# Patient Record
Sex: Male | Born: 1940 | Race: White | Hispanic: No | Marital: Married | Smoking: Never smoker
Health system: Southern US, Community
[De-identification: ages and names within clinical notes are randomized; demographics above are authoritative.]

## PROBLEM LIST (undated history)

## (undated) DIAGNOSIS — I2510 Atherosclerotic heart disease of native coronary artery without angina pectoris: Secondary | ICD-10-CM

## (undated) DIAGNOSIS — I517 Cardiomegaly: Secondary | ICD-10-CM

## (undated) DIAGNOSIS — K4020 Bilateral inguinal hernia, without obstruction or gangrene, not specified as recurrent: Secondary | ICD-10-CM

## (undated) DIAGNOSIS — B069 Rubella without complication: Secondary | ICD-10-CM

## (undated) DIAGNOSIS — C801 Malignant (primary) neoplasm, unspecified: Secondary | ICD-10-CM

## (undated) DIAGNOSIS — M1990 Unspecified osteoarthritis, unspecified site: Secondary | ICD-10-CM

## (undated) DIAGNOSIS — E785 Hyperlipidemia, unspecified: Secondary | ICD-10-CM

## (undated) DIAGNOSIS — I700 Atherosclerosis of aorta: Secondary | ICD-10-CM

## (undated) DIAGNOSIS — I499 Cardiac arrhythmia, unspecified: Secondary | ICD-10-CM

## (undated) DIAGNOSIS — M459 Ankylosing spondylitis of unspecified sites in spine: Secondary | ICD-10-CM

## (undated) DIAGNOSIS — I10 Essential (primary) hypertension: Secondary | ICD-10-CM

## (undated) DIAGNOSIS — K219 Gastro-esophageal reflux disease without esophagitis: Secondary | ICD-10-CM

## (undated) DIAGNOSIS — D126 Benign neoplasm of colon, unspecified: Secondary | ICD-10-CM

## (undated) DIAGNOSIS — B019 Varicella without complication: Secondary | ICD-10-CM

## (undated) DIAGNOSIS — G4730 Sleep apnea, unspecified: Secondary | ICD-10-CM

## (undated) DIAGNOSIS — N200 Calculus of kidney: Secondary | ICD-10-CM

## (undated) DIAGNOSIS — K449 Diaphragmatic hernia without obstruction or gangrene: Secondary | ICD-10-CM

## (undated) DIAGNOSIS — Z87442 Personal history of urinary calculi: Secondary | ICD-10-CM

## (undated) DIAGNOSIS — K8020 Calculus of gallbladder without cholecystitis without obstruction: Secondary | ICD-10-CM

## (undated) DIAGNOSIS — I4819 Other persistent atrial fibrillation: Secondary | ICD-10-CM

## (undated) DIAGNOSIS — J189 Pneumonia, unspecified organism: Secondary | ICD-10-CM

## (undated) HISTORY — DX: Calculus of gallbladder without cholecystitis without obstruction: K80.20

## (undated) HISTORY — DX: Ankylosing spondylitis of unspecified sites in spine: M45.9

## (undated) HISTORY — DX: Calculus of kidney: N20.0

## (undated) HISTORY — DX: Hyperlipidemia, unspecified: E78.5

## (undated) HISTORY — DX: Essential (primary) hypertension: I10

## (undated) HISTORY — PX: OTHER SURGICAL HISTORY: SHX169

## (undated) HISTORY — DX: Benign neoplasm of colon, unspecified: D12.6

## (undated) HISTORY — PX: UPPER GI ENDOSCOPY: SHX6162

## (undated) HISTORY — DX: Gastro-esophageal reflux disease without esophagitis: K21.9

## (undated) HISTORY — DX: Other persistent atrial fibrillation: I48.19

## (undated) HISTORY — PX: KNEE SURGERY: SHX244

## (undated) HISTORY — DX: Rubella without complication: B06.9

## (undated) HISTORY — DX: Cardiomegaly: I51.7

## (undated) HISTORY — PX: CHOLECYSTECTOMY: SHX55

## (undated) HISTORY — DX: Unspecified osteoarthritis, unspecified site: M19.90

## (undated) HISTORY — PX: ELBOW ARTHROPLASTY: SHX928

## (undated) HISTORY — DX: Varicella without complication: B01.9

## (undated) HISTORY — DX: Diaphragmatic hernia without obstruction or gangrene: K44.9

## (undated) HISTORY — PX: HAND SURGERY: SHX662

## (undated) HISTORY — DX: Sleep apnea, unspecified: G47.30

## (undated) HISTORY — PX: TONSILLECTOMY: SUR1361

---

## 1993-03-01 DIAGNOSIS — D126 Benign neoplasm of colon, unspecified: Secondary | ICD-10-CM

## 1993-03-01 HISTORY — DX: Benign neoplasm of colon, unspecified: D12.6

## 2001-03-18 ENCOUNTER — Inpatient Hospital Stay (HOSPITAL_COMMUNITY): Admission: EM | Admit: 2001-03-18 | Discharge: 2001-03-18 | Payer: Self-pay | Admitting: Emergency Medicine

## 2002-07-12 ENCOUNTER — Emergency Department (HOSPITAL_COMMUNITY): Admission: EM | Admit: 2002-07-12 | Discharge: 2002-07-12 | Payer: Self-pay

## 2003-04-22 ENCOUNTER — Encounter (INDEPENDENT_AMBULATORY_CARE_PROVIDER_SITE_OTHER): Payer: Self-pay | Admitting: *Deleted

## 2003-04-22 ENCOUNTER — Observation Stay (HOSPITAL_COMMUNITY): Admission: RE | Admit: 2003-04-22 | Discharge: 2003-04-23 | Payer: Self-pay | Admitting: Surgery

## 2003-08-13 ENCOUNTER — Emergency Department (HOSPITAL_COMMUNITY): Admission: EM | Admit: 2003-08-13 | Discharge: 2003-08-14 | Payer: Self-pay | Admitting: Emergency Medicine

## 2004-09-14 ENCOUNTER — Ambulatory Visit: Payer: Self-pay | Admitting: Family Medicine

## 2004-09-26 ENCOUNTER — Ambulatory Visit: Payer: Self-pay | Admitting: Family Medicine

## 2004-10-26 ENCOUNTER — Ambulatory Visit: Payer: Self-pay | Admitting: Family Medicine

## 2004-11-02 ENCOUNTER — Ambulatory Visit: Payer: Self-pay | Admitting: Family Medicine

## 2004-11-16 ENCOUNTER — Ambulatory Visit: Payer: Self-pay | Admitting: Family Medicine

## 2005-01-30 ENCOUNTER — Ambulatory Visit: Payer: Self-pay | Admitting: Family Medicine

## 2005-02-02 ENCOUNTER — Ambulatory Visit: Payer: Self-pay | Admitting: Cardiology

## 2005-04-12 ENCOUNTER — Observation Stay (HOSPITAL_COMMUNITY): Admission: EM | Admit: 2005-04-12 | Discharge: 2005-04-15 | Payer: Self-pay | Admitting: Emergency Medicine

## 2005-05-14 ENCOUNTER — Ambulatory Visit: Payer: Self-pay | Admitting: Family Medicine

## 2005-10-20 ENCOUNTER — Emergency Department (HOSPITAL_COMMUNITY): Admission: EM | Admit: 2005-10-20 | Discharge: 2005-10-20 | Payer: Self-pay | Admitting: Emergency Medicine

## 2005-10-22 ENCOUNTER — Ambulatory Visit: Payer: Self-pay | Admitting: Family Medicine

## 2005-11-08 ENCOUNTER — Ambulatory Visit: Payer: Self-pay | Admitting: Family Medicine

## 2006-02-04 ENCOUNTER — Ambulatory Visit: Payer: Self-pay | Admitting: Family Medicine

## 2006-03-14 ENCOUNTER — Emergency Department (HOSPITAL_COMMUNITY): Admission: EM | Admit: 2006-03-14 | Discharge: 2006-03-14 | Payer: Self-pay | Admitting: Emergency Medicine

## 2006-04-18 ENCOUNTER — Ambulatory Visit: Payer: Self-pay | Admitting: Family Medicine

## 2006-05-10 ENCOUNTER — Ambulatory Visit: Payer: Self-pay | Admitting: Family Medicine

## 2006-05-16 ENCOUNTER — Ambulatory Visit: Payer: Self-pay | Admitting: Family Medicine

## 2006-05-21 ENCOUNTER — Ambulatory Visit: Payer: Self-pay | Admitting: Licensed Clinical Social Worker

## 2006-05-28 ENCOUNTER — Ambulatory Visit: Payer: Self-pay | Admitting: Licensed Clinical Social Worker

## 2006-06-11 ENCOUNTER — Ambulatory Visit: Payer: Self-pay | Admitting: Licensed Clinical Social Worker

## 2006-06-18 ENCOUNTER — Ambulatory Visit: Payer: Self-pay | Admitting: Family Medicine

## 2006-08-21 DIAGNOSIS — R1012 Left upper quadrant pain: Secondary | ICD-10-CM

## 2006-08-29 ENCOUNTER — Ambulatory Visit: Payer: Self-pay | Admitting: Family Medicine

## 2006-08-29 LAB — CONVERTED CEMR LAB
Alkaline Phosphatase: 53 units/L (ref 39–117)
Amylase: 55 units/L (ref 27–131)
Basophils Absolute: 0.1 10*3/uL (ref 0.0–0.1)
Bilirubin, Direct: 0.1 mg/dL (ref 0.0–0.3)
Chloride: 103 meq/L (ref 96–112)
Eosinophils Absolute: 0.2 10*3/uL (ref 0.0–0.6)
GFR calc Af Amer: 56 mL/min
Glucose, Bld: 99 mg/dL (ref 70–99)
Hemoglobin: 16 g/dL (ref 13.0–17.0)
Lipase: 25 units/L (ref 11.0–59.0)
Lymphocytes Relative: 24.5 % (ref 12.0–46.0)
MCHC: 34.2 g/dL (ref 30.0–36.0)
Neutrophils Relative %: 65.4 % (ref 43.0–77.0)
Potassium: 4.2 meq/L (ref 3.5–5.1)
RBC: 5.48 M/uL (ref 4.22–5.81)
RDW: 12.9 % (ref 11.5–14.6)
Sodium: 144 meq/L (ref 135–145)

## 2006-09-02 ENCOUNTER — Ambulatory Visit: Payer: Self-pay | Admitting: Internal Medicine

## 2006-09-04 ENCOUNTER — Ambulatory Visit: Payer: Self-pay | Admitting: Family Medicine

## 2006-09-04 DIAGNOSIS — M549 Dorsalgia, unspecified: Secondary | ICD-10-CM | POA: Insufficient documentation

## 2006-09-04 DIAGNOSIS — M459 Ankylosing spondylitis of unspecified sites in spine: Secondary | ICD-10-CM | POA: Insufficient documentation

## 2006-10-07 ENCOUNTER — Ambulatory Visit: Payer: Self-pay | Admitting: Gastroenterology

## 2006-10-08 ENCOUNTER — Telehealth: Payer: Self-pay | Admitting: Family Medicine

## 2006-10-15 DIAGNOSIS — M25569 Pain in unspecified knee: Secondary | ICD-10-CM

## 2006-10-22 ENCOUNTER — Ambulatory Visit: Payer: Self-pay | Admitting: Family Medicine

## 2006-10-28 ENCOUNTER — Encounter: Payer: Self-pay | Admitting: Internal Medicine

## 2006-10-30 ENCOUNTER — Telehealth: Payer: Self-pay | Admitting: Family Medicine

## 2006-11-12 ENCOUNTER — Ambulatory Visit: Payer: Self-pay | Admitting: Gastroenterology

## 2006-11-12 ENCOUNTER — Encounter: Payer: Self-pay | Admitting: Gastroenterology

## 2007-04-25 DIAGNOSIS — G4730 Sleep apnea, unspecified: Secondary | ICD-10-CM | POA: Insufficient documentation

## 2007-04-25 DIAGNOSIS — K449 Diaphragmatic hernia without obstruction or gangrene: Secondary | ICD-10-CM | POA: Insufficient documentation

## 2007-04-25 DIAGNOSIS — M129 Arthropathy, unspecified: Secondary | ICD-10-CM | POA: Insufficient documentation

## 2007-04-25 DIAGNOSIS — I517 Cardiomegaly: Secondary | ICD-10-CM | POA: Insufficient documentation

## 2007-04-25 DIAGNOSIS — Z87442 Personal history of urinary calculi: Secondary | ICD-10-CM

## 2007-04-25 DIAGNOSIS — M461 Sacroiliitis, not elsewhere classified: Secondary | ICD-10-CM

## 2007-04-25 DIAGNOSIS — D126 Benign neoplasm of colon, unspecified: Secondary | ICD-10-CM | POA: Insufficient documentation

## 2007-04-25 DIAGNOSIS — Z8719 Personal history of other diseases of the digestive system: Secondary | ICD-10-CM

## 2007-04-25 DIAGNOSIS — I10 Essential (primary) hypertension: Secondary | ICD-10-CM | POA: Insufficient documentation

## 2007-04-25 HISTORY — DX: Personal history of other diseases of the digestive system: Z87.19

## 2007-04-25 HISTORY — DX: Personal history of urinary calculi: Z87.442

## 2007-05-13 ENCOUNTER — Telehealth (INDEPENDENT_AMBULATORY_CARE_PROVIDER_SITE_OTHER): Payer: Self-pay | Admitting: *Deleted

## 2007-05-15 ENCOUNTER — Ambulatory Visit: Payer: Self-pay | Admitting: Family Medicine

## 2007-06-27 ENCOUNTER — Telehealth: Payer: Self-pay | Admitting: Internal Medicine

## 2007-08-13 ENCOUNTER — Ambulatory Visit: Payer: Self-pay | Admitting: Family Medicine

## 2007-08-13 DIAGNOSIS — J209 Acute bronchitis, unspecified: Secondary | ICD-10-CM

## 2007-08-18 ENCOUNTER — Telehealth: Payer: Self-pay | Admitting: Internal Medicine

## 2007-08-19 ENCOUNTER — Encounter: Payer: Self-pay | Admitting: Internal Medicine

## 2007-08-19 DIAGNOSIS — R0602 Shortness of breath: Secondary | ICD-10-CM | POA: Insufficient documentation

## 2007-08-21 ENCOUNTER — Ambulatory Visit: Payer: Self-pay | Admitting: Internal Medicine

## 2007-11-29 ENCOUNTER — Telehealth: Payer: Self-pay | Admitting: Family Medicine

## 2007-12-02 ENCOUNTER — Telehealth: Payer: Self-pay | Admitting: Family Medicine

## 2008-05-05 ENCOUNTER — Ambulatory Visit: Payer: Self-pay | Admitting: Family Medicine

## 2008-05-05 DIAGNOSIS — F29 Unspecified psychosis not due to a substance or known physiological condition: Secondary | ICD-10-CM | POA: Insufficient documentation

## 2008-05-05 LAB — CONVERTED CEMR LAB
ALT: 15 units/L (ref 0–53)
AST: 21 units/L (ref 0–37)
Albumin: 3.4 g/dL — ABNORMAL LOW (ref 3.5–5.2)
BUN: 9 mg/dL (ref 6–23)
Basophils Absolute: 0 10*3/uL (ref 0.0–0.1)
Bilirubin, Direct: 0 mg/dL (ref 0.0–0.3)
CO2: 27 meq/L (ref 19–32)
Calcium: 8.5 mg/dL (ref 8.4–10.5)
Cholesterol: 165 mg/dL (ref 0–200)
GFR calc non Af Amer: 70.92 mL/min (ref >60)
Lymphocytes Relative: 20.7 % (ref 12.0–46.0)
Lymphs Abs: 1.9 10*3/uL (ref 0.7–4.0)
Monocytes Absolute: 0.6 10*3/uL (ref 0.1–1.0)
Neutro Abs: 6.6 10*3/uL (ref 1.4–7.7)
Neutrophils Relative %: 69.4 % (ref 43.0–77.0)
PSA: 2.86 ng/mL (ref 0.10–4.00)
Potassium: 3.3 meq/L — ABNORMAL LOW (ref 3.5–5.1)
RDW: 14.3 % (ref 11.5–14.6)
Total Protein: 6.2 g/dL (ref 6.0–8.3)
Triglycerides: 133 mg/dL (ref 0.0–149.0)
Uric Acid, Serum: 7.5 mg/dL (ref 4.0–7.8)

## 2008-05-06 ENCOUNTER — Ambulatory Visit: Payer: Self-pay | Admitting: Family Medicine

## 2008-05-06 DIAGNOSIS — F341 Dysthymic disorder: Secondary | ICD-10-CM | POA: Insufficient documentation

## 2008-05-07 ENCOUNTER — Telehealth: Payer: Self-pay | Admitting: Family Medicine

## 2008-06-21 ENCOUNTER — Ambulatory Visit: Payer: Self-pay | Admitting: Internal Medicine

## 2008-06-21 ENCOUNTER — Inpatient Hospital Stay (HOSPITAL_COMMUNITY): Admission: EM | Admit: 2008-06-21 | Discharge: 2008-06-23 | Payer: Self-pay | Admitting: Emergency Medicine

## 2008-09-10 ENCOUNTER — Ambulatory Visit: Payer: Self-pay | Admitting: Family Medicine

## 2008-09-13 LAB — CONVERTED CEMR LAB
AST: 19 units/L (ref 0–37)
Alkaline Phosphatase: 57 units/L (ref 39–117)
Basophils Relative: 0.1 % (ref 0.0–3.0)
CO2: 29 meq/L (ref 19–32)
Creatinine, Ser: 1.4 mg/dL (ref 0.4–1.5)
Eosinophils Relative: 2.7 % (ref 0.0–5.0)
Glucose, Bld: 93 mg/dL (ref 70–99)
HCT: 50.6 % (ref 39.0–52.0)
Hemoglobin: 16.8 g/dL (ref 13.0–17.0)
LDL Cholesterol: 106 mg/dL — ABNORMAL HIGH (ref 0–99)
Lymphocytes Relative: 29.2 % (ref 12.0–46.0)
MCV: 86.3 fL (ref 78.0–100.0)
Monocytes Absolute: 0.6 10*3/uL (ref 0.1–1.0)
Monocytes Relative: 6.6 % (ref 3.0–12.0)
Neutro Abs: 5.2 10*3/uL (ref 1.4–7.7)
Neutrophils Relative %: 61.4 % (ref 43.0–77.0)
RDW: 13.2 % (ref 11.5–14.6)
Sodium: 143 meq/L (ref 135–145)
TSH: 2.52 microintl units/mL (ref 0.35–5.50)
Total Bilirubin: 1.1 mg/dL (ref 0.3–1.2)
Total Protein: 7.1 g/dL (ref 6.0–8.3)
Triglycerides: 156 mg/dL — ABNORMAL HIGH (ref 0.0–149.0)

## 2008-09-23 ENCOUNTER — Telehealth: Payer: Self-pay | Admitting: Family Medicine

## 2008-10-01 ENCOUNTER — Ambulatory Visit: Payer: Self-pay | Admitting: Internal Medicine

## 2008-11-18 ENCOUNTER — Telehealth: Payer: Self-pay | Admitting: Family Medicine

## 2008-11-30 ENCOUNTER — Telehealth: Payer: Self-pay | Admitting: Family Medicine

## 2009-01-14 ENCOUNTER — Telehealth: Payer: Self-pay | Admitting: Family Medicine

## 2009-02-27 ENCOUNTER — Encounter: Payer: Self-pay | Admitting: Family Medicine

## 2009-03-02 ENCOUNTER — Ambulatory Visit: Payer: Self-pay | Admitting: Family Medicine

## 2009-03-02 DIAGNOSIS — L03119 Cellulitis of unspecified part of limb: Secondary | ICD-10-CM

## 2009-03-02 DIAGNOSIS — L02619 Cutaneous abscess of unspecified foot: Secondary | ICD-10-CM

## 2009-03-28 ENCOUNTER — Ambulatory Visit: Payer: Self-pay | Admitting: Family Medicine

## 2009-04-14 ENCOUNTER — Ambulatory Visit: Payer: Self-pay | Admitting: Family Medicine

## 2009-04-14 DIAGNOSIS — R946 Abnormal results of thyroid function studies: Secondary | ICD-10-CM

## 2009-04-14 LAB — CONVERTED CEMR LAB: TSH: 0.02 microintl units/mL — ABNORMAL LOW (ref 0.35–5.50)

## 2009-04-15 ENCOUNTER — Telehealth: Payer: Self-pay | Admitting: Family Medicine

## 2009-04-15 ENCOUNTER — Inpatient Hospital Stay (HOSPITAL_COMMUNITY): Admission: EM | Admit: 2009-04-15 | Discharge: 2009-04-17 | Payer: Self-pay | Admitting: Emergency Medicine

## 2009-05-06 ENCOUNTER — Telehealth: Payer: Self-pay | Admitting: Family Medicine

## 2009-05-10 ENCOUNTER — Ambulatory Visit: Payer: Self-pay | Admitting: Family Medicine

## 2009-05-10 DIAGNOSIS — E0590 Thyrotoxicosis, unspecified without thyrotoxic crisis or storm: Secondary | ICD-10-CM | POA: Insufficient documentation

## 2009-05-11 LAB — CONVERTED CEMR LAB
Basophils Absolute: 0 10*3/uL (ref 0.0–0.1)
CO2: 27 meq/L (ref 19–32)
Calcium: 10.8 mg/dL — ABNORMAL HIGH (ref 8.4–10.5)
Chloride: 108 meq/L (ref 96–112)
Eosinophils Absolute: 0.2 10*3/uL (ref 0.0–0.7)
Eosinophils Relative: 3.1 % (ref 0.0–5.0)
Free T4: 5.3 ng/dL — ABNORMAL HIGH (ref 0.6–1.6)
GFR calc non Af Amer: 58.31 mL/min (ref >60)
Glucose, Bld: 117 mg/dL — ABNORMAL HIGH (ref 70–99)
HCT: 42.4 % (ref 39.0–52.0)
Hemoglobin: 14.2 g/dL (ref 13.0–17.0)
Lymphs Abs: 2.3 10*3/uL (ref 0.7–4.0)
MCHC: 33.4 g/dL (ref 30.0–36.0)
Monocytes Absolute: 0.7 10*3/uL (ref 0.1–1.0)
Monocytes Relative: 12.5 % — ABNORMAL HIGH (ref 3.0–12.0)
Platelets: 177 10*3/uL (ref 150.0–400.0)
Potassium: 4.2 meq/L (ref 3.5–5.1)
RDW: 13.1 % (ref 11.5–14.6)
WBC: 5.4 10*3/uL (ref 4.5–10.5)

## 2009-05-13 ENCOUNTER — Telehealth: Payer: Self-pay | Admitting: Family Medicine

## 2009-05-16 ENCOUNTER — Ambulatory Visit: Payer: Self-pay | Admitting: Family Medicine

## 2009-05-16 DIAGNOSIS — R319 Hematuria, unspecified: Secondary | ICD-10-CM

## 2009-05-16 LAB — CONVERTED CEMR LAB
Bilirubin Urine: NEGATIVE
Glucose, Urine, Semiquant: NEGATIVE
INR: 5.6
Protein, U semiquant: 30
Prothrombin Time: 28.6 s
Specific Gravity, Urine: 1.02
Urobilinogen, UA: 0.2
WBC Urine, dipstick: NEGATIVE

## 2009-05-18 ENCOUNTER — Ambulatory Visit: Payer: Self-pay | Admitting: Family Medicine

## 2009-05-18 ENCOUNTER — Encounter (HOSPITAL_COMMUNITY): Admission: RE | Admit: 2009-05-18 | Discharge: 2009-08-02 | Payer: Self-pay | Admitting: Family Medicine

## 2009-05-19 ENCOUNTER — Telehealth: Payer: Self-pay | Admitting: Family Medicine

## 2009-06-01 DIAGNOSIS — R350 Frequency of micturition: Secondary | ICD-10-CM | POA: Insufficient documentation

## 2009-06-03 ENCOUNTER — Ambulatory Visit: Payer: Self-pay | Admitting: Family Medicine

## 2009-06-30 ENCOUNTER — Ambulatory Visit: Payer: Self-pay | Admitting: Family Medicine

## 2009-07-05 LAB — CONVERTED CEMR LAB
BUN: 20 mg/dL (ref 6–23)
CO2: 31 meq/L (ref 19–32)
GFR calc non Af Amer: 59.33 mL/min (ref >60)
T3, Free: 6.8 pg/mL — ABNORMAL HIGH (ref 2.3–4.2)

## 2009-11-10 ENCOUNTER — Ambulatory Visit: Payer: Self-pay | Admitting: Family Medicine

## 2009-11-22 ENCOUNTER — Telehealth: Payer: Self-pay | Admitting: Family Medicine

## 2010-01-02 ENCOUNTER — Ambulatory Visit: Payer: Self-pay | Admitting: Family Medicine

## 2010-01-04 ENCOUNTER — Telehealth: Payer: Self-pay | Admitting: Family Medicine

## 2010-01-16 ENCOUNTER — Telehealth: Payer: Self-pay | Admitting: Family Medicine

## 2010-02-15 ENCOUNTER — Telehealth: Payer: Self-pay | Admitting: Family Medicine

## 2010-03-02 NOTE — Assessment & Plan Note (Signed)
Summary: fu per doc/njr   History of Present Illness: [name] Woods is a 70 year old male comes in today for follow-up of hematuria.  He has underlying hyperthyroidism......... scan and uptake today.  We saw him two days ago with massive unprovoked, hematuria. his INR was elevated, therefore, we stopped his Coumadin.  His INR today is 1.8.  He states he still having hematuria.  Pulse is 70 and regular  Allergies: No Known Drug Allergies  Past History:  Past medical, surgical, family and social histories (including risk factors) reviewed for relevance to current acute and chronic problems.  Past Medical History: Reviewed history from 04/25/2007 and no changes required. 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 Cholecystectomy   Family History: Reviewed history and no changes required.  Social History: Reviewed history from 05/05/2008 and no changes required. Retired Married Alcohol use-no Drug use-no Regular exercise-no  Review of Systems      See HPI  Physical Exam  General:  Well-developed,well-nourished,in no acute distress; alert,appropriate and cooperative throughout examination Heart:  70 and regular   Impression & Recommendations:  Problem # 1:  HEMATURIA UNSPECIFIED (ICD-599.70) Assessment Improved  Complete Medication List: 1)  Cyclobenzaprine Hcl 10 Mg Tabs (Cyclobenzaprine hcl) .Marland Kitchen.. 1 qd 2)  Hydrocodone-acetaminophen 7.5-750 Mg  Tabs (Hydrocodone-acetaminophen) .... Take one tab three times a day 3)  Cardizem La 300 Mg Tb24 (Diltiazem hcl coated beads) .Marland Kitchen.. 1qd 4)  Zestril 20 Mg Tabs (Lisinopril) .Marland Kitchen.. 1qd 5)  Colchicine 0.6 Mg Tabs (Colchicine) .... Prn 6)  Cialis 20 Mg Tabs (Tadalafil) .Marland Kitchen.. 1 tab as needed 7)  Aspirin Ec 325 Mg Tbec (Aspirin) .... Take one tablet daily 8)  Ultram 50 Mg Tabs (Tramadol hcl) .Marland Kitchen.. 1-2 by mouth every 6 hours as needed pain 9)  Nadolol 20 Mg Tabs (Nadolol) .... One by mouth daily 10)  Celexa 20 Mg Tabs (Citalopram hydrobromide) .Marland Kitchen.. 1 tab @ bedtime 11)  Ativan 0.5 Mg Tabs (Lorazepam) .... Take 1 tablet by mouth two times a day 12)  Warfarin Sodium 5 Mg Tabs (Warfarin sodium) .... Take one tab once daily 13)  Multaq 400 Mg Tabs (Dronedarone hcl) .... Take one tab two times a day  Other Orders: Protime (82956OZ) Fingerstick (30865)  Patient Instructions: 1)  continue to not take your Coumadin.  I will call you and I get the report from your thyroid uptake and scan.  Laboratory Results   Blood Tests   Date/Time Recieved: May 18, 2009 8:24 AM  Date/Time Reported: May 18, 2009 8:24 AM   PT: 16.4 s   (Normal Range: 10.6-13.4)  INR: 1.8   (Normal Range: 0.88-1.12   Therap INR: 2.0-3.5) Comments: Wynona Canes, CMA  May 18, 2009 8:24 AM       ANTICOAGULATION RECORD PREVIOUS REGIMEN & LAB RESULTS Anticoagulation Diagnosis:  427.31,V58.61,V58.83 on  05/16/2009 Previous INR Goal Range:  2.0-3.0 on  05/16/2009 Previous INR:  5.6 on  05/16/2009 Previous Coumadin Dose(mg):  2.5mg  on m,w,f 5mg  other days on  05/16/2009   NEW REGIMEN & LAB RESULTS Current INR: 1.8 Regimen:   (no change)  MEDICATIONS CYCLOBENZAPRINE HCL 10 MG TABS (CYCLOBENZAPRINE HCL) 1 qd HYDROCODONE-ACETAMINOPHEN 7.5-750 MG  TABS (HYDROCODONE-ACETAMINOPHEN) take one tab three times a day CARDIZEM LA 300 MG  TB24 (DILTIAZEM HCL COATED BEADS) 1qd ZESTRIL 20 MG  TABS (LISINOPRIL) 1qd COLCHICINE 0.6 MG   TABS (COLCHICINE) prn CIALIS 20 MG  TABS (TADALAFIL) 1 tab as needed ASPIRIN EC 325 MG  TBEC (ASPIRIN) take one tablet daily ULTRAM 50 MG TABS (TRAMADOL HCL) 1-2 by mouth every 6 hours as needed pain NADOLOL 20 MG TABS (NADOLOL) one by mouth daily CELEXA 20 MG TABS (CITALOPRAM HYDROBROMIDE) 1 tab @ bedtime ATIVAN 0.5 MG TABS (LORAZEPAM) Take 1 tablet by mouth two times a day WARFARIN SODIUM 5 MG TABS (WARFARIN SODIUM) take one tab once daily MULTAQ 400 MG TABS (DRONEDARONE HCL) take one tab two times a day   Anticoagulation Visit Questionnaire      Coumadin dose missed/changed:  No      Abnormal Bleeding Symptoms:  No   Any diet changes including alcohol intake, vegetables or greens since the last visit:  No Any illnesses or hospitalizations since the last visit:  No Any signs of clotting since the last visit (including chest discomfort, dizziness, shortness of breath, arm tingling, slurred speech, swelling or redness in leg):  No   Appended Document: fu per doc/njr     Allergies: No Known Drug Allergies   Complete Medication List: 1)  Cyclobenzaprine Hcl 10 Mg Tabs (Cyclobenzaprine hcl) .Marland Kitchen.. 1 qd 2)  Hydrocodone-acetaminophen 7.5-750 Mg Tabs (Hydrocodone-acetaminophen) .... Take one tab three times a day 3)  Cardizem La 300 Mg Tb24 (Diltiazem hcl coated beads) .Marland Kitchen.. 1qd 4)  Zestril 20 Mg Tabs (Lisinopril) .Marland Kitchen.. 1qd 5)  Colchicine 0.6 Mg Tabs (Colchicine) .... Prn 6)  Cialis 20 Mg Tabs (Tadalafil) .Marland Kitchen.. 1 tab as needed 7)  Aspirin Ec 325 Mg Tbec (Aspirin) .... Take one tablet daily 8)  Ultram 50 Mg Tabs (Tramadol hcl) .Marland Kitchen.. 1-2 by mouth every 6 hours as needed pain 9)  Nadolol 20 Mg Tabs (Nadolol) .... One by mouth daily 10)  Celexa 20 Mg Tabs (Citalopram hydrobromide) .Marland Kitchen.. 1 tab @ bedtime 11)  Ativan 0.5 Mg Tabs (Lorazepam) .... Take 1 tablet by mouth two times a day 12)  Warfarin Sodium 5 Mg Tabs (Warfarin sodium) .... Take one tab once daily 13)  Multaq 400 Mg Tabs  (Dronedarone hcl) .... Take one tab two times a day  Other Orders: UA Dipstick w/o Micro (manual) (16109)   Laboratory Results   Urine Tests  Date/Time Received: May 18, 2009   Routine Urinalysis   Color: yellow Appearance: Clear Glucose: negative   (Normal Range: Negative) Bilirubin: negative   (Normal Range: Negative) Ketone: negative   (Normal Range: Negative) Spec. Gravity: 1.015   (Normal Range: 1.003-1.035) Blood: moderate   (Normal Range: Negative) pH: 6.0   (Normal Range: 5.0-8.0) Protein: negative   (Normal Range: Negative) Urobilinogen: 0.2   (Normal Range: 0-1) Nitrite: negative   (Normal Range: Negative) Leukocyte Esterace: negative   (Normal Range: Negative)    Comments: Kern Reap CMA Duncan Dull)  May 18, 2009 8:52 AM

## 2010-03-02 NOTE — Assessment & Plan Note (Signed)
Summary: 1 MONTH FUP//CCM   Vital Signs:  Patient profile:   70 year old male Weight:      186 pounds Temp:     98.0 degrees F oral BP sitting:   172 / 68  (left arm) Cuff size:   regular  Vitals Entered By: Kern Reap CMA Duncan Dull) (June 30, 2009 10:21 AM) CC: follow-up visit   CC:  follow-up visit.  History of Present Illness: [name] Woods is a 70 year old male, who comes in today for evaluation of 3 problems.  He had medication-induced hyperthyroidism.  He is currently asymptomatic feels well.  No rapid heart rate.  No anxiety or nervousness.  Will check thyroid studies today.  He has underlying hypertension.  We increased his lisinopril from 20 mg daily to 40 a month ago.  BP still running high 172/68 year 150/60 at home.  The paroxysmal atrial fibrillation is asymptomatic.  No arrhythmia.  He stopped his Coumadin.  He wants a refill on the Vicodin.  He takes one tablet 3 times a day because he has ankylosing spondylitis and chronic low back pain.  He states the medication does not make him sleepy  Allergies (verified): No Known Drug Allergies  Review of Systems      See HPI  Physical Exam  General:  Well-developed,well-nourished,in no acute distress; alert,appropriate and cooperative throughout examination Lungs:  Normal respiratory effort, chest expands symmetrically. Lungs are clear to auscultation, no crackles or wheezes. Heart:  Normal rate and regular rhythm. S1 and S2 normal without gallop, murmur, click, rub or other extra sounds.   Impression & Recommendations:  Problem # 1:  HYPERTHYROIDISM (ICD-242.90) Assessment Improved  The following medications were removed from the medication list:    Nadolol 20 Mg Tabs (Nadolol) ..... One by mouth daily  Orders: Venipuncture (16109) TLB-BMP (Basic Metabolic Panel-BMET) (80048-METABOL) TLB-TSH (Thyroid Stimulating Hormone) (84443-TSH) TLB-T4 (Thyrox), Free [id number]) TLB-T3, Free (Triiodothyronine)  (84481-T3FREE)  Problem # 2:  HYPERTENSION (ICD-401.9) Assessment: Deteriorated  The following medications were removed from the medication list:    Zestril 20 Mg Tabs (Lisinopril) .Marland Kitchen... 1qd    Nadolol 20 Mg Tabs (Nadolol) ..... One by mouth daily His updated medication list for this problem includes:    Cardizem La 300 Mg Tb24 (Diltiazem hcl coated beads) .Marland Kitchen... 1qd    Lisinopril 40 Mg Tabs (Lisinopril) .Marland Kitchen... 1 & 1/2 qam  Problem # 3:  ANKYLOSING SPONDYLITIS (ICD-720.0) Assessment: Unchanged  Complete Medication List: 1)  Cyclobenzaprine Hcl 10 Mg Tabs (Cyclobenzaprine hcl) .Marland Kitchen.. 1 qd 2)  Hydrocodone-acetaminophen 7.5-750 Mg Tabs (Hydrocodone-acetaminophen) .... Take one tab three times a day 3)  Cardizem La 300 Mg Tb24 (Diltiazem hcl coated beads) .Marland Kitchen.. 1qd 4)  Cialis 20 Mg Tabs (Tadalafil) .Marland Kitchen.. 1 tab as needed 5)  Aspirin Ec 325 Mg Tbec (Aspirin) .... Take one tablet daily 6)  Celexa 20 Mg Tabs (Citalopram hydrobromide) .Marland Kitchen.. 1 tab @ bedtime 7)  Ativan 0.5 Mg Tabs (Lorazepam) .... Take 1 tablet by mouth two times a day 8)  Warfarin Sodium 5 Mg Tabs (Warfarin sodium) .... Take one tab once daily 9)  Multaq 400 Mg Tabs (Dronedarone hcl) .... Take one tab two times a day 10)  Prednisone 20 Mg Tabs (Prednisone) .... Use as directed 11)  Lisinopril 40 Mg Tabs (Lisinopril) .Marland Kitchen.. 1 & 1/2 qam  Patient Instructions: 1)  continue your current medications.  I will call you on initial lab work back Prescriptions: HYDROCODONE-ACETAMINOPHEN 7.5-750 MG TABS (HYDROCODONE-ACETAMINOPHEN) take one tab three times  a day  #100 x 5   Entered and Authorized by:   Roderick Pee MD   Signed by:   Roderick Pee MD on 06/30/2009   Method used:   Print then Give to Patient   RxID:   [id number] HYDROCODONE-ACETAMINOPHEN 7.5-750 MG TABS (HYDROCODONE-ACETAMINOPHEN) take one tab three times a day  #100 x 5   Entered and Authorized by:   Roderick Pee MD   Signed by:   Roderick Pee MD on 06/30/2009    Method used:   Print then Give to Patient   RxID:   [id number] LISINOPRIL 40 MG TABS (LISINOPRIL) 1 & 1/2 qam  #150 x 3   Entered and Authorized by:   Roderick Pee MD   Signed by:   Roderick Pee MD on 06/30/2009   Method used:   Electronically to        [facility] [address] #[id number]* (retail)       [address] [place], [place]  [address]       Ph: [phone]       Fax: [phone]   RxID:   [id number] CARDIZEM LA 300 MG  TB24 (DILTIAZEM HCL COATED BEADS) 1qd  #100 x 3   Entered and Authorized by:   Roderick Pee MD   Signed by:   Roderick Pee MD on 06/30/2009   Method used:   Electronically to        [facility] [address] [facility] [address] #[id number]* (retail)       [address] [place], [place]  [address]       Ph: [phone]       Fax: [phone]   RxID:   [id number]

## 2010-03-02 NOTE — Assessment & Plan Note (Signed)
Summary: rapid weight loss/shaky/?thyroid problem/cjr   Vital Signs:  Patient profile:   70 year old male Weight:      195 pounds Temp:     97.7 degrees F oral BP sitting:   170 / 60  (left arm) Cuff size:   regular  Vitals Entered By: Kern Reap CMA Duncan Dull) (May 10, 2009 9:17 AM) CC: throid concerns, weight loss Is Patient Diabetic? No Pain Assessment Patient in pain? no        CC:  throid concerns and weight loss.  History of Present Illness: [name] Woods is a 70 year old male, who comes in today for evaluation of hyperthyroidism.  He was recently admitted to the hospital for atrial fibrillation.  At that time.  His TSH was .006.  It was felt to be a medication related phenomena.  Therefore, the Amioderone was discontinued.  He is still in regular rhythm, however, he complains of tremulous nervousness, and continue weight loss.  He is down to 195.  Allergies: No Known Drug Allergies  Past History:  Past medical, surgical, family and social histories (including risk factors) reviewed for relevance to current acute and chronic problems.  Past Medical History: Reviewed history from 04/25/2007 and no changes required. 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 Cholecystectomy   Family History: Reviewed history and no changes required.  Social History: Reviewed history from 05/05/2008 and no changes required. Retired Married Alcohol  use-no Drug use-no Regular exercise-no  Review of Systems      See HPI  Physical Exam  General:  Well-developed,well-nourished,in no acute distress; alert,appropriate and cooperative throughout examination Neck:  No deformities, masses, or tenderness noted. Lungs:  Normal respiratory effort, chest expands symmetrically. Lungs are clear to auscultation, no crackles or wheezes. Heart:  Normal rate and regular rhythm. S1 and S2 normal without gallop, murmur, click, rub or other extra sounds.   Impression & Recommendations:  Problem # 1:  HYPERTHYROIDISM (ICD-242.90) Assessment New  His updated medication list for this problem includes:    Nadolol 20 Mg Tabs (Nadolol) ..... One by mouth daily  Orders: Venipuncture (16109) TLB-BMP (Basic Metabolic Panel-BMET) (80048-METABOL) TLB-CBC Platelet - w/Differential (85025-CBCD) TLB-TSH (Thyroid Stimulating Hormone) (84443-TSH) TLB-T4 (Thyrox), Free [phone]) TLB-T3, Free (Triiodothyronine) (84481-T3FREE)  Complete Medication List: 1)  Cyclobenzaprine Hcl 10 Mg Tabs (Cyclobenzaprine hcl) .Marland Kitchen.. 1 qd 2)  Hydrocodone-acetaminophen 7.5-750 Mg Tabs (Hydrocodone-acetaminophen) .... Take one tab three times a day 3)  Cardizem La 300 Mg Tb24 (Diltiazem hcl coated beads) .Marland Kitchen.. 1qd 4)  Zestril 20 Mg Tabs (Lisinopril) .Marland Kitchen.. 1qd 5)  Colchicine 0.6 Mg Tabs (Colchicine) .... Prn 6)  Cialis 20 Mg Tabs (Tadalafil) .Marland Kitchen.. 1 tab as needed 7)  Aspirin Ec 325 Mg Tbec (Aspirin) .... Take one tablet daily 8)  Ultram 50 Mg Tabs (Tramadol hcl) .Marland Kitchen.. 1-2 by mouth every 6 hours as needed pain 9)  Nadolol 20 Mg Tabs (Nadolol) .... One by mouth daily 10)  Celexa 20 Mg Tabs (Citalopram hydrobromide) .Marland Kitchen.. 1 tab @ bedtime 11)  Ativan 0.5 Mg Tabs (Lorazepam) .... Take 1 tablet by mouth two times a day 12)  Warfarin Sodium 5 Mg Tabs (Warfarin sodium) .... Take one tab once  daily 13)  Multaq 400 Mg Tabs (Dronedarone hcl) .... Take one tab two times a day  Patient  Instructions: 1)  continue current medications. 2)  Once we get your blood tests results, then we will call you and get set up for further studies

## 2010-03-02 NOTE — Assessment & Plan Note (Signed)
Summary: acute knee pain//alp   Vital Signs:  Patient profile:   70 year old male Height:      72 inches Weight:      201 pounds BMI:     27.36 Temp:     97.9 degrees F oral BP sitting:   140 / 78  (left arm) Cuff size:   regular  Vitals Entered By: Kern Reap CMA Duncan Dull) (January 02, 2010 1:05 PM) CC: right knee pain Is Patient Diabetic? No Pain Assessment Patient in pain? yes     Location: knee   CC:  right knee pain.  History of Present Illness: [name] Woods is a 70 year old, married male, nonsmoker, who comes in today for evaluation of pain in his right knee for 3 days.  He has had two previous knee surgeries on that knee for torn cartilages.  He played football at [employer]  Allergies: No Known Drug Allergies  Past History:  Past medical, surgical, family and social histories (including risk factors) reviewed for relevance to current acute and chronic problems.  Past Medical History: Reviewed history from 04/25/2007 and no changes required. 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 Cholecystectomy   Family History: Reviewed history and no changes required.  Social History: Reviewed history from 05/05/2008 and no changes required. Retired Married Alcohol use-no Drug use-no Regular exercise-no  Review of Systems      See HPI  Physical Exam  General:  Well-developed,well-nourished,in no acute distress;  alert,appropriate and cooperative throughout examination Msk:  the right knee is swollen and numb, and he able to extend it to pain on the lateral portion of the knee at the joint cartilage   Impression & Recommendations:  Problem # 1:  KNEE PAIN, RIGHT, ACUTE (ICD-719.46) Assessment New  His updated medication list for this problem includes:    Cyclobenzaprine Hcl 10 Mg Tabs (Cyclobenzaprine hcl) .Marland Kitchen... 1 qd    Hydrocodone-acetaminophen 7.5-750 Mg Tabs (Hydrocodone-acetaminophen) .Marland Kitchen... Take one  tab by mouth three times a day as needed for severe pain    Aspirin Ec 325 Mg Tbec (Aspirin) .Marland Kitchen... Take one tablet daily  Complete Medication List: 1)  Cyclobenzaprine Hcl 10 Mg Tabs (Cyclobenzaprine hcl) .Marland Kitchen.. 1 qd 2)  Hydrocodone-acetaminophen 7.5-750 Mg Tabs (Hydrocodone-acetaminophen) .... Take one  tab by mouth three times a day as needed for severe pain 3)  Cardizem La 300 Mg Tb24 (Diltiazem hcl coated beads) .Marland Kitchen.. 1qd 4)  Cialis 20 Mg Tabs (Tadalafil) .Marland Kitchen.. 1 tab as needed 5)  Aspirin Ec 325 Mg Tbec (Aspirin) .... Take one tablet daily 6)  Ativan 0.5 Mg Tabs (Lorazepam) .... Take 1 tablet by mouth two times a day 7)  Multaq 400 Mg Tabs (Dronedarone hcl) .... Take one tab two times a day 8)  Lisinopril 40 Mg Tabs (Lisinopril) .Marland Kitchen.. 1 & 1/2 qam  Patient Instructions: 1)  elevation and ice for 30 minutes 4 times a day.  Also, use the crutches as needed.  Also, the pain pills one half to one tablet 4 times a day as needed for severe pain. 2)  Call [facility] and asked to be seen by Dr. Cleophas Dunker, or Dr. Madelon Lips, or Dr.  Lucey. this week Prescriptions: HYDROCODONE-ACETAMINOPHEN 7.5-750 MG TABS (HYDROCODONE-ACETAMINOPHEN) take one  tab by mouth three times a day as needed for severe pain  #100 x 1   Entered and Authorized by:   Roderick Pee MD   Signed by:   Roderick Pee MD on 01/02/2010   Method used:   Print then Give to Patient   RxID:   [id number]    Orders Added: 1)  Est. Patient  Level III [56213]

## 2010-03-02 NOTE — Progress Notes (Signed)
Summary:  fyi  Phone Note Call from Patient Call back at [phone]   Caller: Patient Call For: Roderick Pee MD Summary of Call: pt would like lab results Initial call taken by: Heron Sabins,  April 15, 2009 12:58 PM  Follow-up for Phone Call        Patient advised to call [phone] Follow-up by: Kern Reap CMA Duncan Dull),  April 15, 2009 3:23 PM

## 2010-03-02 NOTE — Letter (Signed)
Summary: [facility]  [facility]   Imported By: Maryln Gottron 03/01/2009 13:45:33  _____________________________________________________________________  External Attachment:    Type:   Image     Comment:   External Document

## 2010-03-02 NOTE — Progress Notes (Signed)
Summary: fyi  Phone Note Call from Patient Call back at [facility] Phone [phone]   Caller: Patient Summary of Call: Pt called req to get lab results. Please call back asap.  Initial call taken by: Lucy Antigua,  May 13, 2009 8:16 AM  Follow-up for Phone Call         please call lab tests confirm hyperthyroidism, when is  his scan and uptake scheduled for???????????? Follow-up by: Roderick Pee MD,  May 13, 2009 10:39 AM  Additional Follow-up for Phone Call Additional follow up Details #1::        left message on machine for patient about lab results. patient is also aware of appointment for thyroid scan/uptake. Additional Follow-up by: Kern Reap CMA Duncan Dull),  May 13, 2009 10:45 AM

## 2010-03-02 NOTE — Assessment & Plan Note (Signed)
Summary: TSH ELEVATED / WT LOSS CONCERNS //RS rsc appt time/njr   Vital Signs:  Patient profile:   70 year old male Weight:      199 pounds Temp:     98.0 degrees F Pulse rate:   68 / minute Pulse rhythm:   irregular BP sitting:   160 / 80  (left arm) Cuff size:   regular  Vitals Entered By: Kern Reap CMA Duncan Dull) (April 14, 2009 9:03 AM)  Reason for Visit weight loss and lab concerns  History of Present Illness: [name] Woods is a 70 year old male, who comes in today accompanied by his son for evaluation of abnormal lab tests.  He brings laboratory data from Dr. Gery Pray his cardiologist.  TSH is .[phone].  Previous TSH by me in August was normal at 2.5 2............. the only change in his medication is that they have increased his amioderone .  He feels shaky nervous and is lost a few pounds.  Review of systems otherwise negative.  Cardiac-wise, stable.  Pulse 68 and irregularly, irregular  Allergies: No Known Drug Allergies  Past History:  Past medical, surgical, family and social histories (including risk factors) reviewed for relevance to current acute and chronic problems.  Past Medical History: Reviewed history from 04/25/2007 and no changes required. 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 Cholecystectomy   Family History: Reviewed history and no changes required.  Social History: Reviewed history from 05/05/2008 and no changes  required. Retired Married Alcohol use-no Drug use-no Regular exercise-no  Review of Systems      See HPI  Physical Exam  General:  Well-developed,well-nourished,in no acute distress; alert,appropriate and cooperative throughout examination Neck:  No deformities, masses, or tenderness noted.   Problems:  Medical Problems Added: 1)  Dx of Thyroid Function Test, Abnormal  (ICD-794.5)  Impression & Recommendations:  Problem # 1:  THYROID FUNCTION TEST, ABNORMAL (ICD-794.5) Assessment New  Orders: Venipuncture (09604) TLB-TSH (Thyroid Stimulating Hormone) (84443-TSH) TLB-T4 (Thyrox), Free [phone]) TLB-T3, Free (Triiodothyronine) (84481-T3FREE)  Complete Medication List: 1)  Cyclobenzaprine Hcl 10 Mg Tabs (Cyclobenzaprine hcl) .Marland Kitchen.. 1 qd 2)  Hydrocodone-acetaminophen 7.5-750 Mg Tabs (Hydrocodone-acetaminophen) .... Take one tab three times a day 3)  Cardizem La 300 Mg Tb24 (Diltiazem hcl coated beads) .Marland Kitchen.. 1qd 4)  Amiodarone Hcl 200 Mg Tabs (Amiodarone hcl) .... 1/2 tab by mouth daily 5)  Zestril 20 Mg Tabs (Lisinopril) .Marland Kitchen.. 1qd 6)  Colchicine 0.6 Mg Tabs (Colchicine) .... Prn 7)  Cialis 20 Mg Tabs (Tadalafil) .Marland Kitchen.. 1 tab as needed 8)  Aspirin Ec 325 Mg Tbec (Aspirin) .... Take one tablet daily 9)  Ultram 50 Mg Tabs (Tramadol hcl) .Marland Kitchen.. 1-2 by mouth every 6 hours as needed pain 10)  Nadolol 20 Mg Tabs (Nadolol) .... One by mouth daily 11)  Celexa 20 Mg Tabs (Citalopram hydrobromide) .Marland Kitchen.. 1 tab @ bedtime 12)  Ativan 0.5 Mg Tabs (Lorazepam) .... Take 1 tablet by mouth two times a day 13)  Warfarin Sodium 5 Mg Tabs (Warfarin sodium) .... Take one tab once daily  Patient Instructions: 1)  I will call you I get your lab work back

## 2010-03-02 NOTE — Assessment & Plan Note (Signed)
Summary: FLU SHOT//SLM   Nurse Visit   Allergies: No Known Drug Allergies  Orders Added: 1)  Flu Vaccine 18yrs + MEDICARE PATIENTS [Q2039] 2)  Administration Flu vaccine - MCR [G0008]  Flu Vaccine Consent Questions     Do you have a history of severe allergic reactions to this vaccine? no    Any prior history of allergic reactions to egg and/or gelatin? no    Do you have a sensitivity to the preservative Thimersol? no    Do you have a past history of Guillan-Barre Syndrome? no    Do you currently have an acute febrile illness? no    Have you ever had a severe reaction to latex? no    Vaccine information given and explained to patient? yes    Are you currently pregnant? no    Lot Number:[id number]   Exp Date:07/29/2010   Site Given  Left Deltoid IM Romualdo Bolk, CMA Duncan Dull)  November 10, 2009 2:35 PM

## 2010-03-02 NOTE — Progress Notes (Signed)
Summary: Pt said pharmacy is sending over req for alt med to [facility]  Phone Note Call from Patient Call back at Work Phone [phone]   Caller: Patient Summary of Call: Pt said that Cardizem 300mg  has become too expensive. Pt says his pharmacy is sending over a req for a cheaper alternative med, that Dr. Tawanna Cooler would have to approve. Pt just wanted to make Dr Tawanna Cooler aware.  Initial call taken by: Lucy Antigua,  February 15, 2010 10:48 AM  Follow-up for Phone Call        Fleet Contras please call to clarify the issue Follow-up by: Roderick Pee MD,  February 16, 2010 9:26 AM  Additional Follow-up for Phone Call Additional follow up Details #1::        patient spoke with [facility] and the doctor changed his rx Additional Follow-up by: Kern Reap CMA Duncan Dull),  February 16, 2010 2:15 PM

## 2010-03-02 NOTE — Assessment & Plan Note (Signed)
Summary: follow up thyroid - rv   Vital Signs:  Patient profile:   70 year old male Weight:      192 pounds Temp:     98.0 degrees F oral BP sitting:   160 / 68  (left arm) Cuff size:   regular  Vitals Entered By: Kern Reap CMA Duncan Dull) (Jun 03, 2009 10:28 AM) CC: follow-up visit   CC:  follow-up visit.  History of Present Illness: [name] Woods is a 70 year old, married male, nonsmoker comes in today for evaluation of 3 problems.  He developed amiordione induced hyperthyroidism.  He's been off his medication for 4 weeks and feels a lot better.  Pulses dropped to 70.  It was 120.  His blood pressure has gone up with the hyperthyroidism, but is not dropped down since his hyperthyroidism has stabilized.  BP 160/68.  He takes Cardizem 300 mg daily, Zestril 20 mg daily and Corgard 20 mg daily.  He says his pulse as low as 56.  Will therefore increase the Zestril to 40 mg daily.  Goal is to get a systolic less than 135.  The last two to 3 days. he has had  some urgency of urination.  He said no fever, chills, back pain, etc.  Allergies: No Known Drug Allergies  Past History:  Past medical, surgical, family and social histories (including risk factors) reviewed for relevance to current acute and chronic problems.  Past Medical History: Reviewed history from 04/25/2007 and no changes required. 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 Cholecystectomy    Family History: Reviewed history and no changes required.  Social History: Reviewed history from 05/05/2008 and no changes required. Retired Married Alcohol use-no Drug use-no Regular exercise-no  Review of Systems      See HPI  Physical Exam  General:  Well-developed,well-nourished,in no acute distress; alert,appropriate and cooperative throughout examination Heart:  Normal rate and regular rhythm. S1 and S2 normal without gallop, murmur, click, rub or other extra sounds.   Impression & Recommendations:  Problem # 1:  HYPERTHYROIDISM (ICD-242.90) Assessment Improved  His updated medication list for this problem includes:    Nadolol 20 Mg Tabs (Nadolol) ..... One by mouth daily  Orders: Prescription Created Electronically [phone])  Problem # 2:  HYPERTENSION (ICD-401.9) Assessment: Deteriorated  His updated medication list for this problem includes:    Cardizem La 300 Mg Tb24 (Diltiazem hcl coated beads) .Marland Kitchen... 1qd    Zestril 20 Mg Tabs (Lisinopril) .Marland Kitchen... 1qd    Nadolol 20 Mg Tabs (Nadolol) ..... One by mouth daily    Lisinopril 40 Mg Tabs (Lisinopril) .Marland Kitchen... Take 1 tablet by mouth every morning  Orders: Prescription Created Electronically [phone])  Problem # 3:  FREQUENCY, URINARY (ICD-788.41) Assessment: New  Orders: Prescription Created Electronically ([phone])  Complete Medication List: 1)  Cyclobenzaprine Hcl 10 Mg Tabs (Cyclobenzaprine hcl) .Marland Kitchen.. 1 qd 2)  Hydrocodone-acetaminophen 7.5-750 Mg Tabs (Hydrocodone-acetaminophen) .... Take one tab three times a day 3)  Cardizem La 300 Mg Tb24 (Diltiazem hcl coated beads) .Marland Kitchen.. 1qd 4)  Zestril 20 Mg Tabs (Lisinopril) .Marland Kitchen.. 1qd 5)  Colchicine 0.6 Mg Tabs (Colchicine) .... Prn 6)  Cialis 20 Mg Tabs (  Tadalafil) .Marland Kitchen.. 1 tab as needed 7)  Aspirin Ec 325 Mg Tbec (Aspirin) .... Take one tablet daily 8)  Ultram 50 Mg Tabs (Tramadol hcl) .Marland Kitchen.. 1-2 by mouth every 6 hours as needed pain 9)  Nadolol 20 Mg Tabs (Nadolol) .... One  by mouth daily 10)  Celexa 20 Mg Tabs (Citalopram hydrobromide) .Marland Kitchen.. 1 tab @ bedtime 11)  Ativan 0.5 Mg Tabs (Lorazepam) .... Take 1 tablet by mouth two times a day 12)  Warfarin Sodium 5 Mg Tabs (Warfarin sodium) .... Take one tab once daily 13)  Multaq 400 Mg Tabs (Dronedarone hcl) .... Take one tab two times a day 14)  Prednisone 20 Mg Tabs (Prednisone) .... Use as directed 15)  Doxycycline Hyclate 100 Mg Caps (Doxycycline hyclate) .... Take 1 tablet by mouth two times a day 16)  Lisinopril 40 Mg Tabs (Lisinopril) .... Take 1 tablet by mouth every morning  Patient Instructions: 1)  increase the Zestril to 40 mg daily.  Check a morning blood pressure daily.  If after 3 weeks if systolic is less than 135, then continue that dose........ if not call us. 2)  Drink lots of water.  Avoid caffeinated beverages.  I will give you a prescription for doxycycline to take twice a day for 3 weeks if the urinary symptoms get worse 3)  Please schedule a follow-up appointment in 1 month. Prescriptions: LISINOPRIL 40 MG TABS (LISINOPRIL) Take 1 tablet by mouth every morning  #100 x 3   Entered and Authorized by:   Roderick Pee MD   Signed by:   Roderick Pee MD on 06/03/2009   Method used:   Electronically to        [facility] [address] #[id number]* (retail)       [address] [place], [place]  [address]       Ph: [phone]       Fax: [phone]   RxID:   [id number] DOXYCYCLINE HYCLATE 100 MG CAPS (DOXYCYCLINE HYCLATE) Take 1 tablet by mouth two times a day  #50 x 1   Entered and Authorized by:   Roderick Pee MD   Signed by:   Roderick Pee MD on 06/03/2009   Method used:   Print then Give to Patient   RxID:   [id number]

## 2010-03-02 NOTE — Progress Notes (Signed)
  Phone Note Outgoing Call   Summary of Call: O. his cardiologist, Dr. Allyson Sabal with a numbers......... abnormal labs, most likely secondary to cardiac medication.  Advise cardiologist, who advised patient how to change medicines Initial call taken by: Roderick Pee MD,  April 15, 2009 1:24 PM

## 2010-03-02 NOTE — Assessment & Plan Note (Signed)
Summary: urinating blood/cjr   Vital Signs:  Patient profile:   70 year old male Weight:      192 pounds Temp:     97.9 degrees F oral BP sitting:   164 / 60  (left arm) Cuff size:   regular  Vitals Entered By: Kern Reap CMA [name] Woods, [name] 10:Woods AM) CC: hematuria   CC:  hematuria.  History of Present Illness: [name] Woods is a 70 year old male, who has hyperthyroidism, who comes in today for evaluation of painless hematuria that started this morning.  He said he kidney stone in the past, however, with the kidney stone.  He had pain.  Now he has no pain.  He has no fever, chills, or back pain.  He is also on Coumadin because of atrial fibrillation.  Will check protime today also.  protime is 5.6, therefore, I think he is bleeding because of the Coumadin not a stone  Allergies: No Known Drug Allergies  Past History:  Past medical, surgical, family and social histories (including risk factors) reviewed for relevance to current acute and chronic problems.  Past Medical History: Reviewed history from 04/25/2007 and no changes required. 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 Cholecystectomy   Family History: Reviewed history and no changes required.  Social History: Reviewed history from 05/05/2008 and no changes required. Retired Married Alcohol use-no Drug use-no Regular exercise-no  Review of Systems      See  HPI  Physical Exam  General:  Well-developed,well-nourished,in no acute distress; alert,appropriate and cooperative throughout examination   Impression & Recommendations:  Problem # 1:  HEMATURIA UNSPECIFIED (ICD-599.70) Assessment New  Orders: UA Dipstick w/o Micro (automated)  (81003) Fingerstick (74259) Protime (56387FI)  Complete Medication List: 1)  Cyclobenzaprine Hcl 10 Mg Tabs (Cyclobenzaprine hcl) .Marland Kitchen.. 1 qd 2)  Hydrocodone-acetaminophen 7.5-750 Mg Tabs (Hydrocodone-acetaminophen) .... Take one tab three times a day 3)  Cardizem La 300 Mg Tb24 (Diltiazem hcl coated beads) .Marland Kitchen.. 1qd 4)  Zestril 20 Mg Tabs (Lisinopril) .Marland Kitchen.. 1qd 5)  Colchicine 0.6 Mg Tabs (Colchicine) .... Prn 6)  Cialis 20 Mg Tabs (Tadalafil) .Marland Kitchen.. 1 tab as needed 7)  Aspirin Ec 325 Mg Tbec (Aspirin) .... Take one tablet daily 8)  Ultram 50 Mg Tabs (Tramadol hcl) .Marland Kitchen.. 1-2 by mouth every 6 hours as needed pain 9)  Nadolol 20 Mg Tabs (Nadolol) .... One by mouth daily 10)  Celexa 20 Mg Tabs (Citalopram hydrobromide) .Marland Kitchen.. 1 tab @ bedtime 11)  Ativan 0.5 Mg Tabs (Lorazepam) .... Take 1 tablet by mouth two times a day 12)  Warfarin Sodium 5 Mg Tabs (Warfarin sodium) .... Take one tab once daily 13)  Multaq 400 Mg Tabs (Dronedarone hcl) .... Take one tab two times a day  Patient Instructions: 1)  do not take any Coumadin.  Return Wednesday morning, 815 for follow  Laboratory Results   Urine Tests  Date/Time Received: [name], [name]   Routine Urinalysis   Color: red Appearance: Cloudy Glucose: negative   (Normal Range: Negative) Bilirubin: negative   (Normal Range: Negative) Ketone: negative   (Normal Range: Negative) Spec. Gravity: 1.020   (Normal Range: 1.003-1.035) Blood: large   (  Normal Range: Negative) pH: 5.5   (Normal Range: 5.0-8.0) Protein: 30   (Normal Range: Negative) Urobilinogen: 0.2   (Normal Range: 0-1) Nitrite: negative   (Normal Range: Negative) Leukocyte Esterace: negative   (Normal  Range: Negative)    Comments: Kern Reap CMA [name] Dull)  [name], [name] 10:26 AM   Blood Tests   Date/Time Received: [name], [name] 10:45 AM  Date/Time Reported: [name], [name] 10:45 AM   PT: 28.6 s   (Normal Range: 10.6-13.4)  INR: 5.6   (Normal Range: 0.88-1.12   Therap INR: 2.0-3.5) Comments: Wynona Canes, CMA  [name], [name] 10:45 AM     Laboratory Results   Urine Tests    Routine Urinalysis   Color: red Appearance: Cloudy Glucose: negative   (Normal Range: Negative) Bilirubin: negative   (Normal Range: Negative) Ketone: negative   (Normal Range: Negative) Spec. Gravity: 1.020   (Normal Range: 1.003-1.035) Blood: large   (Normal Range: Negative) pH: 5.5   (Normal Range: 5.0-8.0) Protein: 30   (Normal Range: Negative) Urobilinogen: 0.2   (Normal Range: 0-1) Nitrite: negative   (Normal Range: Negative) Leukocyte Esterace: negative   (Normal Range: Negative)    Comments: Kern Reap CMA (AAMA)  [name], [name] 10:26 AM   Blood Tests     PT: 28.6 s   (Normal Range: 10.6-13.4)  INR: 5.6   (Normal Range: 0.88-1.12   Therap INR: 2.0-3.5) Comments: Wynona Canes, CMA  [name], [name] 10:45 AM        ANTICOAGULATION RECORD       NEW REGIMEN & LAB RESULTS Anticoag. Dx: 427.31,V58.61,V58.83 Current INR Goal Range: 2.0-3.0 Current INR: 5.6 Current Coumadin Dose(mg): 2.5mg  on m,w,f 5mg  other days Regimen:   (no change)  MEDICATIONS CYCLOBENZAPRINE HCL 10 MG TABS (CYCLOBENZAPRINE HCL) 1 qd HYDROCODONE-ACETAMINOPHEN 7.5-750 MG TABS (HYDROCODONE-ACETAMINOPHEN) take one tab three times a day CARDIZEM LA 300 MG  TB24 (DILTIAZEM HCL COATED BEADS) 1qd ZESTRIL 20 MG  TABS (LISINOPRIL) 1qd COLCHICINE 0.6 MG  TABS (COLCHICINE) prn CIALIS 20 MG  TABS (TADALAFIL) 1 tab as needed ASPIRIN EC 325 MG  TBEC (ASPIRIN) take one tablet daily ULTRAM 50 MG TABS (TRAMADOL HCL) 1-2 by mouth every 6 hours as needed pain NADOLOL 20 MG TABS (NADOLOL) one by mouth  daily CELEXA 20 MG TABS (CITALOPRAM HYDROBROMIDE) 1 tab @ bedtime ATIVAN 0.5 MG TABS (LORAZEPAM) Take 1 tablet by mouth two times a day WARFARIN SODIUM 5 MG TABS (WARFARIN SODIUM) take one tab once daily MULTAQ 400 MG TABS (DRONEDARONE HCL) take one tab two times a day

## 2010-03-02 NOTE — Assessment & Plan Note (Signed)
Summary: ingrown toenail//ccm   Vital Signs:  Patient profile:   70 year old male Height:      72 inches Weight:      208 pounds BMI:     28.31 Temp:     98.6 degrees F oral BP sitting:   160 / 70  (left arm) Cuff size:   regular  Vitals Entered By: Kern Reap CMA Duncan Dull) (March 02, 2009 10:55 AM)  Reason for Visit left ingrown toenail  History of Present Illness: [name] Woods is a 70 year old, married man nonsmoker, who comes in today for evaluation of a left great toenail infection.  He has fungus in the nail in about a week to 10 days ago.  He noticed some redness on the medial portion.  The no fever, chills, no draining pus  Allergies: No Known Drug Allergies  Past History:  Past medical, surgical, family and social histories (including risk factors) reviewed for relevance to current acute and chronic problems.  Past Medical History: Reviewed history from 04/25/2007 and no changes required. 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 Cholecystectomy   Family History: Reviewed history and no changes required.  Social History: Reviewed history from 05/05/2008 and no changes required. Retired Married Alcohol use-no Drug use-no Regular exercise-no  Review of Systems      See HPI  Physical Exam  General:  Well-developed,well-nourished,in no acute distress; alert,appropriate and cooperative throughout examination Skin:  the medial  portion of the left great toe shows erythema, no pus, slightly tender   Problems:  Medical Problems Added: 1)  Dx of Cellulitis, Foot  (ICD-682.7)  Impression & Recommendations:  Problem # 1:  CELLULITIS, FOOT (ICD-682.7) Assessment New  His updated medication list for this problem includes:    Keflex 500 Mg Caps (Cephalexin) .Marland Kitchen... 2 by mouth two times a day  Orders: Prescription Created Electronically [phone])  Complete Medication List: 1)  Cyclobenzaprine Hcl 10 Mg Tabs (Cyclobenzaprine hcl) .Marland Kitchen.. 1 qd 2)  Hydrocodone-acetaminophen 7.5-750 Mg Tabs (Hydrocodone-acetaminophen) .... Take one tab three times a day 3)  Cardizem La 300 Mg Tb24 (Diltiazem hcl coated beads) .Marland Kitchen.. 1qd 4)  Amiodarone Hcl 200 Mg Tabs (Amiodarone hcl) .... 1/2 tab by mouth daily 5)  Zestril 20 Mg Tabs (Lisinopril) .Marland Kitchen.. 1qd 6)  Colchicine 0.6 Mg Tabs (Colchicine) .... Prn 7)  Cialis 20 Mg Tabs (Tadalafil) .Marland Kitchen.. 1 tab as needed 8)  Aspirin Ec 325 Mg Tbec (Aspirin) .... Take one tablet daily 9)  Ultram 50 Mg Tabs (Tramadol hcl) .Marland Kitchen.. 1-2 by mouth every 6 hours as needed pain 10)  Nadolol 20 Mg Tabs (Nadolol) .... One by mouth daily 11)  Celexa 20 Mg Tabs (Citalopram hydrobromide) .Marland Kitchen.. 1 tab @ bedtime 12)  Ativan 0.5 Mg Tabs (Lorazepam) .... Take 1 tablet by mouth two times a day 13)  Keflex 500 Mg Caps (Cephalexin) .... 2 by mouth two times a day  Patient Instructions: 1)  so to foot in warm water twice a day for 15 minutes.  Begin Keflex two tabs b.i.d. x 10 days.  Return for toenail removal if the infection will not resolve Prescriptions: KEFLEX 500 MG CAPS (CEPHALEXIN) 2 by mouth  two times a day  #40 x 1   Entered and Authorized by:   Roderick Pee MD   Signed by:   Roderick Pee MD on 03/02/2009   Method used:   Electronically to        [facility] [address] #[id number]* (retail)       [address] [place], [place]  [address]       Ph: [phone]       Fax: [phone]   RxID:    [id number]

## 2010-03-02 NOTE — Progress Notes (Signed)
Summary: hydrocodone refill  Phone Note From Pharmacy   Summary of Call: patient is requesting a refill of hydrocodone 7.5/750 is this okay to fill? Initial call taken by: Kern Reap CMA Duncan Dull),  May 06, 2009 4:21 PM  Follow-up for Phone Call        Vicodin ES, dispense 30 tablets, directions one nightly p.r.n. for severe pain, refills x 3 Follow-up by: Roderick Pee MD,  May 06, 2009 4:39 PM  Additional Follow-up for Phone Call Additional follow up Details #1::        Pharmacist called Additional Follow-up by: Kern Reap CMA Duncan Dull),  May 06, 2009 5:12 PM

## 2010-03-02 NOTE — Progress Notes (Signed)
Summary: Pt req new written script for Hydrocodone. Old script expired  Phone Note Call from Patient Call back at [facility] Phone [phone]   Caller: Patient Summary of Call: Pt called and is req nurse to call re: Hydrocodone. Pt is needing a new script written because old script expired and he is going to take to different pharmacy.  Initial call taken by: Lucy Antigua,  November 22, 2009 11:35 AM  Follow-up for Phone Call        Fleet Contras please call to clarify the issue Follow-up by: Roderick Pee MD,  November 22, 2009 1:26 PM  Additional Follow-up for Phone Call Additional follow up Details #1::        left message on machine for patient to return our call Additional Follow-up by: Kern Reap CMA Duncan Dull),  November 23, 2009 9:57 AM    Additional Follow-up for Phone Call Additional follow up Details #2::    pt has found a cheaper pharmacy for refill and it is time for another refill and would like this printed if possible #100. Follow-up by: Kern Reap CMA Duncan Dull),  November 23, 2009 10:14 AM  Additional Follow-up for Phone Call Additional follow up Details #3:: Details for Additional Follow-up Action Taken: Vicodin ES dispense 100 tablets directions one half tablet 3 times a day as needed for severe pain, refills x 2 Additional Follow-up by: Roderick Pee MD,  November 23, 2009 11:46 AM  New/Updated Medications: HYDROCODONE-ACETAMINOPHEN 7.5-750 MG TABS (HYDROCODONE-ACETAMINOPHEN) take half tab by mouth three times a day as needed for severe pain Prescriptions: HYDROCODONE-ACETAMINOPHEN 7.5-750 MG TABS (HYDROCODONE-ACETAMINOPHEN) take half tab by mouth three times a day as needed for severe pain  #100 x 2   Entered by:   Kern Reap CMA (AAMA)   Authorized by:   Roderick Pee MD   Signed by:   Kern Reap CMA (AAMA) on 11/23/2009   Method used:   Print then Give to Patient   RxID:   [id number]

## 2010-03-02 NOTE — Progress Notes (Signed)
Summary: need results  Phone Note Call from Patient Call back at Home Phone [phone]   Caller: Patient---live call Reason for Call: Lab or Test Results Summary of Call: need scan results. Had it done today. Initial call taken by: Warnell Forester,  May 19, 2009 3:26 PM  Follow-up for Phone Call        please call scan shows acute thyroiditis, most likely medication induced.  Treatment is prednisone two tabs for 3 days, one for 3 days, after 3 days, then half a tablet Monday, Wednesday, Friday, for a 3-week taper.  Return in two weeks for follow-up.: 50 tablets, please Fleet Contras Follow-up by: Roderick Pee MD,  May 19, 2009 4:18 PM  Additional Follow-up for Phone Call Additional follow up Details #1::        Phone Call Completed, Rx Called In Additional Follow-up by: Kern Reap CMA Duncan Dull),  May 19, 2009 5:01 PM    New/Updated Medications: PREDNISONE 20 MG TABS (PREDNISONE) use as directed

## 2010-03-02 NOTE — Progress Notes (Signed)
Summary: REQUEST FOR RX  Phone Note Call from Patient   Caller: Patient  [phone] Summary of Call: Pt called to speak with Dr. Tawanna Cooler /  Fleet Contras, CMA..... Pt adv he was in for OV on Monday ref eval of leg pain.... Is still exp same pain --- would like to have Rx for Flexeril sent to  [facility] [address].... Pt can be reached at [phone] with any questions or concerns.  Initial call taken by: Debbra Riding,  January 04, 2010 10:04 AM  Follow-up for Phone Call        I want to wait until he sees the  orthopedist this week before adding any new medication Follow-up by: Roderick Pee MD,  January 04, 2010 1:47 PM  Additional Follow-up for Phone Call Additional follow up Details #1::        Phone Call Completed Additional Follow-up by: Kern Reap CMA Duncan Dull),  January 04, 2010 1:49 PM

## 2010-03-02 NOTE — Assessment & Plan Note (Signed)
Summary: FLU-LIKE SXS // RS   Vital Signs:  Patient profile:   70 year old male Weight:      206 pounds BMI:     28.04 Temp:     98.7 degrees F oral Pulse rate:   78 / minute Pulse rhythm:   irregular BP sitting:   164 / 80  (left arm) Cuff size:   regular  Vitals Entered By: Raechel Ache, RN (March 28, 2009 9:06 AM) CC: C/o runny nose, congestion, sore throat and achy x 4 days. Took some cold tablets and got shaky and heart skipping.   CC:  C/o runny nose, congestion, and sore throat and achy x 4 days. Took some cold tablets and got shaky and heart skipping.Marland Kitchen  History of Present Illness: [name] Woods is a 70 year old, married male, who comes in with a 5-day history of congestion, sore throat, nonproductive cough.  Over the weekend.  He took some over-the-counter medication.  Yesterday he noticed his heart was racing.  His pulse today is 120 to 130 and irregular, consistent with A.  fibrillation.  His cardiologist is Dr. Gery Pray  Allergies: No Known Drug Allergies  Past History:  Past medical, surgical, family and social histories (including risk factors) reviewed for relevance to current acute and chronic problems.  Past Medical History: Reviewed history from 04/25/2007 and no changes required. 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 Cholecystectomy   Family History: Reviewed history and no changes required.  Social History: Reviewed  history from 05/05/2008 and no changes required. Retired Married Alcohol use-no Drug use-no Regular exercise-no  Review of Systems      See HPI  Physical Exam  General:  Well-developed,well-nourished,in no acute distress; alert,appropriate and cooperative throughout examination Head:  Normocephalic and atraumatic without obvious abnormalities. No apparent alopecia or balding. Eyes:  No corneal or conjunctival inflammation noted. EOMI. Perrla. Funduscopic exam benign, without hemorrhages, exudates or papilledema. Vision grossly normal. Ears:  External ear exam shows no significant lesions or deformities.  Otoscopic examination reveals clear canals, tympanic membranes are intact bilaterally without bulging, retraction, inflammation or discharge. Hearing is grossly normal bilaterally. Nose:  External nasal examination shows no deformity or inflammation. Nasal mucosa are pink and moist without lesions or exudates. Mouth:  Oral mucosa and oropharynx without lesions or exudates.  Teeth in good repair. Neck:  No deformities, masses, or tenderness noted. Chest Wall:  No deformities, masses, tenderness or gynecomastia noted. Lungs:  Normal respiratory effort, chest expands symmetrically. Lungs are clear to auscultation, no crackles or wheezes. Heart:  tachycardia and irregular rhythm.     Problems:  Medical Problems Added: 1)  Dx of Viral Infection-unspec  (ICD-079.99)  Impression & Recommendations:  Problem # 1:  PAROXYSMAL ATRIAL FIBRILLATION (ICD-427.31) Assessment Deteriorated  His updated medication list for this problem includes:    Cardizem La 300 Mg Tb24 (Diltiazem hcl coated beads) .Marland Kitchen... 1qd    Amiodarone Hcl 200 Mg Tabs (Amiodarone hcl) .Marland Kitchen... 1/2 tab by mouth daily    Aspirin Ec 325 Mg Tbec (Aspirin) .Marland Kitchen... Take one tablet daily    Nadolol 20 Mg Tabs (Nadolol) ..... One by mouth daily  Problem # 2:  VIRAL INFECTION-UNSPEC (ICD-079.99) Assessment: New  His updated medication  list for this problem includes:    Aspirin Ec 325 Mg Tbec (Aspirin) .Marland Kitchen... Take one tablet daily    Hydromet 5-1.5 Mg/73ml Syrp (Hydrocodone-homatropine) .Marland Kitchen... 1 or 2 tsps three times a day as needed  Complete Medication List: 1)  Cyclobenzaprine Hcl 10 Mg Tabs (Cyclobenzaprine hcl) .Marland Kitchen.. 1 qd 2)  Hydrocodone-acetaminophen 7.5-750 Mg Tabs (Hydrocodone-acetaminophen) .... Take one tab three times a day 3)  Cardizem La 300 Mg Tb24 (Diltiazem hcl coated beads) .Marland Kitchen.. 1qd 4)  Amiodarone Hcl 200 Mg Tabs (Amiodarone hcl) .... 1/2 tab by mouth daily 5)  Zestril 20 Mg Tabs (Lisinopril) .Marland Kitchen.. 1qd 6)  Colchicine 0.6 Mg Tabs (Colchicine) .... Prn 7)  Cialis 20 Mg Tabs (Tadalafil) .Marland Kitchen.. 1 tab as needed 8)  Aspirin Ec 325 Mg Tbec (Aspirin) .... Take one tablet daily 9)  Ultram 50 Mg Tabs (Tramadol hcl) .Marland Kitchen.. 1-2 by mouth every 6 hours as needed pain 10)  Nadolol 20 Mg Tabs (Nadolol) .... One by mouth daily 11)  Celexa 20 Mg Tabs (Citalopram hydrobromide) .Marland Kitchen.. 1 tab @ bedtime 12)  Ativan 0.5 Mg Tabs (Lorazepam) .... Take 1 tablet by mouth two times a day 13)  Hydromet 5-1.5 Mg/82ml Syrp (Hydrocodone-homatropine) .Marland Kitchen.. 1 or 2 tsps three times a day as needed  Patient Instructions: 1)  drink 30 ounces of water daily, vaporizer in your bedroom at night, he may take one or 2 teaspoons of Hydromet, up to 3 times a day as needed for cough and cold.  Do  not take any over-the-counter medication. 2)  Call y  cardiologist immediately and asked to be seen today Prescriptions: HYDROMET 5-1.5 MG/5ML SYRP (HYDROCODONE-HOMATROPINE) 1 or 2 tsps three times a day as needed  #8oz x 1   Entered and Authorized by:   Roderick Pee MD   Signed by:   Roderick Pee MD on 03/28/2009   Method used:   Print then Give to Patient   RxID:   [id number]

## 2010-03-02 NOTE — Progress Notes (Signed)
Summary: [facility] called. Pt req early refill of Hydrocodone   Phone Note From Pharmacy Call back at [facility] [phone] Katie   Caller: Ninfa Linden   Summary of Call: Pt is req an early refill of Hydrocodone 7.5/750.  Last refill was on 12/20/09. Pls call.  Initial call taken by: Lucy Antigua,  January 16, 2010 10:46 AM  Follow-up for Phone Call        Fleet Contras ??????????? Follow-up by: Roderick Pee MD,  January 16, 2010 12:39 PM  Additional Follow-up for Phone Call Additional follow up Details #1::        patient did go to orthopedist.  he had "fluid on the knee and the dr drained some fluid". he is still having some pain and has an appointment on Friday to see the orthopedist. Additional Follow-up by: Kern Reap CMA Duncan Dull),  January 16, 2010 2:35 PM    Additional Follow-up for Phone Call Additional follow up Details #2::    dispense 15 tablets...Marland KitchenMarland KitchenMarland Kitchen no refills.......... the orthopedist should write any other pain medication.  He needs for his bone or joint problems in the future Follow-up by: Roderick Pee MD,  January 16, 2010 3:48 PM  Additional Follow-up for Phone Call Additional follow up Details #3:: Details for Additional Follow-up Action Taken: rx called in. patient is aware Additional Follow-up by: Kern Reap CMA Duncan Dull),  January 16, 2010 5:06 PM

## 2010-03-07 ENCOUNTER — Other Ambulatory Visit: Payer: Self-pay | Admitting: *Deleted

## 2010-03-07 DIAGNOSIS — M25562 Pain in left knee: Secondary | ICD-10-CM

## 2010-03-07 MED ORDER — HYDROCODONE-ACETAMINOPHEN 7.5-750 MG PO TABS: 1.0000 | ORAL_TABLET | Freq: Three times a day (TID) | ORAL | Status: DC

## 2010-03-07 MED ORDER — HYDROCODONE-ACETAMINOPHEN 7.5-750 MG PO TABS: 1.0000 | ORAL_TABLET | Freq: Three times a day (TID) | ORAL | Status: AC

## 2010-03-07 NOTE — Telephone Encounter (Signed)
Refill request for vicodin es okay per dr Tawanna Cooler

## 2010-04-23 LAB — COMPREHENSIVE METABOLIC PANEL WITH GFR
ALT: 34 U/L (ref 0–53)
Alkaline Phosphatase: 73 U/L (ref 39–117)
Creatinine, Ser: 0.93 mg/dL (ref 0.4–1.5)
GFR calc non Af Amer: >60 mL/min (ref >60)
Total Bilirubin: 1 mg/dL (ref 0.3–1.2)

## 2010-04-23 LAB — DIFFERENTIAL
Basophils Absolute: 0 10*3/uL (ref 0.0–0.1)
Basophils Relative: 0 % (ref 0–1)
Eosinophils Absolute: 0.2 10*3/uL (ref 0.0–0.7)
Eosinophils Relative: 3 % (ref 0–5)
Lymphocytes Relative: 28 % (ref 12–46)
Lymphs Abs: 1.7 10*3/uL (ref 0.7–4.0)
Monocytes Absolute: 0.8 K/uL (ref 0.1–1.0)
Monocytes Relative: 14 % — ABNORMAL HIGH (ref 3–12)
Neutro Abs: 3.3 K/uL (ref 1.7–7.7)
Neutrophils Relative %: 55 % (ref 43–77)

## 2010-04-23 LAB — COMPREHENSIVE METABOLIC PANEL
AST: 30 U/L (ref 0–37)
Albumin: 2.7 g/dL — ABNORMAL LOW (ref 3.5–5.2)
BUN: 16 mg/dL (ref 6–23)
CO2: 27 mEq/L (ref 19–32)
Calcium: 9.5 mg/dL (ref 8.4–10.5)
Chloride: 107 mEq/L (ref 96–112)
GFR calc Af Amer: >60 mL/min (ref >60)
Glucose, Bld: 108 mg/dL — ABNORMAL HIGH (ref 70–99)
Potassium: 3.7 mEq/L (ref 3.5–5.1)
Sodium: 141 mEq/L (ref 135–145)
Total Protein: 6 g/dL (ref 6.0–8.3)

## 2010-04-23 LAB — CBC
HCT: 41 % (ref 39.0–52.0)
Hemoglobin: 14 g/dL (ref 13.0–17.0)
MCHC: 34.3 g/dL (ref 30.0–36.0)
MCV: 84.5 fL (ref 78.0–100.0)
Platelets: 168 K/uL (ref 150–400)
RBC: 4.85 MIL/uL (ref 4.22–5.81)
RDW: 12.5 % (ref 11.5–15.5)
WBC: 5.9 10*3/uL (ref 4.0–10.5)

## 2010-04-23 LAB — URINALYSIS, ROUTINE W REFLEX MICROSCOPIC
Bilirubin Urine: NEGATIVE
Glucose, UA: NEGATIVE mg/dL
Ketones, ur: NEGATIVE mg/dL
Protein, ur: NEGATIVE mg/dL

## 2010-04-23 LAB — PROTIME-INR
INR: 2.74 — ABNORMAL HIGH (ref 0.00–1.49)
INR: 3.04 — ABNORMAL HIGH (ref 0.00–1.49)
INR: 3.19 — ABNORMAL HIGH (ref 0.00–1.49)
Prothrombin Time: 28.8 seconds — ABNORMAL HIGH (ref 11.6–15.2)
Prothrombin Time: 31.2 s — ABNORMAL HIGH (ref 11.6–15.2)
Prothrombin Time: 32.4 seconds — ABNORMAL HIGH (ref 11.6–15.2)

## 2010-04-23 LAB — T3, FREE: T3, Free: 17.2 pg/mL — ABNORMAL HIGH (ref 2.3–4.2)

## 2010-04-23 LAB — APTT: aPTT: 54 seconds — ABNORMAL HIGH (ref 24–37)

## 2010-04-23 LAB — URINE MICROSCOPIC-ADD ON

## 2010-04-24 ENCOUNTER — Encounter: Payer: Self-pay | Admitting: Family Medicine

## 2010-04-24 ENCOUNTER — Ambulatory Visit (INDEPENDENT_AMBULATORY_CARE_PROVIDER_SITE_OTHER): Payer: Medicare Other | Admitting: Family Medicine

## 2010-04-24 VITALS — BP 140/80 | Temp 98.0°F | Ht 74.0 in | Wt 205.0 lb

## 2010-04-24 DIAGNOSIS — M25569 Pain in unspecified knee: Secondary | ICD-10-CM

## 2010-04-24 MED ORDER — HYDROCODONE-ACETAMINOPHEN 7.5-750 MG PO TABS: 1.0000 | ORAL_TABLET | Freq: Three times a day (TID) | ORAL | Status: DC

## 2010-04-24 NOTE — Progress Notes (Signed)
  Subjective:    Patient ID: [name] Woods, male    DOB: [birth year], 70 y.o.   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  A central with his knees for many years, but now getting worse.  He states that Dr. Madelon Lips, draw fluid from his right knee in January.  He is requesting pain medication.  I explained I could write him a short small quantity of pain medication until he can see the orthopedist, who then take over prescribing medication for his knee pain.  However, if he requires medicine, then this would be a criteria to get total knee replacement done   Review of Systems    General and musculoskeletal review of systems otherwise negative Objective:   Physical Exam    Well-developed well-nourished man in no acute distress.  Both knees are swollen.  The right extra worse than left.  Ligaments are intact.    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 Madelon Lips, ASAP..... Vicodin, dispense 30 tabs directions one t.i.d. P.r.n. Severe pain one refill.  Further refills done by orthopedist

## 2010-04-24 NOTE — Patient Instructions (Signed)
Call Dr. Madelon Lips, today and make an appointment to see him ASAP.  I will write to 30 pain pills with one refill............ You can take one tablet or half a tablet 3 times a day as needed for severe pain if you don't need medication during the day. ............ take  a full tablet at bedtime.  I will give you one refill.  When u  see Dr. Madelon Lips, that he will write all your pain medicine for your knees

## 2010-05-09 LAB — COMPREHENSIVE METABOLIC PANEL
ALT: 73 U/L — ABNORMAL HIGH (ref 0–53)
AST: 100 U/L — ABNORMAL HIGH (ref 0–37)
AST: 43 U/L — ABNORMAL HIGH (ref 0–37)
Albumin: 3.1 g/dL — ABNORMAL LOW (ref 3.5–5.2)
CO2: 24 mEq/L (ref 19–32)
Calcium: 8.4 mg/dL (ref 8.4–10.5)
Chloride: 107 mEq/L (ref 96–112)
Creatinine, Ser: 1.35 mg/dL (ref 0.4–1.5)
GFR calc Af Amer: >60 mL/min (ref >60)
GFR calc Af Amer: >60 mL/min (ref >60)
GFR calc non Af Amer: 53 mL/min — ABNORMAL LOW (ref >60)
Sodium: 139 mEq/L (ref 135–145)
Total Bilirubin: 1.3 mg/dL — ABNORMAL HIGH (ref 0.3–1.2)
Total Protein: 5.9 g/dL — ABNORMAL LOW (ref 6.0–8.3)

## 2010-05-09 LAB — CBC
HCT: 42.3 % (ref 39.0–52.0)
MCHC: 34.3 g/dL (ref 30.0–36.0)
MCV: 86.3 fL (ref 78.0–100.0)
Platelets: 171 10*3/uL (ref 150–400)
RBC: 4.9 MIL/uL (ref 4.22–5.81)
RBC: 5.43 MIL/uL (ref 4.22–5.81)
RDW: 13.6 % (ref 11.5–15.5)
WBC: 6.4 10*3/uL (ref 4.0–10.5)

## 2010-05-09 LAB — URINE CULTURE: Colony Count: NO GROWTH

## 2010-05-09 LAB — URINE MICROSCOPIC-ADD ON

## 2010-05-09 LAB — URINALYSIS, ROUTINE W REFLEX MICROSCOPIC
Glucose, UA: NEGATIVE mg/dL
Ketones, ur: NEGATIVE mg/dL
Leukocytes, UA: NEGATIVE
Specific Gravity, Urine: 1.025 (ref 1.005–1.030)
pH: 5.5 (ref 5.0–8.0)

## 2010-05-09 LAB — AMYLASE: Amylase: 334 U/L — ABNORMAL HIGH (ref 27–131)

## 2010-05-09 LAB — GLUCOSE, CAPILLARY
Glucose-Capillary: 118 mg/dL — ABNORMAL HIGH (ref 70–99)
Glucose-Capillary: 83 mg/dL (ref 70–99)
Glucose-Capillary: 93 mg/dL (ref 70–99)

## 2010-05-09 LAB — CULTURE, BLOOD (ROUTINE X 2)
Culture: NO GROWTH
Culture: NO GROWTH

## 2010-05-09 LAB — DIFFERENTIAL
Eosinophils Absolute: 0 10*3/uL (ref 0.0–0.7)
Eosinophils Relative: 0 % (ref 0–5)
Lymphocytes Relative: 4 % — ABNORMAL LOW (ref 12–46)
Lymphs Abs: 0.4 10*3/uL — ABNORMAL LOW (ref 0.7–4.0)
Monocytes Relative: 5 % (ref 3–12)

## 2010-05-09 LAB — AMIODARONE LEVEL: N-Desethyl-Amiodarone: 0.24 ug/mL — ABNORMAL LOW (ref 0.25–3.50)

## 2010-05-09 LAB — TRIGLYCERIDES: Triglycerides: 65 mg/dL (ref <150)

## 2010-05-09 LAB — HEMOCCULT GUIAC POC 1CARD (OFFICE): Fecal Occult Bld: NEGATIVE

## 2010-05-09 LAB — LIPASE, BLOOD: Lipase: 34 U/L (ref 11–59)

## 2010-05-22 ENCOUNTER — Telehealth: Payer: Self-pay | Admitting: *Deleted

## 2010-05-22 NOTE — Telephone Encounter (Signed)
[facility] Triage Call Report Triage Record Num: [id number] Operator: Lesli Albee Patient Name: Waylan Busta Call Date & Time: 05/20/2010 4:59:24PM Patient Phone: [phone] PCP: Eugenio Hoes. Todd Patient Gender: Male PCP Fax : [phone] Patient DOB: 10-Jun-1940 [facility] Name[facility] Reason for Call: Pt is calling for abx for an abcessed tooth. Pt had lost a filling on 05/18/10. Pt states he has a dentist appt on next Friday but that is as soon as they could fit him in. Pt is afebrile. Pt adamant that an abx be called in . RN advised UC/pt refused. RN called Dr. Dallas Schimke he reiterated - pt needs to be seen at [facility]. Protocol(s) Used: Mouth Injury Recommended Outcome per Protocol: Provide Home/Self Care Reason for Outcome: All other situations Care Advice: Apply small sterile dressing or clean cloth to injured area; apply gentle pressure, if possible, to control bleeding. Do not pack dressings in mouth. ~ Apply cloth-covered ice pack or a cool compress to the area for no more than 20 minutes 4-8 times a day while awake to reduce pain and swelling. ~ 05/21/2010 5:09:40AM Page 1 of 1 [email or web address]

## 2010-06-12 ENCOUNTER — Ambulatory Visit (INDEPENDENT_AMBULATORY_CARE_PROVIDER_SITE_OTHER): Payer: Medicare Other | Admitting: Family Medicine

## 2010-06-12 ENCOUNTER — Encounter: Payer: Self-pay | Admitting: Family Medicine

## 2010-06-12 DIAGNOSIS — M7022 Olecranon bursitis, left elbow: Secondary | ICD-10-CM | POA: Insufficient documentation

## 2010-06-12 DIAGNOSIS — M7020 Olecranon bursitis, unspecified elbow: Secondary | ICD-10-CM

## 2010-06-12 DIAGNOSIS — M25569 Pain in unspecified knee: Secondary | ICD-10-CM

## 2010-06-12 MED ORDER — PREDNISONE 20 MG PO TABS: ORAL_TABLET | ORAL | Status: DC

## 2010-06-12 MED ORDER — HYDROCODONE-ACETAMINOPHEN 7.5-750 MG PO TABS: 1.0000 | ORAL_TABLET | Freq: Three times a day (TID) | ORAL | Status: DC

## 2010-06-12 NOTE — Progress Notes (Signed)
  Subjective:    Patient ID: [name] Woods, male    DOB: 1940/11/24, 70 y.o.   MRN: [id number]  [name] Woods is a 70 year old, married male, nonsmoker, who comes in today for evaluation of two problems.  Last Thursday he noticed some swelling of his left elbow.  No history of trauma.  He had surgery on that elbow many years ago from a traumatic football injury.  We sent him to see the orthopedist, Dr. Madelon Lips.  They recommended bilateral total knee replacements.  However, this is his busy work season, and he doesn't want to take off time from work now.  He anticipates getting the surgery done in the winter.  Dr. Madelon Lips will not write his pain medicine????????????    Review of Systems General an orthopedic review of systems otherwise negative    Objective:   Physical Exam    Well developed, well nourished, male in no acute distress.  Examination of the left elbow shows the elbow is swollen and red.  This is consistent with an acute olecranon bursitis    Assessment & Plan:  Olecranon bursitis,,,,,,,,,,, prednisone burst and taper,,,,,,,,,, if no improvement in 48 to 72 hours see orthopedist stat.  Chronic knee pain,,,,,,,,,,,, Vicodin 3 times a day p.r.n.

## 2010-06-12 NOTE — Patient Instructions (Signed)
Prednisone as directed.   100 tablets of the Vicodin with 5 refills, which will last use 6 months.  If you don't see any improvement in her elbow with in the next 48 to 72 hours see the orthopedist on Thursday

## 2010-06-13 NOTE — Discharge Summary (Signed)
NAME:  [name] Woods, [name] Woods NO.:  [id number]   MEDICAL RECORD NO.:  [id number]          PATIENT TYPE:  INP   LOCATION:  4712                         FACILITY:  [facility]   PHYSICIAN:  Stacie Glaze, MD    DATE OF BIRTH:  12/11/1940   DATE OF ADMISSION:  06/21/2008  DATE OF DISCHARGE:  06/23/2008                               DISCHARGE SUMMARY   ADMITTING DIAGNOSES:  1. Weakness.  2. Diarrhea.  3. Abdominal pain.   DISCHARGE DIAGNOSES:  1. Common duct stone.  2. Acute pancreatitis, resolved.  3. Abdominal pain, resolved.  4. History of ankylosing spondylitis.  5. History of hypertension.  6. History of paroxysmal atrial fibrillation, on amiodarone.   HOSPITAL COURSE:  The patient is a pleasant 70 year old white male  admitted to the emergency room for abdominal pain with an elevated  amylase and lipase in the setting of acute diarrheal syndrome.   He was evaluated in the emergency room and admitted for further  evaluation after abdominal CT did not reveal any stone or obstruction or  mass in the liver or pancreas.  His amylase and lipase quickly  corrected.  His nausea and abdominal pain resolved.  GI was consulted  which ordered an MR ECP which revealed no ductal abnormalities, no  masses, and no evidence for pancreatic cancer.  The presumed etiology  was a stone in the ductal system probably obstructing the common duct  temporarily that was passed since his symptoms resolved so quickly.  However, other etiologies were considered including medication side  effects and amiodarone was a suspect.   Due to this, we altered his medicine regimen slightly.  We changed his  Troprol from 25 XL p.o. b.i.d. to nadolol 20 mg daily and would titrate  that to 40 if his rate increases.  We changed his amiodarone from 200  p.o. daily to 100 p.o. daily.  Otherwise, all of his medications  remained the same.  He is discharged in stable condition.  He was given  two doses of  antibiotics during the workup but in consultation with GI,  it was not considered to be appropriate for him to continue antibiotic  therapy after all of his symptoms resolved and no infectious etiology  was determined.   He was discharged to home in stable and improved condition.  He is to  follow up with his primary physician, Dr. Kelle Darting in 1-2 weeks and  Dr. Claudette Head within 2 months.      Stacie Glaze, MD  Electronically Signed    JEJ/MEDQ  D:  06/23/2008  T:  06/24/2008  Job:  [id number]

## 2010-06-13 NOTE — Consult Note (Signed)
NAME:  [name] Woods, [name] Woods NO.:  [id number]   MEDICAL RECORD NO.:  [id number]          PATIENT TYPE:  INP   LOCATION:  4712                         FACILITY:  [facility]   PHYSICIAN:  Iva Boop, MD,FACGDATE OF BIRTH:  [birth year]   DATE OF CONSULTATION:  06/21/2008  DATE OF DISCHARGE:                                 CONSULTATION   CHIEF COMPLAINT/REASON FOR CONSULTATION:  Acute pancreatitis.   REQUESTING Zaira Iacovelli:  [facility] covering for Dr. Tawanna Cooler who is his  primary care physician.   HOSPITALIST ADMITTING:  Raenette Rover. Felicity Coyer, MD   ASSESSMENT AND RECOMMENDATIONS:  Acute pancreatitis, cause not entirely  clear.  CT of the abdomen and pelvis, which I have viewed shows focal  dilation in the distal common bile duct in the region of the pancreatic  head, unchanged from 2008.  This is suspicious for choledochal cyst.  These films have been self viewed.  They report the pancreas looks  normal, but I think that tail looks mildly inflamed.  Note that his  intraoperative cholangiogram in 2005, did not show anything like a  choledochal cyst.  It did not drain and a question of a retained stone  was raised at that time.  Initial operative report indicates that though  it did not drain, I did not suspect a stone.   Cause of the pancreatitis is not clear at this time. A choledochal cyst  certainly could do that.  A retained stone might be part of the problem.  He is not that tender after medications at this time.  His lipase and  amylase are elevated consistent with that.  He has been taking Zestril,  which is known to cause pancreatitis and that is possible as it cause  hypertriglyceridemia would be possible though that is not known to be a  problem.  He has ankylosing spondylitis.  I do not think that is  related.   RECOMMENDATIONS AND PLAN:  1. Bowel rest.  2. I think at this poin, an MRCP is going to me most likely.  3. He has had Zosyn started, I do not see an  obvious need for IV      antibiotics at this point, looks like he is getting those because      of the right ureteral stones and concern about possible infection      there, so it is prophylaxis for that.  It is interesting to note      that he has renal calculi, ureteral calculi, and pancreatitis and      so hyperparathyroidism comes to mind with that.  However, he does      not have hypercalcemia at this time, so that goes against that.   HISTORY:  This is a 70 year old white man who had the problems as  outlined above.  For several months, he had been drinking milk due to an  upset stomach.  At times, he feels hungry and has a hunger annoying like  pains and the milk seems to help it.  He is admitted today.  After about  24 hours ago, he had increasing left  flank pain, nausea, vomiting and  diarrhea, started early in the morning, I guess he felt rigorous at  times.  He had been tender to touch in the left side, was feeling a  little bit worse.  A week or so ago he stopped Celexa that was started  because he felt hungry over all the time.  He was profoundly weak with  these episodes yesterday.  He presented to the emergency department, and  he was evaluated and he was given IV analgesics and antiemetics and  feels much better at this time.  He does not really have any chronic  indigestion or heartburn other than those things mentioned above.  He  denies dysphagia.  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   PAST MEDICAL HISTORY:  1. Ankylosing spondylitis.  2. Adenomatous colon polyp, 2008.  3. Left ventricular hypertrophy.  4. Prior cholecystectomy and umbilical hernia repair in 2005, as noted      above.  I have reviewed the cholangiogram as well.  5. Gout.  6. Mood disorder.  7. Paroxysmal atrial fibrillation.  8. Obstructive sleep apnea.  9. Baker cyst resection.   MEDICATIONS PRIOR TO ADMISSION:  Imodium p.r.n., Cardizem,  amiodarone,  Zestril, aspirin, metoprolol, Ativan.  Here at the hospital; amiodarone,  aspirin, Tiazac Lovenox, Prinivil, metoprolol, Zosyn.   DRUG ALLERGIES:  None known.   REVIEW OF SYSTEMS:  He has lost 13 pounds over the past 3 months.  He  has had problems with depressive symptomatology.  He has a lot of joint  pain, low back pain, intermittent headaches, some hyperglycemia,  nocturia all other systems are negative at this time.   FAMILY HISTORY:  Diabetes in siblings.  Father and 2 brothers with  myocardial infarction and dyslipidemia.   SOCIAL HISTORY:  He is a Char[employer] fundraiser, lives in  [place].  He is married, here with his wife today.   PHYSICAL EXAMINATION:  GENERAL:  Pleasant, well-developed, well-  nourished elderly white man.  VITAL SIGNS:  Temperature 99.3, pulse 59, respiration is 20, blood  pressure is 141/71.  Eyes are anicteric.  Extraocular movements are  intact.  Mouth shows teeth in fair shape.  There are no oral lesions.  NECK:  Supple.  No masses.  CHEST:  Clear.  HEART:  S1 and S2.  No murmurs or gallops.  ABDOMEN:  Soft, mildly tender to deep palpation in the left upper  quadrant and mid quadrant area and out towards the flank.  EXTREMITIES:  Without cyanosis, clubbing, or edema.  Pedal pulses intact.  He is alert  and oriented x3.  No tremors.  SKIN:  Warm and dry without acute rash.   LABORATORY DATA:  Amylase 334, lipase 675, bilirubin 1.4, alk phos 92,  AST 100, ALT 73.  His BMET is normal except for a glucose of 166.  Hemoccult negative.  Urinalysis negative.  Hemoglobin 15.9, hematocrit  46, platelets 171, MCV 85, white blood cell count 10,000.  Noted on the  CT, he also had stable liver and renal cysts.   The dilation in the distal common bile duct was unchanged from September 02, 2006.   I appreciate the opportunity to care for this patient.  We will also  consider an upper GI endoscopy depending upon the results and check   triglycerides.   We would hold the Zestril.      Iva Boop, MD,FACG  Electronically Signed  CEG/MEDQ  D:  06/21/2008  T:  06/22/2008  Job:  [id number]   cc:   Tinnie Gens A. Tawanna Cooler, MD

## 2010-06-13 NOTE — Consult Note (Signed)
NAME:  [name] Woods, [name] Woods NO.:  [id number]   MEDICAL RECORD NO.:  [id number]          PATIENT TYPE:  INP   LOCATION:  4712                         FACILITY:  [facility]   PHYSICIAN:  Iva Boop, MD,FACGDATE OF BIRTH:  January 20, 1941   DATE OF CONSULTATION:  DATE OF DISCHARGE:                                 CONSULTATION   ADDENDUM  He does not smoke or use alcohol.      Iva Boop, MD,FACG  Electronically Signed     CEG/MEDQ  D:  06/21/2008  T:  06/22/2008  Job:  [id number]

## 2010-06-13 NOTE — Assessment & Plan Note (Signed)
[facility]                         GASTROENTEROLOGY OFFICE NOTE   WILSON, DUSENBERY                   MRN:          [id number]  DATE:10/07/2006                            DOB:          11-Apr-1940    REFERRING PHYSICIAN:  Tinnie Gens A. Tawanna Cooler, MD   REASON FOR CONSULTATION:  Left flank pain and a personal history of  colon polyps.   HISTORY OF PRESENT ILLNESS:  This is a 70 year old white male who was  previously evaluated by Dr. Yancey Flemings.  He underwent a colonoscopy in  February 1995 for colon polyps seen on a screening sigmoidoscopy.  A 6  mm rectosigmoid polyp was removed, which was unfortunately complicated  by post polypectomy bleed.  The patient requested to switch  gastroenterologists.  He has had problems with left flank, left hip, and  left back pain that has been a constant problem for the past several  months.  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  He  states that he has had mild diarrhea since his cholecystectomy in 2005  and his diarrhea is generally brought on by meals.  He generally has 3  to 4 loose, non-bloody bowel movements a day and uses Imodium frequently  with fairly good control of his symptoms.  His flank pain and back pain  do not appear to change with any digestive function.  He notes a weight  gain over the past 6 months.  There is no family history of colon  cancer, colon polyps, or inflammatory bowel disease.   PAST MEDICAL HISTORY:  Hypertension.  Arthritis.  Kidney stones.  Status  post cholecystectomy.  Colon polyps.  History of paroxysmal atrial  fibrillation.  History of an upper endoscopy with empiric dilation for  dysphagia by Dr. Marina Goodell in November 1994.   CURRENT MEDICATIONS:  Listed on the chart, updated, and reviewed.   MEDICATION ALLERGIES:  Listed on the chart-reviewed.   SOCIAL HISTORY AND REVIEW OF SYSTEMS:  Per the handwritten form.   PHYSICAL EXAM:  Well-developed, over weight white male in no acute  distress.  Height 6 feet, weight 226.4 pounds, blood pressure 142/70, pulse 54 and  regular.  HEENT:  Anicteric sclerae, oropharynx clear.  CHEST:  Clear to auscultation bilaterally with tenderness over the left  lateral lower ribs and left costal margin along the flank.  CARDIAC:  Regular rate and rhythm without murmurs.  ABDOMEN:  Soft, nontender, nondistended.  Normoactive bowel sounds.  No  palpable organomegaly, masses, or hernias.  RECTAL:  Deferred to time of colonoscopy.  EXTREMITIES:  Without cyanosis, clubbing, or edema.  NEUROLOGIC:  Alert and oriented x3.  Grossly nonfocal.   ASSESSMENT AND PLAN:  1. Left flank, left back, and left chest wall pain.  These symptoms      appear to be musculoskeletal in etiology.  Some of his symptoms may      be related to  his sacroiliitis and lumbar spine disease.  Further      followup with Dr. Tawanna Cooler.  2. Personal history of colon polyps and frequent diarrhea with CT      findings suggestive of ankylosing spondylitis, rule out underlying      inflammatory bowel disease and recurrent colorectal neoplasms.      Risks, benefits, and alternatives to colonoscopy with possible      biopsy and possible polypectomy discussed with the patient, and he      consents to proceed.  This will be scheduled electively.     Venita Lick. Russella Dar, MD, [facility]  Electronically Signed    MTS/MedQ  DD: 10/07/2006  DT: 10/08/2006  Job #: [id number]   cc:   Tinnie Gens A. Tawanna Cooler, MD

## 2010-06-13 NOTE — Consult Note (Signed)
NAME:  ORPHEUS, HAYHURST NO.:  [id number]   MEDICAL RECORD NO.:  [id number]          PATIENT TYPE:  INP   LOCATION:  4712                         FACILITY:  [facility]   PHYSICIAN:  Iva Boop, MD,FACGDATE OF BIRTH:  1940-11-23   DATE OF CONSULTATION:  06/22/2008  DATE OF DISCHARGE:                                 CONSULTATION   Mr. Fleener had an MR/MRCP that showed a periampullary diverticulum  and not a choledochal cyst.  No stones in the bile duct.  No pancreatic  cancer, mass, or ductal abnormalities.  At this point, he is much  better.  His amylase and lipase have normalized.   The etiology of his pancreatitis is unclear.  His triglycerides returned  normal.  It could have been Zestril or amiodarone, it could have been a  passed common bile duct stone in light of his intraoperative  cholangiogram showing no passage of contrast a few years ago.   RECOMMENDATIONS AND PLAN:  He needs to Dr. Russella Dar in approximately a  month just for completeness and for followup.  I will defer to primary  care regarding holding the Zestril or amiodarone, I do not feel strongly  about that at this time.  Further plans pending clinical course.  Further imaging is unlikely to be necessary depending upon how he does I  would say.      Iva Boop, MD,FACG  Electronically Signed     CEG/MEDQ  D:  06/22/2008  T:  06/23/2008  Job:  [id number]   cc:   Venita Lick. Russella Dar, MD, [facility] Graham

## 2010-06-16 NOTE — Discharge Summary (Signed)
NAME:  [name] Woods, [name] Woods NO.:  [id number]   MEDICAL RECORD NO.:  [id number]          PATIENT TYPE:  INP   LOCATION:  3730                         FACILITY:  [facility]   PHYSICIAN:  Madaline Savage, M.D.DATE OF BIRTH:  October 06, 1940   DATE OF ADMISSION:  04/12/2005  DATE OF DISCHARGE:  04/15/2005                                 DISCHARGE SUMMARY   DISCHARGE DIAGNOSIS:  1.  Atrial fibrillation, rate controlled at discharge.  2.  Treated hypertension.  3.  Gastroesophageal reflux.  4.  Negative Cardiolite study.   HOSPITAL COURSE:  Patient is a 70 year old male followed by Dr. Elsie Lincoln and  Dr. Tawanna Cooler who presented to the emergency room with palpitations, shortness of  breath, and weakness.  Patient has a history of normal coronaries in 2003.  He has had a history of PAF since the 1990s.  He was admitted to telemetry,  started on IV heparin.  Enzymes were negative.  Cardiolite study showed no  significant ischemia.  Plan is for Coumadin therapy with Lovenox crossover.  Patient will be cardioverted in a few weeks as an outpatient.   DISCHARGE MEDICATIONS:  1.  Coumadin 7.5 mg Sunday, Tuesday and Thursday; 5 mg other days.  2.  HCTZ 25 mg a day.  3.  Toprol XL 75 mg a day.  4.  Cardizem CD 240 mg a day.  5.  Lisinopril 20 mg a day.  6.  Aspirin 81 mg a day.  7.  Lovenox 80 mg twice a day for six doses.   LABORATORIES:  White count 10.8, hemoglobin 15.1, hematocrit 43.5, platelets  251.  INR 1.  Sodium 140, potassium 3.7, BUN 18, creatinine 1.4.  CK-MB and  troponins were negative x2.  Cholesterol is 235, HDL 26, LDL 92.  Cardiolite  study showed no evidence of ischemia or scar.  EKG shows atrial  fibrillation, controlled ventricular response.   DISPOSITION:  Patient is discharged in stable condition and will follow up  in our office with Dr. Jenne Campus at the patient's request.      Abelino Derrick, P.A.    ______________________________  Madaline Savage, M.D.    LKK/MEDQ  D:  05/22/2005  T:  05/23/2005  Job:  [id number]   cc:   Tinnie Gens A. Tawanna Cooler, M.D. [facility]  [address] [place]  [place] [address]

## 2010-06-16 NOTE — Cardiovascular Report (Signed)
. [facility]  Patient:    [name] Woods, [name] Woods Visit Number: [id number] MRN: [id number]          Service Type: MED Location: [phone] Attending Physician:  Ophelia Shoulder Dictated by:   Madaline Savage, M.D. Proc. Date: 03/18/01 Admit Date:  03/18/2001   CC:         Lennette Bihari, M.D.  Cardiac Catheterization Lab   Cardiac Catheterization  PROCEDURES PERFORMED: 1. Selective coronary angiography by Judkins technique. 2. Retrograde left heart catheterization. 3. Left ventricular angiography.  COMPLICATIONS:  None.  ENTRY SITE:  Right femoral.  DYE USED:  Omnipaque.  PATIENT PROFILE:  The patient is a 70 year old gentleman who has a strong family history of coronary artery disease and premature myocardial infarction and death who had an episode of subxiphoid discomfort last evening that worsened and caused him to present to the emergency room.  He had a normal cardiac catheterization in the 1990s and had two EKGs, one of which showed some mild ST segment depression in the inferior leads.  The patients cardiac enzymes have been negative.  He is brought to the catheterization lab today electively to further investigate his symptoms.  RESULTS:  PRESSURES: 1. The left ventricular pressure was 136/2.  End diastolic pressure 10. 2. Central aortic pressure 136/72, mean of 98. 3. No aortic valve gradient by pullback technique.  ANGIOGRAPHIC RESULTS: 1. The left main coronary artery was very short and very large but no lesions    noted.  2. The LAD coursed to the cardiac apex and gave rise to a large septal    perforator branch.  The first diagonal branch was small.  The second    diagonal branch arising from the LAD was fairly large and showed no    lesions.  The LAD itself in the mid portion of the vessel contained a 30%    lesion just after the second diagonal branch.  3. The circumflex was a fairly large vessel  giving rise to a large bifurcating    first obtuse marginal branch.  No lesions were seen in the OM and its    branches or in the circumflex itself.  4. The right coronary artery was a medium sized vessel giving rise to a small    posterior descending branch and a posterolateral branch and a large acute    marginal branch.  No lesions were seen.  LEFT VENTRICULOGRAPHY:  The left ventricle showed symmetrical wall motion with an ejection fraction estimated at 65%.  No mitral regurgitation or LV thrombus noted.  FINAL DIAGNOSES: 1. Angiographically patent coronary arteries (30% insignificant lesion in the    mid left anterior descending artery near the diagonal). 2. Normal left ventricular systolic function.  RECOMMENDATIONS:  The patient is reassured with respect to his heart and will be treated with proton pump inhibitors for suspected gastrointestinal disorder. Dictated by:   Madaline Savage, M.D. Attending Physician:  Ophelia Shoulder DD:  03/18/01 TD:  03/18/01 Job: [id number]

## 2010-06-16 NOTE — Discharge Summary (Signed)
[place]  Patient:    [name] Woods, [name] Woods Visit Number: [id number] MRN: [id number]          Service Type: MED Location: [phone] Attending Physician:  Ophelia Shoulder Dictated by:   Raymon Mutton, P.A. Admit Date:  03/18/2001 Discharge Date: 03/18/2001   CC:         Lennette Bihari, M.D.   Discharge Summary  DATE OF BIRTH:  14-Mar-2040  DISCHARGE DIAGNOSES: 1. Atypical chest pain.  Ruled out ischemia.  Status post cardiac    catheterization with normal coronaries. 2. Hypertension. 3. History of paroxysmal atrial fibrillation. 4. Gastroesophageal reflux disease.  HISTORY OF PRESENT ILLNESS:  Mr. Mcneel is a 70 year old Caucasian gentleman, patient of Dr. Tresa Endo, who was seen remotely in our office before ______ separate from [facility] group.  Presented to the emergency room with chest discomfort.  In the past, he was seen by Dr. Tresa Endo for atrial fibrillation, but since then was in sinus, and was on Coumadin for a short time.  When he stopped his return visits.  He also was hospitalized with the same diagnosis of atrial fibrillation.  Was cardioverted successfully, and has not been on antiarrhythmic or any anticoagulation therapy per the patients history.  Records are not available.  He also has a history of poorly controlled hypertension, and a significant family history of coronary artery disease.  He was admitted to the telemetry unit.  Was started on IV nitroglycerin and heparin, and underwent catheterization the same day as a diagnostic procedure.  HOSPITAL PROCEDURES:  Cardiac catheterization done by Dr. Elsie Lincoln on March 18, 2001, revealed angiographically patent coronary arteries with 70% insignificant lesion in the mid left anterior descending artery near the diagonal branch, and normal left ventricular systolic function.  Ejection fraction 65%.  No mitral regurgitation or LV thrombus noted.  The  patient tolerated the procedure well.  It was performed with no complications.  CONDITION ON DISCHARGE:  The patient was discharged home in stable condition the next day.  His groin site did not reveal any ecchymoses, bleeding, or oozing.  He was given reassurance with respect to his heart, and the proton pump inhibitor therapy for a suspected gastroesophageal reflux disorder was started.  DISCHARGE MEDICATIONS: 1. Toprol XL 25 mg one pill q.d. 2. Hydrochlorothiazide 25 mg one pill q.a.m. q.d. 3. Tiazac 240 mg one pill q.d. 4. Aspirin 325 mg one pill q.d. 5. Nexium 40 mg one pill q.d.  LABORATORY DATA:  EKG revealed sinus rhythm with mild inversion of the Q-wave in lead V1.  We do not have a previous EKG on the file for comparison. Otherwise, normal EKG.  Chest x-ray showed chronic obstructive pulmonary disease and no acute disease.  His hemoglobin was 17.6, hematocrit 49.9, platelets 255, white blood cell count 9.3.  Sodium 137, potassium 3.1 initially which was increased after potassium supplements to 3.7, glucose 104.  BUN 11, creatinine 1.0.  The liver function tests, amylase, and lipase were within normal limits.  CK-MB and troponin-I x2 were within normal limits also.  Lipid profile showed cholesterol 161, triglycerides 130, HDL 37, and LDL 98.  ACTIVITY:  No driving, no heavy lifting greater than 5 pounds, no strenuous physical activity for 72 hours after discharge.  WOUND CARE:  The patient was instructed to report any oozing, bleeding, swelling, or increased pain of the groin puncture site.  Allowed to take a shower.  Advised to wash the area gently, pat dry, and  avoid any rubbing.  FOLLOWUP:  Appointment with Dr. Tresa Endo was scheduled on April 10, 2001, at 10 a.m. Dictated by:   Raymon Mutton, P.A. Attending Physician:  Ophelia Shoulder DD:  03/28/01 TD:  03/28/01 Job: [id number]

## 2010-06-16 NOTE — Op Note (Signed)
NAME:  [name] Woods, [name] Woods                      ACCOUNT NO.:  [id number]   MEDICAL RECORD NO.:  [id number]                   PATIENT TYPE:  AMB   LOCATION:  DAY                                  [facility]ACILITY[facility]  [facility]   PHYSICIAN:  Currie Paris, M.D.           DATE OF BIRTH:  [birth year]   DATE OF PROCEDURE:  04/22/2003  DATE OF DISCHARGE:                                 OPERATIVE REPORT   Office medical record [id number].   PREOPERATIVE DIAGNOSES:  1. Chronic calculous cholecystitis.  2. Umbilical hernia.   POSTOPERATIVE DIAGNOSES:  1. Chronic calculous cholecystitis.  2. Umbilical hernia.   OPERATION/PROCEDURE:  1. Laparoscopic cholecystectomy with operative cholangiogram.  2. Repair of umbilical hernia.   SURGEON:  Currie Paris, M.D.   ASSISTANT:  Anselm Pancoast. Zachery Dakins, M.D.   ANESTHESIA:  General endotracheal anesthesia.   CLINICAL HISTORY:  This patient is a 70 year old man who has had some  abdominal discomfort and was found to have sludge and __________ calculi on  ultrasound of the his gallbladder.  He also had a small umbilical hernia as  well as a diastasis recti.   DESCRIPTION OF PROCEDURE:  The patient was seen in the holding and had no  further questions.  He was taken to the operating room and after  satisfactory general endotracheal anesthesia had been obtained, the abdomen  was prepped and draped and 0.25% plain Marcaine was used for each incision.  The umbilical incision was made first, and a transverse incision was made.  The skin at the superior flap was raised so I could identify the defect and  free up the fascia above the incision.  Once this was done, a pursestring of  0 Vicryl was placed and the Hasson introduced.  The abdomen was insufflated  to 15 and the patient was tilted to the left and reverse Trendelenburg.  Additional trocars were placed in the usual positions.   The gallbladder was grasped and retracted over the liver and the area of  the  triangle of Calot dissected.  There was a fair amount of fatty tissue.  This  was gently dissected off of the cystic duct and once I had got the cystic  duct dissected, I could identify a fairly good length of that and see what  appeared to be a branch of the cystic artery posteriorly.  The cystic duct  was clipped once at the junction with the gallbladder and one clip placed on  the cystic artery.  The duct was opened and a Cook catheter placed  percutaneously and put in the duct and operative cholangiography done.  We  did not get really good filling in the duodenum but no filling defects and  the anatomy looked okay with filling of both right and left hepatic ducts.  The films were reviewed by Dr. Eppie Gibson who agreed.   The catheter was removed and three clips placed on the __________ side of  the  cystic duct and it was divided.  Additional clips were placed on what  appeared to be a small branch of the artery.  That was divided and then the  main artery dissected out a little bit more and clipped and divided.  The  gallbladder was then removed from below to above.  It was somewhat  intrahepatic and there was a bit of a raw surface.  Once we had  disconnected, we irrigated and cauterized the bed to make sure everything  was dry.  The gallbladder was placed in the bag and brought out the  umbilical port.  I reinsufflated and checked for hemostasis and noticed a  little bleeding from the liver edge which was cauterized and then a small  piece of Surgicel placed on this.  The bed of the gallbladder itself  appeared okay.  Once this was done, I removed the lateral ports.  The  epigastric port was left in temporarily and the umbilical port removed.   The hernia defect had been identified earlier and the fascia here was closed  with three sutures of 0 Prolene.  This closed easily and no tension.  The  abdomen was reinsufflated.  Check was made with the camera in the epigastric  port to  make sure we had not trapped any bowel in this closure, and  everything again appeared okay.  No blood had accumulated in the right upper  quadrant in the interim.  The abdomen was deflated through the epigastric  port.  Skin was closed with 4-0 Monocryl subcuticular Dermabond.  The  patient tolerated the procedure well.  No operative complications and all  counts were correct.                                               Currie Paris, M.D.    CJS/MEDQ  D:  04/22/2003  T:  04/22/2003  Job:  [id number]   cc:   Tinnie Gens A. Tawanna Cooler, M.D. [facility]

## 2010-06-28 ENCOUNTER — Other Ambulatory Visit: Payer: Self-pay | Admitting: Family Medicine

## 2010-06-28 MED ORDER — DILTIAZEM HCL ER COATED BEADS 300 MG PO TB24: 300.0000 mg | ORAL_TABLET | Freq: Every day | ORAL | Status: DC

## 2010-06-28 NOTE — Telephone Encounter (Signed)
Pt req refill of diltiazem (CARDIZEM LA) 300 MG to be sent in to [facility]. Pt has changed pharmacys.

## 2010-09-21 ENCOUNTER — Encounter: Payer: Self-pay | Admitting: Family Medicine

## 2010-09-21 ENCOUNTER — Ambulatory Visit (INDEPENDENT_AMBULATORY_CARE_PROVIDER_SITE_OTHER): Payer: Medicare Other | Admitting: Family Medicine

## 2010-09-21 VITALS — BP 150/70 | Temp 98.1°F | Wt 213.0 lb

## 2010-09-21 DIAGNOSIS — IMO0001 Reserved for inherently not codable concepts without codable children: Secondary | ICD-10-CM

## 2010-09-21 DIAGNOSIS — J45901 Unspecified asthma with (acute) exacerbation: Secondary | ICD-10-CM

## 2010-09-21 MED ORDER — PREDNISONE 20 MG PO TABS: ORAL_TABLET | ORAL | Status: DC

## 2010-09-21 NOTE — Progress Notes (Signed)
  Subjective:    Patient ID: [name] Woods, male    DOB: 1940/10/21, 70 y.o.   MRN: [id number]  HPI [name] Woods is a 70 year old male, who comes in with a week history of cough.  He and his son due home renovations and about a week ago.  They both began coughing.  They been around a lot of mold.  He has a history of allergic rhinitis in the past   Review of Systems    General and pulmonary review of systems otherwise negative Objective:   Physical Exam Well-developed well-nourished, male in no acute distress.  Examination of the HEENT were negative except for 3+ nasal edema.  Neck was supple.  No adenopathy.  Lungs were clear except on forced expiration.  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  Plan prednisone burst and taper

## 2010-09-21 NOTE — Patient Instructions (Signed)
Take the prednisone as directed.  Return p.r.n. 

## 2010-10-10 ENCOUNTER — Other Ambulatory Visit: Payer: Self-pay | Admitting: Family Medicine

## 2010-10-26 ENCOUNTER — Telehealth: Payer: Self-pay | Admitting: Family Medicine

## 2010-10-26 NOTE — Telephone Encounter (Signed)
We would only recommend this if you are having diarrhea.......... Not contagious....... Clostridium difficile is an antibiotic-induced problem

## 2010-10-26 NOTE — Telephone Encounter (Signed)
Pts spouse was dx with CDIFF and now pt is needing to get a test kit, in order to see if he has it. Pls call.

## 2010-10-26 NOTE — Telephone Encounter (Signed)
patient  Is aware 

## 2010-10-27 ENCOUNTER — Telehealth: Payer: Self-pay | Admitting: *Deleted

## 2010-10-27 ENCOUNTER — Encounter: Payer: Self-pay | Admitting: *Deleted

## 2010-10-27 NOTE — Telephone Encounter (Signed)
Spoke with pt's wife; we have scheduled him to see Dr Jarold Motto on 11/07/10 at 3pm. I will mail an assessment form and an appt reminder. Lynden Ang stated understanding.

## 2010-11-03 ENCOUNTER — Telehealth: Payer: Self-pay | Admitting: *Deleted

## 2010-11-03 DIAGNOSIS — R197 Diarrhea, unspecified: Secondary | ICD-10-CM

## 2010-11-03 NOTE — Telephone Encounter (Signed)
Addended by: Florene Glen on: 11/03/2010 02:47 PM   Modules accepted: Orders

## 2010-11-03 NOTE — Telephone Encounter (Signed)
Notified pt that we were checking his chart and updating his records, and he has a hx with Dr Russella Dar. Pt admits he had a COLON by him. His wife is who made the appt and she didn't mention his hx. Asked pt how bad his diarrhea is and he stated he's had some diarrhea all his life and since his Cholecystectomy, he has maybe a little more. His wife is concerned about the CDIFF. Explained Dr Russella Dar has no openings until 11/29/10 at 1000am. Pt scheduled for then and will call for worsening problems.

## 2010-11-03 NOTE — Telephone Encounter (Signed)
Notified pt Dr Russella Dar approved the CDIFF lab; pt will bring in sample.

## 2010-11-03 NOTE — Telephone Encounter (Signed)
Dr Russella Dar, pt called back to ask if he can go ahead and be tested for CDIFF prior to his visit on 11/29/10? Thanks.

## 2010-11-03 NOTE — Telephone Encounter (Signed)
OK to send C Diff PCR

## 2010-11-06 ENCOUNTER — Other Ambulatory Visit: Payer: Medicare Other

## 2010-11-06 DIAGNOSIS — R197 Diarrhea, unspecified: Secondary | ICD-10-CM

## 2010-11-07 ENCOUNTER — Ambulatory Visit: Payer: Medicare Other | Admitting: Gastroenterology

## 2010-11-07 LAB — CLOSTRIDIUM DIFFICILE BY PCR: Toxigenic C. Difficile by PCR: NOT DETECTED

## 2010-11-29 ENCOUNTER — Ambulatory Visit (INDEPENDENT_AMBULATORY_CARE_PROVIDER_SITE_OTHER): Payer: Medicare Other | Admitting: Gastroenterology

## 2010-11-29 ENCOUNTER — Encounter: Payer: Self-pay | Admitting: Gastroenterology

## 2010-11-29 ENCOUNTER — Encounter: Payer: Self-pay | Admitting: Internal Medicine

## 2010-11-29 VITALS — BP 136/74 | HR 60 | Ht 72.0 in | Wt 216.0 lb

## 2010-11-29 DIAGNOSIS — K219 Gastro-esophageal reflux disease without esophagitis: Secondary | ICD-10-CM

## 2010-11-29 DIAGNOSIS — R197 Diarrhea, unspecified: Secondary | ICD-10-CM

## 2010-11-29 MED ORDER — OMEPRAZOLE 20 MG PO CPDR: 20.0000 mg | DELAYED_RELEASE_CAPSULE | Freq: Every day | ORAL | Status: DC

## 2010-11-29 NOTE — Progress Notes (Signed)
History of Present Illness: This is a 70 year old male with a history of frequent diarrhea since his cholecystectomy in 2005. He last underwent colonoscopy in October 2008. He takes Imodium frequently with good control of his diarrhea however this occasionally leads to mild constipation. His wife had C. difficile diagnosed earlier this year and was treated for relapse. She is now disease-free. His diarrhea pattern has not changed but he was concerned that he might have contracted C. difficile. Stool studies done prior to this office visit were negative for C. difficile. He has frequent nighttime reflux symptoms. Denies weight loss, abdominal pain, constipation, change in stool caliber, melena, hematochezia, nausea, vomiting, dysphagia, chest pain.  Review of Systems: Pertinent positive and negative review of systems were noted in the above HPI section. All other review of systems were otherwise negative.  Current Medications, Allergies, Past Medical History, Past Surgical History, Family History and Social History were reviewed in [employer] record.  Physical Exam: General: Well developed , well nourished, no acute distress Head: Normocephalic and atraumatic Eyes:  sclerae anicteric, EOMI Ears: Normal auditory acuity Mouth: No deformity or lesions Neck: Supple, no masses or thyromegaly Lungs: Clear throughout to auscultation Heart: Regular rate and rhythm; no murmurs, rubs or bruits Abdomen: Soft, non tender and non distended. No masses, hepatosplenomegaly or hernias noted. Normal Bowel sounds Musculoskeletal: Symmetrical with no gross deformities  Skin: No lesions on visible extremities Pulses:  Normal pulses noted Extremities: No clubbing, cyanosis, edema or deformities noted Neurological: Alert oriented x 4, grossly nonfocal Cervical Nodes:  No significant cervical adenopathy Inguinal Nodes: No significant inguinal adenopathy Psychological:  Alert and cooperative.  Normal mood and affect  Assessment and Recommendations:  1. Chronic intermittent diarrhea. Presumed post postcholecystectomy diarrhea. Continue Imodium twice a day when necessary.  2. Personal history of adenomatous colon polyps. Surveillance colonoscopy due October 2013.  3. GERD. Standard antireflux measures and omeprazole 20 mg daily. If his symptoms are not well controlled consider increasing his PPIs dosage and proceeding with upper endoscopy.

## 2010-11-29 NOTE — Progress Notes (Signed)
Addended by: Jessee Avers on: 11/29/2010 11:56 AM   Modules accepted: Orders

## 2010-11-29 NOTE — Patient Instructions (Addendum)
Take Imodium twice daily as needed for diarrhea.  cc: Kelle Darting, MD

## 2010-12-05 ENCOUNTER — Other Ambulatory Visit: Payer: Self-pay | Admitting: Family Medicine

## 2011-02-05 ENCOUNTER — Ambulatory Visit (INDEPENDENT_AMBULATORY_CARE_PROVIDER_SITE_OTHER): Payer: Medicare Other | Admitting: Family Medicine

## 2011-02-05 ENCOUNTER — Encounter: Payer: Self-pay | Admitting: Family Medicine

## 2011-02-05 DIAGNOSIS — M7022 Olecranon bursitis, left elbow: Secondary | ICD-10-CM

## 2011-02-05 DIAGNOSIS — R946 Abnormal results of thyroid function studies: Secondary | ICD-10-CM

## 2011-02-05 DIAGNOSIS — E0590 Thyrotoxicosis, unspecified without thyrotoxic crisis or storm: Secondary | ICD-10-CM | POA: Diagnosis not present

## 2011-02-05 DIAGNOSIS — M7020 Olecranon bursitis, unspecified elbow: Secondary | ICD-10-CM

## 2011-02-05 LAB — URIC ACID: Uric Acid, Serum: 8.7 mg/dL — ABNORMAL HIGH (ref 4.0–7.8)

## 2011-02-05 LAB — T3, FREE: T3, Free: 2.5 pg/mL (ref 2.3–4.2)

## 2011-02-05 MED ORDER — PREDNISONE 20 MG PO TABS: ORAL_TABLET | ORAL | Status: DC

## 2011-02-05 NOTE — Progress Notes (Signed)
  Subjective:    Patient ID: [name] Woods, male    DOB: [birth year], 71 y.o.   MRN: [id number]  [name] Woods is a 71 year old, married male, nonsmoker, who comes in today for evaluation of pain and swelling of his right elbow and hand.  He states on Saturday evening he noticed swelling of his left elbow and last night.  His left hand began to swell.  He has had episodes like this in the past.  His uric acid levels have been normal.  Also in reviewing his labs he had some abnormal thyroid studies.  These need to be repeated.    Review of Systems    General an orthopedic review of systems otherwise negative.  No history of trauma, insect bite, etc. Objective:   Physical Exam  Well-developed well-nourished man in no acute distress.  Examination of right elbow shows swelling and redness.  Also, the hand shows swelling and redness too.        Assessment & Plan:  Pain and swelling right elbow and hand, consistent with gout however, previous uric acid levels have been normal.  Plan check uric acid level.  Follow-up begin prednisone recheck in 3 days.  Elevated blood pressure follow-up.  History of abnormal thyroid studies, repeat

## 2011-02-05 NOTE — Patient Instructions (Signed)
Take the prednisone as directed.  Return on Thursday for follow-up.  Check a blood pressure 3 times daily at home and returned with the data.  Also take a half or a full Vicodin every 4 to 6 hours as needed for severe pain.  Elevation and ice for your right elbow and hand.  Will also help decrease the pain and swelling

## 2011-02-07 ENCOUNTER — Other Ambulatory Visit: Payer: Self-pay | Admitting: *Deleted

## 2011-02-07 MED ORDER — ALLOPURINOL 300 MG PO TABS: 300.0000 mg | ORAL_TABLET | Freq: Every day | ORAL | Status: DC

## 2011-02-08 ENCOUNTER — Ambulatory Visit: Payer: Medicare Other | Admitting: Family Medicine

## 2011-02-23 ENCOUNTER — Telehealth: Payer: Self-pay | Admitting: *Deleted

## 2011-02-23 ENCOUNTER — Encounter: Payer: Self-pay | Admitting: Internal Medicine

## 2011-02-23 ENCOUNTER — Ambulatory Visit (INDEPENDENT_AMBULATORY_CARE_PROVIDER_SITE_OTHER): Payer: Medicare Other | Admitting: Internal Medicine

## 2011-02-23 VITALS — BP 122/70 | HR 60 | Temp 97.8°F | Wt 220.0 lb

## 2011-02-23 DIAGNOSIS — M109 Gout, unspecified: Secondary | ICD-10-CM | POA: Diagnosis not present

## 2011-02-23 MED ORDER — HYDROCODONE-ACETAMINOPHEN 7.5-750 MG PO TABS: 1.0000 | ORAL_TABLET | Freq: Three times a day (TID) | ORAL | Status: DC | PRN

## 2011-02-23 MED ORDER — PREDNISONE 10 MG PO TABS: 10.0000 mg | ORAL_TABLET | Freq: Every day | ORAL | Status: DC

## 2011-02-23 NOTE — Telephone Encounter (Signed)
Hand is much worse than when he saw Dr Tawanna Cooler 2 weeks ago.  Will come straight over to see Dr. Kirtland Bouchard today.

## 2011-02-23 NOTE — Patient Instructions (Signed)
Call or return to clinic prn if these symptoms worsen or fail to improve as anticipated.   Hold Allopurinol at this time and resume in 3 weeks

## 2011-02-23 NOTE — Progress Notes (Signed)
  Subjective:    Patient ID: [name] Woods, male    DOB: February 03, 1940, 71 y.o.   MRN: [id number]  HPI  71 year old patient who has a prior history of gouty arthritis he was seen a couple of weeks ago with acute pain and swelling involving his right elbow and especially the right hand. He was placed on a prednisone taper and initially improved but since medications have been tapered and discontinued he has had increasing pain involving the right hand. A uric acid level was obtained which was elevated and he has recently placed on allopurinol therapy. No fever or systemic complaints. No history of trauma   Review of Systems  Constitutional: Negative for fever, chills and fatigue.  Musculoskeletal: Positive for joint swelling.       Objective:   Physical Exam  Constitutional: He appears well-developed and well-nourished. No distress.  Musculoskeletal: He exhibits edema and tenderness.       The right elbow was unremarkable. He had significant soft tissue swelling about the right hand and wrist. Pain was slightly warm to touch with decreased range of motion          Assessment & Plan:   Probable gouty arthritis. The patient had a partial response to the prednisone but now with worsening pain and swelling. We'll retreat with prednisone for 7 days. We'll place on allopurinol on hold and resume in 3 weeks. Analgesics refill

## 2011-03-05 ENCOUNTER — Encounter: Payer: Self-pay | Admitting: Family Medicine

## 2011-03-05 ENCOUNTER — Ambulatory Visit (INDEPENDENT_AMBULATORY_CARE_PROVIDER_SITE_OTHER): Payer: Medicare Other | Admitting: Family Medicine

## 2011-03-05 DIAGNOSIS — M7022 Olecranon bursitis, left elbow: Secondary | ICD-10-CM

## 2011-03-05 DIAGNOSIS — M7020 Olecranon bursitis, unspecified elbow: Secondary | ICD-10-CM | POA: Diagnosis not present

## 2011-03-05 DIAGNOSIS — M109 Gout, unspecified: Secondary | ICD-10-CM | POA: Insufficient documentation

## 2011-03-05 MED ORDER — PREDNISONE 20 MG PO TABS: ORAL_TABLET | ORAL | Status: DC

## 2011-03-05 MED ORDER — HYDROCODONE-ACETAMINOPHEN 7.5-750 MG PO TABS: 1.0000 | ORAL_TABLET | Freq: Three times a day (TID) | ORAL | Status: DC | PRN

## 2011-03-05 NOTE — Patient Instructions (Signed)
Take 3 prednisone tablets now then 2 tabs daily until you're completely pain-free and the swelling and redness are gone then begin to taper  Vicodin one half to one tablet every 4-6 hours as needed for pain  Elevation and ice  We will get you set up for a rheumatology consult ASAP

## 2011-03-05 NOTE — Progress Notes (Signed)
  Subjective:    Patient ID: [name] Woods, male    DOB: 1940/06/09, 71 y.o.   MRN: [id number]  HPI [name] Woods is a 71 year old male who comes in today for followup of gout  We saw him a couple weeks ago with symptoms consistent with gout.,,,,,,,,,, level was elevated 8.7. We started him on allopurinol and a short course of prednisone. symptoms decreased in his right hand but never went away. He saw Dr. Kirtland Bouchard. 10 days ago who stopped the allopurinol gave him 20 mg of prednisone daily for one week. He stopped the prednisone last Friday the swelling and redness and pain in his hand was almost gone and now it's back.   Review of Systems    general and metabolic review of systems otherwise negative Objective:   Physical Exam  Well-developed well-nourished male no acute distress examination the right hand shows and to be red hot and swollen      Assessment & Plan:  Acute gout plan restart prednisone rheumatologic consult for evaluation

## 2011-03-12 ENCOUNTER — Telehealth: Payer: Self-pay | Admitting: *Deleted

## 2011-03-12 NOTE — Telephone Encounter (Signed)
Stop the prednisone completely and call Dr. Albertha Ghee at  orthopedics for further evaluation, or if he has seen an orthopedist then call them. Another option is to see Dr. Dareen Piano the rheumatologist

## 2011-03-12 NOTE — Telephone Encounter (Signed)
Wife is calling because the patient is becoming violent with the prednisone and abusive.  Can the patient stop the prednisone and try something else?

## 2011-03-12 NOTE — Telephone Encounter (Signed)
Patient is aware. Left message on machine for wife.

## 2011-03-14 ENCOUNTER — Other Ambulatory Visit: Payer: Self-pay | Admitting: Family Medicine

## 2011-03-29 DIAGNOSIS — M25549 Pain in joints of unspecified hand: Secondary | ICD-10-CM | POA: Diagnosis not present

## 2011-03-29 DIAGNOSIS — M2550 Pain in unspecified joint: Secondary | ICD-10-CM | POA: Diagnosis not present

## 2011-05-01 ENCOUNTER — Other Ambulatory Visit: Payer: Self-pay | Admitting: *Deleted

## 2011-05-01 DIAGNOSIS — M109 Gout, unspecified: Secondary | ICD-10-CM

## 2011-05-01 MED ORDER — HYDROCODONE-ACETAMINOPHEN 7.5-750 MG PO TABS: 1.0000 | ORAL_TABLET | Freq: Three times a day (TID) | ORAL | Status: DC | PRN

## 2011-05-14 ENCOUNTER — Telehealth: Payer: Self-pay | Admitting: Family Medicine

## 2011-05-14 MED ORDER — METOPROLOL SUCCINATE ER 50 MG PO TB24: 50.0000 mg | ORAL_TABLET | Freq: Every day | ORAL | Status: DC

## 2011-05-14 NOTE — Telephone Encounter (Signed)
Pt requesting refill on metoprolol (TOPROL-XL) 50 MG 24 hr tablet  [facility] [address]

## 2011-05-16 DIAGNOSIS — M1A00X1 Idiopathic chronic gout, unspecified site, with tophus (tophi): Secondary | ICD-10-CM | POA: Diagnosis not present

## 2011-05-16 DIAGNOSIS — M25579 Pain in unspecified ankle and joints of unspecified foot: Secondary | ICD-10-CM | POA: Diagnosis not present

## 2011-05-22 ENCOUNTER — Other Ambulatory Visit: Payer: Self-pay | Admitting: Family Medicine

## 2011-05-24 ENCOUNTER — Telehealth: Payer: Self-pay | Admitting: Family Medicine

## 2011-05-24 NOTE — Telephone Encounter (Signed)
[facility] called because the patient has received a refill of Vicodin on 05/22/11 of #120 from Dr Ewell Poe office.  Our refill was denied.

## 2011-06-18 DIAGNOSIS — I10 Essential (primary) hypertension: Secondary | ICD-10-CM | POA: Diagnosis not present

## 2011-06-18 DIAGNOSIS — I4891 Unspecified atrial fibrillation: Secondary | ICD-10-CM | POA: Diagnosis not present

## 2011-06-18 DIAGNOSIS — E782 Mixed hyperlipidemia: Secondary | ICD-10-CM | POA: Diagnosis not present

## 2011-06-18 DIAGNOSIS — Z79899 Other long term (current) drug therapy: Secondary | ICD-10-CM | POA: Diagnosis not present

## 2011-07-12 ENCOUNTER — Telehealth: Payer: Self-pay | Admitting: Family Medicine

## 2011-07-12 MED ORDER — DILTIAZEM HCL ER COATED BEADS 300 MG PO TB24: 300.0000 mg | ORAL_TABLET | Freq: Every day | ORAL | Status: DC

## 2011-07-12 NOTE — Telephone Encounter (Signed)
Pt requesting to have rx called into a different pharmacy diltiazem (CARDIZEM LA) 300 MG 24 hr tablet   cone out pt pharmacy

## 2011-08-24 ENCOUNTER — Other Ambulatory Visit: Payer: Self-pay | Admitting: *Deleted

## 2011-08-24 NOTE — Telephone Encounter (Signed)
Hydrocodone last filled 05-22-11, #50 with 3 refills

## 2011-08-27 MED ORDER — HYDROCODONE-ACETAMINOPHEN 7.5-750 MG PO TABS: 1.0000 | ORAL_TABLET | Freq: Three times a day (TID) | ORAL | Status: DC | PRN

## 2011-08-27 NOTE — Telephone Encounter (Signed)
Okay to refill as previous

## 2011-09-20 ENCOUNTER — Encounter: Payer: Self-pay | Admitting: Gastroenterology

## 2011-10-24 DIAGNOSIS — M1A00X1 Idiopathic chronic gout, unspecified site, with tophus (tophi): Secondary | ICD-10-CM | POA: Diagnosis not present

## 2011-11-12 ENCOUNTER — Ambulatory Visit (INDEPENDENT_AMBULATORY_CARE_PROVIDER_SITE_OTHER): Payer: Medicare Other | Admitting: Family Medicine

## 2011-11-12 ENCOUNTER — Encounter: Payer: Self-pay | Admitting: Family Medicine

## 2011-11-12 VITALS — BP 120/80 | Temp 98.1°F | Wt 226.0 lb

## 2011-11-12 DIAGNOSIS — G8929 Other chronic pain: Secondary | ICD-10-CM

## 2011-11-12 DIAGNOSIS — N139 Obstructive and reflux uropathy, unspecified: Secondary | ICD-10-CM | POA: Diagnosis not present

## 2011-11-12 DIAGNOSIS — M25569 Pain in unspecified knee: Secondary | ICD-10-CM | POA: Diagnosis not present

## 2011-11-12 DIAGNOSIS — M25561 Pain in right knee: Secondary | ICD-10-CM

## 2011-11-12 DIAGNOSIS — R946 Abnormal results of thyroid function studies: Secondary | ICD-10-CM

## 2011-11-12 DIAGNOSIS — N138 Other obstructive and reflux uropathy: Secondary | ICD-10-CM

## 2011-11-12 DIAGNOSIS — N401 Enlarged prostate with lower urinary tract symptoms: Secondary | ICD-10-CM

## 2011-11-12 DIAGNOSIS — M109 Gout, unspecified: Secondary | ICD-10-CM | POA: Diagnosis not present

## 2011-11-12 DIAGNOSIS — I10 Essential (primary) hypertension: Secondary | ICD-10-CM

## 2011-11-12 LAB — POCT URINALYSIS DIPSTICK
Glucose, UA: NEGATIVE
Ketones, UA: NEGATIVE
Leukocytes, UA: NEGATIVE
Protein, UA: NEGATIVE
Urobilinogen, UA: 0.2

## 2011-11-12 MED ORDER — ALLOPURINOL 300 MG PO TABS: 300.0000 mg | ORAL_TABLET | Freq: Every day | ORAL | Status: DC

## 2011-11-12 MED ORDER — HYDROCODONE-ACETAMINOPHEN 7.5-750 MG PO TABS: 1.0000 | ORAL_TABLET | Freq: Three times a day (TID) | ORAL | Status: DC | PRN

## 2011-11-12 MED ORDER — TRAMADOL HCL 50 MG PO TABS: 50.0000 mg | ORAL_TABLET | Freq: Three times a day (TID) | ORAL | Status: DC | PRN

## 2011-11-12 NOTE — Patient Instructions (Addendum)
Tramadol 50 mg,,,,,,,,,, 1/2-1 tablet 3 times daily for mild to moderate pain  Vicodin ES,,,,,,,,,,, 1/2-1 tablet 3 times daily for severe pain  I will discuss with the orthopedist how to deal with your chronic pain until you can have your knees replaced  Labs today  Set up a 30 minute appointment sometime in the next 4-6 weeks for general physical exam

## 2011-11-12 NOTE — Progress Notes (Signed)
  Subjective:    Patient ID: ROEN MACGOWAN, male    DOB: Jan 20, 1941, 71 y.o.   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 He spent a great orthopedics and has been evaluated by Dr. Tonie Griffith. He states daily wrote him a short course of pain medication and wants me to write his pain medicine for his chronic knee pain.  He describes his pain is in the severe category only relieved by Vicodin  It's been over a year since he had a physical examination  He takes allopurinol daily for gout and he is on a number of cardiac medicines by Dr. Gery Pray because of a history of atrial fib.   Review of Systems    general review of systems otherwise negative Objective:   Physical Exam Well-developed well-nourished male in no acute distress       Assessment & Plan:  Degenerative joint disease bilateral knee pain plan elevation ice ultrasound 50 mg 3 times a day from mild to moderate pain Vicodin when necessary for severe pain also contact Dr Charlann Boxer to discuss chronic pain therapy in the setting  Also will check labs because he is due for general physical

## 2011-11-13 LAB — CBC WITH DIFFERENTIAL/PLATELET
Eosinophils Relative: 0.5 % (ref 0.0–5.0)
Lymphocytes Relative: 14.6 % (ref 12.0–46.0)
MCV: 88.7 fl (ref 78.0–100.0)
Monocytes Absolute: 0.4 10*3/uL (ref 0.1–1.0)
Neutrophils Relative %: 81.7 % — ABNORMAL HIGH (ref 43.0–77.0)
Platelets: 226 10*3/uL (ref 150.0–400.0)
RBC: 5.39 Mil/uL (ref 4.22–5.81)
WBC: 14.2 10*3/uL — ABNORMAL HIGH (ref 4.5–10.5)

## 2011-11-13 LAB — LIPID PANEL
Cholesterol: 157 mg/dL (ref 0–200)
HDL: 35.3 mg/dL — ABNORMAL LOW (ref >39.00)
LDL Cholesterol: 96 mg/dL (ref 0–99)
VLDL: 25.8 mg/dL (ref 0.0–40.0)

## 2011-11-13 LAB — HEPATIC FUNCTION PANEL
ALT: 15 U/L (ref 0–53)
Albumin: 3.8 g/dL (ref 3.5–5.2)
Bilirubin, Direct: 0.2 mg/dL (ref 0.0–0.3)
Total Protein: 7.1 g/dL (ref 6.0–8.3)

## 2011-11-13 LAB — PSA: PSA: 1.76 ng/mL (ref 0.10–4.00)

## 2011-11-13 LAB — BASIC METABOLIC PANEL
CO2: 28 mEq/L (ref 19–32)
Calcium: 9.4 mg/dL (ref 8.4–10.5)
GFR: 46.21 mL/min — ABNORMAL LOW (ref >60.00)
Potassium: 5.2 mEq/L — ABNORMAL HIGH (ref 3.5–5.1)
Sodium: 141 mEq/L (ref 135–145)

## 2011-11-13 LAB — URIC ACID: Uric Acid, Serum: 5.3 mg/dL (ref 4.0–7.8)

## 2012-02-13 ENCOUNTER — Other Ambulatory Visit: Payer: Self-pay | Admitting: *Deleted

## 2012-02-13 MED ORDER — HYDROCODONE-ACETAMINOPHEN 7.5-325 MG PO TABS: 1.0000 | ORAL_TABLET | Freq: Three times a day (TID) | ORAL | Status: DC | PRN

## 2012-02-28 ENCOUNTER — Encounter: Payer: Self-pay | Admitting: Family Medicine

## 2012-02-28 ENCOUNTER — Ambulatory Visit (INDEPENDENT_AMBULATORY_CARE_PROVIDER_SITE_OTHER): Payer: Medicare Other | Admitting: Family Medicine

## 2012-02-28 VITALS — BP 110/80 | HR 78 | Temp 97.8°F | Wt 219.0 lb

## 2012-02-28 DIAGNOSIS — J069 Acute upper respiratory infection, unspecified: Secondary | ICD-10-CM | POA: Diagnosis not present

## 2012-02-28 DIAGNOSIS — M109 Gout, unspecified: Secondary | ICD-10-CM

## 2012-02-28 DIAGNOSIS — M7022 Olecranon bursitis, left elbow: Secondary | ICD-10-CM

## 2012-02-28 DIAGNOSIS — M7020 Olecranon bursitis, unspecified elbow: Secondary | ICD-10-CM

## 2012-02-28 MED ORDER — HYDROCODONE-HOMATROPINE 5-1.5 MG/5ML PO SYRP: 5.0000 mL | ORAL_SOLUTION | Freq: Three times a day (TID) | ORAL | Status: DC | PRN

## 2012-02-28 MED ORDER — PREDNISONE 20 MG PO TABS: ORAL_TABLET | ORAL | Status: DC

## 2012-02-28 NOTE — Patient Instructions (Signed)
Drink lots of water  Humidifier in your bedroom at night  Hydromet 1/2-1 teaspoon at bedtime when necessary for cough and cold symptoms  Return when necessary

## 2012-02-28 NOTE — Progress Notes (Signed)
  Subjective:    Patient ID: [name] Woods, male    DOB: 1940/04/09, 72 y.o.   MRN: [id number]  HPI [name] Woods is a 72 year old male married nonsmoker who comes in today with a week's plus history of head congestion sore throat and cough.  Review of systems otherwise negative except he wants a refill on the prednisone which he takes when he has a flare of his gout. He states he's taken the allopurinol daily   Review of Systems    review of systems negative Objective:   Physical Exam  Well-developed well-nourished male no acute distress HEENT were negative neck was supple no adenopathy lungs are clear      Assessment & Plan:

## 2012-02-29 ENCOUNTER — Other Ambulatory Visit: Payer: Self-pay | Admitting: Family Medicine

## 2012-03-01 HISTORY — PX: CARDIAC CATHETERIZATION: SHX172

## 2012-03-03 ENCOUNTER — Telehealth: Payer: Self-pay | Admitting: *Deleted

## 2012-03-03 NOTE — Telephone Encounter (Signed)
Patient is calling because he continues to cough and he has a lot of head congestion.  He would like to know if something else can be called in?

## 2012-03-04 MED ORDER — DOXYCYCLINE HYCLATE 100 MG PO TABS: 100.0000 mg | ORAL_TABLET | Freq: Two times a day (BID) | ORAL | Status: DC

## 2012-03-04 NOTE — Telephone Encounter (Signed)
Rest at home,,,,,,,,,,,,,,, continue 2  prednisone tablets daily  Continue cough syrup  Add doxycycline 100 mg twice a day dispense 20 tabs no refills  Return on Monday for followup if no better

## 2012-03-04 NOTE — Telephone Encounter (Signed)
Left message on machine for patient and Rx sent to pharmacy. 

## 2012-03-04 NOTE — Telephone Encounter (Signed)
Pt called back to follow up on request. Cough has gotten worse.  Marland Kitchen Pharm: [address]

## 2012-03-06 ENCOUNTER — Telehealth: Payer: Self-pay | Admitting: Family Medicine

## 2012-03-06 NOTE — Telephone Encounter (Signed)
Spoke with patient and reminded him to continue the prednisone, cough syrup, and antibiotics.  If no improvement call back.

## 2012-03-06 NOTE — Telephone Encounter (Signed)
Patient Information:  Caller Name: Jeramiah  Phone: [phone]  Patient: [name] Woods, [name] Woods  Gender: Male  DOB: 1940-08-28  Age: 72 Years  PCP: Kelle Darting [facility])  Office Follow Up:  Does the office need to follow up with this patient?: Yes  Instructions For The Office: Unable to make Appt with Dr Tawanna Cooler due to schedule hold.  Please review and call patient for Appt.   Symptoms  Reason For Call & Symptoms: Cough with congestion started 3 weeks ago, seen in office 1/30, started cough med.  Not improving so called office Tues 2/4 and started Dosycycline, fever during that following night..  Lot of coughing especially at night.  Waking 2 am and again 6am with cough last night 2/5, the last episode with choking, difficulty breathing, wheezing that finally calmed down.  Coughing up lot of clear mucus  Reviewed Health History In EMR: Yes  Reviewed Medications In EMR: Yes  Reviewed Allergies In EMR: Yes  Reviewed Surgeries / Procedures: Yes  Date of Onset of Symptoms: 02/14/2012  Treatments Tried: Cough syrup, coguh drips  Treatments Tried Worked: No  Guideline(s) Used:  Cough  Disposition Per Guideline:   See Today or Tomorrow in Office  Reason For Disposition Reached:   Continuous (nonstop) coughing interferes with work or school and no improvement using cough treatment per Care Advice  Advice Given:  N/A

## 2012-03-10 ENCOUNTER — Encounter: Payer: Self-pay | Admitting: Family Medicine

## 2012-03-10 ENCOUNTER — Ambulatory Visit (INDEPENDENT_AMBULATORY_CARE_PROVIDER_SITE_OTHER): Payer: Medicare Other | Admitting: Family Medicine

## 2012-03-10 VITALS — BP 122/80 | HR 68 | Temp 98.0°F | Wt 220.0 lb

## 2012-03-10 DIAGNOSIS — J069 Acute upper respiratory infection, unspecified: Secondary | ICD-10-CM | POA: Diagnosis not present

## 2012-03-10 DIAGNOSIS — M109 Gout, unspecified: Secondary | ICD-10-CM | POA: Diagnosis not present

## 2012-03-10 MED ORDER — PREDNISONE 20 MG PO TABS: ORAL_TABLET | ORAL | Status: DC

## 2012-03-10 NOTE — Progress Notes (Signed)
  Subjective:    Patient ID: [name] Woods, male    DOB: 09-30-1940, 72 y.o.   MRN: [id number]  HPI [name] is a 72 year old married male nonsmoker who comes in today for followup of a cough  We saw him a couple weeks ago he had a viral syndrome which cleared but the cough persisted. Exam at that time was negative no wheezing and we gave him a short course of prednisone to take however his wife would not let him take the medication because she states he has side effects to medication and develops mood changes.  He continues to cough   Review of Systems    review of systems negative no fever Objective:   Physical Exam Well-developed and nourished male no acute distress cardiopulmonary exam normal       Assessment & Plan:

## 2012-03-10 NOTE — Patient Instructions (Signed)
Let's go ahead and take the prednisone but let's decrease the dose to see if we can minimize the side effects  If you wish not to take the prednisone or the prednisone does not help or you would like to get a second opinion c the and we will get you set up for a pulmonary consult

## 2012-03-14 ENCOUNTER — Emergency Department (HOSPITAL_BASED_OUTPATIENT_CLINIC_OR_DEPARTMENT_OTHER)
Admission: EM | Admit: 2012-03-14 | Discharge: 2012-03-14 | Disposition: A | Discharge status: Home / Self Care | Payer: Medicare Other | Attending: Emergency Medicine | Admitting: Emergency Medicine

## 2012-03-14 ENCOUNTER — Emergency Department (HOSPITAL_BASED_OUTPATIENT_CLINIC_OR_DEPARTMENT_OTHER): Payer: Medicare Other

## 2012-03-14 ENCOUNTER — Encounter (HOSPITAL_BASED_OUTPATIENT_CLINIC_OR_DEPARTMENT_OTHER): Payer: Self-pay

## 2012-03-14 DIAGNOSIS — Z8739 Personal history of other diseases of the musculoskeletal system and connective tissue: Secondary | ICD-10-CM | POA: Insufficient documentation

## 2012-03-14 DIAGNOSIS — Z87442 Personal history of urinary calculi: Secondary | ICD-10-CM | POA: Diagnosis not present

## 2012-03-14 DIAGNOSIS — K219 Gastro-esophageal reflux disease without esophagitis: Secondary | ICD-10-CM | POA: Insufficient documentation

## 2012-03-14 DIAGNOSIS — R799 Abnormal finding of blood chemistry, unspecified: Secondary | ICD-10-CM | POA: Insufficient documentation

## 2012-03-14 DIAGNOSIS — I10 Essential (primary) hypertension: Secondary | ICD-10-CM | POA: Insufficient documentation

## 2012-03-14 DIAGNOSIS — Z79899 Other long term (current) drug therapy: Secondary | ICD-10-CM | POA: Diagnosis not present

## 2012-03-14 DIAGNOSIS — R05 Cough: Secondary | ICD-10-CM | POA: Diagnosis not present

## 2012-03-14 DIAGNOSIS — R7989 Other specified abnormal findings of blood chemistry: Secondary | ICD-10-CM

## 2012-03-14 DIAGNOSIS — J40 Bronchitis, not specified as acute or chronic: Secondary | ICD-10-CM | POA: Diagnosis not present

## 2012-03-14 DIAGNOSIS — I4891 Unspecified atrial fibrillation: Secondary | ICD-10-CM | POA: Diagnosis not present

## 2012-03-14 DIAGNOSIS — Z8679 Personal history of other diseases of the circulatory system: Secondary | ICD-10-CM | POA: Diagnosis not present

## 2012-03-14 DIAGNOSIS — Z8719 Personal history of other diseases of the digestive system: Secondary | ICD-10-CM | POA: Insufficient documentation

## 2012-03-14 DIAGNOSIS — J209 Acute bronchitis, unspecified: Secondary | ICD-10-CM | POA: Diagnosis not present

## 2012-03-14 DIAGNOSIS — Z7982 Long term (current) use of aspirin: Secondary | ICD-10-CM | POA: Insufficient documentation

## 2012-03-14 DIAGNOSIS — R059 Cough, unspecified: Secondary | ICD-10-CM | POA: Diagnosis not present

## 2012-03-14 LAB — COMPREHENSIVE METABOLIC PANEL
ALT: 25 U/L (ref 0–53)
AST: 28 U/L (ref 0–37)
Albumin: 3.4 g/dL — ABNORMAL LOW (ref 3.5–5.2)
Calcium: 9.3 mg/dL (ref 8.4–10.5)
GFR calc Af Amer: 57 mL/min — ABNORMAL LOW (ref >90)
Glucose, Bld: 95 mg/dL (ref 70–99)
Sodium: 139 mEq/L (ref 135–145)
Total Protein: 6.2 g/dL (ref 6.0–8.3)

## 2012-03-14 LAB — CBC WITH DIFFERENTIAL/PLATELET
Eosinophils Relative: 0 % (ref 0–5)
MCH: 29 pg (ref 26.0–34.0)
Monocytes Absolute: 0.1 10*3/uL (ref 0.1–1.0)
Neutrophils Relative %: 86 % — ABNORMAL HIGH (ref 43–77)
Platelets: 222 10*3/uL (ref 150–400)
RBC: 5.89 MIL/uL — ABNORMAL HIGH (ref 4.22–5.81)
WBC: 14.2 10*3/uL — ABNORMAL HIGH (ref 4.0–10.5)

## 2012-03-14 MED ORDER — FUROSEMIDE 20 MG PO TABS: 20.0000 mg | ORAL_TABLET | Freq: Every day | ORAL | Status: DC

## 2012-03-14 MED ORDER — AZITHROMYCIN 250 MG PO TABS: 250.0000 mg | ORAL_TABLET | Freq: Every day | ORAL | Status: DC

## 2012-03-14 NOTE — ED Notes (Signed)
Patient transported to X-ray 

## 2012-03-14 NOTE — ED Provider Notes (Signed)
History     CSN: [id number]  Arrival date & time 03/14/12  1113   First MD Initiated Contact with Patient 03/14/12 1144      Chief Complaint  Patient presents with  . Cough    (Consider location/radiation/quality/duration/timing/severity/associated sxs/prior treatment) HPI Comments: Patient presents with a 5 week history of waking up at night coughing and feeling as if he can't catch his breath.  On several occasions he has coughed up a large amount of mucus.  He was started on doxycycline and prednisone recently by his pcp but this has not helped.  He denies chest pain or fever.  He is a non-smoker and has no history of copd or chf.  He does have a history of Afib but no other cardiac problems.  Patient is a 72 y.o. male presenting with cough. The history is provided by the patient.  Cough Cough characteristics:  Productive Sputum characteristics:  White and frothy Severity:  Moderate Onset quality:  Sudden Timing:  Intermittent   Past Medical History  Diagnosis Date  . Ventricular hypertrophy   . Paroxysmal atrial fibrillation   . Hiatal hernia   . 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 Cholelithiasis   . Sleep apnea   . Nephrolithiasis   . Arthritis   . Cardiac arrhythmia   . Hypertension   . Ankylosing spondylitis   . Atrial fibrillation   . GERD (gastroesophageal reflux disease)     Past Surgical History  Procedure Laterality Date  . Cholecystectomy      Family History  Problem Relation Age of Onset  . Colon cancer Neg Hx   . Heart disease Father   . Heart disease Brother     History  Substance Use Topics  . Smoking status: Never Smoker   . Smokeless tobacco: Never Used  . Alcohol Use: No      Review of Systems  Respiratory: Positive for cough.   All other systems reviewed and are negative.    Allergies  Review of patient's allergies indicates no known allergies.  Home Medications   Current Outpatient Rx  Name  Route  Sig  Dispense  Refill   . allopurinol (ZYLOPRIM) 300 MG tablet      TAKE ONE TABLET BY MOUTH EVERY DAY   100 tablet   2   . aspirin 325 MG tablet   Oral   Take 325 mg by mouth daily.         . calcium carbonate (TUMS) 500 MG chewable tablet   Oral   Chew 1 tablet by mouth as needed.          . cyclobenzaprine (FLEXERIL) 10 MG tablet   Oral   Take 10 mg by mouth 2 (two) times daily as needed.           . diltiazem (CARDIZEM LA) 300 MG 24 hr tablet   Oral   Take 1 tablet (300 mg total) by mouth daily.   100 tablet   3   . doxycycline (VIBRA-TABS) 100 MG tablet   Oral   Take 1 tablet (100 mg total) by mouth 2 (two) times daily.   20 tablet   0   . dronedarone (MULTAQ) 400 MG tablet   Oral   Take 400 mg by mouth 2 (two) times daily with a meal.           . HYDROcodone-acetaminophen (NORCO) 7.5-325 MG per tablet   Oral   Take 1 tablet by mouth every 8 (  eight) hours as needed.   100 tablet   1   . HYDROcodone-homatropine (HYCODAN) 5-1.5 MG/5ML syrup   Oral   Take 5 mLs by mouth every 8 (eight) hours as needed for cough.   120 mL   1   . lisinopril (PRINIVIL,ZESTRIL) 40 MG tablet      TAKE 1 AND  1/2 TABLET BY MOUTH EVERY MORNING   150 tablet   3   . Loperamide HCl (IMODIUM PO)   Oral   Take by mouth as needed.           . metoprolol succinate (TOPROL-XL) 50 MG 24 hr tablet   Oral   Take 50 mg by mouth 2 (two) times daily.         Marland Kitchen EXPIRED: omeprazole (PRILOSEC) 20 MG capsule   Oral   Take 1 capsule (20 mg total) by mouth daily.   30 capsule   2   . predniSONE (DELTASONE) 20 MG tablet      1 tab x1 week, a half a tab x1 week, then a half a tablet Monday Wednesday Friday for a 3 week taper   30 tablet   1   . tadalafil (CIALIS) 20 MG tablet   Oral   Take 20 mg by mouth daily as needed.           . traMADol (ULTRAM) 50 MG tablet   Oral   Take 1 tablet (50 mg total) by mouth every 8 (eight) hours as needed for pain.   100 tablet   2     BP 149/73   Pulse 79  Temp(Src) 97.4 F (36.3 C) (Oral)  Resp 18  Ht 6' (1.829 m)  Wt 220 lb (99.791 kg)  BMI 29.83 kg/m2  SpO2 98%  Physical Exam  Nursing note and vitals reviewed. Constitutional: He is oriented to person, place, and time. He appears well-developed and well-nourished. No distress.  HENT:  Head: Normocephalic and atraumatic.  Mouth/Throat: Oropharynx is clear and moist.  Neck: Normal range of motion. Neck supple.  Cardiovascular: Normal rate and regular rhythm.   No murmur heard. Pulmonary/Chest: Effort normal and breath sounds normal. No respiratory distress. He has no wheezes.  Abdominal: Soft. Bowel sounds are normal. He exhibits no distension. There is no tenderness.  Musculoskeletal: Normal range of motion. He exhibits no edema.  Lymphadenopathy:    He has no cervical adenopathy.  Neurological: He is alert and oriented to person, place, and time.  Skin: Skin is warm and dry. He is not diaphoretic.    ED Course  Procedures (including critical care time)  Labs Reviewed  CBC WITH DIFFERENTIAL  COMPREHENSIVE METABOLIC PANEL  TROPONIN I  PRO B NATRIURETIC PEPTIDE   Dg Chest 2 View  03/14/2012  *RADIOLOGY REPORT*  Clinical Data: Cough for 5 weeks.  CHEST - 2 VIEW  Comparison: PA and lateral chest 04/15/2009 and 06/22/2008.  Findings: The lungs are clear.  Heart size is normal.  No pneumothorax or pleural fluid.  Flowing syndesmophytes throughout the thoracic spine are consistent with ankylosing spondylitis and are stable in appearance.  IMPRESSION: No acute disease.   Original Report Authenticated By: Holley Dexter, M.D.      No diagnosis found.    Date: 03/14/2012  Rate: 70  Rhythm: atrial fibrillation  QRS Axis: normal  Intervals: normal  ST/T Wave abnormalities: nonspecific T wave changes  Conduction Disutrbances:none  Narrative Interpretation:   Old EKG Reviewed: unchanged    MDM  The  patient presents here for evaluation of cough for the past 5  weeks that occurs primarily at night while he is sleeping.  Doxycycline does not seem to be helping.  I have considered chf, infection, sleep apnea, and reflux as the mail culprits.  The bnp is elevated at 700 but the chest xray does not show failure.  Will treat with zithromax and a low dose of lasix.  He needs to follow up with his pcp if not improving to discuss other possibilities, testing, referrals.  Return prn if he worsens.          Geoffery Lyons, MD 03/14/12 1536

## 2012-03-14 NOTE — ED Notes (Signed)
Pt reports he has had a cough x 5 weeks, seen by PMD and been taking prednisone, doxycycline and hycodan without relief.  He reports a choking sensation when coughing up sputum.

## 2012-03-16 ENCOUNTER — Other Ambulatory Visit: Payer: Self-pay

## 2012-03-19 DIAGNOSIS — I4891 Unspecified atrial fibrillation: Secondary | ICD-10-CM | POA: Diagnosis not present

## 2012-03-19 DIAGNOSIS — I2510 Atherosclerotic heart disease of native coronary artery without angina pectoris: Secondary | ICD-10-CM | POA: Diagnosis not present

## 2012-03-19 DIAGNOSIS — I10 Essential (primary) hypertension: Secondary | ICD-10-CM | POA: Diagnosis not present

## 2012-03-20 ENCOUNTER — Telehealth: Payer: Self-pay | Admitting: Family Medicine

## 2012-03-20 DIAGNOSIS — J069 Acute upper respiratory infection, unspecified: Secondary | ICD-10-CM

## 2012-03-20 NOTE — Telephone Encounter (Signed)
Referral request sent 

## 2012-03-20 NOTE — Telephone Encounter (Signed)
Patient Information:  Caller Name: Traeson  Phone: [phone]  Patient: [name] Woods, [name] Woods  Gender: Male  DOB: 05/18/1940  Age: 72 Years  PCP: Kelle Darting [facility])  Office Follow Up:  Does the office need to follow up with this patient?: Yes  Instructions For The Office: Patient wants to see Pulmonology today if possible. He triages to be seen today. He has been in office twice and to ER once. Concerned. Cleared by Cardiology yesterday  RN Note:  Patient wants Pulmonary Appt as soon as possible. Dr. Tawanna Cooler was going to schedule him. Would like to be seen ASAP. Please contact for appt.  Symptoms  Reason For Call & Symptoms: Patient states he has been coughing for six weeks. Seen in office on 03/03/12 and 03/10/12 with an ER visit 03/14/12.  He states he is still coughing. CXR clear and BNP was elevated. Saw Cardiology yesterday and told not his heart and it is Pulmonary.  He states Dr. Tawanna Cooler was going to make him a Pulmonology appt. He needs the referral.  Productive cough clear. "I can't breathe"  Worse with laying down and sleeping.  As long as he is up active he can cough it up. Wakes "gasping for air"  Reviewed Health History In EMR: Yes  Reviewed Medications In EMR: Yes  Reviewed Allergies In EMR: Yes  Reviewed Surgeries / Procedures: Yes  Date of Onset of Symptoms: 02/07/2012  Treatments Tried: Fluid pill-  Zpack(finished on Tuesday)  Treatments Tried Worked: No  Guideline(s) Used:  Breathing Difficulty  Disposition Per Guideline:   Go to Office Now  Reason For Disposition Reached:   Continuous (nonstop) coughing  Advice Given:  General Care Advice for Breathing Difficulty:  Find position of greatest comfort. For most patients the best position is semi-upright (e.g., sitting up in a comfortable chair or lying back against pillows).  Elevate head of bed (e.g., use pillows or place blocks under bed).  Avoid smoke or fume exposure.  Create a draft (e.g., use a fan directed  at the face, or open a window).  Keep room temperature slightly on the cool side.  Limit activities or space activities apart during the day. Prioritize activities.  Use a humidifier.  Call Back If:  Severe difficulty breathing occurs  Fever more than 100.5 F (38.1 C)  You become worse.  RN Overrode Recommendation:  Make Appointment  Patient would like appt with Pulmonology ASAP.  Triages to be seen today

## 2012-03-21 ENCOUNTER — Emergency Department (HOSPITAL_COMMUNITY): Payer: Medicare Other

## 2012-03-21 ENCOUNTER — Encounter (HOSPITAL_COMMUNITY): Payer: Self-pay | Admitting: Emergency Medicine

## 2012-03-21 ENCOUNTER — Observation Stay (HOSPITAL_COMMUNITY)
Admission: EM | Admit: 2012-03-21 | Discharge: 2012-03-25 | Disposition: A | Discharge status: Home / Self Care | Payer: Medicare Other | Attending: Cardiology | Admitting: Cardiology

## 2012-03-21 DIAGNOSIS — R079 Chest pain, unspecified: Secondary | ICD-10-CM | POA: Insufficient documentation

## 2012-03-21 DIAGNOSIS — I428 Other cardiomyopathies: Secondary | ICD-10-CM | POA: Insufficient documentation

## 2012-03-21 DIAGNOSIS — I480 Paroxysmal atrial fibrillation: Secondary | ICD-10-CM

## 2012-03-21 DIAGNOSIS — R5383 Other fatigue: Secondary | ICD-10-CM | POA: Diagnosis not present

## 2012-03-21 DIAGNOSIS — I2510 Atherosclerotic heart disease of native coronary artery without angina pectoris: Secondary | ICD-10-CM | POA: Insufficient documentation

## 2012-03-21 DIAGNOSIS — R5381 Other malaise: Secondary | ICD-10-CM | POA: Diagnosis not present

## 2012-03-21 DIAGNOSIS — I10 Essential (primary) hypertension: Secondary | ICD-10-CM | POA: Diagnosis present

## 2012-03-21 DIAGNOSIS — I509 Heart failure, unspecified: Secondary | ICD-10-CM | POA: Insufficient documentation

## 2012-03-21 DIAGNOSIS — I4891 Unspecified atrial fibrillation: Secondary | ICD-10-CM | POA: Insufficient documentation

## 2012-03-21 DIAGNOSIS — N189 Chronic kidney disease, unspecified: Secondary | ICD-10-CM | POA: Diagnosis not present

## 2012-03-21 DIAGNOSIS — R059 Cough, unspecified: Secondary | ICD-10-CM | POA: Insufficient documentation

## 2012-03-21 DIAGNOSIS — I129 Hypertensive chronic kidney disease with stage 1 through stage 4 chronic kidney disease, or unspecified chronic kidney disease: Secondary | ICD-10-CM | POA: Insufficient documentation

## 2012-03-21 DIAGNOSIS — Z7901 Long term (current) use of anticoagulants: Secondary | ICD-10-CM | POA: Insufficient documentation

## 2012-03-21 DIAGNOSIS — Z79899 Other long term (current) drug therapy: Secondary | ICD-10-CM | POA: Insufficient documentation

## 2012-03-21 DIAGNOSIS — R0989 Other specified symptoms and signs involving the circulatory and respiratory systems: Secondary | ICD-10-CM | POA: Insufficient documentation

## 2012-03-21 DIAGNOSIS — J45901 Unspecified asthma with (acute) exacerbation: Principal | ICD-10-CM | POA: Insufficient documentation

## 2012-03-21 DIAGNOSIS — R0609 Other forms of dyspnea: Secondary | ICD-10-CM

## 2012-03-21 DIAGNOSIS — K219 Gastro-esophageal reflux disease without esophagitis: Secondary | ICD-10-CM | POA: Insufficient documentation

## 2012-03-21 DIAGNOSIS — M459 Ankylosing spondylitis of unspecified sites in spine: Secondary | ICD-10-CM | POA: Insufficient documentation

## 2012-03-21 HISTORY — DX: Atherosclerotic heart disease of native coronary artery without angina pectoris: I25.10

## 2012-03-21 LAB — CBC WITH DIFFERENTIAL/PLATELET
Eosinophils Relative: 1 % (ref 0–5)
Lymphocytes Relative: 34 % (ref 12–46)
Lymphs Abs: 4.4 10*3/uL — ABNORMAL HIGH (ref 0.7–4.0)
MCV: 86.6 fL (ref 78.0–100.0)
Platelets: 205 10*3/uL (ref 150–400)
RBC: 5.82 MIL/uL — ABNORMAL HIGH (ref 4.22–5.81)
WBC: 13.2 10*3/uL — ABNORMAL HIGH (ref 4.0–10.5)

## 2012-03-21 LAB — BASIC METABOLIC PANEL
CO2: 27 mEq/L (ref 19–32)
Calcium: 9.3 mg/dL (ref 8.4–10.5)
Glucose, Bld: 90 mg/dL (ref 70–99)
Potassium: 4.1 mEq/L (ref 3.5–5.1)
Sodium: 140 mEq/L (ref 135–145)

## 2012-03-21 LAB — TROPONIN I: Troponin I: <0.3 ng/mL (ref <0.30)

## 2012-03-21 MED ORDER — NITROGLYCERIN 0.4 MG SL SUBL: 0.4000 mg | SUBLINGUAL_TABLET | SUBLINGUAL | Status: DC | PRN

## 2012-03-21 MED ORDER — APIXABAN 5 MG PO TABS
5.0000 mg | ORAL_TABLET | Freq: Two times a day (BID) | ORAL | Status: DC
  Administered 2012-03-21 – 2012-03-22 (×2): 5 mg via ORAL
  Filled 2012-03-21 (×3): qty 1

## 2012-03-21 MED ORDER — SODIUM CHLORIDE 0.9 % IJ SOLN
3.0000 mL | Freq: Two times a day (BID) | INTRAMUSCULAR | Status: DC
  Administered 2012-03-21 – 2012-03-25 (×6): 3 mL via INTRAVENOUS

## 2012-03-21 MED ORDER — PANTOPRAZOLE SODIUM 40 MG PO TBEC
40.0000 mg | DELAYED_RELEASE_TABLET | Freq: Every day | ORAL | Status: DC
  Administered 2012-03-21 – 2012-03-25 (×5): 40 mg via ORAL
  Filled 2012-03-21 (×4): qty 1

## 2012-03-21 MED ORDER — IPRATROPIUM BROMIDE 0.02 % IN SOLN
1.0000 mg | Freq: Once | RESPIRATORY_TRACT | Status: AC
  Administered 2012-03-21: 1 mg via RESPIRATORY_TRACT
  Filled 2012-03-21: qty 5

## 2012-03-21 MED ORDER — PREDNISONE 10 MG PO TABS
10.0000 mg | ORAL_TABLET | ORAL | Status: DC
  Administered 2012-03-24: 10 mg via ORAL
  Filled 2012-03-21: qty 1

## 2012-03-21 MED ORDER — ZOLPIDEM TARTRATE 5 MG PO TABS: 5.0000 mg | ORAL_TABLET | Freq: Every evening | ORAL | Status: DC | PRN

## 2012-03-21 MED ORDER — DILTIAZEM HCL ER COATED BEADS 180 MG PO CP24
180.0000 mg | ORAL_CAPSULE | Freq: Every day | ORAL | Status: DC
  Administered 2012-03-21 – 2012-03-23 (×3): 180 mg via ORAL
  Filled 2012-03-21 (×3): qty 1

## 2012-03-21 MED ORDER — ACETAMINOPHEN 325 MG PO TABS: 650.0000 mg | ORAL_TABLET | ORAL | Status: DC | PRN

## 2012-03-21 MED ORDER — ALBUTEROL SULFATE (5 MG/ML) 0.5% IN NEBU
10.0000 mg | INHALATION_SOLUTION | Freq: Once | RESPIRATORY_TRACT | Status: AC
  Administered 2012-03-21: 10 mg via RESPIRATORY_TRACT
  Filled 2012-03-21: qty 2

## 2012-03-21 MED ORDER — ALPRAZOLAM 0.25 MG PO TABS: 0.2500 mg | ORAL_TABLET | Freq: Two times a day (BID) | ORAL | Status: DC | PRN

## 2012-03-21 MED ORDER — SODIUM CHLORIDE 0.9 % IJ SOLN: 3.0000 mL | INTRAMUSCULAR | Status: DC | PRN

## 2012-03-21 MED ORDER — DRONEDARONE HCL 400 MG PO TABS
400.0000 mg | ORAL_TABLET | Freq: Two times a day (BID) | ORAL | Status: DC
  Administered 2012-03-22: 400 mg via ORAL
  Filled 2012-03-21 (×3): qty 1

## 2012-03-21 MED ORDER — ONDANSETRON HCL 4 MG/2ML IJ SOLN: 4.0000 mg | Freq: Four times a day (QID) | INTRAMUSCULAR | Status: DC | PRN

## 2012-03-21 MED ORDER — METOPROLOL SUCCINATE ER 50 MG PO TB24
50.0000 mg | ORAL_TABLET | Freq: Two times a day (BID) | ORAL | Status: DC
  Administered 2012-03-21 – 2012-03-23 (×4): 50 mg via ORAL
  Filled 2012-03-21 (×5): qty 1

## 2012-03-21 MED ORDER — FUROSEMIDE 20 MG PO TABS
20.0000 mg | ORAL_TABLET | Freq: Every day | ORAL | Status: DC
  Administered 2012-03-22 – 2012-03-25 (×4): 20 mg via ORAL
  Filled 2012-03-21 (×4): qty 1

## 2012-03-21 MED ORDER — PREDNISONE 10 MG PO TABS
10.0000 mg | ORAL_TABLET | Freq: Every day | ORAL | Status: DC
  Administered 2012-03-22 – 2012-03-23 (×2): 10 mg via ORAL
  Filled 2012-03-21: qty 2
  Filled 2012-03-21 (×3): qty 1

## 2012-03-21 MED ORDER — SODIUM CHLORIDE 0.9 % IV SOLN: 250.0000 mL | INTRAVENOUS | Status: DC | PRN

## 2012-03-21 NOTE — ED Provider Notes (Signed)
History     CSN: [id number]  Arrival date & time 03/21/12  1404   First MD Initiated Contact with Patient 03/21/12 1411      Chief Complaint  Patient presents with  . Chest Pain    (Consider location/radiation/quality/duration/timing/severity/associated sxs/prior treatment) The history is provided by the patient.  [name] Woods is a 72 y.o. male history of A. fib on aspirin, hypertension here presenting with chronic cough and chest pain. He has been having cough for the last 6 weeks that is not improving. He finished a course of steroids 2 weeks ago without improvement. Came a week ago and finished a course of azithromycin without improvement. He also has been taking Hycodan without improvement. He has chest pain when he coughs only. Denies any recent travel or history of PEs. Denies any fevers or chills   Past Medical History  Diagnosis Date  . Ventricular hypertrophy   . Paroxysmal atrial fibrillation   . Hiatal hernia   . 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 Cholelithiasis   . Sleep apnea   . Nephrolithiasis   . Arthritis   . Hypertension   . Ankylosing spondylitis   . Atrial fibrillation   . GERD (gastroesophageal reflux disease)   . CAD (coronary artery disease) 2003    30% mid LAD lesion on cardiac cath    Past Surgical History  Procedure Laterality Date  . Cholecystectomy    . Cardiac catheterization  03/2001    30% mid LAD lesion otherwise normal cors, LVEF 65%  . Tonsillectomy      Family History  Problem Relation Age of Onset  . Colon cancer Neg Hx   . Heart disease Father 9    MI- deceased  . Heart disease Brother 54    "Died in his sleep"    History  Substance Use Topics  . Smoking status: Never Smoker   . Smokeless tobacco: Never Used  . Alcohol Use: No      Review of Systems  Respiratory: Positive for cough.   Cardiovascular: Positive for chest pain.  All other systems reviewed and are negative.    Allergies  Review of patient's  allergies indicates no known allergies.  Home Medications   Current Outpatient Rx  Name  Route  Sig  Dispense  Refill  . aspirin 325 MG tablet   Oral   Take 325 mg by mouth daily.         Marland Kitchen diltiazem (CARTIA XT) 180 MG 24 hr capsule   Oral   Take 180 mg by mouth daily.         Marland Kitchen dronedarone (MULTAQ) 400 MG tablet   Oral   Take 400 mg by mouth 2 (two) times daily with a meal.           . furosemide (LASIX) 20 MG tablet   Oral   Take 1 tablet (20 mg total) by mouth daily.   15 tablet   0   . HYDROcodone-acetaminophen (NORCO) 7.5-325 MG per tablet   Oral   Take 1 tablet by mouth every 8 (eight) hours as needed.   100 tablet   1   . lisinopril (PRINIVIL,ZESTRIL) 40 MG tablet      TAKE 1 AND  1/2 TABLET BY MOUTH EVERY MORNING   150 tablet   3   . Loperamide HCl (IMODIUM PO)   Oral   Take by mouth as needed.           . metoprolol  succinate (TOPROL-XL) 50 MG 24 hr tablet   Oral   Take 50 mg by mouth 2 (two) times daily.         . predniSONE (DELTASONE) 20 MG tablet      1 tab x1 week, a half a tab x1 week, then a half a tablet Monday Wednesday Friday for a 3 week taper   30 tablet   1   . EXPIRED: omeprazole (PRILOSEC) 20 MG capsule   Oral   Take 1 capsule (20 mg total) by mouth daily.   30 capsule   2   . tadalafil (CIALIS) 20 MG tablet   Oral   Take 20 mg by mouth daily as needed.             BP 114/59  Pulse 82  Temp(Src) 97.8 F (36.6 C) (Oral)  Resp 20  SpO2 95%  Physical Exam  Nursing note and vitals reviewed. Constitutional: He is oriented to person, place, and time. He appears well-developed and well-nourished.  Coughing, slightly uncomfortable   HENT:  Head: Normocephalic.  Mouth/Throat: Oropharynx is clear and moist.  Eyes: Conjunctivae are normal. Pupils are equal, round, and reactive to light.  Neck: Normal range of motion. Neck supple.  Cardiovascular: Normal rate, regular rhythm and normal heart sounds.    Pulmonary/Chest: Effort normal.  Mod air movement. Minimal wheezing. No retractions.   Abdominal: Soft. Bowel sounds are normal. He exhibits no distension. There is no tenderness. There is no rebound.  Musculoskeletal: Normal range of motion. He exhibits no edema and no tenderness.  Neurological: He is alert and oriented to person, place, and time.  Skin: Skin is warm and dry.  Psychiatric: He has a normal mood and affect. His behavior is normal. Judgment and thought content normal.    ED Course  Procedures (including critical care time)  Labs Reviewed  CBC WITH DIFFERENTIAL - Abnormal; Notable for the following:    WBC 13.2 (*)    RBC 5.82 (*)    Hemoglobin 17.6 (*)    Lymphs Abs 4.4 (*)    All other components within normal limits  BASIC METABOLIC PANEL - Abnormal; Notable for the following:    BUN 29 (*)    Creatinine, Ser 1.63 (*)    GFR calc non Af Amer 41 (*)    GFR calc Af Amer 47 (*)    All other components within normal limits  PRO B NATRIURETIC PEPTIDE - Abnormal; Notable for the following:    Pro B Natriuretic peptide (BNP) 987.0 (*)    All other components within normal limits  TROPONIN I   Dg Chest 2 View  03/21/2012  *RADIOLOGY REPORT*  Clinical Data: Chest pain  CHEST - 2 VIEW  Comparison: 03/14/2012  Findings: Heart is upper limits normal in size.  No focal airspace opacities or effusions.  No acute bony abnormality.  IMPRESSION: No acute cardiopulmonary disease or change.   Original Report Authenticated By: Charlett Nose, M.D.      1. Cough      Date: 03/21/2012  Rate: 85  Rhythm: atrial fibrillation  QRS Axis: normal  Intervals: normal  ST/T Wave abnormalities: nonspecific ST changes  Conduction Disutrbances:none  Narrative Interpretation:   Old EKG Reviewed: unchanged    MDM  [name] Woods is a 72 y.o. male here with cough and chest pain with cough. I think he likely has bronchitis. BNP was elevated a week ago (700), will repeat it. Low  suspicion for ACS and  symptoms for weeks so trop x 1 sufficient. Will give albuterol and reassess.   5:19 PM Dr. Antoine Poche saw the patient. BNP now 987. He will be admitted to get echo to r/o heart failure.   `      Richardean Canal, MD 03/21/12 1719

## 2012-03-21 NOTE — ED Notes (Signed)
MD at bedside. 

## 2012-03-21 NOTE — ED Notes (Signed)
Patient transported to X-ray 

## 2012-03-21 NOTE — ED Notes (Signed)
Pt c/o mid sternal CP x several days worse with cough; pt sts productive cough with clear sputum x 6 weeks

## 2012-03-21 NOTE — H&P (Signed)
History and Physical   Patient ID: [name] Woods MRN: [id number], DOB/AGE: [birth year]   Admit date: 03/21/2012 Date of Consult: 03/21/2012  Primary Physician: Evette Georges, MD Primary Cardiologist: new to [place], previously Dr. Allyson Sabal  HPI: [name] Woods is a 72 y.o. male with PMHx s/f nonobstructive CAD (30% mid LAD lesion on 03/2001 cath), family history of premature CAD, PAF (on ASA), ankylosing spondylitis, HTN, GERD and hiatal hernia presenting to [facility] ED with persistent cough, dyspnea on exertion and progressive fatigue.    Exercise Myoview 03/2005: no evidence of myocardial ischemia or scar. Nongated study.   He reports a cough productive of white sputum x 6 weeks.He followed up with his PCP with these complaints. He was started on a prednisone taper and completed a course of doxycycline with minimal improvement. He notes associates diaphoresis. He presented to [facility] 1 week ago with these complaints. He was discharged on Lasix and azithroymycin for CHF and infectious etiologies. pBNP was 700s at that time. CXR revealed no evidence of CHF. Denies chest pain. He reports PND. No swelling. Does note increased abdominal distention. Weight has actually reduced after starting diuretic. He reports a "normal" stress test 2-3 years ago. He has been on Coumadin previously, but this was discontinued due to blood in urine. He has a history of nephrolithiasis. He presents to the ED due to persistent coughing, shortness of breath and progressive fatigue.   In the ED, EKG reveals atrial fibrillation, rate 85 bpm, unchanged inferolateral ST depressions. Labwork 2/14-  Initial trop-I WNL. pBNP 736.4. CXR on 03/14/12 demonstrated features c/w ankylosing spondylitis, otherwise no acute disease. CXR today revealed no evidence of acute abnormality. BMET- Cr 1.40. CBC- WBC 14.2, Hgb/Hct 17.1/50.9.   Problem List: Past Medical History  Diagnosis Date  . Ventricular hypertrophy   .  Paroxysmal atrial fibrillation   . Hiatal hernia   . 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 Cholelithiasis   . Sleep apnea   . Nephrolithiasis   . Arthritis   . Hypertension   . Ankylosing spondylitis   . Atrial fibrillation   . GERD (gastroesophageal reflux disease)   . CAD (coronary artery disease) 2003    30% mid LAD lesion on cardiac cath    Past Surgical History  Procedure Laterality Date  . Cholecystectomy    . Cardiac catheterization  03/2001    30% mid LAD lesion otherwise normal cors, LVEF 65%  . Tonsillectomy       Allergies: No Known Allergies  Home Medications: Prior to Admission medications   Medication Sig Start Date End Date Taking? Authorizing Provider  aspirin 325 MG tablet Take 325 mg by mouth daily.   Yes Historical Provider, MD  diltiazem (CARTIA XT) 180 MG 24 hr capsule Take 180 mg by mouth daily.   Yes Historical Provider, MD  dronedarone (MULTAQ) 400 MG tablet Take 400 mg by mouth 2 (two) times daily with a meal.     Yes Historical Provider, MD  furosemide (LASIX) 20 MG tablet Take 1 tablet (20 mg total) by mouth daily. 03/14/12  Yes Geoffery Lyons, MD  HYDROcodone-acetaminophen (NORCO) 7.5-325 MG per tablet Take 1 tablet by mouth every 8 (eight) hours as needed. 02/13/12  Yes Roderick Pee, MD  lisinopril (PRINIVIL,ZESTRIL) 40 MG tablet TAKE 1 AND  1/2 TABLET BY MOUTH EVERY MORNING 03/14/11  Yes Roderick Pee, MD  Loperamide HCl (IMODIUM PO) Take by mouth as needed.     Yes Historical Provider, MD  metoprolol succinate (TOPROL-XL) 50 MG 24 hr tablet Take 50 mg by mouth 2 (two) times daily. 05/14/11  Yes Roderick Pee, MD  predniSONE (DELTASONE) 20 MG tablet 1 tab x1 week, a half a tab x1 week, then a half a tablet Monday Wednesday Friday for a 3 week taper 03/10/12  Yes Roderick Pee, MD  omeprazole (PRILOSEC) 20 MG capsule Take 1 capsule (20 mg total) by mouth daily. 11/29/10 11/29/11  Meryl Dare, MD,FACG  tadalafil (CIALIS) 20 MG tablet Take 20 mg by mouth  daily as needed.      Historical Provider, MD    Inpatient Medications:    (Not in a hospital admission)  Family History  Problem Relation Age of Onset  . Colon cancer Neg Hx   . Heart disease Father 50    MI- deceased  . Heart disease Brother 97    "Died in his sleep"     History   Social History  . Marital Status: Married    Spouse Name: N/A    Number of Children: 4  . Years of Education: N/A   Occupational History  . Retired     Nurse, children's   Social History Main Topics  . Smoking status: Never Smoker   . Smokeless tobacco: Never Used  . Alcohol Use: No  . Drug Use: No  . Sexually Active: Not on file   Other Topics Concern  . Not on file   Social History Narrative   4 caffeine drinks daily      Review of Systems: General: positive for chills, negative for fever, night sweats or weight changes.  Cardiovascular: positive for DOE, increased abdominal distention, PND, negative for chest pain, edema, orthopnea, palpitations or shortness of breath Dermatological: negative for rash Respiratory: positive negative for cough or wheezing Urologic: negative for current hematuria Abdominal: positive for diarrhea, negative for nausea, vomiting, constipation, bright red blood per rectum, melena, or hematemesis Neurologic:  negative for visual changes, syncope, or dizziness All other systems reviewed and are otherwise negative except as noted above.  Physical Exam: Blood pressure 156/76, pulse 82, temperature 97.8 F (36.6 C), temperature source Oral, resp. rate 20, SpO2 96.00%.    General: Well developed, well nourished, in no acute distress. Head: Normocephalic, atraumatic, sclera non-icteric, no xanthomas, nares are without discharge.  Neck: Negative for carotid bruits. JVD not elevated. Lungs: Clear bilaterally to auscultation without wheezes, rales, or rhonchi. Breathing is unlabored. Heart: RRR with S1 S2. No murmurs, rubs, or gallops appreciated. Abdomen:  Soft, non-tender, non-distended with normoactive bowel sounds. No hepatomegaly. No rebound/guarding. No obvious abdominal masses. Msk:  trength and tone appears normal for age. Extremities: No clubbing, cyanosis or edema.  Distal pedal pulses are 2+ and equal bilaterally. Neuro: Alert and oriented X 3. Moves all extremities spontaneously. Psych:  Responds to questions appropriately with a normal affect.  Labs:  Pending   Radiology/Studies: Dg Chest 2 View  03/14/2012  *RADIOLOGY REPORT*  Clinical Data: Cough for 5 weeks.  CHEST - 2 VIEW  Comparison: PA and lateral chest 04/15/2009 and 06/22/2008.  Findings: The lungs are clear.  Heart size is normal.  No pneumothorax or pleural fluid.  Flowing syndesmophytes throughout the thoracic spine are consistent with ankylosing spondylitis and are stable in appearance.  IMPRESSION: No acute disease.   Original Report Authenticated By: Holley Dexter, M.D.    EKG: atrial fibrillation, 85 bpm, 1 mm ST depression V6, II, III, aVF (unchanged from prior tracings), anterior  Qs or poor R wave progression  ASSESSMENT AND PLAN:   [name] Woods is a 72 y.o. male with PMHx s/f nonobstructive CAD (30% mid LAD lesion on 03/2001 cath), family history of premature CAD, PAF (on ASA), ankylosing spondylitis, HTN, GERD and hiatal hernia presenting to [facility] ED with persistent cough, dyspnea on exertion and progressive fatigue.   1. Progressive fatigue 2. Dyspnea on exertion 3. Productive cough 4. Nonobstructive CAD 5. Paroxysmal atrial fibrillation 6. Ankylosing spondylitis 7. Hypertension 8. GERD  Unclear etiology to patient's symptoms. pBNP one week ago was only mildly elevated. CXR then and today reveal no evidence of CHF. He does report PND, new cough and worsening DOE. Will evaluate further with 2D echo. CHADSVASc score at least 2. He has had hematuria on Coumadin in the past (? Due to nephrolithiasis). No current active bleeding. Will start on  Eliquis to reduce thromboembolic risk. Will hold ACEi for cough. Continue prednisone taper. Cycle cardiac biomarkers. Check TSH and Hgb A1C. Will consider pulmonary/ENT consult. In the future, will plan for stress test. Consider sleep study to r/o OSA.  Signed, R. Hurman Horn, PA-C 03/21/2012, 4:08 PM   History and all data above reviewed.  Patient examined.  I agree with the findings as above.  He presents with cough and SOB.  He has not responded to treatment with antibiotics or steroids.  He did present to [facility] ER and had a mildly elevated pBNP but no edema on CXR. He was treated with Lasix and has lost some weight.  He does describe cough and SOB after lying down.  The cough is productive of a white sputum.  He is in a fib.  This has been PAF.  However, he thinks that he might have been in this most if not all of the time for about one year.  Weights are not increased.  He has no chest pain. The patient exam reveals [id number]  ,  Lungs: No crackles  ,  Abd: Positive bowel sounds, no rebound no guarding, Ext No edema  .  All available labs, radiology testing, previous records reviewed. Agree with documented assessment and plan. Cough - I don't suspect overt heart failure.  However, we will check an echo, enzymes and BNP.  I will continue current diuretic and steroid taper.  Rule out MI though unlikely.  If echo OK he might need an ENT eval to for mucous/choking.  I will stop the ACE as this could be contributing.  For the atrial fib I will start Eliquis.  He had hematuria in the past on warfarin and has kidney stones.  Mr. QUEST TAVENNER has a CHA2DS2 - VASc score of 2with a risk of stroke of 2%.  Of note, if it looks like his afib is persistent I would stop the dronedarone at discharge.  Fatigue will be evaluated with TSH.  He needs to be treated for his known sleep apnea.      Woods, [name]  4:08 PM  03/21/2012

## 2012-03-22 DIAGNOSIS — R0989 Other specified symptoms and signs involving the circulatory and respiratory systems: Secondary | ICD-10-CM | POA: Diagnosis not present

## 2012-03-22 DIAGNOSIS — R0609 Other forms of dyspnea: Secondary | ICD-10-CM | POA: Diagnosis not present

## 2012-03-22 LAB — TROPONIN I: Troponin I: <0.3 ng/mL (ref <0.30)

## 2012-03-22 LAB — HEMOGLOBIN A1C
Hgb A1c MFr Bld: 6 % — ABNORMAL HIGH (ref <5.7)
Mean Plasma Glucose: 126 mg/dL — ABNORMAL HIGH (ref <117)

## 2012-03-22 LAB — CBC
HCT: 47.7 % (ref 39.0–52.0)
MCHC: 34.2 g/dL (ref 30.0–36.0)
RDW: 13.5 % (ref 11.5–15.5)
WBC: 10.2 10*3/uL (ref 4.0–10.5)

## 2012-03-22 LAB — LIPID PANEL
Cholesterol: 125 mg/dL (ref 0–200)
HDL: 32 mg/dL — ABNORMAL LOW (ref >39)
Total CHOL/HDL Ratio: 3.9 RATIO
Triglycerides: 160 mg/dL — ABNORMAL HIGH (ref <150)

## 2012-03-22 LAB — BASIC METABOLIC PANEL
BUN: 29 mg/dL — ABNORMAL HIGH (ref 6–23)
Chloride: 103 mEq/L (ref 96–112)
GFR calc Af Amer: 44 mL/min — ABNORMAL LOW (ref >90)
GFR calc non Af Amer: 38 mL/min — ABNORMAL LOW (ref >90)
Potassium: 4.1 mEq/L (ref 3.5–5.1)
Sodium: 142 mEq/L (ref 135–145)

## 2012-03-22 MED ORDER — ALBUTEROL SULFATE (5 MG/ML) 0.5% IN NEBU
2.5000 mg | INHALATION_SOLUTION | Freq: Four times a day (QID) | RESPIRATORY_TRACT | Status: DC
  Administered 2012-03-22 – 2012-03-24 (×9): 2.5 mg via RESPIRATORY_TRACT
  Filled 2012-03-22 (×9): qty 0.5

## 2012-03-22 MED ORDER — ENOXAPARIN SODIUM 100 MG/ML ~~LOC~~ SOLN
100.0000 mg | Freq: Two times a day (BID) | SUBCUTANEOUS | Status: DC
  Administered 2012-03-22 – 2012-03-23 (×3): 100 mg via SUBCUTANEOUS
  Filled 2012-03-22 (×6): qty 1

## 2012-03-22 MED ORDER — GUAIFENESIN-DM 100-10 MG/5ML PO SYRP: 5.0000 mL | ORAL_SOLUTION | ORAL | Status: DC | PRN

## 2012-03-22 MED ORDER — GUAIFENESIN ER 600 MG PO TB12
600.0000 mg | ORAL_TABLET | Freq: Two times a day (BID) | ORAL | Status: DC
  Administered 2012-03-22 – 2012-03-25 (×7): 600 mg via ORAL
  Filled 2012-03-22 (×8): qty 1

## 2012-03-22 MED ORDER — ALBUTEROL SULFATE (5 MG/ML) 0.5% IN NEBU: 2.5000 mg | INHALATION_SOLUTION | RESPIRATORY_TRACT | Status: DC | PRN

## 2012-03-22 NOTE — Progress Notes (Signed)
ANTICOAGULATION CONSULT NOTE - Initial Consult  Pharmacy Consult for Lovenox Indication: atrial fibrillation  No Known Allergies  Patient Measurements: Height: 6' (182.9 cm) Weight: 212 lb 4.9 oz (96.3 kg) (scale B) IBW/kg (Calculated) : 77.6  Vital Signs: Temp: 97.6 F (36.4 C) (02/22 1020) Temp src: Oral (02/22 1020) BP: 101/65 mmHg (02/22 1026) Pulse Rate: 80 (02/22 1024)  Labs:  Recent Labs  03/21/12 1538 03/21/12 1953 03/22/12 0035 03/22/12 0452 03/22/12 0835  HGB 17.6*  --   --  16.3  --   HCT 50.4  --   --  47.7  --   PLT 205  --   --  192  --   CREATININE 1.63*  --   --  1.73*  --   TROPONINI <0.30 <0.30 <0.30  --  <0.30    Estimated Creatinine Clearance: 47.1 ml/min (by C-G formula based on Cr of 1.73).   Medical History: Past Medical History  Diagnosis Date  . Ventricular hypertrophy   . Paroxysmal atrial fibrillation   . Hiatal hernia   . 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 Cholelithiasis   . Sleep apnea   . Nephrolithiasis   . Arthritis   . Hypertension   . Ankylosing spondylitis   . Atrial fibrillation   . GERD (gastroesophageal reflux disease)   . CAD (coronary artery disease) 2003    30% mid LAD lesion on cardiac cath    Medications:  Scheduled:  . [COMPLETED] albuterol  10 mg Nebulization Once  . albuterol  2.5 mg Nebulization Q6H  . diltiazem  180 mg Oral Daily  . furosemide  20 mg Oral Daily  . guaiFENesin  600 mg Oral BID  . [COMPLETED] ipratropium  1 mg Nebulization Once  . metoprolol succinate  50 mg Oral BID  . pantoprazole  40 mg Oral Daily  . predniSONE  10 mg Oral Q breakfast  . [START ON 03/24/2012] predniSONE  10 mg Oral Q M,W,F  . sodium chloride  3 mL Intravenous Q12H  . [DISCONTINUED] apixaban  5 mg Oral BID  . [DISCONTINUED] dronedarone  400 mg Oral BID WC    Assessment: 72 y/o M on apixaban who is being switched to lovenox short-term in anticipation of a cardiac cath early this week. CBC stable. Scr 1.73 with  estimated CrCl ~ 45-50 mL/min. No evidence of bleeding. Last dose of apixaban was this morning at 1023.   Goal of Therapy: Monitor platelets by anticoagulation protocol: Yes   Plan:  - Lovenox 100 mg [place] q12h to start tonight at 2000 - CBC q72h minimum while on Lovenox - Monitor for bleeding - f/u CARDS plans to switch back to Apixaban  Abran Duke, PharmD Clinical Pharmacist Phone: [phone] Pager: [phone] 03/22/2012 1:44 PM

## 2012-03-22 NOTE — Progress Notes (Addendum)
Subjective:  72 year old male with several week/month history of worsening sputum production that is causing him the sensation of not being able to breathe especially at night with BNP of 987, possibly persistent atrial fibrillation, chronic kidney disease.  Overnight, had another bad night with mucus production. He has been on a steroid taper. He has been diuresed and his creatinine has elevated from 1.63-1.73. Echocardiogram currently pending. Troponins have been normal.  Objective:  Vital Signs in the last 24 hours: Temp:  [97.3 F (36.3 C)-97.9 F (36.6 C)] 97.3 F (36.3 C) (02/22 0513) Pulse Rate:  [74-82] 80 (02/22 1024) Resp:  [18-20] 18 (02/22 0513) BP: (101-156)/(59-77) 101/65 mmHg (02/22 1026) SpO2:  [93 %-97 %] 93 % (02/22 0513) FiO2 (%):  [28 %] 28 % (02/21 1906) Weight:  [95.9 kg (211 lb 6.7 oz)-96.3 kg (212 lb 4.9 oz)] 96.3 kg (212 lb 4.9 oz) (02/22 0513)  Intake/Output from previous day: 02/21 0701 - 02/22 0700 In: 480 [P.O.:480] Out: 250 [Urine:250]   Physical Exam: General: Well developed, well nourished, in no acute distress. Head:  Normocephalic and atraumatic. Lungs: Mild wheeze heard bilaterally, normal appearing respiratory effort, no cough currently Heart: Irregularly irregular, normal rate.  No murmur, rubs or gallops.  Abdomen: soft, non-tender, positive bowel sounds. Obese Extremities: No clubbing or cyanosis. Trace edema. Neurologic: Alert and oriented x 3.    Lab Results:  Recent Labs  03/21/12 1538 03/22/12 0452  WBC 13.2* 10.2  HGB 17.6* 16.3  PLT 205 192    Recent Labs  03/21/12 1538 03/22/12 0452  NA 140 142  K 4.1 4.1  CL 102 103  CO2 27 31  GLUCOSE 90 87  BUN 29* 29*  CREATININE 1.63* 1.73*    Recent Labs  03/22/12 0035 03/22/12 0835  TROPONINI <0.30 <0.30   He  Recent Labs  03/22/12 0452  CHOL 125   No results found for this basename: PROTIME,  in the last 72 hours  Imaging: Dg Chest 2 View  03/21/2012   *RADIOLOGY REPORT*  Clinical Data: Chest pain  CHEST - 2 VIEW  Comparison: 03/14/2012  Findings: Heart is upper limits normal in size.  No focal airspace opacities or effusions.  No acute bony abnormality.  IMPRESSION: No acute cardiopulmonary disease or change.   Original Report Authenticated By: Charlett Nose, M.D.    Personally viewed.   Telemetry: Atrial fibrillation, rate controlled, less than 100 Personally viewed.   EKG:  Atrial fibrillation, 62, nonspecific ST-T changes.  Cardiac Studies:  Echocardiogram just performed this morning, report pending.  Assessment/Plan:  Principal Problem:   Dyspnea on exertion Active Problems:   HYPERTENSION   Ankylosing spondylitis   Cough   Paroxysmal atrial fibrillation   CAD (coronary artery disease)   GERD (gastroesophageal reflux disease)   Fatigue   1. Paroxysmal atrial fibrillation-currently persistent. This may have been present over the past 11 months or so. He is on dronedarone and I agree that if we are going to continue with a rate control strategy that we discontinue this medication she does not illustrate sinus rhythm before discharge. He may require an increase in diltiazem or metoprolol if this medication is discontinued for overall rate control. His family states that last year there was intention to perform another cardioversion however he did not followup with this. Troponin is negative. No evidence of myocardial infarction.  2. Chronic anticoagulation- apixaban 5 mg twice a day.  3. Excessive sputum production-sensation of choking, shortness of breath with long-standing excessive  sputum production. Currently being placed on prednisone taper. Diuresis has been attempted and he is currently on low-dose Lasix 20 mg a day. His BNP was slightly elevated. Chest x-ray was not impressive for heart failure. I will consult pulmonary for their insight on his acute on chronic condition.   4. Coronary artery disease-currently stable without  any anginal symptoms. Nonflow limiting disease on last catheterization in 2003. Stress test in 2007 with no evidence of ischemia.  5. Chronic kidney disease-worsening creatinine. This is likely a result of recent diuresis. Continue to monitor. We may need to discontinue his low-dose Lasix. Avoiding NSAIDs. Off ACE inhibitor now.  Janeene Sand 03/22/2012, 10:45 AM    ADDENDUM:  I have reviewed echocardiogram-ejection fraction in the 40% to 45% range. This is reduced from previously described ejection fraction of 65% during catheterization in 2003. Nuclear stress in 2007 was unable to gait images due to atrial fibrillation.  With reduction of ejection fraction in the 40% range and likelihood of persistent atrial fibrillation as was noted in 2007, I will discontinue dronederone and watch for increasing heart rates. Goal will be to utilize beta blocker, currently on metoprolol succinate 50 mg twice a day, in increased doses if necessary. It may also be helpful to discontinue diltiazem given its negative inotropic qualities.   We will continue with low-dose Lasix. We may need to hold his Lasix based upon his creatinine tomorrow. Hopefully add hydralazine/nitrate as tolerated.  I will also hold apixaban in anticipation of possible cardiac catheterization on Monday (decreased EF). His current creatinine of 1.73 may preclude this however. I will place him on an Lovenox per pharmacy.

## 2012-03-22 NOTE — Progress Notes (Signed)
  Echocardiogram 2D Echocardiogram has been performed.  [name] Woods [name] 03/22/2012, 11:00 AM

## 2012-03-22 NOTE — Consult Note (Signed)
PULMONARY  / CRITICAL CARE MEDICINE  Name: [name] Woods MRN: [id number] DOB: March 10, 1940    ADMISSION DATE:  03/21/2012 CONSULTATION DATE:  03/22/12  REFERRING MD : Chales Abrahams  CHIEF COMPLAINT:  Dyspnea with productive cough  BRIEF PATIENT DESCRIPTION: [name] Woods is a 72 y.o. male with PMHx s/f nonobstructive CAD (30% mid LAD lesion on 03/2001 cath), family history of premature CAD, PAF (on ASA), ankylosing spondylitis, HTN, GERD and hiatal hernia presenting to [facility] ED with persistent cough, dyspnea on exertion and progressive fatigue.  New onset of productive cough with clear mucus x 6 weeks.   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 Admitted by cardiology with increased dyspnea and cough. Noted to have increased BNP. He and family date distinct increase/ new cough x 6 weeks. He may have had a non-febrile cold near the onset, but no purulent sputum. He did cut holes in a new polyurethane sealed floor w/o respiratory protection at that time. Cough began dry but progressed to productive of thick clear, choking mucus. Worse at night- wakes him 2-3x. Tried wife's asthma inhaler once- helped. ACE inhibitor has been stopped on admission. Pulmonary is asked to see for help with the cough. He reports dx of obstructive sleep apnea by [facility]?) but never treated. Family confirms loud snore.  PAST MEDICAL HISTORY :  Past Medical History  Diagnosis Date  . Ventricular hypertrophy   . Paroxysmal atrial fibrillation   . Hiatal hernia   . 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 Cholelithiasis   . Sleep apnea   . Nephrolithiasis   . Arthritis   . Hypertension   . Ankylosing spondylitis   . Atrial fibrillation   . GERD (gastroesophageal reflux disease)   . CAD (coronary artery disease) 2003    30% mid LAD lesion  on cardiac cath   Past Surgical History  Procedure Laterality Date  . Cholecystectomy    . Cardiac catheterization  03/2001    30% mid LAD lesion otherwise normal cors, LVEF 65%  . Tonsillectomy     Prior to Admission medications   Medication Sig Start Date End Date Taking? Authorizing Provider  aspirin 325 MG tablet Take 325 mg by mouth daily.   Yes Historical Provider, MD  diltiazem (CARTIA XT) 180 MG 24 hr capsule Take 180 mg by mouth daily.   Yes Historical Provider, MD  dronedarone (MULTAQ) 400 MG tablet Take 400 mg by mouth 2 (two) times daily with a meal.     Yes Historical Provider, MD  furosemide (LASIX) 20 MG tablet Take 1 tablet (20 mg total) by mouth daily. 03/14/12  Yes Geoffery Lyons, MD  HYDROcodone-acetaminophen (NORCO) 7.5-325 MG per tablet Take 1 tablet by mouth every 8 (eight) hours as needed. 02/13/12  Yes Roderick Pee, MD  lisinopril (PRINIVIL,ZESTRIL) 40 MG tablet TAKE 1 AND  1/2 TABLET BY MOUTH EVERY MORNING 03/14/11  Yes Roderick Pee, MD  Loperamide HCl (IMODIUM PO) Take by mouth as needed.     Yes Historical Provider, MD  metoprolol succinate (TOPROL-XL) 50 MG 24 hr tablet Take 50 mg by mouth 2 (two) times daily. 05/14/11  Yes Roderick Pee, MD  predniSONE (DELTASONE) 20 MG tablet 1 tab x1 week, a half a tab x1 week, then a half a tablet Monday  Wednesday Friday for a 3 week taper 03/10/12  Yes Roderick Pee, MD  omeprazole (PRILOSEC) 20 MG capsule Take 1 capsule (20 mg total) by mouth daily. 11/29/10 11/29/11  Meryl Dare, MD,FACG  tadalafil (CIALIS) 20 MG tablet Take 20 mg by mouth daily as needed.      Historical Provider, MD   No Known Allergies  FAMILY HISTORY:  Family History  Problem Relation Age of Onset  . Colon cancer Neg Hx   . Heart disease Father 56    MI- deceased  . Heart disease Brother 68    "Died in his sleep"   SOCIAL HISTORY:  reports that he has never smoked. He has never used smokeless tobacco. He reports that he does not drink  alcohol or use illicit drugs.  REVIEW OF SYSTEMS:   Constitutional: Negative for fever,, weight loss, malaise/fatigue and diaphoresis.  HENT: Negative for hearing loss, ear pain, nosebleeds, congestion, sore throat, neck pain, tinnitus and ear discharge.   Eyes: Negative for blurred vision, double vision, photophobia, pain, discharge and redness.  Respiratory: Positive for cough,  sputum production, shortness of breath, wheezing. No stridor or heme. Cardiovascular: Negative for chest pain, palpitations, orthopnea, claudication, leg swelling and PND.  Gastrointestinal: Negative for heartburn, nausea, vomiting, abdominal pain, diarrhea, constipation, blood in stool and melena.  Genitourinary: Negative for dysuria, urgency, frequency, hematuria and flank pain.  Musculoskeletal: Negative for myalgias and falls.  Skin: Negative for itching and rash.  Neurological: Negative for dizziness, tingling, tremors, sensory change, speech change, focal weakness, seizures, loss of consciousness, weakness and headaches.  Endo/Heme/Allergies: Negative for environmental allergies and polydipsia. Does not bruise/bleed easily.  SUBJECTIVE:   VITAL SIGNS: Temp:  [97.3 F (36.3 C)-97.9 F (36.6 C)] 97.6 F (36.4 C) (02/22 1020) Pulse Rate:  [74-82] 80 (02/22 1024) Resp:  [18-20] 19 (02/22 1020) BP: (101-156)/(59-77) 101/65 mmHg (02/22 1026) SpO2:  [93 %-97 %] 96 % (02/22 1020) FiO2 (%):  [28 %] 28 % (02/21 1906) Weight:  [95.9 kg (211 lb 6.7 oz)-96.3 kg (212 lb 4.9 oz)] 96.3 kg (212 lb 4.9 oz) (02/22 0513)  PHYSICAL EXAMINATION: General:  Alert, pleasant. Extensive family in room Neuro:  Non-focal. Oriented. Moves all extrem. EOMs and speech seem intact HEENT:  Mucosa normal, Nose clear, conjunctivae clear Neck:  No JVD nor stridor Cardiovascular:  RRR, no murmur Lungs:  Coarse breath sounds, unlabored. Sitting triggered raspy cough, no wheeze, no dullness or rub Abdomen:  Soft, non-tender, no  HSM Musculoskeletal:  Normal muscle bulk Skin:  No rash   Recent Labs Lab 03/21/12 1538 03/22/12 0452  NA 140 142  K 4.1 4.1  CL 102 103  CO2 27 31  BUN 29* 29*  CREATININE 1.63* 1.73*  GLUCOSE 90 87    Recent Labs Lab 03/21/12 1538 03/22/12 0452  HGB 17.6* 16.3  HCT 50.4 47.7  WBC 13.2* 10.2  PLT 205 192   Dg Chest 2 View  03/21/2012  *RADIOLOGY REPORT*  Clinical Data: Chest pain  CHEST - 2 VIEW  Comparison: 03/14/2012  Findings: Heart is upper limits normal in size.  No focal airspace opacities or effusions.  No acute bony abnormality.  IMPRESSION: No acute cardiopulmonary disease or change.   Original Report Authenticated By: Charlett Nose, M.D.   I reviewed images Medications reviewed  ASSESSMENT / PLAN: 1) Asthma with bronchitis- acute exacerbation. Suspect airway inflammation by inhalation of polyurethane dust/ toluene diisocyanate/ TDI and perhaps also a viral bronchiolitis as triggers. Simple CHF  would not commonly produce a thick glue-like mucus by itself. Inspiratory "Mueller maneuver" related to untreated sleep apnea, and increased pulmonary vascular pressure from heart may factor in.   Plan- He noted response to bronchodilator inhaler. Hx of adverse mood change with systemic steroids. I will add nebs, steroid inhaler, mucinex and a Flutter for pulmonary toilet.  Pulmonary and Critical Care Medicine [facility] Pager: [phone]  03/22/2012, 12:40 PM

## 2012-03-23 DIAGNOSIS — R0609 Other forms of dyspnea: Secondary | ICD-10-CM | POA: Diagnosis not present

## 2012-03-23 DIAGNOSIS — R0989 Other specified symptoms and signs involving the circulatory and respiratory systems: Secondary | ICD-10-CM | POA: Diagnosis not present

## 2012-03-23 DIAGNOSIS — R05 Cough: Secondary | ICD-10-CM | POA: Diagnosis not present

## 2012-03-23 LAB — BASIC METABOLIC PANEL
BUN: 29 mg/dL — ABNORMAL HIGH (ref 6–23)
Chloride: 100 mEq/L (ref 96–112)
GFR calc Af Amer: 58 mL/min — ABNORMAL LOW (ref >90)
GFR calc non Af Amer: 50 mL/min — ABNORMAL LOW (ref >90)
Potassium: 4.1 mEq/L (ref 3.5–5.1)
Sodium: 139 mEq/L (ref 135–145)

## 2012-03-23 MED ORDER — METOPROLOL SUCCINATE ER 100 MG PO TB24
100.0000 mg | ORAL_TABLET | Freq: Two times a day (BID) | ORAL | Status: DC
  Administered 2012-03-23 – 2012-03-25 (×4): 100 mg via ORAL
  Filled 2012-03-23 (×5): qty 1

## 2012-03-23 MED ORDER — METOPROLOL TARTRATE 50 MG PO TABS
50.0000 mg | ORAL_TABLET | Freq: Once | ORAL | Status: AC
  Administered 2012-03-23: 50 mg via ORAL
  Filled 2012-03-23: qty 1

## 2012-03-23 NOTE — Progress Notes (Signed)
Subjective:  Feels a little bit better. He feels as though his mucus is being broken up better than it was a few days ago. Appreciate pulmonary consultation. No chest pain. No significant shortness of breath.  Objective:  Vital Signs in the last 24 hours: Temp:  [97.8 F (36.6 C)-98.4 F (36.9 C)] 98.3 F (36.8 C) (02/23 0605) Pulse Rate:  [73-93] 73 (02/23 0605) Resp:  [18-20] 18 (02/23 0605) BP: (107-144)/(56-83) 144/83 mmHg (02/23 0605) SpO2:  [95 %-98 %] 97 % (02/23 0806) Weight:  [96.3 kg (212 lb 4.9 oz)] 96.3 kg (212 lb 4.9 oz) (02/23 0605)  Intake/Output from previous day: 02/22 0701 - 02/23 0700 In: 703 [P.O.:700; I.V.:3] Out: 1400 [Urine:1400]   Physical Exam: General: Well developed, well nourished, in no acute distress.  Head: Normocephalic and atraumatic.  Lungs: Mild wheeze heard bilaterally, normal appearing respiratory effort, no cough currently  Heart: Irregularly irregular, normal rate. No murmur, rubs or gallops.  Abdomen: soft, non-tender, positive bowel sounds. Obese  Extremities: No clubbing or cyanosis. Trace edema.  Neurologic: Alert and oriented x 3.    Lab Results:  Recent Labs  03/21/12 1538 03/22/12 0452  WBC 13.2* 10.2  HGB 17.6* 16.3  PLT 205 192    Recent Labs  03/21/12 1538 03/22/12 0452  NA 140 142  K 4.1 4.1  CL 102 103  CO2 27 31  GLUCOSE 90 87  BUN 29* 29*  CREATININE 1.63* 1.73*    Recent Labs  03/22/12 0035 03/22/12 0835  TROPONINI <0.30 <0.30   Imaging: Dg Chest 2 View  03/21/2012  *RADIOLOGY REPORT*  Clinical Data: Chest pain  CHEST - 2 VIEW  Comparison: 03/14/2012  Findings: Heart is upper limits normal in size.  No focal airspace opacities or effusions.  No acute bony abnormality.  IMPRESSION: No acute cardiopulmonary disease or change.   Original Report Authenticated By: Charlett Nose, M.D.    Personally viewed.   Telemetry: Atrial fibrillation, rate controlled Personally viewed.   Cardiac Studies:  EF  40-45%. Decreased from prior.  Assessment/Plan:  Principal Problem:   Dyspnea on exertion Active Problems:   HYPERTENSION   Ankylosing spondylitis   Cough   Paroxysmal atrial fibrillation   CAD (coronary artery disease)   GERD (gastroesophageal reflux disease)   Fatigue  72 year old male with several month history of increased mucus production, worsening shortness of breath, persistent atrial fibrillation for now at least one year with documented atrial fibrillation back several years ago on stress test, chronic anticoagulation, chronic kidney disease/acute kidney injury with newly discovered cardiomyopathy, ejection fraction in the 40% range.  1. Cardiomyopathy-I will make him n.p.o. past midnight in case cardiac catheterization is requested. It would not be unreasonable to assess his coronary anatomy given the decrease in his overall ejection fraction. However, creatinine has increased, last check 1.73. Repeating today and tomorrow. Currently on very low dose Lasix of 20 mg once a day. We may need to optimize his renal function prior to cardiac catheterization. He would not be unreasonable to pursue this as an outpatient as well.  2. Atrial fibrillation-likely persistent over the past several months. He was supposed to have cardioversion at one point but did not show for followup appointment. I have discontinued his dronedarone yesterday. I will increase his metoprolol from 50 mg twice a day to 100 mg twice a day. I do believe that his blood pressure/heart rate will be up to tolerate this. I will discontinue his diltiazem because of the increasing metoprolol.  I would rather him be on a beta blocker and calcium channel blocker with decreased ejection fraction. He is not on an ACE inhibitor because of his acute kidney injury.  3. Chronic anticoagulation-he was started on apixiban during this hospitalization. I have discontinued this and placed him on enoxaparin full dose in case cardiac  catheterization takes place. If it is decided that he will undergo cardiac catheterization as an outpatient, he will be placed back on apixiban.  4. Coronary artery disease-no anginal symptoms currently. Stress test 2007 no evidence of ischemia. Prior cardiac catheterization in 2003 showed no flow-limiting CAD.  5. Chronic kidney disease/acute kidney injury-recheck in creatinine this morning. Increased. Likely a result of diuresis. Decreased ejection fraction. No ACE inhibitor. If blood pressure is able to tolerate, consider addition of hydralazine/isosorbide.   SKAINS, MARK 03/23/2012, 10:45 AM

## 2012-03-23 NOTE — Progress Notes (Signed)
Pt having episodes of extreme tachy, pt asymptomatic, Dr. Anne Fu notified, one time metoprolol to be given, will continue to monitor

## 2012-03-23 NOTE — Plan of Care (Signed)
Problem: Phase I Progression Outcomes Goal: Anginal pain relieved Outcome: Completed/Met Date Met:  03/23/12 Pt has not had any c/o anginal pain, goal met Goal: Voiding-avoid urinary catheter unless indicated Outcome: Completed/Met Date Met:  03/23/12 Pt voiding in urinal, voids adequate amounts of urine, foley not indicated

## 2012-03-23 NOTE — Consult Note (Signed)
PULMONARY  / CRITICAL CARE MEDICINE  Name: [name] Woods MRN: [id number] DOB: 03-07-1940    ADMISSION DATE:  03/21/2012 CONSULTATION DATE:  03/22/12  REFERRING MD : Chales Abrahams  CHIEF COMPLAINT:  Dyspnea with productive cough  BRIEF PATIENT DESCRIPTION: [name] Woods is a 72 y.o. male with PMHx s/f nonobstructive CAD (30% mid LAD lesion on 03/2001 cath), family history of premature CAD, PAF (on ASA), ankylosing spondylitis, HTN, GERD and hiatal hernia presenting to [facility] ED with persistent cough, dyspnea on exertion and progressive fatigue.  New onset of productive cough with clear mucus x 6 weeks.   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 Admitted by cardiology with increased dyspnea and cough. Noted to have increased BNP. He and family date distinct increase/ new cough x 6 weeks. He may have had a non-febrile cold near the onset, but no purulent sputum. He did cut holes in a new polyurethane sealed floor w/o respiratory protection at that time. Cough began dry but progressed to productive of thick clear, choking mucus. Worse at night- wakes him 2-3x. Tried wife's asthma inhaler once- helped. ACE inhibitor has been stopped on admission. Pulmonary is asked to see for help with the cough. He reports dx of obstructive sleep apnea by [facility] but never treated. Family confirms loud snore.   SUBJECTIVE: Slept better. Can feel neb "starting to work". Using Flutter  VITAL SIGNS: Temp:  [97.6 F (36.4 C)-98.4 F (36.9 C)] 98.3 F (36.8 C) (02/23 0605) Pulse Rate:  [73-93] 73 (02/23 0605) Resp:  [18-20] 18 (02/23 0605) BP: (101-144)/(56-83) 144/83 mmHg (02/23 0605) SpO2:  [95 %-98 %] 97 % (02/23 0806) Weight:  [212 lb 4.9 oz (96.3 kg)] 212 lb 4.9 oz (96.3 kg) (02/23 4098)  PHYSICAL EXAMINATION: General:   Alert, pleasant.  Neuro:  Non-focal. Oriented. Moves all extrem. EOMs and speech seem intact HEENT:  Mucosa normal, Nose clear, conjunctivae clear Neck:  No JVD nor stridor Cardiovascular:  RRR, no murmur Lungs:  Distant breath sounds. Somewhat better airflow. No wheeze/ rhonchi or rales Abdomen:  Soft, non-tender, no HSM Musculoskeletal:  Normal muscle bulk Skin:  No rash   Recent Labs Lab 03/21/12 1538 03/22/12 0452  NA 140 142  K 4.1 4.1  CL 102 103  CO2 27 31  BUN 29* 29*  CREATININE 1.63* 1.73*  GLUCOSE 90 87    Recent Labs Lab 03/21/12 1538 03/22/12 0452  HGB 17.6* 16.3  HCT 50.4 47.7  WBC 13.2* 10.2  PLT 205 192   Dg Chest 2 View  03/21/2012  *RADIOLOGY REPORT*  Clinical Data: Chest pain  CHEST - 2 VIEW  Comparison: 03/14/2012  Findings: Heart is upper limits normal in size.  No focal airspace opacities or effusions.  No acute bony abnormality.  IMPRESSION: No acute cardiopulmonary disease or change.   Original Report Authenticated By: Charlett Nose, M.D.    Medications reviewed  ASSESSMENT / PLAN: 1) Asthma with bronchitis- acute exacerbation. Suspect airway inflammation by inhalation of polyurethane dust/ toluene diisocyanate/ TDI and perhaps also a viral bronchiolitis as triggers. Simple CHF would not commonly produce a thick glue-like mucus by itself. Inspiratory "Mueller maneuver" related to untreated sleep apnea, and increased pulmonary vascular pressure from heart may factor in.   Plan- He noted response to bronchodilator inhaler. Hx of  adverse mood change with systemic steroids. I will add nebs,mucinex and a Flutter for pulmonary toilet. - 2/23- 1 more day of nebs, then change to a LABA/ ICS -He should have Pulm OV f/u   Pulmonary and Critical Care Medicine [facility] Pager: [phone]  03/23/2012, 9:41 AM

## 2012-03-24 ENCOUNTER — Encounter (HOSPITAL_COMMUNITY)
Admission: EM | Disposition: A | Discharge status: Home / Self Care | Payer: Self-pay | Source: Home / Self Care | Attending: Emergency Medicine

## 2012-03-24 DIAGNOSIS — I509 Heart failure, unspecified: Secondary | ICD-10-CM

## 2012-03-24 DIAGNOSIS — I2510 Atherosclerotic heart disease of native coronary artery without angina pectoris: Secondary | ICD-10-CM | POA: Diagnosis not present

## 2012-03-24 HISTORY — PX: LEFT AND RIGHT HEART CATHETERIZATION WITH CORONARY ANGIOGRAM: SHX5449

## 2012-03-24 LAB — PROTIME-INR
INR: 1.08 (ref 0.00–1.49)
Prothrombin Time: 13.9 seconds (ref 11.6–15.2)

## 2012-03-24 LAB — BASIC METABOLIC PANEL
Chloride: 101 mEq/L (ref 96–112)
Creatinine, Ser: 1.34 mg/dL (ref 0.50–1.35)
GFR calc Af Amer: 60 mL/min — ABNORMAL LOW (ref >90)
Potassium: 3.6 mEq/L (ref 3.5–5.1)
Sodium: 141 mEq/L (ref 135–145)

## 2012-03-24 LAB — POCT I-STAT 3, ART BLOOD GAS (G3+)
Acid-Base Excess: 3 mmol/L — ABNORMAL HIGH (ref 0.0–2.0)
Bicarbonate: 26.8 mEq/L — ABNORMAL HIGH (ref 20.0–24.0)
pH, Arterial: 7.447 (ref 7.350–7.450)

## 2012-03-24 LAB — POCT I-STAT 3, VENOUS BLOOD GAS (G3P V)
Acid-Base Excess: 2 mmol/L (ref 0.0–2.0)
Bicarbonate: 26.3 mEq/L — ABNORMAL HIGH (ref 20.0–24.0)
pH, Ven: 7.432 — ABNORMAL HIGH (ref 7.250–7.300)
pO2, Ven: 32 mmHg (ref 30.0–45.0)

## 2012-03-24 SURGERY — LEFT AND RIGHT HEART CATHETERIZATION WITH CORONARY ANGIOGRAM
Anesthesia: LOCAL

## 2012-03-24 MED ORDER — GUAIFENESIN-DM 100-10 MG/5ML PO SYRP: 15.0000 mL | ORAL_SOLUTION | ORAL | Status: DC | PRN

## 2012-03-24 MED ORDER — APIXABAN 5 MG PO TABS
5.0000 mg | ORAL_TABLET | Freq: Two times a day (BID) | ORAL | Status: DC
  Administered 2012-03-25: 5 mg via ORAL
  Filled 2012-03-24 (×2): qty 1

## 2012-03-24 MED ORDER — MIDAZOLAM HCL 2 MG/2ML IJ SOLN
INTRAMUSCULAR | Status: AC
  Filled 2012-03-24: qty 2

## 2012-03-24 MED ORDER — ASPIRIN 81 MG PO CHEW
324.0000 mg | CHEWABLE_TABLET | ORAL | Status: AC
  Administered 2012-03-24: 324 mg via ORAL
  Filled 2012-03-24: qty 4

## 2012-03-24 MED ORDER — PRAMOXINE-ZINC OXIDE IN MO 1-12.5 % RE OINT: 1.0000 "application " | TOPICAL_OINTMENT | Freq: Three times a day (TID) | RECTAL | Status: DC | PRN

## 2012-03-24 MED ORDER — METOPROLOL TARTRATE 1 MG/ML IV SOLN
INTRAVENOUS | Status: AC
  Filled 2012-03-24: qty 5

## 2012-03-24 MED ORDER — LOPERAMIDE HCL 2 MG PO CAPS: 2.0000 mg | ORAL_CAPSULE | ORAL | Status: DC | PRN

## 2012-03-24 MED ORDER — MAGNESIUM HYDROXIDE 400 MG/5ML PO SUSP: 30.0000 mL | Freq: Every day | ORAL | Status: DC | PRN

## 2012-03-24 MED ORDER — SODIUM CHLORIDE 0.9 % IV SOLN: INTRAVENOUS | Status: AC

## 2012-03-24 MED ORDER — FENTANYL CITRATE 0.05 MG/ML IJ SOLN
INTRAMUSCULAR | Status: AC
  Filled 2012-03-24: qty 2

## 2012-03-24 MED ORDER — ALUM & MAG HYDROXIDE-SIMETH 200-200-20 MG/5ML PO SUSP: 30.0000 mL | ORAL | Status: DC | PRN

## 2012-03-24 MED ORDER — SODIUM CHLORIDE 0.9 % IV SOLN: 250.0000 mL | INTRAVENOUS | Status: DC | PRN

## 2012-03-24 MED ORDER — SODIUM CHLORIDE 0.9 % IJ SOLN
3.0000 mL | Freq: Two times a day (BID) | INTRAMUSCULAR | Status: DC
  Administered 2012-03-24: 3 mL via INTRAVENOUS

## 2012-03-24 MED ORDER — LIDOCAINE HCL (PF) 1 % IJ SOLN
INTRAMUSCULAR | Status: AC
  Filled 2012-03-24: qty 30

## 2012-03-24 MED ORDER — SODIUM CHLORIDE 0.9 % IJ SOLN: 3.0000 mL | INTRAMUSCULAR | Status: DC | PRN

## 2012-03-24 MED ORDER — SODIUM CHLORIDE 0.9 % IV SOLN
INTRAVENOUS | Status: DC
  Administered 2012-03-24: 10:00:00 via INTRAVENOUS

## 2012-03-24 MED ORDER — MOMETASONE FURO-FORMOTEROL FUM 200-5 MCG/ACT IN AERO
2.0000 | INHALATION_SPRAY | Freq: Two times a day (BID) | RESPIRATORY_TRACT | Status: DC
  Administered 2012-03-24 – 2012-03-25 (×2): 2 via RESPIRATORY_TRACT
  Filled 2012-03-24: qty 8.8

## 2012-03-24 MED ORDER — HEPARIN (PORCINE) IN NACL 2-0.9 UNIT/ML-% IJ SOLN
INTRAMUSCULAR | Status: AC
  Filled 2012-03-24: qty 1000

## 2012-03-24 NOTE — Interval H&P Note (Signed)
History and Physical Interval Note:  03/24/2012 2:34 PM  [name] Woods  has presented today for surgery, with the diagnosis of cp  The various methods of treatment have been discussed with the patient and family. After consideration of risks, benefits and other options for treatment, the patient has consented to  Procedure(s): LEFT AND RIGHT HEART CATHETERIZATION WITH CORONARY ANGIOGRAM (N/A) as a surgical intervention .  The patient's history has been reviewed, patient examined, no change in status, stable for surgery.  I have reviewed the patient's chart and labs.  Questions were answered to the patient's satisfaction.     Tonny Bollman

## 2012-03-24 NOTE — H&P (View-Only) (Signed)
   SUBJECTIVE:  Still with cough but improved.  No pain   PHYSICAL EXAM Filed Vitals:   03/23/12 1349 03/23/12 1427 03/23/12 2031 03/24/12 0526  BP: 117/68  111/71 109/76  Pulse: 89  82 84  Temp: 97.9 F (36.6 C)  98 F (36.7 C) 98 F (36.7 C)  TempSrc: Oral  Oral Oral  Resp: 19  20 20  Height:      Weight:    211 lb 10.3 oz (96 kg)  SpO2: 97% 97% 98% 97%   General:  No distress Lungs:  Clear Heart:  Irregular Abdomen:  Positive bowel sounds, no rebound no guarding Extremities:  No edema  LABS: Lab Results  Component Value Date   TROPONINI <0.30 03/22/2012   Results for orders placed during the hospital encounter of 03/21/12 (from the past 24 hour(s))  BASIC METABOLIC PANEL     Status: Abnormal   Collection Time    03/23/12 11:10 AM      Result Value Range   Sodium 139  135 - 145 mEq/L   Potassium 4.1  3.5 - 5.1 mEq/L   Chloride 100  96 - 112 mEq/L   CO2 30  19 - 32 mEq/L   Glucose, Bld 87  70 - 99 mg/dL   BUN 29 (*) 6 - 23 mg/dL   Creatinine, Ser 1.38 (*) 0.50 - 1.35 mg/dL   Calcium 9.2  8.4 - 10.5 mg/dL   GFR calc non Af Amer 50 (*) >90 mL/min   GFR calc Af Amer 58 (*) >90 mL/min  BASIC METABOLIC PANEL     Status: Abnormal   Collection Time    03/24/12  5:17 AM      Result Value Range   Sodium 141  135 - 145 mEq/L   Potassium 3.6  3.5 - 5.1 mEq/L   Chloride 101  96 - 112 mEq/L   CO2 29  19 - 32 mEq/L   Glucose, Bld 81  70 - 99 mg/dL   BUN 26 (*) 6 - 23 mg/dL   Creatinine, Ser 1.34  0.50 - 1.35 mg/dL   Calcium 9.0  8.4 - 10.5 mg/dL   GFR calc non Af Amer 52 (*) >90 mL/min   GFR calc Af Amer 60 (*) >90 mL/min    Intake/Output Summary (Last 24 hours) at 03/24/12 0735 Last data filed at 03/24/12 0718  Gross per 24 hour  Intake   1340 ml  Output   1475 ml  Net   -135 ml    ASSESSMENT AND PLAN:  Cardiomyopathy-  Right and left heart cath today given new decrease in EF. Last dose of apixaban 2/22 at 10 AM.   Atrial fibrillation- Dronedarone  discontinued.  Metoprolol increased.  Resume apixaban after cath.    Chronic kidney disease - Creat is improved.    Cough  -  Being managed for asthma and bronchitis.  I appreciate Dr. Young's help.   [name] Woods 03/24/2012 7:35 AM   

## 2012-03-24 NOTE — Progress Notes (Signed)
PULMONARY  / CRITICAL CARE MEDICINE  Name: [name] Woods MRN: [id number] DOB: 1940-08-08    ADMISSION DATE:  03/21/2012 CONSULTATION DATE:  03/22/12  REFERRING MD : Chales Abrahams  CHIEF COMPLAINT:  Dyspnea with productive cough  BRIEF PATIENT DESCRIPTION: [name] Woods is a 72 y.o. male with PMHx s/f nonobstructive CAD (30% mid LAD lesion on 03/2001 cath), family history of premature CAD, PAF (on ASA), ankylosing spondylitis, HTN, GERD and hiatal hernia presenting to [facility] ED with persistent cough, dyspnea on exertion and progressive fatigue.  New onset of productive cough with clear mucus x 6 weeks.    SUBJECTIVE: cough is better.  On BD neb meds, Had cath, results noted   VITAL SIGNS: Temp:  [98 F (36.7 C)] 98 F (36.7 C) (02/24 0526) Pulse Rate:  [76-87] 76 (02/24 1410) Resp:  [18-20] 18 (02/24 1355) BP: (109-111)/(71-76) 109/76 mmHg (02/24 0526) SpO2:  [94 %-98 %] 94 % (02/24 1355) Weight:  [96 kg (211 lb 10.3 oz)] 96 kg (211 lb 10.3 oz) (02/24 0526)  PHYSICAL EXAMINATION: General:  Alert, pleasant.  Neuro:  Non-focal. Oriented. Moves all extrem. EOMs and speech seem intact HEENT:  Mucosa normal, Nose clear, conjunctivae clear Neck:  No JVD nor stridor Cardiovascular:  RRR, no murmur Lungs:  Distant breath sounds. Somewhat better airflow. No wheeze/ rhonchi or rales Abdomen:  Soft, non-tender, no HSM Musculoskeletal:  Normal muscle bulk Skin:  No rash   Recent Labs Lab 03/22/12 0452 03/23/12 1110 03/24/12 0517  NA 142 139 141  K 4.1 4.1 3.6  CL 103 100 101  CO2 31 30 29   BUN 29* 29* 26*  CREATININE 1.73* 1.38* 1.34  GLUCOSE 87 87 81    Recent Labs Lab 03/21/12 1538 03/22/12 0452  HGB 17.6* 16.3  HCT 50.4 47.7  WBC 13.2* 10.2  PLT 205 192   No results found. Medications reviewed  ASSESSMENT / PLAN: 1) Asthma with bronchitis- acute exacerbation. Suspect airway inflammation by inhalation of polyurethane dust/ toluene diisocyanate/ TDI and  perhaps also a viral bronchiolitis as triggers. Simple CHF would not commonly produce a thick glue-like mucus by itself. Inspiratory "Mueller maneuver" related to untreated sleep apnea, and increased pulmonary vascular pressure from heart may factor in.   Plan- -d/c pt to home on Dulera 200 two puff bid. -He should have Pulm OV f/u as outpt>>we will arrange for you  Caryl Bis  [phone]  Cell  [phone]  If no response or cell goes to voicemail, call beeper [phone]  Pulmonary and Critical Care Medicine [facility]   03/24/2012, 5:02 PM

## 2012-03-24 NOTE — Progress Notes (Signed)
Pt tele alarm ringing "extreme tachy" with HR in 130's, pt asymptomatic s/p cardiac cath, Dr.Hochrein aware, will continue to monitor

## 2012-03-24 NOTE — Progress Notes (Signed)
   SUBJECTIVE:  Still with cough but improved.  No pain   PHYSICAL EXAM Filed Vitals:   03/23/12 1349 03/23/12 1427 03/23/12 2031 03/24/12 0526  BP: 117/68  111/71 109/76  Pulse: 89  82 84  Temp: 97.9 F (36.6 C)  98 F (36.7 C) 98 F (36.7 C)  TempSrc: Oral  Oral Oral  Resp: 19  20 20   Height:      Weight:    211 lb 10.3 oz (96 kg)  SpO2: 97% 97% 98% 97%   General:  No distress Lungs:  Clear Heart:  Irregular Abdomen:  Positive bowel sounds, no rebound no guarding Extremities:  No edema  LABS: Lab Results  Component Value Date   TROPONINI <0.30 03/22/2012   Results for orders placed during the hospital encounter of 03/21/12 (from the past 24 hour(s))  BASIC METABOLIC PANEL     Status: Abnormal   Collection Time    03/23/12 11:10 AM      Result Value Range   Sodium 139  135 - 145 mEq/L   Potassium 4.1  3.5 - 5.1 mEq/L   Chloride 100  96 - 112 mEq/L   CO2 30  19 - 32 mEq/L   Glucose, Bld 87  70 - 99 mg/dL   BUN 29 (*) 6 - 23 mg/dL   Creatinine, Ser 1.30 (*) 0.50 - 1.35 mg/dL   Calcium 9.2  8.4 - 86.5 mg/dL   GFR calc non Af Amer 50 (*) >90 mL/min   GFR calc Af Amer 58 (*) >90 mL/min  BASIC METABOLIC PANEL     Status: Abnormal   Collection Time    03/24/12  5:17 AM      Result Value Range   Sodium 141  135 - 145 mEq/L   Potassium 3.6  3.5 - 5.1 mEq/L   Chloride 101  96 - 112 mEq/L   CO2 29  19 - 32 mEq/L   Glucose, Bld 81  70 - 99 mg/dL   BUN 26 (*) 6 - 23 mg/dL   Creatinine, Ser 7.84  0.50 - 1.35 mg/dL   Calcium 9.0  8.4 - 69.6 mg/dL   GFR calc non Af Amer 52 (*) >90 mL/min   GFR calc Af Amer 60 (*) >90 mL/min    Intake/Output Summary (Last 24 hours) at 03/24/12 0735 Last data filed at 03/24/12 2952  Gross per 24 hour  Intake   1340 ml  Output   1475 ml  Net   -135 ml    ASSESSMENT AND PLAN:  Cardiomyopathy-  Right and left heart cath today given new decrease in EF. Last dose of apixaban 2/22 at 10 AM.   Atrial fibrillation- Dronedarone  discontinued.  Metoprolol increased.  Resume apixaban after cath.    Chronic kidney disease - Creat is improved.    Cough  -  Being managed for asthma and bronchitis.  I appreciate Dr. Roxy Cedar help.   [name] Woods 03/24/2012 7:35 AM

## 2012-03-24 NOTE — CV Procedure (Signed)
   Cardiac Catheterization Procedure Note  Name: [name] Woods MRN: [id number] DOB: September 03, 1940  Procedure: Right Heart Cath, Left Heart Cath, Selective Coronary Angiography, LV angiography  Indication: CHF, new LV dysfunction   Procedural Details: The right groin was prepped, draped, and anesthetized with 1% lidocaine. Using the modified Seldinger technique a 5 French sheath was placed in the right femoral artery and a 7 French sheath was placed in the right femoral vein. A Swan-Ganz catheter was used for the right heart catheterization. Standard protocol was followed for recording of right heart pressures and sampling of oxygen saturations. Fick cardiac output was calculated. Standard Judkins catheters were used for selective coronary angiography and left ventriculography. There were no immediate procedural complications. The patient was transferred to the post catheterization recovery area for further monitoring.  Procedural Findings: Hemodynamics RA 8 RV 32/8 PA 29/14 mean 20 PCWP 10 LV 104/18 AO 107/64  Oxygen saturations: PA 64 AO 92  Cardiac Output (Fick) 4.7  Cardiac Index (Fick) 2.2   Coronary angiography: Coronary dominance: right  Left mainstem: Widely patent no obstructive disease  Left anterior descending (LAD): Widely patent in proximal vessel. Mid-vessel with mild irregularity. Diag widely patent. No significant stenosis.  Left circumflex (LCx): Large vessel without obstructive disease. Large OM without significant stenosis.  Right coronary artery (RCA): Moderate caliber vessel. Mild irregularity in the mid vessel with no more than 30% stenosis. Large acute marginal branch without significant stenosis. PDA and PLA branches are small without significant disease.  Left ventriculography: deferred  Final Conclusions:   1. Patent coronary arteries with minor nonobstructive CAD 2. Essentially normal hemodynamics  Recommendations: Resume oral lasix and resume  apixaban in the am. Increased beta-blocker was ordered this am for better rate-control.  Tonny Bollman 03/24/2012, 3:20 PM

## 2012-03-25 DIAGNOSIS — R05 Cough: Secondary | ICD-10-CM

## 2012-03-25 LAB — BASIC METABOLIC PANEL
BUN: 27 mg/dL — ABNORMAL HIGH (ref 6–23)
Chloride: 102 mEq/L (ref 96–112)
GFR calc Af Amer: 63 mL/min — ABNORMAL LOW (ref >90)
GFR calc non Af Amer: 55 mL/min — ABNORMAL LOW (ref >90)
Glucose, Bld: 82 mg/dL (ref 70–99)
Potassium: 3.8 mEq/L (ref 3.5–5.1)
Sodium: 141 mEq/L (ref 135–145)

## 2012-03-25 MED ORDER — METOPROLOL SUCCINATE ER 100 MG PO TB24: 100.0000 mg | ORAL_TABLET | Freq: Two times a day (BID) | ORAL | Status: DC

## 2012-03-25 MED ORDER — MOMETASONE FURO-FORMOTEROL FUM 200-5 MCG/ACT IN AERO: 2.0000 | INHALATION_SPRAY | Freq: Two times a day (BID) | RESPIRATORY_TRACT | Status: DC

## 2012-03-25 MED ORDER — APIXABAN 5 MG PO TABS: 5.0000 mg | ORAL_TABLET | Freq: Two times a day (BID) | ORAL | Status: DC

## 2012-03-25 MED ORDER — GUAIFENESIN-DM 100-10 MG/5ML PO SYRP: 15.0000 mL | ORAL_SOLUTION | ORAL | Status: DC | PRN

## 2012-03-25 MED ORDER — GUAIFENESIN ER 600 MG PO TB12: 600.0000 mg | ORAL_TABLET | Freq: Two times a day (BID) | ORAL | Status: DC

## 2012-03-25 NOTE — Progress Notes (Addendum)
PULMONARY  / CRITICAL CARE MEDICINE  Name: [name] Woods MRN: [id number] DOB: September 24, 1940    ADMISSION DATE:  03/21/2012 CONSULTATION DATE:  03/22/12  REFERRING MD : Chales Abrahams  CHIEF COMPLAINT:  Dyspnea with productive cough  BRIEF PATIENT DESCRIPTION: [name] Woods is a 72 y.o. 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  New onset of productive cough with clear mucus x 6 weeks.    SUBJECTIVE: cough is better.  On dulera  VITAL SIGNS: Temp:  [97.2 F (36.2 C)-97.8 F (36.6 C)] 97.2 F (36.2 C) (02/25 0400) Pulse Rate:  [76-110] 90 (02/25 0900) Resp:  [18-20] 19 (02/25 0900) BP: (112-124)/(69-79) 121/76 mmHg (02/25 0900) SpO2:  [94 %-95 %] 95 % (02/25 0400) Weight:  [94.8 kg (208 lb 15.9 oz)] 94.8 kg (208 lb 15.9 oz) (02/25 0400)  PHYSICAL EXAMINATION: General:  Alert, pleasant.  Neuro:  Non-focal. Oriented. Moves all extrem. EOMs and speech seem intact HEENT:  Mucosa normal, Nose clear, conjunctivae clear Neck:  No JVD nor stridor Cardiovascular:  RRR, no murmur Lungs:  Distant breath sounds. Somewhat better airflow. No wheeze/ rhonchi or rales Abdomen:  Soft, non-tender, no HSM Musculoskeletal:  Normal muscle bulk Skin:  No rash   Recent Labs Lab 03/23/12 1110 03/24/12 0517 03/25/12 0635  NA 139 141 141  K 4.1 3.6 3.8  CL 100 101 102  CO2 30 29 27   BUN 29* 26* 27*  CREATININE 1.38* 1.34 1.28  GLUCOSE 87 81 82    Recent Labs Lab 03/21/12 1538 03/22/12 0452  HGB 17.6* 16.3  HCT 50.4 47.7  WBC 13.2* 10.2  PLT 205 192   No results found. Medications reviewed  ASSESSMENT / PLAN: 1) Asthma with bronchitis- acute exacerbation. Suspect airway inflammation by inhalation of polyurethane dust/ toluene diisocyanate/ TDI    Plan- -d/c pt to home on Dulera 200 two puff bid.  Pulm OV f/u as outpt>>keep existing [facility]per  [phone]  Cell  [phone]  If no response or cell goes to voicemail, call beeper [phone]  Pulmonary and Critical Care Medicine [facility]   03/25/2012, 10:03 AM

## 2012-03-25 NOTE — Progress Notes (Signed)
Patient Name: [name] Woods Date of Encounter: 03/25/2012  Principal Problem:   Dyspnea on exertion Active Problems:   HYPERTENSION   Ankylosing spondylitis   Cough   Paroxysmal atrial fibrillation   CAD (coronary artery disease)   GERD (gastroesophageal reflux disease)   Fatigue    SUBJECTIVE: Breathing better, no chest pain  OBJECTIVE Filed Vitals:   03/24/12 1700 03/24/12 2030 03/25/12 0400 03/25/12 0900  BP: 117/79 112/72 124/69 121/76  Pulse: 110 98 78 90  Temp:  97.8 F (36.6 C) 97.2 F (36.2 C)   TempSrc:  Oral Oral   Resp:  20 19 19   Height:      Weight:   208 lb 15.9 oz (94.8 kg)   SpO2: 94% 94% 95%     Intake/Output Summary (Last 24 hours) at 03/25/12 1324 Last data filed at 03/25/12 6578  Gross per 24 hour  Intake    340 ml  Output   1700 ml  Net  -1360 ml   Filed Weights   03/23/12 0605 03/24/12 0526 03/25/12 0400  Weight: 212 lb 4.9 oz (96.3 kg) 211 lb 10.3 oz (96 kg) 208 lb 15.9 oz (94.8 kg)   PHYSICAL EXAM General: Well developed, well nourished, male in no acute distress. Head: Normocephalic, atraumatic.  Neck: Supple without bruits, JVD minimally elevated. Lungs:  Resp regular and unlabored, Rales bilaterally, but no crackles or wheeze Heart: Irregular, S1, S2, no S3, S4, or murmur; no rub. Abdomen: Soft, non-tender, non-distended, BS + x 4.  Extremities: No clubbing, cyanosis, no edema.  Neuro: Alert and oriented X 3. Moves all extremities spontaneously. Psych: Normal affect.  LABS: INR:  Recent Labs  03/24/12 1034  INR 1.08   Basic Metabolic Panel:  Recent Labs  46/96/29 0517 03/25/12 0635  NA 141 141  K 3.6 3.8  CL 101 102  CO2 29 27  GLUCOSE 81 82  BUN 26* 27*  CREATININE 1.34 1.28  CALCIUM 9.0 9.0   BNP: Pro B Natriuretic peptide (BNP)  Date/Time Value Range Status  03/21/2012  3:38 PM 987.0* 0 - 125 pg/mL Final  03/14/2012 11:55 AM 736.4* 0 - 125 pg/mL Final    TELE:   afib, rate generally  well-controlled      Radiology/Studies: Dg Chest 2 View  03/21/2012  *RADIOLOGY REPORT*  Clinical Data: Chest pain  CHEST - 2 VIEW  Comparison: 03/14/2012  Findings: Heart is upper limits normal in size.  No focal airspace opacities or effusions.  No acute bony abnormality.  IMPRESSION: No acute cardiopulmonary disease or change.   Original Report Authenticated By: Charlett Nose, M.D.       Current Medications:  . apixaban  5 mg Oral BID  . furosemide  20 mg Oral Daily  . guaiFENesin  600 mg Oral BID  . metoprolol succinate  100 mg Oral BID  . mometasone-formoterol  2 puff Inhalation BID  . pantoprazole  40 mg Oral Daily  . predniSONE  10 mg Oral Q M,W,F  . sodium chloride  3 mL Intravenous Q12H      ASSESSMENT AND PLAN: Principal Problem:   Dyspnea on exertion - see pulmonary notes. He has been changed from a nebulizers to an inhaler. His cough and shortness of breath has improved. We'll continue the steroid taper he was on prior to admission.    Paroxysmal atrial fibrillation - rate is generally well-controlled. He is currently off the Cardizem he was taking prior to admission. He turned around has  been discontinued. He is tolerating the atrial fibrillation well. His beta blocker has been increased.  Anticoagulation: M.D. advised on continuing ASA 325 mg since patient is now on apixaban. HOWEVER, pt has no insurance and apixaban may be cost-prohibitive. MD advise.  Otherwise, continue home medications. Patient is stable for discharge. Active Problems:   HYPERTENSION   Ankylosing spondylitis   Cough   CAD (coronary artery disease)   GERD (gastroesophageal reflux disease)   Fatigu  History and all data above reviewed.  Patient examined.  I agree with the findings as above.  OK for discharge.  The patient exam reveals [id number]  ,  Lungs: Few coarse crackles  ,  Abd: Positive bowel sounds, no rebound no guarding, Ext Right groin OK  .  All available labs, radiology testing,  previous records reviewed. Agree with documented assessment and plan. OK to go home.  No ASA on discharge.    [name] Woods  1:26 PM  03/25/2012

## 2012-03-25 NOTE — Progress Notes (Signed)
Patient Name: [name] Woods Date of Encounter: 03/25/2012  Principal Problem:   Dyspnea on exertion Active Problems:   HYPERTENSION   Ankylosing spondylitis   Cough   Paroxysmal atrial fibrillation   CAD (coronary artery disease)   GERD (gastroesophageal reflux disease)   Fatigue    SUBJECTIVE: Breathing better, no chest pain  OBJECTIVE Filed Vitals:   03/24/12 1700 03/24/12 2030 03/25/12 0400 03/25/12 0900  BP: 117/79 112/72 124/69 121/76  Pulse: 110 98 78 90  Temp:  97.8 F (36.6 C) 97.2 F (36.2 C)   TempSrc:  Oral Oral   Resp:  20 19 19   Height:      Weight:   208 lb 15.9 oz (94.8 kg)   SpO2: 94% 94% 95%     Intake/Output Summary (Last 24 hours) at 03/25/12 1228 Last data filed at 03/25/12 1610  Gross per 24 hour  Intake    340 ml  Output   2025 ml  Net  -1685 ml   Filed Weights   03/23/12 0605 03/24/12 0526 03/25/12 0400  Weight: 212 lb 4.9 oz (96.3 kg) 211 lb 10.3 oz (96 kg) 208 lb 15.9 oz (94.8 kg)   PHYSICAL EXAM General: Well developed, well nourished, male in no acute distress. Head: Normocephalic, atraumatic.  Neck: Supple without bruits, JVD minimally elevated. Lungs:  Resp regular and unlabored, Rales bilaterally, but no crackles or wheeze Heart: Irregular, S1, S2, no S3, S4, or murmur; no rub. Abdomen: Soft, non-tender, non-distended, BS + x 4.  Extremities: No clubbing, cyanosis, no edema.  Neuro: Alert and oriented X 3. Moves all extremities spontaneously. Psych: Normal affect.  LABS: INR: Recent Labs  03/24/12 1034  INR 1.08   Basic Metabolic Panel: Recent Labs  03/24/12 0517 03/25/12 0635  NA 141 141  K 3.6 3.8  CL 101 102  CO2 29 27  GLUCOSE 81 82  BUN 26* 27*  CREATININE 1.34 1.28  CALCIUM 9.0 9.0   BNP: Pro B Natriuretic peptide (BNP)  Date/Time Value Range Status  03/21/2012  3:38 PM 987.0* 0 - 125 pg/mL Final  03/14/2012 11:55 AM 736.4* 0 - 125 pg/mL Final    TELE:   afib, rate generally well-controlled        Radiology/Studies: Dg Chest 2 View  03/21/2012  *RADIOLOGY REPORT*  Clinical Data: Chest pain  CHEST - 2 VIEW  Comparison: 03/14/2012  Findings: Heart is upper limits normal in size.  No focal airspace opacities or effusions.  No acute bony abnormality.  IMPRESSION: No acute cardiopulmonary disease or change.   Original Report Authenticated By: Charlett Nose, M.D.       Current Medications:  . apixaban  5 mg Oral BID  . furosemide  20 mg Oral Daily  . guaiFENesin  600 mg Oral BID  . metoprolol succinate  100 mg Oral BID  . mometasone-formoterol  2 puff Inhalation BID  . pantoprazole  40 mg Oral Daily  . predniSONE  10 mg Oral Q M,W,F  . sodium chloride  3 mL Intravenous Q12H      ASSESSMENT AND PLAN: Principal Problem:   Dyspnea on exertion - see pulmonary notes. He has been changed from a nebulizers to an inhaler. His cough and shortness of breath has improved. We'll continue the steroid taper he was on prior to admission.    Paroxysmal atrial fibrillation - rate is generally well-controlled. He is currently off the Cardizem he was taking prior to admission. He turned around has been  discontinued. He is tolerating the atrial fibrillation well. His beta blocker has been increased.  Anticoagulation: M.D. advised on continuing ASA 325 mg since patient is now on apixaban. HOWEVER, pt has no insurance and apixaban may be cost-prohibitive. MD advise.  Otherwise, continue home medications. Patient is stable for discharge. Active Problems:   HYPERTENSION   Ankylosing spondylitis   Cough   CAD (coronary artery disease)   GERD (gastroesophageal reflux disease)   Fatigue   Signed, Theodore Demark , PA-C 12:28 PM 03/25/2012

## 2012-03-25 NOTE — Progress Notes (Signed)
CARDIOLOGY DISCHARGE SUMMARY   Patient ID: CHRISTIAAN STREBECK MRN: [id number] DOB/AGE: [birth year] 71 y.o.  Admit date: 03/21/2012 Discharge date: 03/25/2012  Primary Discharge Diagnosis:    Dyspnea on exertion -  secondary to asthma with bronchitis, acute exacerbation  Secondary Discharge Diagnosis:    HYPERTENSION   Ankylosing spondylitis   Cough   Paroxysmal atrial fibrillation   CAD (coronary artery disease)   GERD (gastroesophageal reflux disease)   Fatigue  Consults: Pulmonary  Procedures:Right Heart Cath, Left Heart Cath, Selective Coronary Angiography, LV angiography  Hospital Course:  Mr. Lumadue is a 72 year old male with a history of nonobstructive coronary artery disease. He came to the hospital with cough and dyspnea. He was admitted for further evaluation and treatment.  Cardiac enzymes were negative for MI. He was in atrial fibrillation but his ECG was unchanged. A 2-D echocardiogram was performed with results below. His EF was lower than previously documented. There was concern for a cardiac source of his dyspnea so his apixaban was held so that he could have a heart catheterization. He had a right and left heart cath on 03/24/2012. Full results are below but his hemodynamics were stable and he had no critical coronary artery disease.  For his dyspnea and cough, a pulmonary consult was called. He was felt to have asthma with bronchitis and an acute exacerbation. He was having very thick mucus but there was concern for untreated sleep apnea as a factor. He had nebulizers, Mucinex and a flutter valve added. As his condition improved, he was changed to inhalers. He is to followup with Dr. Marchelle Gearing as an outpatient.  For his atrial fibrillation, since he was not maintaining sinus rhythm, dronedarone was discontinued. His metoprolol was increased for better heart rate control. His Cardizem was also discontinued. He tolerated this well. He is started on apixaban for  anticoagulation. Since he is on apixaban, his aspirin is discontinued.  By 03/25/2012, Mr. Fuente's respiratory status was improved and he had no further chest pain. He is considered stable for discharge, to follow up as an outpatient.  Labs:   Lab Results  Component Value Date   WBC 10.2 03/22/2012   HGB 16.3 03/22/2012   HCT 47.7 03/22/2012   MCV 86.9 03/22/2012   PLT 192 03/22/2012     Recent Labs Lab 03/25/12 0635  NA 141  K 3.8  CL 102  CO2 27  BUN 27*  CREATININE 1.28  CALCIUM 9.0  GLUCOSE 82   Lab Results  Component Value Date   TROPONINI <0.30 03/22/2012   Lipid Panel     Component Value Date/Time   CHOL 125 03/22/2012 0452   TRIG 160* 03/22/2012 0452   HDL 32* 03/22/2012 0452   CHOLHDL 3.9 03/22/2012 0452   VLDL 32 03/22/2012 0452   LDLCALC 61 03/22/2012 0452    Pro B Natriuretic peptide (BNP)  Date/Time Value Range Status  03/21/2012  3:38 PM 987.0* 0 - 125 pg/mL Final  03/14/2012 11:55 AM 736.4* 0 - 125 pg/mL Final    Recent Labs  03/24/12 1034  INR 1.08      Radiology: Dg Chest 2 View 03/21/2012  *RADIOLOGY REPORT*  Clinical Data: Chest pain  CHEST - 2 VIEW  Comparison: 03/14/2012  Findings: Heart is upper limits normal in size.  No focal airspace opacities or effusions.  No acute bony abnormality.  IMPRESSION: No acute cardiopulmonary disease or change.   Original Report Authenticated By: Charlett Nose, M.D.     Cardiac Cath:  03/24/2012 Hemodynamics  RA 8  RV 32/8  PA 29/14 mean 20  PCWP 10  LV 104/18  AO 107/64  Oxygen saturations:  PA 64  AO 92  Cardiac Output (Fick) 4.7  Cardiac Index (Fick) 2.2  Coronary angiography:  Coronary dominance: right  Left mainstem: Widely patent no obstructive disease  Left anterior descending (LAD): Widely patent in proximal vessel. Mid-vessel with mild irregularity. Diag widely patent. No significant stenosis.  Left circumflex (LCx): Large vessel without obstructive disease. Large OM without significant  stenosis.  Right coronary artery (RCA): Moderate caliber vessel. Mild irregularity in the mid vessel with no more than 30% stenosis. Large acute marginal branch without significant stenosis. PDA and PLA branches are small without significant disease.  Left ventriculography: deferred 1. Patent coronary arteries with minor nonobstructive CAD  2. Essentially normal hemodynamics  Recommendations: Resume oral lasix and resume apixaban in the am. Increased beta-blocker was ordered this am for better rate-control.  EKG: 23-Mar-2012 06:55:33  Atrial fibrillation ST abnormality, possible digitalis effect Abnormal QRS-T angle, consider primary T wave abnormality Abnormal ECG 17mm/s 2mm/mV 100Hz  8.0.1 12SL 239 CID: 20 Referred by: Unconfirmed Vent. rate 84 BPM PR interval * ms QRS duration 100 ms QT/QTc 390/460 ms P-R-T axes * 71 -2  Echo: 03/22/2012 - Left ventricle: The cavity size was normal. Systolic function was mildly to moderately reduced. The estimated ejection fraction was in the range of 40% to 45%. Although no diagnostic regional wall motion abnormality was identified, this possibility cannot be completely excluded on the basis of this study. - Aortic valve: Mild regurgitation. - Mitral valve: Mild regurgitation. - Left atrium: The atrium was mildly dilated. - Right ventricle: The cavity size was mildly dilated. Wall thickness was normal. - Tricuspid valve: Mild-moderate regurgitation. Impressions: - EF has decreased from 2003 Cath 65%.  FOLLOW UP PLANS AND APPOINTMENTS No Known Allergies   Medication List    STOP taking these medications       aspirin 325 MG tablet     dronedarone 400 MG tablet  Commonly known as:  MULTAQ     lisinopril 40 MG tablet  Commonly known as:  PRINIVIL,ZESTRIL     omeprazole 20 MG capsule  Commonly known as:  PRILOSEC      TAKE these medications       apixaban 5 MG Tabs tablet  Commonly known as:  ELIQUIS  Take 1 tablet (5 mg  total) by mouth 2 (two) times daily.     CARTIA XT 180 MG 24 hr capsule  Generic drug:  diltiazem  Take 180 mg by mouth daily.     furosemide 20 MG tablet  Commonly known as:  LASIX  Take 1 tablet (20 mg total) by mouth daily.     guaiFENesin 600 MG 12 hr tablet  Commonly known as:  MUCINEX  Take 1 tablet (600 mg total) by mouth 2 (two) times daily.     guaiFENesin-dextromethorphan 100-10 MG/5ML syrup  Commonly known as:  ROBITUSSIN DM  Take 15 mLs by mouth every 4 (four) hours as needed for cough.     HYDROcodone-acetaminophen 7.5-325 MG per tablet  Commonly known as:  NORCO  Take 1 tablet by mouth every 8 (eight) hours as needed.     IMODIUM PO  Take by mouth as needed.     metoprolol succinate 100 MG 24 hr tablet  Commonly known as:  TOPROL-XL  Take 1 tablet (100 mg total) by mouth 2 (two) times daily.  mometasone-formoterol 200-5 MCG/ACT Aero  Commonly known as:  DULERA  Inhale 2 puffs into the lungs 2 (two) times daily.     predniSONE 20 MG tablet  Commonly known as:  DELTASONE  1 tab x1 week, a half a tab x1 week, then a half a tablet Monday Wednesday Friday for a 3 week taper     tadalafil 20 MG tablet  Commonly known as:  CIALIS  Take 20 mg by mouth daily as needed.        Discharge Orders   Future Appointments Provider Department Dept Phone   04/07/2012 3:15 PM Kalman Shan, MD [facility] [phone]   04/08/2012 11:45 AM Rollene Rotunda, MD [facility] Main Office [place]) [phone]   Future Orders Complete By Expires     Diet - low sodium heart healthy  As directed     Increase activity slowly  As directed       Follow-up Information   Follow up with [facility], MD On 04/07/2012. (at 3:15 pm)    Contact information:   [address] [place] [address] [phone]       Follow up with Rollene Rotunda, MD On 04/08/2012. (at 11:45 am)    Contact information:   [address] [place] [address] [phone]       BRING ALL MEDICATIONS WITH YOU TO FOLLOW UP APPOINTMENTS  Time spent with patient to include physician time: 45 min Signed: Theodore Demark 03/25/2012, 1:42 PM Co-Sign MD

## 2012-03-25 NOTE — Progress Notes (Signed)
Pt d/c to home with daughter. D/c instructions and medications reviewed with PT and daughter. Pt and daughter state understanding. All Pt and daughter questions answered.

## 2012-03-25 NOTE — Progress Notes (Signed)
03/25/12 1500 In to speak with pt. about medication assistance for Elioquis.  This NCM gave pt. a co-pay card for Elioquis and explained to pt. how to activate card and that it would last for up to 12 months.  Explained how to enroll into the patient assistance program.  Pt. appreciative of assistance.  Pt. to dc home today. Tera Mater, RN, BSN NCM [phone]

## 2012-03-31 ENCOUNTER — Other Ambulatory Visit: Payer: Self-pay

## 2012-03-31 ENCOUNTER — Telehealth: Payer: Self-pay | Admitting: Internal Medicine

## 2012-03-31 NOTE — Telephone Encounter (Signed)
Spoke with pt He is asking for written rxs for lasix, eliquis, toprol xl and dulera I advised will have to check with MR, b/c I only see where we prescribed the dulera He states that he is not out of his meds at this time and has plenty to last until ov with MR next wk He states will discuss this at ov and get whatever he is needing at that time Pt states nothing further needed

## 2012-04-01 ENCOUNTER — Telehealth: Payer: Self-pay | Admitting: Cardiovascular Disease

## 2012-04-01 NOTE — Discharge Summary (Signed)
CARDIOLOGY DISCHARGE SUMMARY   Patient ID:  [name] Woods  MRN: [id number]  DOB/AGE: 02-Mar-1940 72 y.o.  Admit date: 03/21/2012  Discharge date: 03/25/2012  Primary Discharge Diagnosis: Dyspnea on exertion - secondary to asthma with bronchitis, acute exacerbation  Secondary Discharge Diagnosis:  HYPERTENSION  Ankylosing spondylitis  Cough  Paroxysmal atrial fibrillation  CAD (coronary artery disease)  GERD (gastroesophageal reflux disease)  Fatigue  Consults: Pulmonary  Procedures:Right Heart Cath, Left Heart Cath, Selective Coronary Angiography, LV angiography  Hospital Course: [name] Woods is a 72 year old male with a history of nonobstructive coronary artery disease. He came to the hospital with cough and dyspnea. He was admitted for further evaluation and treatment.  Cardiac enzymes were negative for MI. He was in atrial fibrillation but his ECG was unchanged. A 2-D echocardiogram was performed with results below. His EF was lower than previously documented. There was concern for a cardiac source of his dyspnea so his apixaban was held so that he could have a heart catheterization. He had a right and left heart cath on 03/24/2012. Full results are below but his hemodynamics were stable and he had no critical coronary artery disease.  For his dyspnea and cough, a pulmonary consult was called. He was felt to have asthma with bronchitis and an acute exacerbation. He was having very thick mucus but there was concern for untreated sleep apnea as a factor. He had nebulizers, Mucinex and a flutter valve added. As his condition improved, he was changed to inhalers. He is to followup with Dr. Marchelle Gearing as an outpatient.  For his atrial fibrillation, since he was not maintaining sinus rhythm, dronedarone was discontinued. His metoprolol was increased for better heart rate control. His Cardizem was also discontinued. He tolerated this well. He is started on apixaban for anticoagulation. Since he  is on apixaban, his aspirin is discontinued.  By 03/25/2012, [name] Woods respiratory status was improved and he had no further chest pain. He is considered stable for discharge, to follow up as an outpatient.  Labs:  Lab Results   Component  Value  Date    WBC  10.2  03/22/2012    HGB  16.3  03/22/2012    HCT  47.7  03/22/2012    MCV  86.9  03/22/2012    PLT  192  03/22/2012     Recent Labs  Lab  03/25/12 0635   NA  141   K  3.8   CL  102   CO2  27   BUN  27*   CREATININE  1.28   CALCIUM  9.0   GLUCOSE  82    Lab Results   Component  Value  Date    TROPONINI  <0.30  03/22/2012    Lipid Panel    Component  Value  Date/Time    CHOL  125  03/22/2012 0452    TRIG  160*  03/22/2012 0452    HDL  32*  03/22/2012 0452    CHOLHDL  3.9  03/22/2012 0452    VLDL  32  03/22/2012 0452    LDLCALC  61  03/22/2012 0452    Pro B Natriuretic peptide (BNP)   Date/Time  Value  Range  Status   03/21/2012 3:38 PM  987.0*  0 - 125 pg/mL  Final   03/14/2012 11:55 AM  736.4*  0 - 125 pg/mL  Final     Recent Labs   03/24/12 1034   INR  1.08    Radiology:  Dg Chest 2 View  03/21/2012 *RADIOLOGY REPORT* Clinical Data: Chest pain CHEST - 2 VIEW Comparison: 03/14/2012 Findings: Heart is upper limits normal in size. No focal airspace opacities or effusions. No acute bony abnormality. IMPRESSION: No acute cardiopulmonary disease or change. Original Report Authenticated By: Charlett Nose, M.D.  Cardiac Cath: 03/24/2012  Hemodynamics  RA 8  RV 32/8  PA 29/14 mean 20  PCWP 10  LV 104/18  AO 107/64  Oxygen saturations:  PA 64  AO 92  Cardiac Output (Fick) 4.7  Cardiac Index (Fick) 2.2  Coronary angiography:  Coronary dominance: right  Left mainstem: Widely patent no obstructive disease  Left anterior descending (LAD): Widely patent in proximal vessel. Mid-vessel with mild irregularity. Diag widely patent. No significant stenosis.  Left circumflex (LCx): Large vessel without obstructive disease.  Large OM without significant stenosis.  Right coronary artery (RCA): Moderate caliber vessel. Mild irregularity in the mid vessel with no more than 30% stenosis. Large acute marginal branch without significant stenosis. PDA and PLA branches are small without significant disease.  Left ventriculography: deferred  1. Patent coronary arteries with minor nonobstructive CAD  2. Essentially normal hemodynamics  Recommendations: Resume oral lasix and resume apixaban in the am. Increased beta-blocker was ordered this am for better rate-control.  EKG: 23-Mar-2012 06:55:33  Atrial fibrillation  ST abnormality, possible digitalis effect  Abnormal QRS-T angle, consider primary T wave abnormality  Abnormal ECG  68mm/s 21mm/mV 100Hz  8.0.1 12SL 239 CID: 20  Referred by: Unconfirmed  Vent. rate 84 BPM  PR interval * ms  QRS duration 100 ms  QT/QTc 390/460 ms  P-R-T axes * 71 -2  Echo: 03/22/2012  - Left ventricle: The cavity size was normal. Systolic function was mildly to moderately reduced. The estimated ejection fraction was in the range of 40% to 45%. Although no diagnostic regional wall motion abnormality was identified, this possibility cannot be completely excluded on the basis of this study. - Aortic valve: Mild regurgitation. - Mitral valve: Mild regurgitation. - Left atrium: The atrium was mildly dilated. - Right ventricle: The cavity size was mildly dilated. Wall thickness was normal. - Tricuspid valve: Mild-moderate regurgitation. Impressions: - EF has decreased from 2003 Cath 65%.  FOLLOW UP PLANS AND APPOINTMENTS  No Known Allergies    Medication List     STOP taking these medications       aspirin 325 MG tablet    dronedarone 400 MG tablet    Commonly known as: MULTAQ    lisinopril 40 MG tablet    Commonly known as: PRINIVIL,ZESTRIL    omeprazole 20 MG capsule    Commonly known as: PRILOSEC     TAKE these medications       apixaban 5 MG Tabs tablet    Commonly known  as: ELIQUIS    Take 1 tablet (5 mg total) by mouth 2 (two) times daily.    CARTIA XT 180 MG 24 hr capsule    Generic drug: diltiazem    Take 180 mg by mouth daily.    furosemide 20 MG tablet    Commonly known as: LASIX    Take 1 tablet (20 mg total) by mouth daily.    guaiFENesin 600 MG 12 hr tablet    Commonly known as: MUCINEX    Take 1 tablet (600 mg total) by mouth 2 (two) times daily.    guaiFENesin-dextromethorphan 100-10 MG/5ML syrup    Commonly known as: ROBITUSSIN DM    Take 15 mLs by mouth  every 4 (four) hours as needed for cough.    HYDROcodone-acetaminophen 7.5-325 MG per tablet    Commonly known as: NORCO    Take 1 tablet by mouth every 8 (eight) hours as needed.    IMODIUM PO    Take by mouth as needed.    metoprolol succinate 100 MG 24 hr tablet    Commonly known as: TOPROL-XL    Take 1 tablet (100 mg total) by mouth 2 (two) times daily.    mometasone-formoterol 200-5 MCG/ACT Aero    Commonly known as: DULERA    Inhale 2 puffs into the lungs 2 (two) times daily.    predniSONE 20 MG tablet    Commonly known as: DELTASONE    1 tab x1 week, a half a tab x1 week, then a half a tablet Monday Wednesday Friday for a 3 week taper    tadalafil 20 MG tablet    Commonly known as: CIALIS    Take 20 mg by mouth daily as needed.      Discharge Orders    Future Appointments  Provider  Department  Dept Phone    04/07/2012 3:15 PM  Kalman Shan, MD  [facility]  [phone]    04/08/2012 11:45 AM  Rollene Rotunda, MD  [facility] Main Office [place])  [phone]    Future Orders  Complete By  Expires     Diet - low sodium heart healthy  As directed      Increase activity slowly  As directed        Follow-up Information    Follow up with [facility], MD On 04/07/2012. (at 3:15 pm)    Contact information:    [address] [place] [address]  [phone]       Follow up with Rollene Rotunda, MD On 04/08/2012. (at 11:45 am)    Contact  information:    [address]  [address] [place]  [place] [address]  [phone]      BRING ALL MEDICATIONS WITH YOU TO FOLLOW UP APPOINTMENTS  Time spent with patient to include physician time: 45 min  Signed:  Theodore Demark  03/25/2012, 1:42 PM  Co-Sign MD  Cosigned by: Rollene Rotunda, MD [03/25/2012 2:19 PM]   Revision History.Marland KitchenMarland Kitchen

## 2012-04-01 NOTE — Telephone Encounter (Signed)
[facility] [employer] , pt needs refill of lasix, pt out needs asap

## 2012-04-02 ENCOUNTER — Other Ambulatory Visit: Payer: Self-pay | Admitting: *Deleted

## 2012-04-02 MED ORDER — FUROSEMIDE 20 MG PO TABS: 20.0000 mg | ORAL_TABLET | Freq: Every day | ORAL | Status: DC

## 2012-04-07 ENCOUNTER — Ambulatory Visit (INDEPENDENT_AMBULATORY_CARE_PROVIDER_SITE_OTHER): Payer: Medicare Other | Admitting: Internal Medicine

## 2012-04-07 ENCOUNTER — Encounter: Payer: Self-pay | Admitting: Internal Medicine

## 2012-04-07 VITALS — BP 128/78 | HR 78 | Temp 98.2°F | Ht 72.0 in | Wt 215.2 lb

## 2012-04-07 MED ORDER — MOMETASONE FURO-FORMOTEROL FUM 200-5 MCG/ACT IN AERO: 2.0000 | INHALATION_SPRAY | Freq: Two times a day (BID) | RESPIRATORY_TRACT | Status: DC

## 2012-04-07 MED ORDER — FLUTICASONE PROPIONATE 50 MCG/ACT NA SUSP: 2.0000 | Freq: Every day | NASAL | Status: DC

## 2012-04-07 NOTE — Patient Instructions (Addendum)
Glad cough is much improved after they stopped lisinopril and started on Lasix Unclear why you're having residual cough To sort this out  - stop dulera  - Please use 3% hypertonic Lloyd Huger med sinus nasal spray that you can get over-the-counter at [facility] or [facility]  -  take generic fluticasone inhaler 2 squirts each nostril daily  -Please have full pulmonary function testing within the next 2-3 weeks (after being off dulera) and as soon as  finished it please call and let us know so I can review it and call you back about additional breathing test on the CT scan of the chest    #Followup - Based on results of pulmonary function testing which you need to have within the next 2-3 weeks at any location that is convenient to  you

## 2012-04-07 NOTE — Progress Notes (Signed)
Subjective:    Patient ID: [name] Woods, male    DOB: [birth year], 72 y.o.   MRN: [id number]  HPI PCP is TODD,JEFFREY ALLEN, MD REferred for cough   OV 04/07/2012  He  date distinct increase/ new cough x 6 weeks. He may have had a non-febrile cold near the onset, but no purulent sputum. He did cut holes in a new polyurethane sealed floor w/o respiratory protection at that time. Cough began dry but progressed to productive of thick clear, choking mucus. Worse at night- wakes him 2-3x. Tried wife's asthma inhaler once- helped.   Then admitted with dyspnea as well 03/21/12 to 03/25/12- Noted to be in worseing of preexistent A Fib but with new low EF with echo ef 45% but RHC and LHC normal. ACE inhibitor stopped.  Also, Rx with lasix  (new lasix just prior to the amdission) Lopressor increaesd. There was pulm consult at this time.  Since then cough better 70%.  Currently cough is moderate. Cough currently made worse by answering phone, sleeping/lying down. HE feels theere is mucus in back of throat. Feels tickle in throat with some mild sinus drainage. Cough ? improved by inhalers ; currently on dulera    He reports dx of obstructive sleep apnea by [facility]?) but never treated. Family confirms loud snore     Dr Gretta Cool Reflux Symptom Index (> 13-15 suggestive of LPR cough) 0 -> 5  =  none ->severe problem 04/07/2012   Hoarseness of problem with voice 5  Clearing  Of Throat 3  Excess throat mucus or feeling of post nasal drip 3  Difficulty swallowing food, liquid or tablets 3  Cough after eating or lying down 3  Breathing difficulties or choking episodes 5  Troublesome or annoying cough 5  Sensation of something sticking in throat or lump in throat 5  Heartburn, chest pain, indigestion, or stomach acid coming up 0  TOTAL 32      Kouffman Reflux v Neurogenic Cough Differentiator Reflux  Do you awaken from a sound sleep coughing violently?                            With  trouble breathing? Yes  Do you have choking episodes when you cannot  Get enough air, gasping for air ?              Yes  Do you usually cough when you lie down into  The bed, or when you just lie down to rest ?                          no  Do you usually cough after meals or eating?         no  Do you cough when (or after) you bend over?    no  GERD SCORE  2  Kouffman Reflux v Neurogenic Cough Differentiator Neurogenic  Do you more-or-less cough all day long? y  Does change of temperature make you cough? n  Does laughing or chuckling cause you to cough? n  Do fumes (perfume, automobile fumes, burned  Toast, etc.,) cause you to cough ?      n  Does speaking, singing, or talking on the phone cause you to cough   ?               y  Neurogenic/Airway score 2     Sinus - mild  sinus drainage currently  Allergy - denies spirng allpergies   BP - ACE inhibitor stopped feb 2014; currently in lopresso  GI/GERD Denies GERD Body mass index is 29.18 kg/(m^2).    Pulmonary 06/21/08 - CT abd lung cut - some non specific fibrotic stranding in bases CXR Feb 2014 - no acute proicess  EXposure  reports that he has never smoked. He has never used smokeless tobacco. 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 Denies mold inhouse.    Past Medical History  Diagnosis Date  . Ventricular hypertrophy   . Paroxysmal atrial fibrillation   . Hiatal hernia   . 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 Cholelithiasis   . Sleep apnea   . Nephrolithiasis   . Arthritis   . Hypertension   . Ankylosing spondylitis   . Atrial fibrillation   . GERD (gastroesophageal reflux disease)   . CAD (coronary artery disease) 2003    30% mid LAD lesion on cardiac cath     Family History  Problem Relation Age of Onset  . Colon cancer Neg Hx   . Heart disease Father 25    MI- deceased  . Heart disease Brother 36    "Died in his sleep"     History   Social History  . Marital Status: Married    Spouse  Name: N/A    Number of Children: 4  . Years of Education: N/A   Occupational History  . Retired     Nurse, children's   Social History Main Topics  . Smoking status: Never Smoker   . Smokeless tobacco: Never Used  . Alcohol Use: No  . Drug Use: No  . Sexually Active: Not on file   Other Topics Concern  . Not on file   Social History Narrative   4 caffeine drinks daily      No Known Allergies   Outpatient Prescriptions Prior to Visit  Medication Sig Dispense Refill  . apixaban (ELIQUIS) 5 MG TABS tablet Take 1 tablet (5 mg total) by mouth 2 (two) times daily.  60 tablet  11  . diltiazem (CARTIA XT) 180 MG 24 hr capsule Take 180 mg by mouth daily.      . furosemide (LASIX) 20 MG tablet Take 1 tablet (20 mg total) by mouth daily.  15 tablet  0  . guaiFENesin (MUCINEX) 600 MG 12 hr tablet Take 1 tablet (600 mg total) by mouth 2 (two) times daily.  40 tablet  0  . guaiFENesin-dextromethorphan (ROBITUSSIN DM) 100-10 MG/5ML syrup Take 15 mLs by mouth every 4 (four) hours as needed for cough.  118 mL    . HYDROcodone-acetaminophen (NORCO) 7.5-325 MG per tablet Take 1 tablet by mouth every 8 (eight) hours as needed.  100 tablet  1  . Loperamide HCl (IMODIUM PO) Take by mouth as needed.        . metoprolol succinate (TOPROL-XL) 100 MG 24 hr tablet Take 1 tablet (100 mg total) by mouth 2 (two) times daily.  60 tablet  11  . mometasone-formoterol (DULERA) 200-5 MCG/ACT AERO Inhale 2 puffs into the lungs 2 (two) times daily.  1 Inhaler  0  . tadalafil (CIALIS) 20 MG tablet Take 20 mg by mouth daily as needed.        . predniSONE (DELTASONE) 20 MG tablet 1 tab x1 week, a half a tab x1 week, then a half a tablet Monday Wednesday Friday for a 3 week taper  30 tablet  1  No facility-administered medications prior to visit.      Review of Systems  Constitutional: Negative for fever and unexpected weight change.  HENT: Positive for voice change. Negative for ear pain, nosebleeds, congestion,  sore throat, rhinorrhea, sneezing, trouble swallowing, dental problem, postnasal drip and sinus pressure.   Eyes: Negative for redness and itching.  Respiratory: Positive for cough. Negative for chest tightness, shortness of breath and wheezing.   Cardiovascular: Negative for palpitations and leg swelling.  Gastrointestinal: Negative for nausea and vomiting.  Genitourinary: Negative for dysuria.  Musculoskeletal: Negative for joint swelling.  Skin: Negative for rash.  Neurological: Negative for headaches.  Hematological: Does not bruise/bleed easily.  Psychiatric/Behavioral: Negative for dysphoric mood. The patient is not nervous/anxious.        Objective:   Physical Exam  Nursing note and vitals reviewed. Constitutional: He is oriented to person, place, and time. He appears well-developed and well-nourished. No distress.  HENT:  Head: Normocephalic and atraumatic.  Right Ear: External ear normal.  Left Ear: External ear normal.  Mouth/Throat: Oropharynx is clear and moist. No oropharyngeal exudate.  Mild post nasal drip present  Eyes: Conjunctivae and EOM are normal. Pupils are equal, round, and reactive to light. Right eye exhibits no discharge. Left eye exhibits no discharge. No scleral icterus.  Neck: Normal range of motion. Neck supple. No JVD present. No tracheal deviation present. No thyromegaly present.  Cardiovascular: Normal rate, regular rhythm and intact distal pulses.  Exam reveals no gallop and no friction rub.   No murmur heard. Pulmonary/Chest: Effort normal and breath sounds normal. No respiratory distress. He has no wheezes. He has no rales. He exhibits no tenderness.  Abdominal: Soft. Bowel sounds are normal. He exhibits no distension and no mass. There is no tenderness. There is no rebound and no guarding.  Visceral obesity present Body mass index is 29.18 kg/(m^2).   Musculoskeletal: Normal range of motion. He exhibits no edema and no tenderness.   Lymphadenopathy:    He has no cervical adenopathy.  Neurological: He is alert and oriented to person, place, and time. He has normal reflexes. No cranial nerve deficit. Coordination normal.  Skin: Skin is warm and dry. No rash noted. He is not diaphoretic. No erythema. No pallor.  Psychiatric: He has a normal mood and affect. His behavior is normal. Judgment and thought content normal.          Assessment & Plan:

## 2012-04-08 ENCOUNTER — Encounter: Payer: Self-pay | Admitting: Cardiology

## 2012-04-08 ENCOUNTER — Ambulatory Visit (INDEPENDENT_AMBULATORY_CARE_PROVIDER_SITE_OTHER): Payer: Medicare Other | Admitting: Cardiology

## 2012-04-08 ENCOUNTER — Inpatient Hospital Stay: Payer: Medicare Other | Admitting: Adult Health

## 2012-04-08 VITALS — BP 112/78 | HR 92 | Ht 72.0 in | Wt 212.0 lb

## 2012-04-08 DIAGNOSIS — I2510 Atherosclerotic heart disease of native coronary artery without angina pectoris: Secondary | ICD-10-CM

## 2012-04-08 DIAGNOSIS — I480 Paroxysmal atrial fibrillation: Secondary | ICD-10-CM

## 2012-04-08 DIAGNOSIS — I10 Essential (primary) hypertension: Secondary | ICD-10-CM

## 2012-04-08 DIAGNOSIS — I517 Cardiomegaly: Secondary | ICD-10-CM

## 2012-04-08 MED ORDER — DILTIAZEM HCL ER COATED BEADS 180 MG PO CP24: 180.0000 mg | ORAL_CAPSULE | Freq: Every day | ORAL | Status: DC

## 2012-04-08 MED ORDER — FUROSEMIDE 20 MG PO TABS: 20.0000 mg | ORAL_TABLET | Freq: Every day | ORAL | Status: DC

## 2012-04-08 MED ORDER — METOPROLOL SUCCINATE ER 100 MG PO TB24: 100.0000 mg | ORAL_TABLET | Freq: Two times a day (BID) | ORAL | Status: DC

## 2012-04-08 MED ORDER — APIXABAN 5 MG PO TABS: 5.0000 mg | ORAL_TABLET | Freq: Two times a day (BID) | ORAL | Status: DC

## 2012-04-08 NOTE — Patient Instructions (Addendum)
The current medical regimen is effective;  continue present plan and medications.  Your physician has recommended that you wear a holter monitor. Holter monitors are medical devices that record the heart's electrical activity. Doctors most often use these monitors to diagnose arrhythmias. Arrhythmias are problems with the speed or rhythm of the heartbeat. The monitor is a small, portable device. You can wear one while you do your normal daily activities. This is usually used to diagnose what is causing palpitations/syncope (passing out).  Follow up in 6 months with Dr Hochrein.  You will receive a letter in the mail 2 months before you are due.  Please call us when you receive this letter to schedule your follow up appointment.  

## 2012-04-08 NOTE — Progress Notes (Signed)
HPI The patient presentsafter hospitalization for management of paroxysmal atrial fibrillation and dyspnea. He was admitted cough and dyspnea and treated for asthma and bronchitis. He did have paroxysmal atrial fibrillation. He was found to have a slightly reduced ejection fraction and was taken off of dronedarone.  He was placed on Eliquis for stroke prophylaxis. We also stopped his ACE inhibitor because of cough. Cardiac catheterization demonstrated some mild nonobstructive coronary disease.  He's been back to follow with pulmonary since discharge. He reports his breathing is improved about 70%. He's having much less cough. He's not noticing any palpitations, presyncope or syncope. He's not having any chest pressure, neck or arm discomfort. He's had no weight gain or edema. He's had no PND or orthopnea. He's had no problems taking the blood thinner with no bleeding or excessive bruising.  No Known Allergies  Current Outpatient Prescriptions  Medication Sig Dispense Refill  . apixaban (ELIQUIS) 5 MG TABS tablet Take 1 tablet (5 mg total) by mouth 2 (two) times daily.  60 tablet  11  . diltiazem (CARTIA XT) 180 MG 24 hr capsule Take 180 mg by mouth daily.      . fluticasone (FLONASE) 50 MCG/ACT nasal spray Place 2 sprays into the nose daily.  16 g  2  . furosemide (LASIX) 20 MG tablet Take 1 tablet (20 mg total) by mouth daily.  15 tablet  0  . guaiFENesin (MUCINEX) 600 MG 12 hr tablet Take 1 tablet (600 mg total) by mouth 2 (two) times daily.  40 tablet  0  . guaiFENesin-dextromethorphan (ROBITUSSIN DM) 100-10 MG/5ML syrup Take 15 mLs by mouth every 4 (four) hours as needed for cough.  118 mL    . HYDROcodone-acetaminophen (NORCO) 7.5-325 MG per tablet Take 1 tablet by mouth every 8 (eight) hours as needed.  100 tablet  1  . Loperamide HCl (IMODIUM PO) Take by mouth as needed.        . metoprolol succinate (TOPROL-XL) 100 MG 24 hr tablet Take 1 tablet (100 mg total) by mouth 2 (two) times daily.   60 tablet  11  . mometasone-formoterol (DULERA) 200-5 MCG/ACT AERO Inhale 2 puffs into the lungs 2 (two) times daily.  1 Inhaler  0  . tadalafil (CIALIS) 20 MG tablet Take 20 mg by mouth daily as needed.         No current facility-administered medications for this visit.    Past Medical History  Diagnosis Date  . Ventricular hypertrophy   . Paroxysmal atrial fibrillation   . Hiatal hernia   . 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 Cholelithiasis   . Sleep apnea   . Nephrolithiasis   . Arthritis   . Hypertension   . Ankylosing spondylitis   . Atrial fibrillation   . GERD (gastroesophageal reflux disease)   . CAD (coronary artery disease) 2003    30% mid LAD lesion on cardiac cath    Past Surgical History  Procedure Laterality Date  . Cholecystectomy    . Cardiac catheterization  03/2001    30% mid LAD lesion otherwise normal cors, LVEF 65%  . Tonsillectomy      ROS:  As stated in the HPI and negative for all other systems.  PHYSICAL EXAM BP 112/78  Pulse 92  Ht 6' (1.829 m)  Wt 212 lb (96.163 kg)  BMI 28.75 kg/m2 GENERAL:  Well appearing HEENT:  Pupils equal round and reactive, fundi not visualized, oral mucosa unremarkable NECK:  No jugular venous  distention, waveform within normal limits, carotid upstroke brisk and symmetric, no bruits, no thyromegaly LYMPHATICS:  No cervical, inguinal adenopathy LUNGS:  Clear to auscultation bilaterally BACK:  No CVA tenderness CHEST:  Unremarkable HEART:  PMI not displaced or sustained,S1 and S2 within normal limits, no S3, no S4, no clicks, no rubs, no murmurs ABD:  Flat, positive bowel sounds normal in frequency in pitch, no bruits, no rebound, no guarding, no midline pulsatile mass, no hepatomegaly, no splenomegaly EXT:  2 plus pulses throughout, no edema, no cyanosis no clubbing SKIN:  No rashes no nodules NEURO:  Cranial nerves II through XII grossly intact, motor grossly intact throughout PSYCH:  Cognitively intact, oriented  to person place and time   ASSESSMENT AND PLAN  ATRIAL FIBRILLATION:  The patient has had now multiple bouts of recurrent atrial fibrillation in his life. He is asymptomatic. At this point I will control him with rate control and anticoagulation. I am going to apply a 24-hour Holter. He will continue the other meds as listed.  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 I will followup with repeat echoes. For now I am avoiding aces and ARB so I will likely titrate an ARB in the future. We have been avoiding this because of cough and pulmonary issues.  HTN:  This is being managed in the context of treating his CHF

## 2012-04-10 ENCOUNTER — Other Ambulatory Visit: Payer: Self-pay | Admitting: *Deleted

## 2012-04-10 MED ORDER — HYDROCODONE-ACETAMINOPHEN 7.5-325 MG PO TABS: 1.0000 | ORAL_TABLET | Freq: Three times a day (TID) | ORAL | Status: DC | PRN

## 2012-04-13 NOTE — Assessment & Plan Note (Signed)
Glad cough is much improved after they stopped lisinopril and started on Lasix Unclear why you're having residual cough To sort this out  - stop dulera  - Please use 3% hypertonic Neil med sinus nasal spray that you can get over-the-counter at [facility]  -  take generic fluticasone inhaler 2 squirts each nostril daily  -Please have full pulmonary function testing within the next 2-3 weeks (after being off dulera) and as soon as  finished it please call and let us know so I can review it and call you back about additional breathing test on the CT scan of the chest    #Followup - Based on results of pulmonary function testing which you need to have within the next 2-3 weeks at any location that is convenient to  you 

## 2012-04-18 ENCOUNTER — Ambulatory Visit: Payer: Medicare Other

## 2012-04-18 ENCOUNTER — Telehealth: Payer: Self-pay | Admitting: Internal Medicine

## 2012-04-18 DIAGNOSIS — R05 Cough: Secondary | ICD-10-CM

## 2012-04-18 NOTE — Telephone Encounter (Signed)
I called and spoke with Dr. Tanya Nones and gave him MR's cell phone #. He will give him a call. Will forward this to MR so he is aware.

## 2012-04-18 NOTE — Telephone Encounter (Signed)
I spoke with Son. He stated his wife's name is [name] Woods, DOB 03/15/1983, was seen over at [facility] by Dr. Tanya Nones. Phone # [phone]. Please advise MR thanks

## 2012-04-18 NOTE — Telephone Encounter (Signed)
According to the son. Patient has had a cough approximately 2 months ago and after that the daughter-in-law never cough 6 weeks ago. After that is a family never cough. According to the daughter-in-law is primary care physician Dr. Gailen Shelter he states that he saw daughter-in-law approximately one month ago with cough was given Z-Pak but due to persistence of cough he recently tested her for pertussis and today the result showed IgG and IgM positive. The son is keen that the patient get tested for pertussis. Therefore I have ordered the test.   However, I've explained to the son that even if this was whooping cough by the time I saw the patient he was asked to 4 week time point but I could treat with azithromycin. Moreover I have concerns of treating this patient with defibrillator and prior V. fib with a macrolide

## 2012-04-18 NOTE — Telephone Encounter (Signed)
Called, spoke with pt's son, Sharia Reeve, who states his wife, pt's daughter-in-law, just tested positive for whooping cough.  Josh states pt was the first who started having the coughing spells.  After he started with this, everyone else around him, including his daughter-in-law, starting having the same symptoms.  She has now tested positive for whooping cough.  Would like MR to be aware of this and would like to know if MR has any further recs.  Pls advise.  Thank you.

## 2012-04-18 NOTE — Telephone Encounter (Signed)
Please get hold of Dr Tanya Nones for me about the daughte rin law

## 2012-04-18 NOTE — Telephone Encounter (Signed)
lmomtcb  

## 2012-04-18 NOTE — Telephone Encounter (Signed)
Discussed with son  - he wants patient [name] Woods tested for pertussis. I am nervious to give empiric zithromax due to his V Fib. I hva orderedb blood test. Please facilitate  - Also, daughter in law and was positive fopr pertussis today. I need to know her name and dob  and the doctor and phone number who tested it I need to talk to the doctor to get that exxact detais of the test done.   Please send message back to me  Thanks  Dr. Kalman Shan, M.D., [facility].C.P Pulmonary and Critical Care Medicine Staff Physician [facility] [place] [facility] Pager: [phone], If no answer or between  15:00h - 7:00h: call [phone]  04/18/2012 3:15 PM

## 2012-04-21 ENCOUNTER — Ambulatory Visit (INDEPENDENT_AMBULATORY_CARE_PROVIDER_SITE_OTHER): Payer: Medicare Other

## 2012-04-21 DIAGNOSIS — I480 Paroxysmal atrial fibrillation: Secondary | ICD-10-CM

## 2012-04-21 DIAGNOSIS — I2510 Atherosclerotic heart disease of native coronary artery without angina pectoris: Secondary | ICD-10-CM | POA: Diagnosis not present

## 2012-04-21 NOTE — Progress Notes (Signed)
Placed patient on monitor and went over instructions on how to use it and when to return it

## 2012-04-22 ENCOUNTER — Telehealth: Payer: Self-pay | Admitting: Internal Medicine

## 2012-04-22 ENCOUNTER — Telehealth: Payer: Self-pay | Admitting: Cardiology

## 2012-04-22 LAB — BORDETELLA PERTUSSIS ANTIBODY
B pertussis IgA Ab, Quant: 3.9 U/mL — ABNORMAL HIGH (ref <=1.1)
B pertussis IgM Ab, Quant: 0.8 U/mL (ref <=1.1)

## 2012-04-22 NOTE — Telephone Encounter (Signed)
New Problem:    Patient called in because on of the leads for his monitor fell off last night and will not reapply to his skin.  Patient would like to know how to proceed.  Please call back.

## 2012-04-22 NOTE — Telephone Encounter (Signed)
New problem   Pt was returning [facility] phone call. Please call pt.

## 2012-04-22 NOTE — Telephone Encounter (Signed)
Will forward to Greta Doom to discuss with pt.

## 2012-04-22 NOTE — Telephone Encounter (Signed)
Let them know pertussis profile result is back. BAsed on my review he likely had "recent" pertussis infection which means he does not have it now but had it sometime 6 weeks ago to 6-12 months ago. So, it is possible that his cough when it started could have been due to pertussis but by the time I saw him he was not in a phase where zpak would help. IF he is very keen on trying zpak, I can check with his cardiologist and get back  Thanks  MR

## 2012-04-23 DIAGNOSIS — M1A00X1 Idiopathic chronic gout, unspecified site, with tophus (tophi): Secondary | ICD-10-CM | POA: Diagnosis not present

## 2012-04-23 LAB — BORDETELLA PERTUSSIS, IGG BY IB (RFLX): B pertussis, IgG: POSITIVE

## 2012-04-23 LAB — BORDETELLA PERTUSSIS, IGA BY IB: B pertussis, IgA: POSITIVE

## 2012-04-23 NOTE — Telephone Encounter (Signed)
Pt returned call. [name]  

## 2012-04-23 NOTE — Telephone Encounter (Signed)
lmomtcb x1 for pt. Will route to Jenn to f/u on as message was created by MR and triage has attempted to contact pt.

## 2012-04-23 NOTE — Telephone Encounter (Signed)
LMTCB

## 2012-04-23 NOTE — Telephone Encounter (Signed)
I spoke with pt. He stated his PCP gave him a ZPAK the 1st of feb and it didn't help. Per pt he is still coughing. He is scheduled for his breathing test. 04/29/12. Please advise MR thanks

## 2012-04-23 NOTE — Telephone Encounter (Signed)
No change in mgmt. Cough could be sequlae fropm pertussis whic could take weeks or months to go away. HE should follow all my instructiions and see as scheduled

## 2012-04-28 ENCOUNTER — Telehealth: Payer: Self-pay | Admitting: Cardiology

## 2012-04-28 NOTE — Telephone Encounter (Signed)
Walk In pt Form " [place]" Form needs Completed gave to [facility]  04/28/12/KM

## 2012-04-29 ENCOUNTER — Ambulatory Visit (INDEPENDENT_AMBULATORY_CARE_PROVIDER_SITE_OTHER): Payer: Medicare Other | Admitting: Internal Medicine

## 2012-04-29 DIAGNOSIS — R05 Cough: Secondary | ICD-10-CM | POA: Diagnosis not present

## 2012-04-29 DIAGNOSIS — R0609 Other forms of dyspnea: Secondary | ICD-10-CM

## 2012-04-29 DIAGNOSIS — R0989 Other specified symptoms and signs involving the circulatory and respiratory systems: Secondary | ICD-10-CM | POA: Diagnosis not present

## 2012-04-29 LAB — PULMONARY FUNCTION TEST

## 2012-04-29 NOTE — Progress Notes (Signed)
PFT done today. 

## 2012-04-29 NOTE — Telephone Encounter (Signed)
Patient just need to bring monitor back to office Thanks

## 2012-05-02 ENCOUNTER — Telehealth: Payer: Self-pay | Admitting: Cardiology

## 2012-05-02 NOTE — Telephone Encounter (Signed)
New Problem:    Called in because the patient has been restless, sweating profusely and complaining of a pain in his neck for the past two nights.  Patient's wife stated that he was so warm he felt like he was running a fever yesterday.  Please call back.

## 2012-05-02 NOTE — Telephone Encounter (Signed)
Per pts wife - pt has been having an increase in cough, sweating at night and very restless.  Having neck pain as well.  Rheumatologist just started him on Uloric.  Advised no side effects listed like the symptoms he is having.  Wife doesn't know if he is running a fever but they had to change his pajamas last night because he was sweating so much.   Advised wife to call PCP for further evaluation.

## 2012-05-05 ENCOUNTER — Telehealth: Payer: Self-pay | Admitting: Cardiology

## 2012-05-05 NOTE — Telephone Encounter (Signed)
Pt Aware [employer] paper Ready For Pick up 05/05/12/KM

## 2012-05-06 DIAGNOSIS — M1A00X1 Idiopathic chronic gout, unspecified site, with tophus (tophi): Secondary | ICD-10-CM | POA: Diagnosis not present

## 2012-05-06 DIAGNOSIS — M25529 Pain in unspecified elbow: Secondary | ICD-10-CM | POA: Diagnosis not present

## 2012-05-11 ENCOUNTER — Telehealth: Payer: Self-pay | Admitting: Internal Medicine

## 2012-05-11 DIAGNOSIS — R05 Cough: Secondary | ICD-10-CM

## 2012-05-11 NOTE — Telephone Encounter (Signed)
Pulmonary function test 04/29/2012 is completely normal. Please ask him if his cough is still present and if it is this is worse after stopping Dulera? If it still is please tell them that I recommend  CT scan of the chest without contrast. Further testing versus followup depending on the results  Continue to hold off [facility]   Dr. Kalman Shan, M.D., [facility].C.P Pulmonary and Critical Care Medicine Staff Physician  System [place] [facility] Pager: [phone], If no answer or between  15:00h - 7:00h: call [phone]  05/11/2012 2:09 PM     For my use - 04/29/2012: FVC 4.1 L/95%. FEV1 2.7 L/95%. No bronchial dilator response. Ratio 67. Total lung capacity 8 L/224%. Residual volume 3.9 L/152%. DLCO 22.4/97%. Essentially normal PFTs except for air trapping and hyperinflation which might be normal all represent early asthma

## 2012-05-14 NOTE — Telephone Encounter (Signed)
[email or web address].Gaetana Kawahara, CMA  

## 2012-05-14 NOTE — Telephone Encounter (Signed)
Spoke with the pt and he states that his cough is still present. He states he does not feel it is worse off he dulera but it is not any better. Pt wants to proceed with the CT. Order placed.  Carron Curie, CMA

## 2012-05-19 ENCOUNTER — Encounter: Payer: Self-pay | Admitting: Family Medicine

## 2012-05-19 ENCOUNTER — Ambulatory Visit (INDEPENDENT_AMBULATORY_CARE_PROVIDER_SITE_OTHER)
Admission: RE | Admit: 2012-05-19 | Discharge: 2012-05-19 | Disposition: A | Discharge status: Home / Self Care | Payer: Medicare Other | Source: Ambulatory Visit | Attending: Internal Medicine | Admitting: Internal Medicine

## 2012-05-19 ENCOUNTER — Telehealth: Payer: Self-pay | Admitting: Cardiology

## 2012-05-19 ENCOUNTER — Ambulatory Visit (INDEPENDENT_AMBULATORY_CARE_PROVIDER_SITE_OTHER): Payer: Medicare Other | Admitting: Family Medicine

## 2012-05-19 VITALS — BP 132/84 | HR 88 | Temp 97.3°F | Wt 212.0 lb

## 2012-05-19 DIAGNOSIS — I509 Heart failure, unspecified: Secondary | ICD-10-CM | POA: Diagnosis not present

## 2012-05-19 DIAGNOSIS — R05 Cough: Secondary | ICD-10-CM

## 2012-05-19 DIAGNOSIS — I2510 Atherosclerotic heart disease of native coronary artery without angina pectoris: Secondary | ICD-10-CM | POA: Diagnosis not present

## 2012-05-19 DIAGNOSIS — I517 Cardiomegaly: Secondary | ICD-10-CM

## 2012-05-19 DIAGNOSIS — I4891 Unspecified atrial fibrillation: Secondary | ICD-10-CM | POA: Diagnosis not present

## 2012-05-19 DIAGNOSIS — N529 Male erectile dysfunction, unspecified: Secondary | ICD-10-CM

## 2012-05-19 DIAGNOSIS — I480 Paroxysmal atrial fibrillation: Secondary | ICD-10-CM

## 2012-05-19 DIAGNOSIS — K219 Gastro-esophageal reflux disease without esophagitis: Secondary | ICD-10-CM

## 2012-05-19 DIAGNOSIS — M25569 Pain in unspecified knee: Secondary | ICD-10-CM | POA: Diagnosis not present

## 2012-05-19 DIAGNOSIS — M25562 Pain in left knee: Secondary | ICD-10-CM

## 2012-05-19 DIAGNOSIS — I5032 Chronic diastolic (congestive) heart failure: Secondary | ICD-10-CM

## 2012-05-19 DIAGNOSIS — J984 Other disorders of lung: Secondary | ICD-10-CM | POA: Diagnosis not present

## 2012-05-19 MED ORDER — HYDROCODONE-ACETAMINOPHEN 7.5-325 MG PO TABS: 1.0000 | ORAL_TABLET | Freq: Three times a day (TID) | ORAL | Status: DC | PRN

## 2012-05-19 MED ORDER — PREDNISONE 20 MG PO TABS: 10.0000 mg | ORAL_TABLET | Freq: Every day | ORAL | Status: DC

## 2012-05-19 MED ORDER — TADALAFIL 5 MG PO TABS: 5.0000 mg | ORAL_TABLET | Freq: Every day | ORAL | Status: DC | PRN

## 2012-05-19 NOTE — Patient Instructions (Signed)
Continue current medications  Followup with me when necessary

## 2012-05-19 NOTE — Progress Notes (Signed)
  Subjective:    Patient ID: [name] Woods, male    DOB: 28-Dec-1940, 72 y.o.   MRN: [id number]  HPI  [name] Woods is a 29 male who comes in today for a three-week sooner for problems  He was hospitalized in February for evaluation of shortness of breath. He was admitted on the 21st and discharged on the 25th. He was found to be in atrial fib with mild CHF. He's on a oral blood thinner, diltiazem 180 mg daily and Toprol 100 mg twice a day all for rate control. He did have an ejection fraction of 40% and some nonobstructive coronary lesions. Dr. Davonna Belling did not feel his symptoms were related to his heart. He's had a complete pulmonary evaluation. Today he had a CT scan of his chest. He's on prednisone 10 mg daily. The inhaler was discontinued.  He continues to have sleep ear pain in his knee he does not want to have a total knee replacement he's currently taking the pain pills 13 times daily  He's also requesting a refill of the Cialis  Review of Systems    review of systems otherwise negative Objective:   Physical Exam Well-developed well-nourished male no acute distress lungs are clear to auscultation cardiac exam shows a rate of 80 irregularly irregular no murmurs       Assessment & Plan:  Atrial fib,,,,,,,,,,,, stable continue current medications followup by Dr. Leta Jungling  Asthma,,,,,,,,, continue prednisone 10 mg daily for now followup in pulmonary  Chronic knee pain,,,,,,,,,,,,, Vicodin 3 times a day when necessary  Erectile dysfunction Cialis when necessary

## 2012-05-19 NOTE — Telephone Encounter (Signed)
[employer] papers Picked Up 05/19/12/KM

## 2012-05-20 ENCOUNTER — Telehealth: Payer: Self-pay | Admitting: *Deleted

## 2012-05-20 LAB — BRAIN NATRIURETIC PEPTIDE: Pro B Natriuretic peptide (BNP): 99 pg/mL (ref 0.0–100.0)

## 2012-05-20 NOTE — Progress Notes (Signed)
Pt informed

## 2012-05-20 NOTE — Telephone Encounter (Signed)
Pt informed

## 2012-05-21 ENCOUNTER — Encounter: Payer: Self-pay | Admitting: Gastroenterology

## 2012-05-22 ENCOUNTER — Telehealth: Payer: Self-pay | Admitting: Internal Medicine

## 2012-05-22 ENCOUNTER — Telehealth: Payer: Self-pay | Admitting: Cardiology

## 2012-05-22 ENCOUNTER — Telehealth: Payer: Self-pay

## 2012-05-22 NOTE — Telephone Encounter (Signed)
Follow Up     Pt calling returning phone call from earlier. Please call.

## 2012-05-22 NOTE — Telephone Encounter (Signed)
CT chest 05/19/2012 is basically normal. Some 4  millimeter nodule seen but this requires followup in one year does not explain the cause of cough. Please give him an appointment to come and see me for further evaluation of the cough

## 2012-05-22 NOTE — Telephone Encounter (Signed)
lmtco

## 2012-05-26 NOTE — Telephone Encounter (Signed)
Pt aware of results and appt set. Carron Curie, CMA

## 2012-06-04 DIAGNOSIS — M1A00X1 Idiopathic chronic gout, unspecified site, with tophus (tophi): Secondary | ICD-10-CM | POA: Diagnosis not present

## 2012-06-05 ENCOUNTER — Telehealth: Payer: Self-pay

## 2012-06-05 ENCOUNTER — Encounter: Payer: Self-pay | Admitting: Internal Medicine

## 2012-06-05 NOTE — Telephone Encounter (Signed)
Patient aware of monitor results 

## 2012-06-10 ENCOUNTER — Encounter: Payer: Self-pay | Admitting: Internal Medicine

## 2012-06-10 ENCOUNTER — Ambulatory Visit (INDEPENDENT_AMBULATORY_CARE_PROVIDER_SITE_OTHER): Payer: Medicare Other | Admitting: Internal Medicine

## 2012-06-10 VITALS — BP 124/78 | HR 90 | Temp 97.6°F | Ht 72.0 in | Wt 213.0 lb

## 2012-06-10 DIAGNOSIS — R05 Cough: Secondary | ICD-10-CM | POA: Diagnosis not present

## 2012-06-10 DIAGNOSIS — R059 Cough, unspecified: Secondary | ICD-10-CM

## 2012-06-10 DIAGNOSIS — J387 Other diseases of larynx: Secondary | ICD-10-CM | POA: Diagnosis not present

## 2012-06-10 MED ORDER — FLUTICASONE PROPIONATE 50 MCG/ACT NA SUSP: 2.0000 | Freq: Every day | NASAL | Status: DC

## 2012-06-10 MED ORDER — GABAPENTIN 300 MG PO CAPS: ORAL_CAPSULE | ORAL | Status: DC

## 2012-06-10 NOTE — Progress Notes (Signed)
Subjective:    Patient ID: [name] Woods, male    DOB: 05-Apr-1940, 72 y.o.   MRN: [id number]  HPI PCP is TODD,JEFFREY ALLEN, MD REferred for cough   OV 04/07/2012  He  date distinct increase/ new cough x 6 weeks. He may have had a non-febrile cold near the onset, but no purulent sputum. He did cut holes in a new polyurethane sealed floor w/o respiratory protection at that time. Cough began dry but progressed to productive of thick clear, choking mucus. Worse at night- wakes him 2-3x. Tried wife's asthma inhaler once- helped.   Then admitted with dyspnea as well 03/21/12 to 03/25/12- Noted to be in worseing of preexistent A Fib but with new low EF with echo ef 45% but RHC and LHC normal. ACE inhibitor stopped.  Also, Rx with lasix  (new lasix just prior to the amdission) Lopressor increaesd. There was pulm consult at this time.  Since then cough better 70%.  Currently cough is moderate. Cough currently made worse by answering phone, sleeping/lying down. HE feels theere is mucus in back of throat. Feels tickle in throat with some mild sinus drainage. Cough ? improved by inhalers ; currently on dulera    He reports dx of obstructive sleep apnea by [facility]?) but never treated. Family confirms loud snore    Sinus - mild sinus drainage currently  Allergy - denies spirng allpergies  BP - ACE inhibitor stopped feb 2014; currently in lopresso  GI/GERD Denies GERD Body mass index is 29.18 kg/(m^2).  Pulmonary 06/21/08 - CT abd lung cut - some non specific fibrotic stranding in bases CXR Feb 2014 - no acute proicess  EXposure  reports that he has never smoked. He has never used smokeless tobacco. 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 Denies mold inhouse.   REC Glad cough is much improved after they stopped lisinopril and started on Lasix  Unclear why you're having residual cough  To sort this out  - stop dulera  - Please use 3% hypertonic Lloyd Huger med sinus nasal spray that  you can get over-the-counter at [name] or [facility] to [employer]  - take generic fluticasone inhaler 2 squirts each nostril daily  -Please have full pulmonary function testing within the next 2-3 weeks (after being off dulera) and as soon as finished it please call and let us know so I can review it and call you back about additional breathing test on the CT scan of the chest  #Followup  - Based on results of pulmonary function testing which you need to have within the next 2-3 weeks at any location that is convenient to you   Telephone call 04/18/2012 According to the son. Patient has had a cough approximately 2 months ago and after that the daughter-in-law never cough 6 weeks ago. After that is a family never cough. According to the daughter-in-law is primary care physician Dr. Gailen Shelter he states that he saw daughter-in-law approximately one month ago with cough was given Z-Pak but due to persistence of cough he recently tested her for pertussis and today the result showed IgG and IgM positive. The son is keen that the patient get tested for pertussis. Therefore I have ordered the test.    TElephone call 04/22/12 - pertussis  B Ased on my review of laboratory results he likely had "recent" pertussis infection which means he does not have it now but had it sometime 6 weeks ago to 6-12 months ago. So, it is possible that his cough  when it started could have been due to pertussis but by the time I saw him he was not in a phase where zpak would help. I   LABS in April 2014 CT chest 05/19/2012 is basically normal. Some 4  millimeter nodule seen but this requires followup in one year does not explain the cause of cough.   Pulmonary function test 04/29/2012 is completely normal.    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  He reports that his cough is one third better. In fact his RSI cough score is 23 compared to 32 at prior visit. This  correlates with one third improvement. However cough is still at a level where it is bothersome. He thinks it's the passage of time has helped with improvement of cough. He is not using his nasal steroid oh saline nasal spray as advised. However he continues to be office inhaled corticosteroids. For the details of cough on and the table below  Past, Family, Social reviewed: no change since last visit    Dr Gretta Cool Reflux Symptom Index (> 13-15 suggestive of LPR cough) 0 -> 5  =  none ->severe problem 04/07/2012  06/10/2012 1/3rd better only  Hoarseness of problem with voice 5 5  Clearing  Of Throat 3 5  Excess throat mucus or feeling of post nasal drip 3 3  Difficulty swallowing food, liquid or tablets 3 0  Cough after eating or lying down 3 3  Breathing difficulties or choking episodes 5 0  Troublesome or annoying cough 5 2  Sensation of something sticking in throat or lump in throat 5 5  Heartburn, chest pain, indigestion, or stomach acid coming up 0 0  TOTAL 32 23  RX intervention Nasal steroid, saline spray and time Rx       Kouffman Reflux v Neurogenic Cough Differentiator Reflux 06/10/2012   Do you awaken from a sound sleep coughing violently?                            With trouble breathing? Yes n  Do you have choking episodes when you cannot  Get enough air, gasping for air ?              Yes n  Do you usually cough when you lie down into  The bed, or when you just lie down to rest ?                          no n  Do you usually cough after meals or eating?         no y  Do you cough when (or after) you bend over?    no y  GERD SCORE  2 2  Kouffman Reflux v Neurogenic Cough Differentiator Neurogenic   Do you more-or-less cough all day long? y n  Does change of temperature make you cough? n n  Does laughing or chuckling cause you to cough? n y  Do fumes (perfume, automobile fumes, burned  Toast, etc.,) cause you to cough ?      n n  Does speaking, singing, or talking on  the phone cause you to cough   ?               y y  Neurogenic/Airway score 2 2          Review of Systems  Constitutional: Negative for fever and unexpected weight change.  HENT: Negative for ear pain, nosebleeds, congestion, sore throat, rhinorrhea, sneezing, trouble swallowing, dental problem, postnasal drip and sinus pressure.   Eyes: Negative for redness and itching.  Respiratory: Positive for cough. Negative for chest tightness, shortness of breath and wheezing.   Cardiovascular: Negative for palpitations and leg swelling.  Gastrointestinal: Negative for nausea and vomiting.  Genitourinary: Negative for dysuria.  Musculoskeletal: Negative for joint swelling.  Skin: Negative for rash.  Neurological: Negative for headaches.  Hematological: Does not bruise/bleed easily.  Psychiatric/Behavioral: Negative for dysphoric mood. The patient is not nervous/anxious.        Objective:   Physical Exam Nursing note and vitals reviewed. Constitutional: He is oriented to person, place, and time. He appears well-developed and well-nourished. No distress.  HENT:  Head: Normocephalic and atraumatic.  Right Ear: External ear normal.  Left Ear: External ear normal.  Mouth/Throat: Oropharynx is clear and moist. No oropharyngeal exudate.  Mild post nasal drip present  Eyes: Conjunctivae and EOM are normal. Pupils are equal, round, and reactive to light. Right eye exhibits no discharge. Left eye exhibits no discharge. No scleral icterus.  Neck: Normal range of motion. Neck supple. No JVD present. No tracheal deviation present. No thyromegaly present.  Cardiovascular: Normal rate, regular rhythm and intact distal pulses.  Exam reveals no gallop and no friction rub.   No murmur heard. Pulmonary/Chest: Effort normal and breath sounds normal. No respiratory distress. He has no wheezes. He has no rales. He exhibits no tenderness.  Abdominal: Soft. Bowel sounds are normal. He exhibits no distension  and no mass. There is no tenderness. There is no rebound and no guarding.  Visceral obesity present    Musculoskeletal: Normal range of motion. He exhibits no edema and no tenderness.  Lymphadenopathy:    He has no cervical adenopathy.  Neurological: He is alert and oriented to person, place, and time. He has normal reflexes. No cranial nerve deficit. Coordination normal.  Skin: Skin is warm and dry. No rash noted. He is not diaphoretic. No erythema. No pallor.  Psychiatric: He has a normal mood and affect. His behavior is normal. Judgment and thought content normal.         Assessment & Plan:

## 2012-06-10 NOTE — Assessment & Plan Note (Signed)
Cough is from sinus drainage, possinble acid reflux, and pos pertussis All of this is working together to cause cyclical cough/LPR cough 100% compliance with below is very important  #Sinus drainage  -RE- start 3% hypertonic nasal saline spray (OTC) at night 2 squirts -  RE-tart nasal steroid generic fluticasone inhaler 2 squirts each nostril daily as advised    #Possible Acid Reflux  - take otc zegerid 20mg   1 capsule daily on empty stomach    - take low glycemic diet sheet from [place] - stick to foods in left lane  - At all times avoid colas, spices, cheeses, spirits, red meats, beer, chocolates, fried foods etc.,   - sleep with head end of bed elevated  - eat small frequent meals  - do not go to bed for 3 hours after last meal     #Cyclical cough/Irritable Larynx  - please choose 2-3 days and observe complete voice rest - no talking or whispering  - at all times there  there is urge to cough, drink water or swallow or sip on throat lozenge - see Mr Verdie Mosher speech therapist - Take gabapentin 300mg  once daily x 3 days, then 300mg  twice daily x 3 days, then 300mg  three times daily to continue. If this makes you too sleepy or drowsy call us and we will cut your medication dosing down    #Followup - I will see you in 4 weeks with cough score at followup - any problems call or come sooner  > 50% of this > 25 min visit spent in face to face counseling (15 min visit converted to 25 min)

## 2012-06-10 NOTE — Patient Instructions (Addendum)
Cough is from sinus drainage, possinble acid reflux, and pos pertussis All of this is working together to cause cyclical cough/LPR cough 100% compliance with below is very important  #Sinus drainage  -RE- start 3% hypertonic nasal saline spray (OTC) at night 2 squirts -  RE-tart nasal steroid generic fluticasone inhaler 2 squirts each nostril daily as advised    #Possible Acid Reflux  - take otc zegerid 20mg   1 capsule daily on empty stomach    - take low glycemic diet sheet from [place] - stick to foods in left lane  - At all times avoid colas, spices, cheeses, spirits, red meats, beer, chocolates, fried foods etc.,   - sleep with head end of bed elevated  - eat small frequent meals  - do not go to bed for 3 hours after last meal     #Cyclical cough/Irritable Larynx  - please choose 2-3 days and observe complete voice rest - no talking or whispering  - at all times there  there is urge to cough, drink water or swallow or sip on throat lozenge - see Mr Verdie Mosher speech therapist - Take gabapentin 300mg  once daily x 3 days, then 300mg  twice daily x 3 days, then 300mg  three times daily to continue. If this makes you too sleepy or drowsy call us and we will cut your medication dosing down    #Followup - I will see you in 4 weeks with cough score at followup - any problems call or come sooner

## 2012-06-20 ENCOUNTER — Ambulatory Visit: Payer: Medicare Other

## 2012-06-30 ENCOUNTER — Telehealth: Payer: Self-pay | Admitting: Cardiology

## 2012-06-30 NOTE — Telephone Encounter (Signed)
Pt aware no samples available for either medication.  Card for 30 day trial left at the front desk for Eliquis along with information on the patient assistance program

## 2012-06-30 NOTE — Telephone Encounter (Signed)
New Problem:    Patient called in requesting samples of apixaban (ELIQUIS) 5 MG TABS tablet and tadalafil (CIALIS) 5 MG tablet.  Please call back.

## 2012-07-28 DIAGNOSIS — M109 Gout, unspecified: Secondary | ICD-10-CM | POA: Diagnosis not present

## 2012-07-28 DIAGNOSIS — M25529 Pain in unspecified elbow: Secondary | ICD-10-CM | POA: Diagnosis not present

## 2012-08-16 ENCOUNTER — Telehealth: Payer: Self-pay | Admitting: Cardiology

## 2012-08-16 ENCOUNTER — Other Ambulatory Visit: Payer: Self-pay | Admitting: Cardiology

## 2012-08-16 DIAGNOSIS — I10 Essential (primary) hypertension: Secondary | ICD-10-CM

## 2012-08-16 MED ORDER — AMLODIPINE BESYLATE 5 MG PO TABS: 5.0000 mg | ORAL_TABLET | Freq: Every day | ORAL | Status: DC

## 2012-08-16 NOTE — Telephone Encounter (Signed)
Mrs. Gleaves called on behalf of her husband [name] Woods to report he has had elevated BP since last evening. BP readings at home range 161/103 - 170/105 (pulse 90s - known AF). He has had a headache since last evening but has not taken any medication for treatment. He continues to have headache this AM but not severe. He has no other symptoms. He specifically denies CP, SOB, palpitations, dizziness, near syncope or syncope. He denies LE or abdominal swelling. He denies orthopnea or PND. He is complaint and has already taken his AM meds.   Discussed care with MD on call, Dr. Purvis Sheffield, who reviewed patient's chart with me. Dr. Purvis Sheffield recommended he start amlodipine 5 mg once daily. He was instructed to take Tylenol for his headache. He will recheck his BP 2 hours after taking amlodipine and call us back with an update. He was instructed to seek emergent care should he develop CP, SOB, swelling, dizziness, syncope or difficult/altered mentation. He and his wife expressed verbal understanding and agree with this plan of care.

## 2012-08-27 DIAGNOSIS — M1A00X1 Idiopathic chronic gout, unspecified site, with tophus (tophi): Secondary | ICD-10-CM | POA: Diagnosis not present

## 2012-09-03 ENCOUNTER — Other Ambulatory Visit: Payer: Self-pay

## 2012-10-17 ENCOUNTER — Other Ambulatory Visit: Payer: Self-pay | Admitting: *Deleted

## 2012-10-17 DIAGNOSIS — M25562 Pain in left knee: Secondary | ICD-10-CM

## 2012-10-17 MED ORDER — HYDROCODONE-ACETAMINOPHEN 7.5-325 MG PO TABS: 1.0000 | ORAL_TABLET | Freq: Three times a day (TID) | ORAL | Status: DC | PRN

## 2012-11-18 ENCOUNTER — Telehealth: Payer: Self-pay | Admitting: Family Medicine

## 2012-11-18 DIAGNOSIS — Z0283 Encounter for blood-alcohol and blood-drug test: Secondary | ICD-10-CM

## 2012-11-18 NOTE — Telephone Encounter (Signed)
Pt need new rx hydrocodone °

## 2012-11-18 NOTE — Telephone Encounter (Signed)
Spoke with patient and explained that he would need an urine drug screening for the Hydrocodone refill

## 2012-11-27 ENCOUNTER — Other Ambulatory Visit: Payer: Medicare Other

## 2012-11-27 DIAGNOSIS — Z0283 Encounter for blood-alcohol and blood-drug test: Secondary | ICD-10-CM

## 2012-11-27 DIAGNOSIS — Z0189 Encounter for other specified special examinations: Secondary | ICD-10-CM | POA: Diagnosis not present

## 2012-11-28 LAB — DRUG SCREEN, URINE
Amphetamine Screen, Ur: NEGATIVE
Barbiturate Quant, Ur: NEGATIVE
Marijuana Metabolite: NEGATIVE
Methadone: NEGATIVE
Opiates: POSITIVE — AB

## 2012-12-01 ENCOUNTER — Telehealth: Payer: Self-pay | Admitting: Family Medicine

## 2012-12-01 DIAGNOSIS — M25562 Pain in left knee: Secondary | ICD-10-CM

## 2012-12-01 DIAGNOSIS — M1A00X1 Idiopathic chronic gout, unspecified site, with tophus (tophi): Secondary | ICD-10-CM | POA: Diagnosis not present

## 2012-12-01 MED ORDER — HYDROCODONE-ACETAMINOPHEN 7.5-325 MG PO TABS: 1.0000 | ORAL_TABLET | Freq: Three times a day (TID) | ORAL | Status: DC | PRN

## 2012-12-01 NOTE — Telephone Encounter (Signed)
Rx ready for pick up. 

## 2012-12-01 NOTE — Telephone Encounter (Signed)
Okay to refill medication for one month please,,,,,,,, also explained to all our patients to new rules

## 2012-12-02 ENCOUNTER — Ambulatory Visit (INDEPENDENT_AMBULATORY_CARE_PROVIDER_SITE_OTHER): Payer: Medicare Other

## 2012-12-02 DIAGNOSIS — Z23 Encounter for immunization: Secondary | ICD-10-CM

## 2012-12-23 DIAGNOSIS — M2550 Pain in unspecified joint: Secondary | ICD-10-CM | POA: Diagnosis not present

## 2012-12-23 DIAGNOSIS — M1A00X1 Idiopathic chronic gout, unspecified site, with tophus (tophi): Secondary | ICD-10-CM | POA: Diagnosis not present

## 2012-12-27 ENCOUNTER — Encounter: Payer: Self-pay | Admitting: Family Medicine

## 2012-12-27 ENCOUNTER — Ambulatory Visit (INDEPENDENT_AMBULATORY_CARE_PROVIDER_SITE_OTHER): Payer: Medicare Other | Admitting: Family Medicine

## 2012-12-27 VITALS — BP 118/72 | HR 105 | Temp 97.5°F | Wt 205.0 lb

## 2012-12-27 DIAGNOSIS — J019 Acute sinusitis, unspecified: Secondary | ICD-10-CM | POA: Diagnosis not present

## 2012-12-27 MED ORDER — AMOXICILLIN-POT CLAVULANATE 875-125 MG PO TABS: 1.0000 | ORAL_TABLET | Freq: Two times a day (BID) | ORAL | Status: DC

## 2012-12-27 NOTE — Progress Notes (Signed)
On pred at baseline per patient for gout and on eliquis for AF.  Taking all meds at baseline except for stopping eliquis yesterday.    Sx started about 1 week ago.  URI sx, cough/fatigue/head congestion.  Gradually got worse as the week went on.  Nosebleed started yesterday, more as the day was going on.  No fevers, no vomiting, no rash.  Some sputum, clear unless red from nose bleed.  Not on aspirin.    Some nosebleed last night, on his pillow.  Used an ice pack this AM and it improved.    Meds, vitals, and allergies reviewed.   ROS: See HPI.  Otherwise, noncontributory.  GEN: nad, alert and oriented HEENT: mucous membranes moist, tm w/o erythema, dried blood in the R nostril, no active bleeding, no crusted blood in L nostril, OP with cobblestoning, R max sinus ttp NECK: supple w/o LA CV: IRR, not tachy PULM: ctab, no inc wob EXT: no edema SKIN: no acute rash

## 2012-12-27 NOTE — Assessment & Plan Note (Signed)
Given the duration and the unilateral pain, would start augmentin.  D/w pt.  He agrees.  See instructions re: nosebleed.  No active bleeding, hold eliquis for now.  We didn't need to pack his nostril today.

## 2012-12-27 NOTE — Patient Instructions (Signed)
Stay off the eliquis for now.  If you have another bleed, then pinch your nose and lean forward for 5 minutes.  Repeat if needed. If still bleeding, then you'll likely need to have your nose packed by a doctor.  Avoid nose blowing in the meantime. Call your primary doctor on Monday about follow up and the eliquis.  Start the antibiotics in the meantime.  Take care.

## 2012-12-31 ENCOUNTER — Telehealth: Payer: Self-pay | Admitting: Family Medicine

## 2012-12-31 DIAGNOSIS — M25562 Pain in left knee: Secondary | ICD-10-CM

## 2012-12-31 NOTE — Telephone Encounter (Signed)
Pt needs new rx hydrocodone °

## 2012-12-31 NOTE — Telephone Encounter (Signed)
Okay to fill? 

## 2013-01-02 NOTE — Telephone Encounter (Signed)
Pt following up on refill request. Pt is out of meds.

## 2013-01-05 NOTE — Telephone Encounter (Signed)
Pt following up again on rx. pls advise.

## 2013-01-06 MED ORDER — HYDROCODONE-ACETAMINOPHEN 7.5-325 MG PO TABS: 1.0000 | ORAL_TABLET | Freq: Three times a day (TID) | ORAL | Status: DC | PRN

## 2013-01-06 NOTE — Telephone Encounter (Signed)
ok 

## 2013-01-06 NOTE — Telephone Encounter (Signed)
Rx ready for pick up and patient is aware 

## 2013-02-02 ENCOUNTER — Telehealth: Payer: Self-pay | Admitting: Family Medicine

## 2013-02-02 DIAGNOSIS — M25562 Pain in left knee: Secondary | ICD-10-CM

## 2013-02-02 NOTE — Telephone Encounter (Signed)
Okay 

## 2013-02-02 NOTE — Telephone Encounter (Signed)
Patient called requesting a refill on HYDROcodone-acetaminophen (NORCO) 7.5-325 MG per tablet °Please advise °

## 2013-02-03 MED ORDER — HYDROCODONE-ACETAMINOPHEN 7.5-325 MG PO TABS: 1.0000 | ORAL_TABLET | Freq: Three times a day (TID) | ORAL | Status: DC | PRN

## 2013-02-03 NOTE — Telephone Encounter (Signed)
Rx ready for pick up and Left message on machine for patient   

## 2013-03-05 ENCOUNTER — Telehealth: Payer: Self-pay | Admitting: Family Medicine

## 2013-03-05 MED ORDER — HYDROCODONE-ACETAMINOPHEN 7.5-325 MG PO TABS: 1.0000 | ORAL_TABLET | Freq: Three times a day (TID) | ORAL | Status: DC | PRN

## 2013-03-05 NOTE — Telephone Encounter (Signed)
Pt is requesting rx HYDROcodone-acetaminophen (NORCO) 7.5-325 MG per tablet, please call to advise when to pick up

## 2013-03-06 NOTE — Telephone Encounter (Signed)
Rx ready for pick up and patient is aware 

## 2013-03-31 DIAGNOSIS — M1A00X1 Idiopathic chronic gout, unspecified site, with tophus (tophi): Secondary | ICD-10-CM | POA: Diagnosis not present

## 2013-04-06 ENCOUNTER — Telehealth: Payer: Self-pay | Admitting: Family Medicine

## 2013-04-06 MED ORDER — HYDROCODONE-ACETAMINOPHEN 7.5-325 MG PO TABS: 1.0000 | ORAL_TABLET | Freq: Three times a day (TID) | ORAL | Status: DC | PRN

## 2013-04-06 NOTE — Telephone Encounter (Signed)
Rx ready for pick up and patient is aware 

## 2013-04-06 NOTE — Telephone Encounter (Signed)
Pt need re-fill of HYDROcodone-acetaminophen (NORCO) 7.5-325 MG per tablet

## 2013-04-16 ENCOUNTER — Telehealth: Payer: Self-pay

## 2013-04-16 NOTE — Telephone Encounter (Signed)
Patient call to get samples of eliquis place samples up front

## 2013-04-18 ENCOUNTER — Other Ambulatory Visit: Payer: Self-pay | Admitting: Cardiology

## 2013-04-22 ENCOUNTER — Other Ambulatory Visit: Payer: Self-pay | Admitting: *Deleted

## 2013-04-22 MED ORDER — FUROSEMIDE 20 MG PO TABS: 20.0000 mg | ORAL_TABLET | Freq: Every day | ORAL | Status: DC

## 2013-04-24 ENCOUNTER — Other Ambulatory Visit: Payer: Self-pay | Admitting: Cardiology

## 2013-04-24 ENCOUNTER — Other Ambulatory Visit: Payer: Self-pay

## 2013-04-24 MED ORDER — FUROSEMIDE 20 MG PO TABS: 20.0000 mg | ORAL_TABLET | Freq: Every day | ORAL | Status: DC

## 2013-04-24 MED ORDER — METOPROLOL SUCCINATE ER 100 MG PO TB24: 100.0000 mg | ORAL_TABLET | Freq: Two times a day (BID) | ORAL | Status: DC

## 2013-05-05 ENCOUNTER — Telehealth: Payer: Self-pay | Admitting: Family Medicine

## 2013-05-05 NOTE — Telephone Encounter (Signed)
Pt is needing new rx HYDROcodone-acetaminophen (NORCO) 7.5-325 MG per tablet, please call when available for pick up. ° °

## 2013-05-06 MED ORDER — HYDROCODONE-ACETAMINOPHEN 7.5-325 MG PO TABS: 1.0000 | ORAL_TABLET | Freq: Three times a day (TID) | ORAL | Status: DC | PRN

## 2013-05-06 NOTE — Telephone Encounter (Signed)
Rx ready for pick up and patient is aware 

## 2013-05-15 ENCOUNTER — Telehealth: Payer: Self-pay | Admitting: *Deleted

## 2013-05-15 NOTE — Telephone Encounter (Signed)
Patient requests eliquis samples. I will place at the front desk for pick up. 

## 2013-06-04 ENCOUNTER — Telehealth: Payer: Self-pay | Admitting: Family Medicine

## 2013-06-04 NOTE — Telephone Encounter (Signed)
Pt request HYDROcodone-acetaminophen (NORCO) 7.5-325 MG per tablet

## 2013-06-05 MED ORDER — HYDROCODONE-ACETAMINOPHEN 7.5-325 MG PO TABS: 1.0000 | ORAL_TABLET | Freq: Three times a day (TID) | ORAL | Status: DC | PRN

## 2013-06-05 NOTE — Telephone Encounter (Signed)
Rx ready for pick up.  Left message on machine for patient. 

## 2013-06-11 ENCOUNTER — Encounter: Payer: Self-pay | Admitting: Cardiology

## 2013-06-11 ENCOUNTER — Ambulatory Visit (INDEPENDENT_AMBULATORY_CARE_PROVIDER_SITE_OTHER): Payer: Medicare Other | Admitting: Cardiology

## 2013-06-11 VITALS — BP 122/72 | HR 74 | Ht 73.0 in | Wt 220.4 lb

## 2013-06-11 DIAGNOSIS — I517 Cardiomegaly: Secondary | ICD-10-CM | POA: Diagnosis not present

## 2013-06-11 DIAGNOSIS — I4891 Unspecified atrial fibrillation: Secondary | ICD-10-CM

## 2013-06-11 DIAGNOSIS — I10 Essential (primary) hypertension: Secondary | ICD-10-CM

## 2013-06-11 DIAGNOSIS — I480 Paroxysmal atrial fibrillation: Secondary | ICD-10-CM

## 2013-06-11 NOTE — Progress Notes (Signed)
HPI The patient presents for management of paroxysmal atrial fibrillation noted during hospitalization for possible bronchitis. During that admission he was found to have a slightly reduced ejection fraction and was taken off of dronedarone.  He was placed on Eliquis for stroke prophylaxis. We also stopped his ACE inhibitor because of cough. Cardiac catheterization demonstrated some mild nonobstructive coronary disease.  After the last visit he wore a Holter and he had reasonable rate control.  Since I last saw him he has done well.  The patient denies any new symptoms such as chest discomfort, neck or arm discomfort. There has been no new shortness of breath, PND or orthopnea. There have been no reported palpitations, presyncope or syncope.  He might get more fatigued than he used to. 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   No Known Allergies  Current Outpatient Prescriptions  Medication Sig Dispense Refill  . amLODipine (NORVASC) 5 MG tablet Take 1 tablet (5 mg total) by mouth daily.  180 tablet  3  . apixaban (ELIQUIS) 5 MG TABS tablet Take 1 tablet (5 mg total) by mouth 2 (two) times daily.  60 tablet  11  . diltiazem (CARDIZEM CD) 180 MG 24 hr capsule TAKE 1 CAPSULE BY MOUTH ONCE DAILY  30 capsule  3  . fluticasone (FLONASE) 50 MCG/ACT nasal spray Place 2 sprays into the nose daily.  16 g  2  . furosemide (LASIX) 20 MG tablet Take 1 tablet (20 mg total) by mouth daily.  90 tablet  0  . gabapentin (NEURONTIN) 300 MG capsule 300mg  once daily x 3 days, then 300mg  twice daily x 3 days, then 300mg  three times daily to continue  100 capsule  1  . HYDROcodone-acetaminophen (NORCO) 7.5-325 MG per tablet Take 1 tablet by mouth every 8 (eight) hours as needed.  100 tablet  0  . Loperamide HCl (IMODIUM PO) Take by mouth as needed.        . metoprolol succinate (TOPROL-XL) 100 MG 24 hr tablet Take 1 tablet (100 mg total) by mouth 2 (two) times daily.  180 tablet  0  .  predniSONE (DELTASONE) 20 MG tablet Take 0.5 tablets (10 mg total) by mouth daily.  50 tablet  1  . tadalafil (CIALIS) 5 MG tablet Take 1 tablet (5 mg total) by mouth daily as needed for erectile dysfunction.  30 tablet  0   No current facility-administered medications for this visit.    Past Medical History  Diagnosis Date  . Ventricular hypertrophy   . Persistent atrial fibrillation   . Hiatal hernia   . 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 Cholelithiasis   . Sleep apnea   . Nephrolithiasis   . Arthritis   . Hypertension   . Ankylosing spondylitis   . GERD (gastroesophageal reflux disease)   . CAD (coronary artery disease) 4496,7591    30% mid LAD lesion on cardiac cath    Past Surgical History  Procedure Laterality Date  . Cholecystectomy    . Cardiac catheterization  03/2012    30% mid LAD lesion otherwise normal cors, LVEF 65%  . Tonsillectomy      ROS:  As stated in the HPI and negative for all other systems.  PHYSICAL EXAM BP 122/72  Pulse 74  Ht 6\' 1"  (1.854 m)  Wt 220 lb 6.4 oz (99.973 kg)  BMI 29.08 kg/m2 GENERAL:  Well appearing NECK:  No jugular venous distention, waveform within normal limits, carotid upstroke  brisk and symmetric, no bruits, no thyromegaly LYMPHATICS:  No cervical, inguinal adenopathy LUNGS:  Clear to auscultation bilaterally CHEST:  Unremarkable HEART:  PMI not displaced or sustained,S1 and S2 within normal limits, no S3, no S4, no clicks, no rubs, no murmurs ABD:  Flat, positive bowel sounds normal in frequency in pitch, no bruits, no rebound, no guarding, no midline pulsatile mass, no hepatomegaly, no splenomegaly EXT:  2 plus pulses throughout, no edema, no cyanosis no clubbing SKIN:  No rashes no nodules  EKG:  Atrial fibrillation, rate 74, axis within normal limits, intervals within normal limits, poor anterior R wave progression.  06/11/2013  ASSESSMENT AND PLAN  ATRIAL FIBRILLATION:  The patient  tolerates this rhythm and rate  control and anticoagulation. We will continue with the meds as listed.  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  I will continue the current meds and check an echocardiogram.  HTN:  This is being managed in the context of treating his CHF

## 2013-06-11 NOTE — Patient Instructions (Signed)

## 2013-06-17 ENCOUNTER — Telehealth: Payer: Self-pay

## 2013-06-17 ENCOUNTER — Telehealth: Payer: Self-pay | Admitting: Cardiology

## 2013-06-17 NOTE — Telephone Encounter (Signed)
PATIENT CALLED FOR SAMPLES OF ELIQUIS PLACED SAMPLES UP FRONT

## 2013-06-17 NOTE — Telephone Encounter (Signed)
Walk in pt Form " [employer]" papers Dropped off Pam back Friday  5.22.15/kdm

## 2013-06-29 ENCOUNTER — Telehealth: Payer: Self-pay | Admitting: *Deleted

## 2013-06-29 NOTE — Telephone Encounter (Signed)
Faxed to [facility] physician provider part of application for assistance for [place], also faxed copy of script, patient wants to be called when these forms are complete so I will call him                                                                                                                                                                                                                                           .

## 2013-06-29 NOTE — Telephone Encounter (Signed)
Called patient, he will be here in the am for appointment and will pick up patient assistance forms then

## 2013-06-30 ENCOUNTER — Ambulatory Visit (HOSPITAL_COMMUNITY): Payer: Medicare Other | Attending: Cardiology | Admitting: Radiology

## 2013-06-30 ENCOUNTER — Telehealth: Payer: Self-pay | Admitting: *Deleted

## 2013-06-30 DIAGNOSIS — I517 Cardiomegaly: Secondary | ICD-10-CM | POA: Diagnosis not present

## 2013-06-30 DIAGNOSIS — I359 Nonrheumatic aortic valve disorder, unspecified: Secondary | ICD-10-CM | POA: Diagnosis not present

## 2013-06-30 DIAGNOSIS — I10 Essential (primary) hypertension: Secondary | ICD-10-CM | POA: Insufficient documentation

## 2013-06-30 DIAGNOSIS — I079 Rheumatic tricuspid valve disease, unspecified: Secondary | ICD-10-CM | POA: Insufficient documentation

## 2013-06-30 DIAGNOSIS — I509 Heart failure, unspecified: Secondary | ICD-10-CM | POA: Insufficient documentation

## 2013-06-30 DIAGNOSIS — I4891 Unspecified atrial fibrillation: Secondary | ICD-10-CM

## 2013-06-30 NOTE — Progress Notes (Signed)
Echocardiogram performed.  

## 2013-06-30 NOTE — Telephone Encounter (Signed)
Patient requests eliquis samples. I will place at the front desk for pick up. 

## 2013-07-02 ENCOUNTER — Telehealth: Payer: Self-pay | Admitting: Cardiology

## 2013-07-02 NOTE — Telephone Encounter (Signed)
Reviewed preliminary results with pt who is aware Dr Percival Spanish has not reviewed the echo yet.

## 2013-07-02 NOTE — Telephone Encounter (Signed)
New message ° ° ° °Patient calling for echo test results °

## 2013-07-06 ENCOUNTER — Telehealth: Payer: Self-pay | Admitting: Family Medicine

## 2013-07-06 NOTE — Telephone Encounter (Signed)
Pt needs new hydrocodone °

## 2013-07-07 MED ORDER — HYDROCODONE-ACETAMINOPHEN 7.5-325 MG PO TABS: 1.0000 | ORAL_TABLET | Freq: Three times a day (TID) | ORAL | Status: DC | PRN

## 2013-07-07 NOTE — Telephone Encounter (Signed)
Rx ready for pick up and patient is aware 

## 2013-07-21 ENCOUNTER — Other Ambulatory Visit: Payer: Self-pay | Admitting: *Deleted

## 2013-07-21 MED ORDER — FUROSEMIDE 20 MG PO TABS: 20.0000 mg | ORAL_TABLET | Freq: Every day | ORAL | Status: DC

## 2013-08-05 ENCOUNTER — Telehealth: Payer: Self-pay | Admitting: *Deleted

## 2013-08-05 ENCOUNTER — Telehealth: Payer: Self-pay | Admitting: Family Medicine

## 2013-08-05 ENCOUNTER — Other Ambulatory Visit: Payer: Self-pay | Admitting: Cardiology

## 2013-08-05 NOTE — Telephone Encounter (Signed)
Patient requests eliquis samples. I will place at the front desk for pick up.

## 2013-08-05 NOTE — Telephone Encounter (Signed)
Pt is needing new rx HYDROcodone-acetaminophen (NORCO) 7.5-325 MG per tablet Please call when available for pick.

## 2013-08-06 MED ORDER — HYDROCODONE-ACETAMINOPHEN 7.5-325 MG PO TABS: 1.0000 | ORAL_TABLET | Freq: Three times a day (TID) | ORAL | Status: DC | PRN

## 2013-08-06 NOTE — Telephone Encounter (Signed)
rx refilled and signed. Pt notified

## 2013-08-31 ENCOUNTER — Telehealth: Payer: Self-pay | Admitting: Family Medicine

## 2013-08-31 NOTE — Telephone Encounter (Signed)
Pt needs new rx hydrocodone °

## 2013-09-03 MED ORDER — HYDROCODONE-ACETAMINOPHEN 7.5-325 MG PO TABS: 1.0000 | ORAL_TABLET | Freq: Three times a day (TID) | ORAL | Status: DC | PRN

## 2013-09-03 NOTE — Telephone Encounter (Signed)
Rx ready for pick up.  Unable to reach patient.

## 2013-09-10 ENCOUNTER — Telehealth: Payer: Self-pay

## 2013-09-10 NOTE — Telephone Encounter (Signed)
Patient called for samples of crestor placed samples of eliquis up front

## 2013-09-28 ENCOUNTER — Other Ambulatory Visit: Payer: Self-pay | Admitting: Family Medicine

## 2013-10-01 DIAGNOSIS — M1A00X1 Idiopathic chronic gout, unspecified site, with tophus (tophi): Secondary | ICD-10-CM | POA: Diagnosis not present

## 2013-10-07 ENCOUNTER — Telehealth: Payer: Self-pay

## 2013-10-07 NOTE — Telephone Encounter (Signed)
Patient called to get samples of crestor placed samples up front

## 2013-10-19 ENCOUNTER — Telehealth: Payer: Self-pay | Admitting: Family Medicine

## 2013-10-19 NOTE — Telephone Encounter (Signed)
Pt needs re-fill on HYDROcodone-acetaminophen (NORCO) 7.5-325 MG per tablet ° °

## 2013-10-20 MED ORDER — HYDROCODONE-ACETAMINOPHEN 7.5-325 MG PO TABS: 1.0000 | ORAL_TABLET | Freq: Three times a day (TID) | ORAL | Status: DC | PRN

## 2013-10-20 NOTE — Telephone Encounter (Signed)
Rx ready for pick up and patient is aware 

## 2013-11-07 DIAGNOSIS — Z23 Encounter for immunization: Secondary | ICD-10-CM | POA: Diagnosis not present

## 2013-11-17 ENCOUNTER — Telehealth: Payer: Self-pay | Admitting: Family Medicine

## 2013-11-17 MED ORDER — HYDROCODONE-ACETAMINOPHEN 7.5-325 MG PO TABS: 1.0000 | ORAL_TABLET | Freq: Three times a day (TID) | ORAL | Status: DC | PRN

## 2013-11-17 NOTE — Telephone Encounter (Signed)
Rx ready for pick up and patient is aware 

## 2013-11-17 NOTE — Telephone Encounter (Signed)
Pt needs new rx hydrocodone °

## 2013-12-18 ENCOUNTER — Telehealth: Payer: Self-pay | Admitting: *Deleted

## 2013-12-18 NOTE — Telephone Encounter (Signed)
Eliquis samples placed at the front desk for patient. 

## 2013-12-29 ENCOUNTER — Telehealth: Payer: Self-pay | Admitting: Family Medicine

## 2013-12-29 MED ORDER — HYDROCODONE-ACETAMINOPHEN 7.5-325 MG PO TABS: 1.0000 | ORAL_TABLET | Freq: Three times a day (TID) | ORAL | Status: DC | PRN

## 2013-12-29 NOTE — Telephone Encounter (Signed)
Pt request refill HYDROcodone-acetaminophen (NORCO) 7.5-325 MG per tablet °

## 2013-12-29 NOTE — Telephone Encounter (Signed)
Rx ready for pick up and patient is aware 

## 2014-01-07 ENCOUNTER — Encounter (HOSPITAL_COMMUNITY): Payer: Self-pay | Admitting: Cardiovascular Disease

## 2014-01-14 ENCOUNTER — Ambulatory Visit (INDEPENDENT_AMBULATORY_CARE_PROVIDER_SITE_OTHER): Payer: Medicare Other | Admitting: Cardiology

## 2014-01-14 ENCOUNTER — Encounter: Payer: Self-pay | Admitting: Cardiology

## 2014-01-14 VITALS — BP 120/76 | HR 87 | Ht 72.0 in | Wt 225.4 lb

## 2014-01-14 DIAGNOSIS — R0602 Shortness of breath: Secondary | ICD-10-CM

## 2014-01-14 DIAGNOSIS — I2510 Atherosclerotic heart disease of native coronary artery without angina pectoris: Secondary | ICD-10-CM

## 2014-01-14 DIAGNOSIS — Z79899 Other long term (current) drug therapy: Secondary | ICD-10-CM

## 2014-01-14 LAB — BRAIN NATRIURETIC PEPTIDE: BRAIN NATRIURETIC PEPTIDE: 166.9 pg/mL — AB (ref 0.0–100.0)

## 2014-01-14 LAB — BASIC METABOLIC PANEL
BUN: 14 mg/dL (ref 6–23)
CO2: 28 mEq/L (ref 19–32)
Calcium: 9.7 mg/dL (ref 8.4–10.5)
Chloride: 103 mEq/L (ref 96–112)
Creat: 1.37 mg/dL — ABNORMAL HIGH (ref 0.50–1.35)
GLUCOSE: 89 mg/dL (ref 70–99)
Potassium: 3.7 mEq/L (ref 3.5–5.3)
SODIUM: 143 meq/L (ref 135–145)

## 2014-01-14 MED ORDER — FUROSEMIDE 20 MG PO TABS: 20.0000 mg | ORAL_TABLET | Freq: Every day | ORAL | Status: DC

## 2014-01-14 MED ORDER — METOPROLOL SUCCINATE ER 100 MG PO TB24: ORAL_TABLET | ORAL | Status: DC

## 2014-01-14 MED ORDER — APIXABAN 5 MG PO TABS: 5.0000 mg | ORAL_TABLET | Freq: Two times a day (BID) | ORAL | Status: DC

## 2014-01-14 NOTE — Progress Notes (Signed)
01/14/2014 [name] Woods   24-May-1940  [id number]  Primary Physician [name] Woods Zenia Resides, MD Primary Cardiologist: Dr. Percival Woods  HPI:  The patient is a 73 year old male, who is followed by Dr. Percival Woods. His cardiac history is significant for paroxysmal atrial fibrillation. This has been well-controlled with Metroprolol and Cardizem. He is on Eliquis for stroke prophylaxis. His history is also notable for mild nonobstructive coronary disease by [facility] 03/24/2012. Ejection fraction at that time was mildly reduced at 45%. However, repeat 2-D echocardiogram 07/02/2013 revealed improved systolic function with an estimated ejection fraction of 55%.  He presents to clinic today for evaluation. He has some concerns regarding recent episodes of dyspnea on exertion and fluid retention, notably in his hands and feet. This occurred several weeks ago but appears to be improving. He reports noticing increased fluid retention while taking prednisone for a recent acute gout flare. He discontinued prednisone a week ago. Since then, things have improved. Today in clinic, he denies any dyspnea. He also denies any recent orthopnea/PND at home. No recent chest pain, palpitations, lightheadedness, dizziness, syncope/near-syncope. He reports full medication compliance. He takes 40 mg of Lasix daily. He denies any abnormal bleeding with Eliquis.    Current Outpatient Prescriptions  Medication Sig Dispense Refill  . apixaban (ELIQUIS) 5 MG TABS tablet Take 1 tablet (5 mg total) by mouth 2 (two) times daily. 60 tablet 11  . CIALIS 5 MG tablet TAKE 1 TABLET (5 MG TOTAL) BY MOUTH DAILY AS NEEDED FOR ERECTILE DYSFUNCTION. 10 tablet 0  . diltiazem (CARDIZEM CD) 180 MG 24 hr capsule TAKE 1 CAPSULE BY MOUTH ONCE DAILY 30 capsule 3  . furosemide (LASIX) 20 MG tablet Take 1 tablet (20 mg total) by mouth daily. 90 tablet 1  . HYDROcodone-acetaminophen (NORCO) 7.5-325 MG per tablet Take 1 tablet by mouth every 8 (eight) hours  as needed. 100 tablet 0  . Loperamide HCl (IMODIUM PO) Take by mouth as needed.      . metoprolol succinate (TOPROL-XL) 100 MG 24 hr tablet TAKE 1 TABLET BY MOUTH 2 TIMES DAILY. 180 tablet 1  . predniSONE (DELTASONE) 20 MG tablet Take 0.5 tablets (10 mg total) by mouth daily. 50 tablet 1   No current facility-administered medications for this visit.    No Known Allergies  History   Social History  . Marital Status: Married    Spouse Name: N/A    Number of Children: 4  . Years of Education: N/A   Occupational History  . Retired     Estate manager/land agent   Social History Main Topics  . Smoking status: Never Smoker   . Smokeless tobacco: Never Used  . Alcohol Use: No  . Drug Use: No  . Sexual Activity: Not on file   Other Topics Concern  . Not on file   Social History Narrative   4 caffeine drinks daily      Review of Systems: General: negative for chills, fever, night sweats or weight changes.  Cardiovascular: negative for chest pain, dyspnea on exertion, edema, orthopnea, palpitations, paroxysmal nocturnal dyspnea or shortness of breath Dermatological: negative for rash Respiratory: negative for cough or wheezing Urologic: negative for hematuria Abdominal: negative for nausea, vomiting, diarrhea, bright red blood per rectum, melena, or hematemesis Neurologic: negative for visual changes, syncope, or dizziness All other systems reviewed and are otherwise negative except as noted above.    Pulse 87, height 6' (1.829 m), weight 225 lb 6.4 oz (102.241 kg).  General appearance: alert,  cooperative and no distress Neck: no carotid bruit and no JVD Lungs: clear to auscultation bilaterally Heart: irregularly irregular rhythm Extremities: no LEE Pulses: 2+ and symmetric Skin: warm and dry Neurologic: Grossly normal  EKG Atrial Fibrillation with a controlled ventricular response 87 bpm   ASSESSMENT AND PLAN:   1. DOB/fluid retention: most recent echocardiogram in June of  this year revealed normal systolic function with an estimated ejection fraction of 55%. There was no mention of any diastolic dysfunction at that time. It is likely that his increased fluid retention was probably subsequent to prednisone therapy. Symptoms have since improved since discontinuation of prednisone. On physical exam, he appears to be euvolemic. He denies any recent dyspnea on exertion since stopping prednisone. However, given his history, we will check a BNP to further assess volume status. I recommended that he continue his current dose of Lasix. If BNP returns significantly elevated, we will notify the patient by phone to temporarily increase his Lasix for several days. It was also advised that he refrain from high sodium intake. Patient also instructed to monitor weight  closely at home and to notify our office of any significant increases or increased dyspnea.  2. Atrial fibrillation: EKG demonstrates atrial fibrillation however ventricular rate is well controlled in the 80s. Continue with metoprolol and Cardizem for rate control. Continued Eliquis BID for stroke prophylaxis.    Woods, [name] 01/14/2014 8:17 AM

## 2014-01-14 NOTE — Patient Instructions (Signed)
Please complete blood work today after your appointment. We will contact you with the results.  Your refills have been sent in to the pharmacy.  You will be contacted in several months to schedule your June appointment with Dr. Percival Spanish.

## 2014-01-28 ENCOUNTER — Telehealth: Payer: Self-pay | Admitting: Family Medicine

## 2014-01-28 MED ORDER — HYDROCODONE-ACETAMINOPHEN 7.5-325 MG PO TABS: 1.0000 | ORAL_TABLET | Freq: Three times a day (TID) | ORAL | Status: DC | PRN

## 2014-01-28 NOTE — Telephone Encounter (Signed)
Pt request refill HYDROcodone-acetaminophen (NORCO) 7.5-325 MG per tablet °

## 2014-01-28 NOTE — Telephone Encounter (Signed)
rx ready for pick up and patient is aware  

## 2014-02-01 ENCOUNTER — Telehealth: Payer: Self-pay | Admitting: Cardiology

## 2014-02-01 NOTE — Telephone Encounter (Signed)
Pt would like his lab results from 01-14-14 please.

## 2014-02-01 NOTE — Telephone Encounter (Signed)
Patient seen 01/14/14,   Labs done same day, BNP was 166.9.   He had been instructed by Ellen Henri that we may increase his Lasix dose for several days if BNP was elevated.  On 20mg  Lasix daily. How much should we increase for how many days?

## 2014-02-03 ENCOUNTER — Telehealth: Payer: Self-pay | Admitting: *Deleted

## 2014-02-03 NOTE — Telephone Encounter (Signed)
Eliquis samples placed at the front desk for patient. 

## 2014-02-04 ENCOUNTER — Encounter: Payer: Self-pay | Admitting: Family Medicine

## 2014-02-04 ENCOUNTER — Telehealth: Payer: Self-pay | Admitting: Family Medicine

## 2014-02-04 ENCOUNTER — Ambulatory Visit (INDEPENDENT_AMBULATORY_CARE_PROVIDER_SITE_OTHER): Payer: Medicare Other | Admitting: Family Medicine

## 2014-02-04 VITALS — BP 118/80 | Temp 98.6°F | Wt 227.0 lb

## 2014-02-04 DIAGNOSIS — R1012 Left upper quadrant pain: Secondary | ICD-10-CM | POA: Diagnosis not present

## 2014-02-04 DIAGNOSIS — G8929 Other chronic pain: Secondary | ICD-10-CM | POA: Diagnosis not present

## 2014-02-04 DIAGNOSIS — R109 Unspecified abdominal pain: Principal | ICD-10-CM

## 2014-02-04 NOTE — Telephone Encounter (Signed)
Patient states he's having left side pain going into the groin.  He said he would like to see Dr. Sherren Mocha today and can't wait till Monday.  Please advise.

## 2014-02-04 NOTE — Telephone Encounter (Signed)
Please disregard this message,it should go to provider [place].

## 2014-02-04 NOTE — Telephone Encounter (Signed)
Message was sent to correct provider,thank you.

## 2014-02-04 NOTE — Telephone Encounter (Signed)
Pt scheduled  

## 2014-02-04 NOTE — Patient Instructions (Signed)
Drink lots water  Strain all urines  We will get you set up to see the urologist ASAP

## 2014-02-04 NOTE — Telephone Encounter (Signed)
Okay to work in at [employer]

## 2014-02-04 NOTE — Telephone Encounter (Signed)
Pt would like his lab results from 01-14-14 please.

## 2014-02-04 NOTE — Progress Notes (Signed)
   Subjective:    Patient ID: [name] Woods, male    DOB: 04/15/1940, 74 y.o.   MRN: [id number]  HPI [name] Woods is a 74 year old single male nonsmoker who comes in today for evaluation of left flank pain for about a month.  He says he says the gradual onset of left flank pain about a month ago. He describes the pain is sharp constant 5 on a scale of 1-10 and radiates down to his left testicle.  In the past he has past 20 kidney stones. His urolog[place] Dr. Hessie Dien[place]  he's not seen a urologist in 20+ years   Review of Systems    no fever chills nausea vomiting diarrhea change in bowel or urinary tract habits. No night sweats weight loss etc. Objective:   Physical Exam Well-developed well-nourished male no acute distress vital signs stable he is afebrile abdominal exam is negative  Genitorectal exam normal stool guaiac-negative prostate 1+ symmetrical nonnodular BPH       Assessment & Plan:  Left flank pain........Marland Kitchen probable recurrence of his kidney stones.........Marland Kitchen refer to urology for further evaluation

## 2014-02-04 NOTE — Progress Notes (Signed)
Pre visit review using our clinic review tool, if applicable. No additional management support is needed unless otherwise documented below in the visit note. 

## 2014-02-04 NOTE — Telephone Encounter (Signed)
Pt still waiting for lab results from 01-14-14 please.

## 2014-02-05 ENCOUNTER — Telehealth: Payer: Self-pay | Admitting: *Deleted

## 2014-02-05 NOTE — Telephone Encounter (Signed)
Pt following up on labwork. Had a BNP drawn of 166 on 01/14/14. Last BNP drawn was in April 2014 and was WNL.   He is having some increased swelling in the legs, mild per description, continued from December. He would like to see if any med changes are advised.  Currently, patient is taking 20mg  Lasix daily. He is not currently prescribed a Potassium supplement.  I routed a previous message to [place] but have not received advice. She had indicated to him on last visit that lasix may need to be increased.

## 2014-02-12 NOTE — Telephone Encounter (Signed)
BNP was only minimally elevated.  No change in therapy.  Call [name] Woods with the results

## 2014-02-12 NOTE — Telephone Encounter (Signed)
Spoke with patient. Informed him of results. He is aware of his results per MyChart but was concerned about his BNP being 166. Informed him Dr. Percival Spanish did not want to make changes to his medication therapy at this time. He voiced understanding and thanked Therapist, sports for calling.

## 2014-02-12 NOTE — Telephone Encounter (Signed)
Attempted to call patient. No answer, no VM.

## 2014-02-25 ENCOUNTER — Telehealth: Payer: Self-pay | Admitting: Family Medicine

## 2014-02-25 MED ORDER — HYDROCODONE-ACETAMINOPHEN 7.5-325 MG PO TABS: 1.0000 | ORAL_TABLET | Freq: Three times a day (TID) | ORAL | Status: DC | PRN

## 2014-02-25 NOTE — Telephone Encounter (Signed)
Ok per Dr Sherren Mocha Attempted to call patient but unable to leave message. Rx ready for pick up.

## 2014-02-25 NOTE — Telephone Encounter (Signed)
Pt request refill HYDROcodone-acetaminophen (NORCO) 7.5-325 MG per tablet °

## 2014-02-27 IMAGING — CR DG CHEST 2V
2 series · 2 of 2 positions shown · non-contrast
Comparison: PA and lateral chest 04/15/2009 and 06/22/2008.

CLINICAL DATA: Cough for 5 weeks.

CHEST - 2 VIEW

[w chest pa]
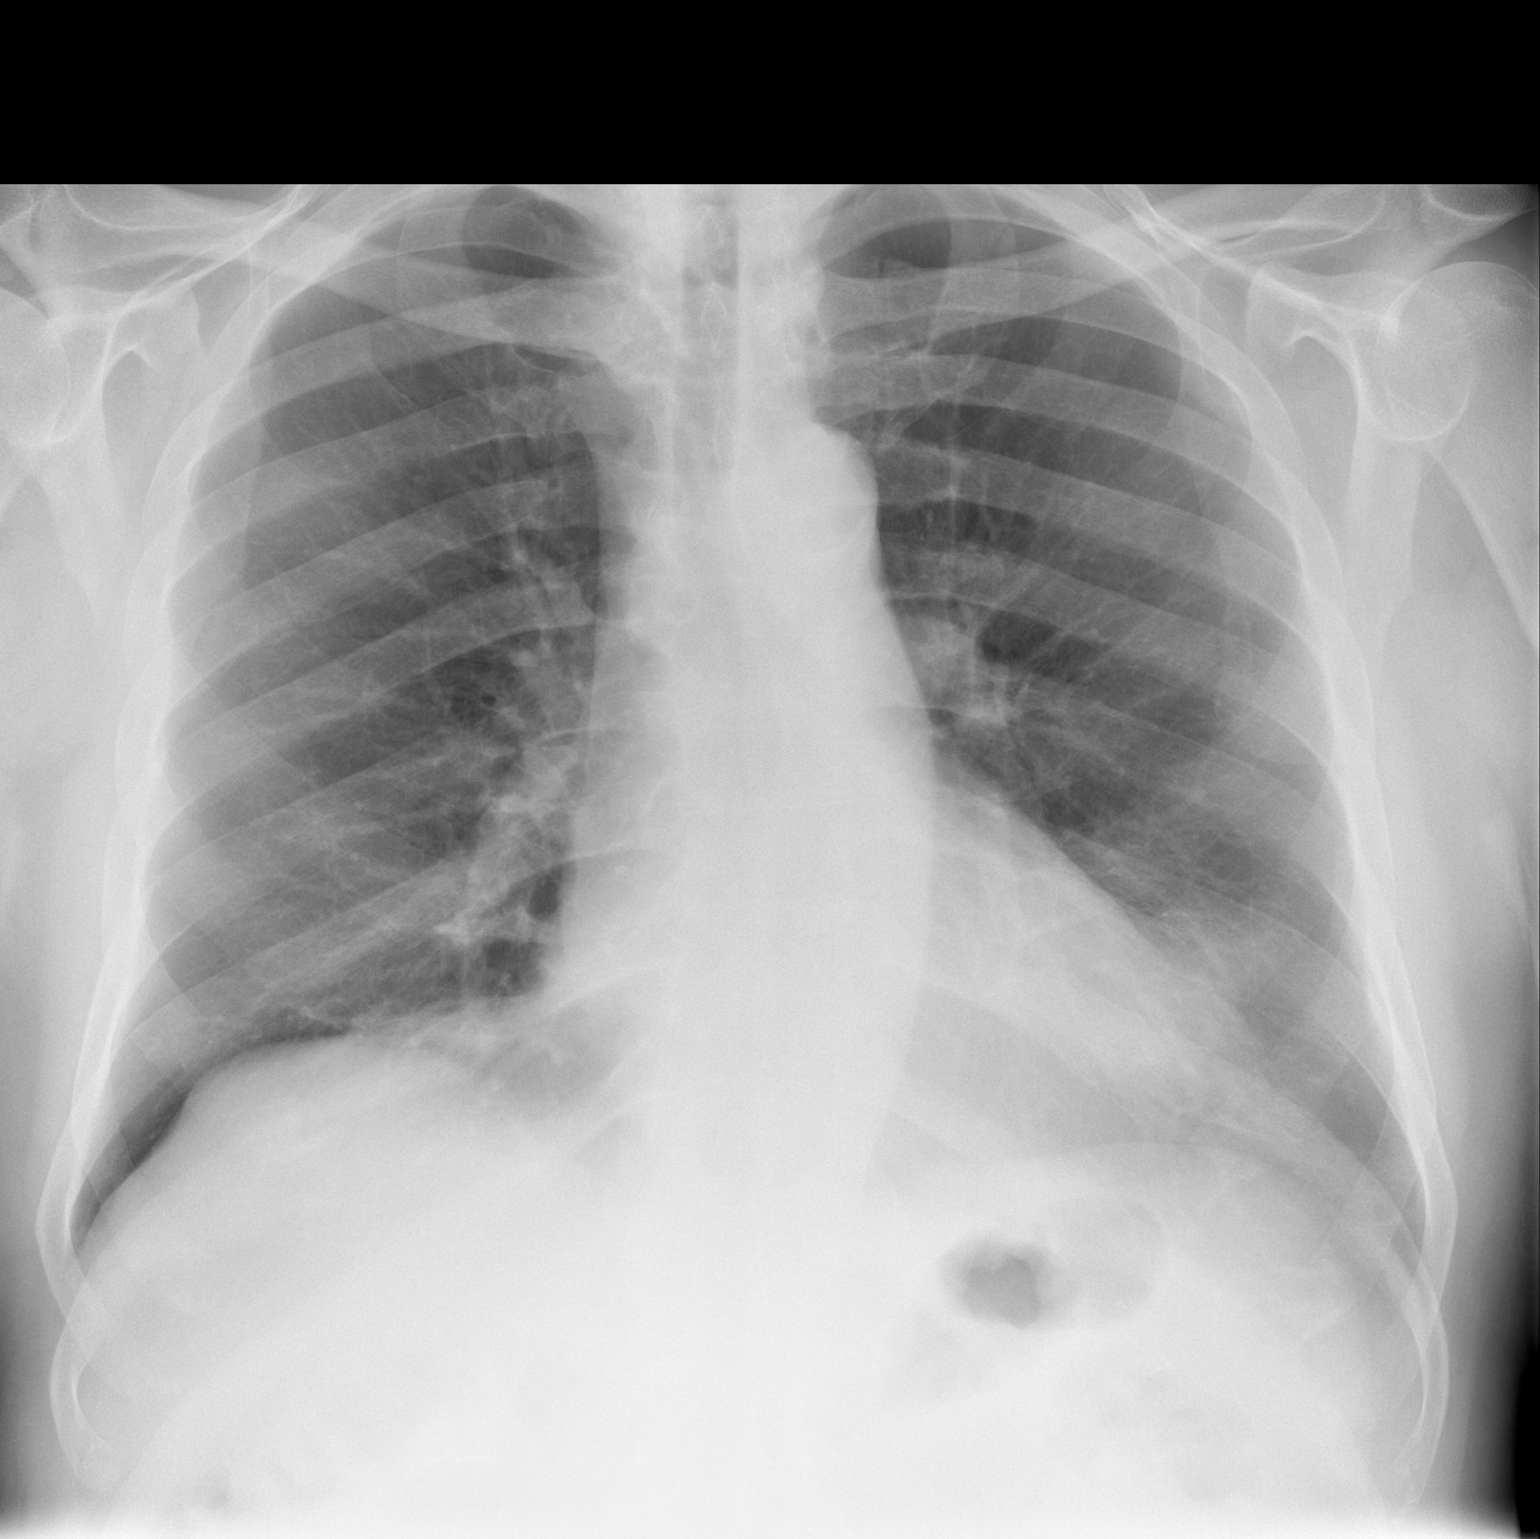

[w chest lat]
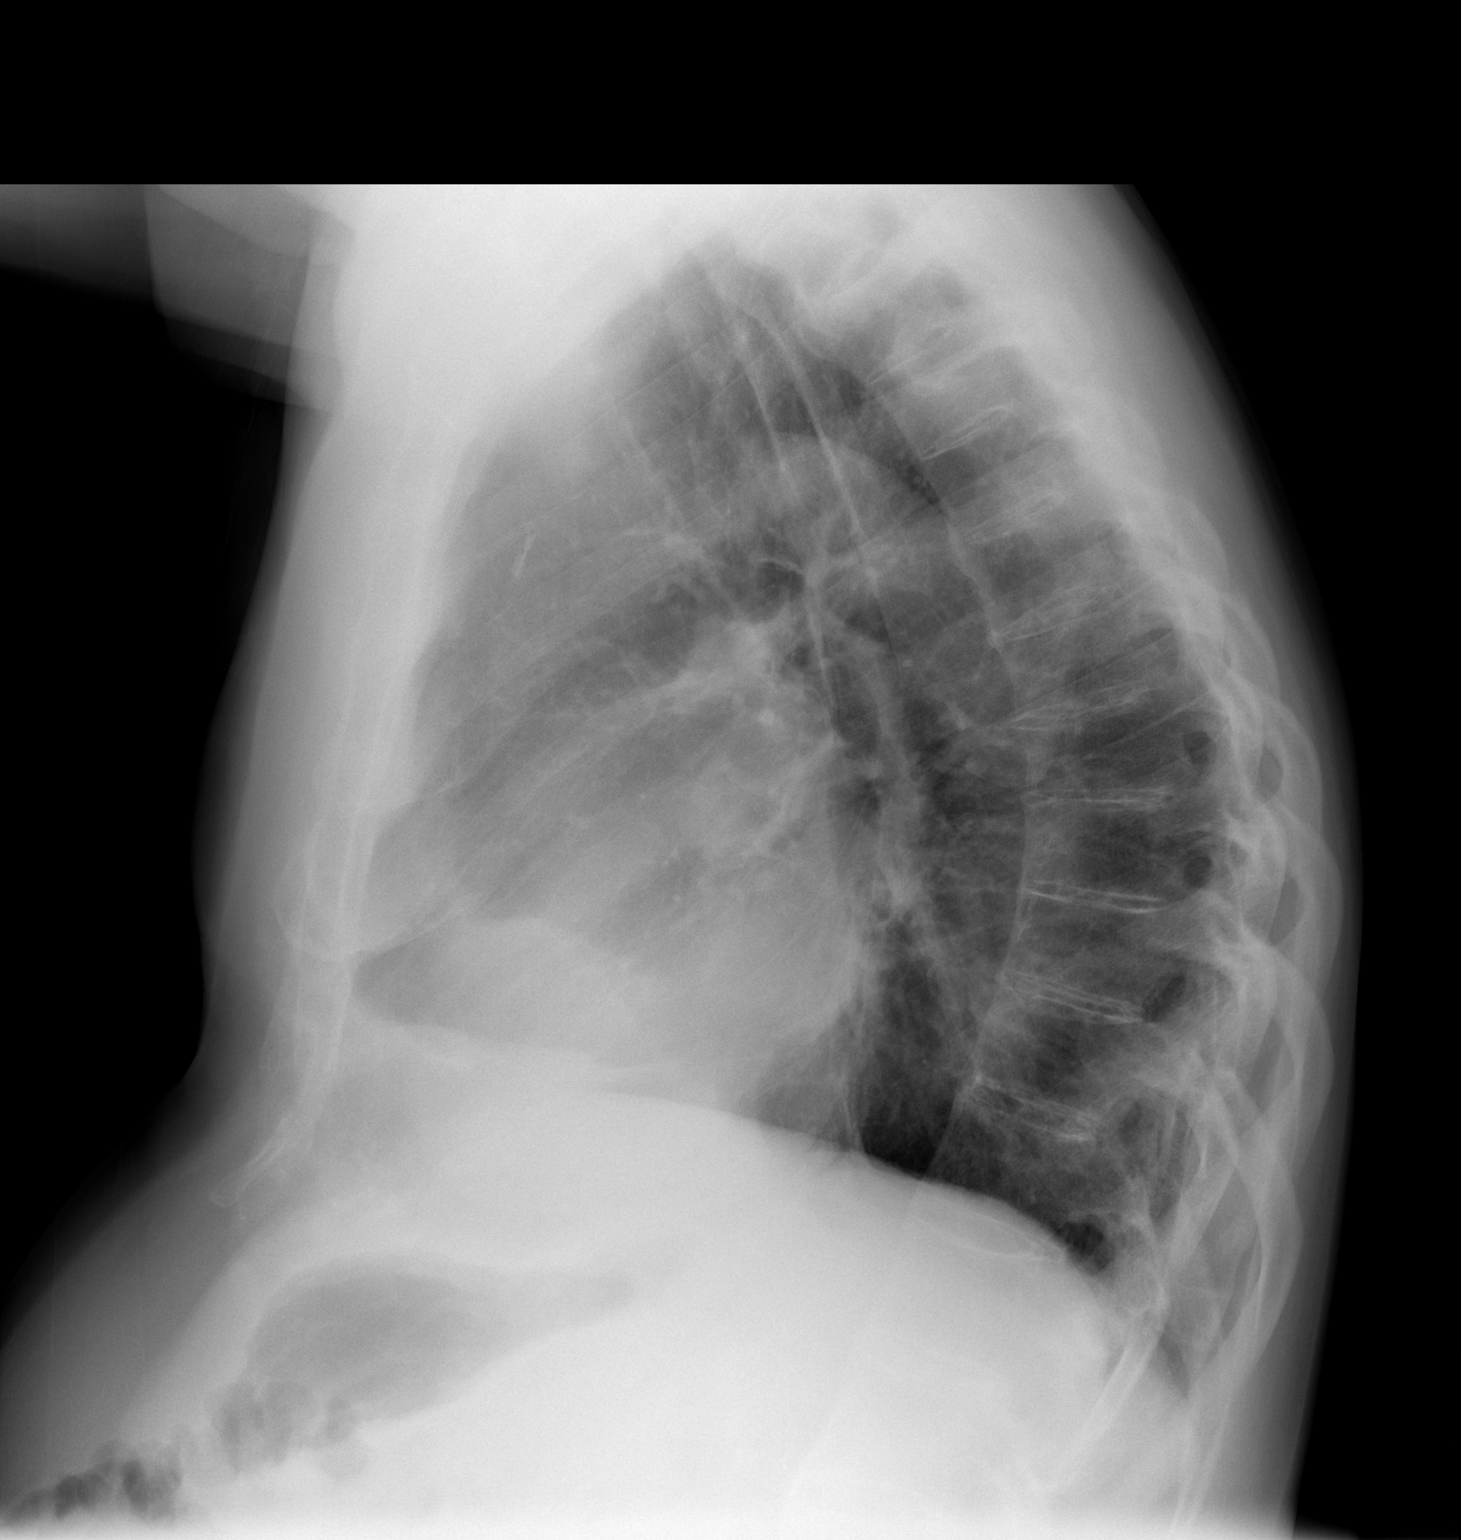

[2 of 2 positions shown; findings below may reference images not displayed]

FINDINGS: The lungs are clear.  Heart size is normal.  No
pneumothorax or pleural fluid.  Flowing syndesmophytes throughout
the thoracic spine are consistent with ankylosing spondylitis and
are stable in appearance.
IMPRESSION: No acute disease.

## 2014-03-08 ENCOUNTER — Telehealth: Payer: Self-pay | Admitting: Cardiology

## 2014-03-08 MED ORDER — APIXABAN 5 MG PO TABS: 5.0000 mg | ORAL_TABLET | Freq: Two times a day (BID) | ORAL | Status: DC

## 2014-03-08 NOTE — Telephone Encounter (Signed)
Pt would like some samples of Elquis please.

## 2014-03-08 NOTE — Telephone Encounter (Signed)
Medication samples have been provided to the patient.  Drug name: Eliquis 5 Qty: 70 tabs LOT: [id number] Exp.Date: 03/2016  Samples left at front desk for patient pick-up. Patient notified.  T[place]itt, Chelley 3:32 PM 03/08/2014

## 2014-03-25 ENCOUNTER — Telehealth: Payer: Self-pay | Admitting: Family Medicine

## 2014-03-25 NOTE — Telephone Encounter (Signed)
Pt request refill of the following: HYDROcodone-acetaminophen (NORCO) 7.5-325 MG per tablet ° ° °Phamacy: ° °

## 2014-03-29 MED ORDER — HYDROCODONE-ACETAMINOPHEN 7.5-325 MG PO TABS: 1.0000 | ORAL_TABLET | Freq: Three times a day (TID) | ORAL | Status: DC | PRN

## 2014-03-29 NOTE — Telephone Encounter (Signed)
Rx ready for pick up.  Patient would like to transfer to the [place] office.  Note left for patient to call that office to schedule an appointment.

## 2014-03-30 DIAGNOSIS — N200 Calculus of kidney: Secondary | ICD-10-CM | POA: Diagnosis not present

## 2014-03-30 DIAGNOSIS — N289 Disorder of kidney and ureter, unspecified: Secondary | ICD-10-CM | POA: Diagnosis not present

## 2014-03-30 DIAGNOSIS — N281 Cyst of kidney, acquired: Secondary | ICD-10-CM | POA: Diagnosis not present

## 2014-03-30 DIAGNOSIS — R109 Unspecified abdominal pain: Secondary | ICD-10-CM | POA: Diagnosis not present

## 2014-03-30 DIAGNOSIS — D495 Neoplasm of unspecified behavior of other genitourinary organs: Secondary | ICD-10-CM | POA: Diagnosis not present

## 2014-04-14 ENCOUNTER — Telehealth: Payer: Self-pay | Admitting: Cardiology

## 2014-04-14 NOTE — Telephone Encounter (Signed)
Pt called in wanting some samples of Eliquis . Please call  Thanks

## 2014-04-15 MED ORDER — APIXABAN 5 MG PO TABS: 5.0000 mg | ORAL_TABLET | Freq: Two times a day (BID) | ORAL | Status: DC

## 2014-04-15 NOTE — Telephone Encounter (Addendum)
Can this encounter be closed?

## 2014-04-15 NOTE — Telephone Encounter (Signed)
Medication samples have been provided to the patient.  Drug name: Eliquis 5 mg Qty: 14 tabs LOT: [id number] Exp.Date: 05/2016 Drug name: Eliquis 5 mg Qty: 42 tabs LOT: [id number] Exp.Date: 10/2016    Samples left at front desk for patient pick-up. Patient notified.  Ewan Grau, Chelley 3:40 PM 04/15/2014

## 2014-04-26 ENCOUNTER — Ambulatory Visit: Payer: Medicare Other | Admitting: Physician Assistant

## 2014-05-03 ENCOUNTER — Ambulatory Visit (INDEPENDENT_AMBULATORY_CARE_PROVIDER_SITE_OTHER): Payer: Medicare Other | Admitting: Physician Assistant

## 2014-05-03 ENCOUNTER — Encounter: Payer: Self-pay | Admitting: Physician Assistant

## 2014-05-03 VITALS — BP 147/72 | HR 68 | Temp 97.8°F | Resp 16 | Ht 72.0 in | Wt 221.0 lb

## 2014-05-03 DIAGNOSIS — Z23 Encounter for immunization: Secondary | ICD-10-CM | POA: Diagnosis not present

## 2014-05-03 DIAGNOSIS — I10 Essential (primary) hypertension: Secondary | ICD-10-CM

## 2014-05-03 DIAGNOSIS — L819 Disorder of pigmentation, unspecified: Secondary | ICD-10-CM

## 2014-05-03 MED ORDER — HYDROCODONE-ACETAMINOPHEN 10-325 MG PO TABS: 1.0000 | ORAL_TABLET | Freq: Four times a day (QID) | ORAL | Status: DC | PRN

## 2014-05-03 MED ORDER — TADALAFIL 5 MG PO TABS: ORAL_TABLET | ORAL | Status: DC

## 2014-05-03 MED ORDER — FUROSEMIDE 20 MG PO TABS: 20.0000 mg | ORAL_TABLET | Freq: Every day | ORAL | Status: DC

## 2014-05-03 MED ORDER — METOPROLOL SUCCINATE ER 100 MG PO TB24: ORAL_TABLET | ORAL | Status: DC

## 2014-05-03 MED ORDER — AMLODIPINE BESYLATE 5 MG PO TABS: 5.0000 mg | ORAL_TABLET | Freq: Every day | ORAL | Status: DC

## 2014-05-03 MED ORDER — APIXABAN 5 MG PO TABS: 5.0000 mg | ORAL_TABLET | Freq: Two times a day (BID) | ORAL | Status: DC

## 2014-05-03 NOTE — Progress Notes (Signed)
Pre visit review using our clinic review tool, if applicable. No additional management support is needed unless otherwise documented below in the visit note/SLS  

## 2014-05-03 NOTE — Progress Notes (Signed)
Patient presents to clinic today to establish care.  Acute Concerns: Patient complains of chronic non-healing skin lesion of scalp.  Endorses hx of skin cancer. Is not currently followed by Dermatology.  Chronic Issues: Hypertension -- Endorses well-controlled with current medications regimen.  Patient denies chest pain, palpitations, lightheadedness, dizziness, vision changes or frequent headaches.  Erectile Dysfunction -- endorses improved with Cialis 5 mg PRN.   Past Medical History  Diagnosis Date  . Ventricular hypertrophy   . Persistent atrial fibrillation   . Hiatal hernia   . 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 Cholelithiasis   . Sleep apnea   . Nephrolithiasis   . Arthritis   . Hypertension   . Ankylosing spondylitis   . GERD (gastroesophageal reflux disease)   . CAD (coronary artery disease) 0102,7253    30% mid LAD lesion on cardiac cath  . Chicken pox   . [place] measles     Past Surgical History  Procedure Laterality Date  . Cholecystectomy    . Cardiac catheterization  03/2012    30% mid LAD lesion otherwise normal cors, LVEF 65%  . Tonsillectomy    . Left and right heart catheterization with coronary angiogram N/A 03/24/2012    Procedure: LEFT AND RIGHT HEART CATHETERIZATION WITH CORONARY ANGIOGRAM;  Surgeon: Sherren Mocha, MD;  Location: [facility] CATH LAB;  Service: Cardiovascular;  Laterality: N/A;  . Elbow arthroplasty      Left  . Leg fluid removal right      Current Outpatient Prescriptions on File Prior to Visit  Medication Sig Dispense Refill  . Loperamide HCl (IMODIUM PO) Take by mouth as needed.       No current facility-administered medications on file prior to visit.    Allergies  Allergen Reactions  . Coumadin [Warfarin]     GI bleeds    Family History  Problem Relation Age of Onset  . Colon cancer Neg Hx   . Heart disease Father 48    Deceased  . Heart attack Father 59  . Heart disease Brother 85    "Died in his sleep"  .  Arthritis/Rheumatoid Mother     Deceased-86  . Dementia Paternal Grandmother   . Cancer Paternal Uncle   . Hyperlipidemia Brother   . Stroke Brother 60    Deceased  . Other Sister     Estate agent in [place]  . Other Sister     MVA  . Other Brother     Carjacked-killed  . Healthy Brother   . Healthy Son     X3  . Healthy Daughter     x1    History   Social History  . Marital Status: Married    Spouse Name: N/A  . Number of Children: 4  . Years of Education: N/A   Occupational History  . Retired     Estate manager/land agent   Social History Main Topics  . Smoking status: Never Smoker   . Smokeless tobacco: Never Used  . Alcohol Use: No  . Drug Use: No  . Sexual Activity: Not on file   Other Topics Concern  . Not on file   Social History Narrative   4 caffeine drinks daily    ROS Pertinent review of systems are listed in the history of present illness.  BP 147/72 mmHg  Pulse 68  Temp(Src) 97.8 F (36.6 C) (Oral)  Resp 16  Ht 6' (1.829 m)  Wt 221 lb (100.245 kg)  BMI 29.97 kg/m2  SpO2 99%  Physical Exam  Constitutional: He is oriented to person, place, and time and well-developed, well-nourished, and in no distress.  HENT:  Head: Normocephalic and atraumatic.  Eyes: Conjunctivae are normal.  Neck: Neck supple.  Cardiovascular: Normal rate, regular rhythm, normal heart sounds and intact distal pulses.   Pulmonary/Chest: Effort normal and breath sounds normal. No respiratory distress. He has no wheezes. He has no rales. He exhibits no tenderness.  Neurological: He is alert and oriented to person, place, and time.  Skin: Skin is warm and dry. Lesion noted. No rash noted.  Psychiatric: Affect normal.  Vitals reviewed.    Assessment/Plan: Essential hypertension Well-controlled.  Continue current regimen.   Atypical pigmented skin lesion Referral to Dermatology placed for further assessment and biopsy giving nonhealing lesion.   Need for prophylactic vaccination  with combined diphtheria-tetanus-pertussis (DTP) vaccine Vaccine given by nursing staff.   Need for vaccination with 13-polyvalent pneumococcal conjugate vaccine Vaccine given by nursing staff.

## 2014-05-03 NOTE — Patient Instructions (Signed)
Please continue your medications as directed. Follow-up with urologist as scheduled. I want you to have skin lesion on your face checked out by dermatologist. I have set up a referral for you.  You will be contacted for an appointment.  Follow-up when due for a complete physical.

## 2014-05-06 ENCOUNTER — Other Ambulatory Visit: Payer: Self-pay

## 2014-05-06 ENCOUNTER — Telehealth: Payer: Self-pay | Admitting: Physician Assistant

## 2014-05-06 MED ORDER — DILTIAZEM HCL ER COATED BEADS 180 MG PO CP24: 180.0000 mg | ORAL_CAPSULE | Freq: Every day | ORAL | Status: DC

## 2014-05-06 NOTE — Telephone Encounter (Signed)
Patient gets this medication from cardiology.  Left voicemail to notify patient.

## 2014-05-06 NOTE — Telephone Encounter (Signed)
Caller name: deivi Relation to pt: self Call back number: [phone] Pharmacy: [facility]  Reason for call:   Requesting cardizem refill. He is out.

## 2014-05-06 NOTE — Telephone Encounter (Signed)
Notified patient and he stated understanding.

## 2014-05-07 ENCOUNTER — Telehealth: Payer: Self-pay | Admitting: Cardiology

## 2014-05-07 DIAGNOSIS — L821 Other seborrheic keratosis: Secondary | ICD-10-CM | POA: Diagnosis not present

## 2014-05-07 DIAGNOSIS — D225 Melanocytic nevi of trunk: Secondary | ICD-10-CM | POA: Diagnosis not present

## 2014-05-07 NOTE — Telephone Encounter (Signed)
Per Answering Service: Pt needs a script called in,the medicine was not named.

## 2014-05-09 DIAGNOSIS — Z23 Encounter for immunization: Secondary | ICD-10-CM | POA: Insufficient documentation

## 2014-05-09 DIAGNOSIS — L819 Disorder of pigmentation, unspecified: Secondary | ICD-10-CM | POA: Insufficient documentation

## 2014-05-09 NOTE — Assessment & Plan Note (Signed)
Vaccine given by nursing staff.

## 2014-05-09 NOTE — Assessment & Plan Note (Signed)
Referral to Dermatology placed for further assessment and biopsy giving nonhealing lesion.

## 2014-05-09 NOTE — Assessment & Plan Note (Signed)
Well-controlled.  Continue current regimen. 

## 2014-05-13 ENCOUNTER — Other Ambulatory Visit (HOSPITAL_COMMUNITY): Payer: Self-pay | Admitting: Urology

## 2014-05-13 DIAGNOSIS — D49519 Neoplasm of unspecified behavior of unspecified kidney: Secondary | ICD-10-CM

## 2014-05-21 ENCOUNTER — Other Ambulatory Visit: Payer: Self-pay | Admitting: *Deleted

## 2014-05-21 MED ORDER — DILTIAZEM HCL ER COATED BEADS 180 MG PO CP24: 180.0000 mg | ORAL_CAPSULE | Freq: Every day | ORAL | Status: DC

## 2014-05-24 ENCOUNTER — Other Ambulatory Visit: Payer: Self-pay | Admitting: *Deleted

## 2014-05-24 MED ORDER — DILTIAZEM HCL ER COATED BEADS 180 MG PO CP24: 180.0000 mg | ORAL_CAPSULE | Freq: Every day | ORAL | Status: DC

## 2014-05-31 ENCOUNTER — Other Ambulatory Visit: Payer: Self-pay | Admitting: Physician Assistant

## 2014-05-31 ENCOUNTER — Telehealth: Payer: Self-pay | Admitting: Physician Assistant

## 2014-05-31 ENCOUNTER — Telehealth: Payer: Self-pay | Admitting: Cardiology

## 2014-05-31 MED ORDER — HYDROCODONE-ACETAMINOPHEN 10-325 MG PO TABS: 1.0000 | ORAL_TABLET | Freq: Four times a day (QID) | ORAL | Status: DC | PRN

## 2014-05-31 NOTE — Telephone Encounter (Signed)
Refill granted.  Rx at front desk for pickup.

## 2014-05-31 NOTE — Telephone Encounter (Signed)
Caller name: yeiden Relation to pt: self Call back number: [phone] Pharmacy:  Reason for call:   Requesting norco refill

## 2014-05-31 NOTE — Telephone Encounter (Signed)
Pt would like some samples of Eliquis please. °

## 2014-05-31 NOTE — Telephone Encounter (Signed)
Informed patient wife of this. °

## 2014-06-01 NOTE — Telephone Encounter (Signed)
Informed patient no samples available Offered to call prescription into the pharmacy Patient declined. Placed medication on patient's current medication list. it was removed at 12/2013 office visit.

## 2014-06-03 ENCOUNTER — Telehealth: Payer: Self-pay | Admitting: Cardiology

## 2014-06-03 MED ORDER — APIXABAN 5 MG PO TABS: 5.0000 mg | ORAL_TABLET | Freq: Two times a day (BID) | ORAL | Status: DC

## 2014-06-03 NOTE — Telephone Encounter (Signed)
Medication Samples have been provided to the patient.  Drug name: Eliquis  Qty: 4 box/56tab  LOT: [id number]  Exp.Date: 5/18  Patient notified.    [name] Woods 11:40 AM 06/03/2014

## 2014-06-03 NOTE — Telephone Encounter (Signed)
Pt would like some samples of Eliquis please. °

## 2014-06-24 ENCOUNTER — Encounter: Payer: Self-pay | Admitting: Medical

## 2014-06-24 ENCOUNTER — Ambulatory Visit (INDEPENDENT_AMBULATORY_CARE_PROVIDER_SITE_OTHER): Payer: Medicare Other | Admitting: Medical

## 2014-06-24 VITALS — BP 135/84 | HR 78 | Temp 98.0°F | Wt 218.0 lb

## 2014-06-24 DIAGNOSIS — Z8639 Personal history of other endocrine, nutritional and metabolic disease: Secondary | ICD-10-CM

## 2014-06-24 DIAGNOSIS — M25579 Pain in unspecified ankle and joints of unspecified foot: Secondary | ICD-10-CM

## 2014-06-24 DIAGNOSIS — Z8739 Personal history of other diseases of the musculoskeletal system and connective tissue: Secondary | ICD-10-CM

## 2014-06-24 LAB — URIC ACID: Uric Acid, Serum: 9.4 mg/dL — ABNORMAL HIGH (ref 4.0–7.8)

## 2014-06-24 LAB — COMPREHENSIVE METABOLIC PANEL
ALBUMIN: 4 g/dL (ref 3.5–5.2)
ALT: 11 U/L (ref 0–53)
AST: 16 U/L (ref 0–37)
Alkaline Phosphatase: 62 U/L (ref 39–117)
BUN: 15 mg/dL (ref 6–23)
CO2: 31 meq/L (ref 19–32)
Calcium: 9.5 mg/dL (ref 8.4–10.5)
Chloride: 103 mEq/L (ref 96–112)
Creatinine, Ser: 1.46 mg/dL (ref 0.40–1.50)
GFR: 50.25 mL/min — ABNORMAL LOW (ref >60.00)
Glucose, Bld: 88 mg/dL (ref 70–99)
Potassium: 3.3 mEq/L — ABNORMAL LOW (ref 3.5–5.1)
SODIUM: 142 meq/L (ref 135–145)
Total Bilirubin: 2.4 mg/dL — ABNORMAL HIGH (ref 0.2–1.2)
Total Protein: 7 g/dL (ref 6.0–8.3)

## 2014-06-24 LAB — CBC WITH DIFFERENTIAL/PLATELET
BASOS PCT: 0.4 % (ref 0.0–3.0)
Basophils Absolute: 0 10*3/uL (ref 0.0–0.1)
EOS ABS: 0.3 10*3/uL (ref 0.0–0.7)
Eosinophils Relative: 2.6 % (ref 0.0–5.0)
HCT: 50.5 % (ref 39.0–52.0)
Hemoglobin: 16.8 g/dL (ref 13.0–17.0)
LYMPHS ABS: 2.2 10*3/uL (ref 0.7–4.0)
Lymphocytes Relative: 19.7 % (ref 12.0–46.0)
MCHC: 33.3 g/dL (ref 30.0–36.0)
MCV: 86.3 fl (ref 78.0–100.0)
Monocytes Absolute: 1.1 10*3/uL — ABNORMAL HIGH (ref 0.1–1.0)
Monocytes Relative: 9.6 % (ref 3.0–12.0)
NEUTROS ABS: 7.4 10*3/uL (ref 1.4–7.7)
Neutrophils Relative %: 67.7 % (ref 43.0–77.0)
PLATELETS: 228 10*3/uL (ref 150.0–400.0)
RBC: 5.86 Mil/uL — AB (ref 4.22–5.81)
RDW: 14.6 % (ref 11.5–15.5)
WBC: 11 10*3/uL — ABNORMAL HIGH (ref 4.0–10.5)

## 2014-06-24 MED ORDER — PREDNISONE 20 MG PO TABS: ORAL_TABLET | ORAL | Status: DC

## 2014-06-24 NOTE — Assessment & Plan Note (Signed)
Foot pain bilateral. Worse lt side. Likely gout. Will get cbc, uric acid and cmp. Approaching long weekend. Will rx taper dose of prednisone. Pt has hydrocodone for pain.   If kidney function good and uric acid elevated. Consider allopurinol in near future. Will follow labs.  In event worsening or changing symtoms over long holiday weekend then ED evaluation.  Do want to get xray of left foot today.

## 2014-06-24 NOTE — Patient Instructions (Signed)
Pain in joint, ankle and foot Foot pain bilateral. Worse lt side. Likely gout. Will get cbc, uric acid and cmp. Approaching long weekend. Will rx taper dose of prednisone. Pt has hydrocodone for pain.   If kidney function good and uric acid elevated. Consider allopurinol in near future. Will follow labs.  In event worsening or changing symtoms over long holiday weekend then ED evaluation.  Do want to get xray of left foot today.    Folllow up 1 wk or as needed. Update [place] on Tuesday am how you are.

## 2014-06-24 NOTE — Progress Notes (Signed)
   Subjective:    Patient ID: [name] Woods, male    DOB: 1940-12-02, 74 y.o.   MRN: [id number]  HPI  Pt in with bilateral feet pain. Lt side hurts worse. No injury or fall. Pt states hx of gout. Pt used to be on prednisone for occasional flare of gout.   Pt had old prescription of prednisone had old prescription of 20 mg prednisone tabs. Pt used his own tapering dose over past 3 wks. He states started at 20 mg then took halfs then quarter of tab. Then last week he ran out of tabs last week. Then on Monday pain in feet flared again.  Uric acid in 2013 was 5.3. But 3 years ago uric acid was 8.7.  No fevers or chills. Pt does not drink alcohol. Admits to eating red meat.  Pt not diabetic.    Review of Systems  See hpi.     Objective:   Physical Exam  General- No acute distress. Pleasant patient. Neck- Full range of motion, no jvd Lungs- Clear, even and unlabored. Heart- regular rate and rhythm. Neurologic- CNII- XII grossly intact. Lt foot- faint mild swelling diffusely. Faint warmth and tender. Rt great toe tender most at base. Rt achilles tendon area mild tender. Rt foot- faint tender. But no redness, no warmth or swelling.      Assessment & Plan:  Note today did review his last echo and ct of chest. I don't see any recent reports of acute chf.

## 2014-06-24 NOTE — Progress Notes (Signed)
Pre visit review using our clinic review tool, if applicable. No additional management support is needed unless otherwise documented below in the visit note. 

## 2014-06-29 ENCOUNTER — Ambulatory Visit (HOSPITAL_COMMUNITY)
Admission: RE | Admit: 2014-06-29 | Discharge: 2014-06-29 | Disposition: A | Discharge status: Home / Self Care | Payer: Medicare Other | Source: Ambulatory Visit | Attending: Urology | Admitting: Urology

## 2014-06-29 DIAGNOSIS — D49519 Neoplasm of unspecified behavior of unspecified kidney: Secondary | ICD-10-CM

## 2014-06-29 DIAGNOSIS — R319 Hematuria, unspecified: Secondary | ICD-10-CM | POA: Diagnosis not present

## 2014-06-29 DIAGNOSIS — N281 Cyst of kidney, acquired: Secondary | ICD-10-CM | POA: Diagnosis not present

## 2014-06-29 DIAGNOSIS — K7689 Other specified diseases of liver: Secondary | ICD-10-CM | POA: Diagnosis not present

## 2014-06-29 DIAGNOSIS — K5710 Diverticulosis of small intestine without perforation or abscess without bleeding: Secondary | ICD-10-CM | POA: Diagnosis not present

## 2014-06-29 DIAGNOSIS — N201 Calculus of ureter: Secondary | ICD-10-CM | POA: Diagnosis not present

## 2014-06-29 MED ORDER — GADOBENATE DIMEGLUMINE 529 MG/ML IV SOLN
20.0000 mL | Freq: Once | INTRAVENOUS | Status: AC | PRN
  Administered 2014-06-29: 20 mL via INTRAVENOUS

## 2014-06-30 ENCOUNTER — Encounter: Payer: Self-pay | Admitting: Physician Assistant

## 2014-06-30 ENCOUNTER — Ambulatory Visit (INDEPENDENT_AMBULATORY_CARE_PROVIDER_SITE_OTHER): Payer: Medicare Other | Admitting: Physician Assistant

## 2014-06-30 VITALS — BP 153/79 | HR 85 | Temp 97.7°F | Ht 72.0 in | Wt 219.2 lb

## 2014-06-30 DIAGNOSIS — R17 Unspecified jaundice: Secondary | ICD-10-CM | POA: Diagnosis not present

## 2014-06-30 DIAGNOSIS — M10079 Idiopathic gout, unspecified ankle and foot: Secondary | ICD-10-CM | POA: Diagnosis not present

## 2014-06-30 DIAGNOSIS — M109 Gout, unspecified: Secondary | ICD-10-CM | POA: Insufficient documentation

## 2014-06-30 LAB — COMPREHENSIVE METABOLIC PANEL
ALT: 22 U/L (ref 0–53)
AST: 25 U/L (ref 0–37)
Albumin: 3.7 g/dL (ref 3.5–5.2)
Alkaline Phosphatase: 54 U/L (ref 39–117)
BILIRUBIN TOTAL: 1.3 mg/dL — AB (ref 0.2–1.2)
BUN: 19 mg/dL (ref 6–23)
CO2: 29 mEq/L (ref 19–32)
CREATININE: 1.33 mg/dL (ref 0.40–1.50)
Calcium: 9 mg/dL (ref 8.4–10.5)
Chloride: 105 mEq/L (ref 96–112)
GFR: 55.96 mL/min — ABNORMAL LOW (ref >60.00)
Glucose, Bld: 123 mg/dL — ABNORMAL HIGH (ref 70–99)
POTASSIUM: 3.3 meq/L — AB (ref 3.5–5.1)
Sodium: 142 mEq/L (ref 135–145)
Total Protein: 6.6 g/dL (ref 6.0–8.3)

## 2014-06-30 MED ORDER — COLCHICINE 0.6 MG PO TABS: ORAL_TABLET | ORAL | Status: DC

## 2014-06-30 MED ORDER — HYDROCODONE-ACETAMINOPHEN 10-325 MG PO TABS: 1.0000 | ORAL_TABLET | Freq: Four times a day (QID) | ORAL | Status: DC | PRN

## 2014-06-30 NOTE — Progress Notes (Signed)
Pre visit review using our clinic review tool, if applicable. No additional management support is needed unless otherwise documented below in the visit note. 

## 2014-06-30 NOTE — Patient Instructions (Addendum)
Please go to the lab for repeat blood work. I will call you with your results. Take colchicine once daily over the next week.  Avoid red meats, tomato-based foods and seafood.

## 2014-06-30 NOTE — Progress Notes (Signed)
Patient presents to clinic today for follow-up of acute gout attack.  Patient endorses taking prednisone as directed.  Endorses some improvement in symptoms but still having mild residual pain in R MTP2. Creatinine noted to be elevated at 1.4. Potassium at 3.3.  Bilirubin at 2.4 which was a new finding for patient.  Denies abdominal pain, darkening urine, etc.   Past Medical History  Diagnosis Date  . Ventricular hypertrophy   . Persistent atrial fibrillation   . Hiatal hernia   . 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 Cholelithiasis   . Sleep apnea   . Nephrolithiasis   . Arthritis   . Hypertension   . Ankylosing spondylitis   . GERD (gastroesophageal reflux disease)   . CAD (coronary artery disease) 2440,1027    30% mid LAD lesion on cardiac cath  . Chicken pox   . [place] measles     Current Outpatient Prescriptions on File Prior to Visit  Medication Sig Dispense Refill  . amLODipine (NORVASC) 5 MG tablet Take 1 tablet (5 mg total) by mouth daily. 30 tablet 5  . apixaban (ELIQUIS) 5 MG TABS tablet Take 1 tablet (5 mg total) by mouth 2 (two) times daily. 56 tablet 0  . diltiazem (CARDIZEM CD) 180 MG 24 hr capsule Take 1 capsule (180 mg total) by mouth daily. 15 capsule 0  . furosemide (LASIX) 20 MG tablet Take 1 tablet (20 mg total) by mouth daily. 30 tablet 5  . Loperamide HCl (IMODIUM PO) Take by mouth as needed.      . metoprolol succinate (TOPROL-XL) 100 MG 24 hr tablet TAKE 1 TABLET BY MOUTH 2 TIMES DAILY. 60 tablet 5  . tadalafil (CIALIS) 5 MG tablet TAKE 1 TABLET (5 MG TOTAL) BY MOUTH DAILY AS NEEDED FOR ERECTILE DYSFUNCTION. (Patient not taking: Reported on 06/30/2014) 10 tablet 0   No current facility-administered medications on file prior to visit.    Allergies  Allergen Reactions  . Coumadin [Warfarin]     GI bleeds    Family History  Problem Relation Age of Onset  . Colon cancer Neg Hx   . Heart disease Father 60    Deceased  . Heart attack Father 61  .  Heart disease Brother 98    "Died in his sleep"  . Arthritis/Rheumatoid Mother     Deceased-86  . Dementia Paternal Grandmother   . Cancer Paternal Uncle   . Hyperlipidemia Brother   . Stroke Brother 7    Deceased  . Other Sister     Estate agent in [place]  . Other Sister     MVA  . Other Brother     Carjacked-killed  . Healthy Brother   . Healthy Son     X3  . Healthy Daughter     x1    History   Social History  . Marital Status: Married    Spouse Name: N/A  . Number of Children: 4  . Years of Education: N/A   Occupational History  . Retired     Estate manager/land agent   Social History Main Topics  . Smoking status: Never Smoker   . Smokeless tobacco: Never Used  . Alcohol Use: No  . Drug Use: No  . Sexual Activity: Not on file   Other Topics Concern  . None   Social History Narrative   4 caffeine drinks daily    Review of Systems - See HPI.  All other ROS are negative.  BP 153/79  mmHg  Pulse 85  Temp(Src) 97.7 F (36.5 C) (Oral)  Ht 6' (1.829 m)  Wt 219 lb 3.2 oz (99.428 kg)  BMI 29.72 kg/m2  SpO2 99%  Physical Exam  Constitutional: He is oriented to person, place, and time and well-developed, well-nourished, and in no distress.  HENT:  Head: Normocephalic and atraumatic.  Cardiovascular: Normal rate, regular rhythm, normal heart sounds and intact distal pulses.   Pulmonary/Chest: Effort normal and breath sounds normal. No respiratory distress. He has no wheezes. He has no rales. He exhibits no tenderness.  Neurological: He is alert and oriented to person, place, and time.  Skin: Skin is warm and dry. No rash noted.  Psychiatric: Affect normal.    Recent Results (from the past 2160 hour(s))  CBC w/Diff     Status: Abnormal   Collection Time: 06/24/14  2:21 PM  Result Value Ref Range   WBC 11.0 (H) 4.0 - 10.5 K/uL   RBC 5.86 (H) 4.22 - 5.81 Mil/uL   Hemoglobin 16.8 13.0 - 17.0 g/dL   HCT 50.5 39.0 - 52.0 %   MCV 86.3 78.0 - 100.0 fl   MCHC 33.3 30.0 -  36.0 g/dL   RDW 14.6 11.5 - 15.5 %   Platelets 228.0 150.0 - 400.0 K/uL   Neutrophils Relative % 67.7 43.0 - 77.0 %   Lymphocytes Relative 19.7 12.0 - 46.0 %   Monocytes Relative 9.6 3.0 - 12.0 %   Eosinophils Relative 2.6 0.0 - 5.0 %   Basophils Relative 0.4 0.0 - 3.0 %   Neutro Abs 7.4 1.4 - 7.7 K/uL   Lymphs Abs 2.2 0.7 - 4.0 K/uL   Monocytes Absolute 1.1 (H) 0.1 - 1.0 K/uL   Eosinophils Absolute 0.3 0.0 - 0.7 K/uL   Basophils Absolute 0.0 0.0 - 0.1 K/uL  Uric acid     Status: Abnormal   Collection Time: 06/24/14  2:21 PM  Result Value Ref Range   Uric Acid, Serum 9.4 (H) 4.0 - 7.8 mg/dL  Comprehensive metabolic panel     Status: Abnormal   Collection Time: 06/24/14  2:21 PM  Result Value Ref Range   Sodium 142 135 - 145 mEq/L   Potassium 3.3 (L) 3.5 - 5.1 mEq/L   Chloride 103 96 - 112 mEq/L   CO2 31 19 - 32 mEq/L   Glucose, Bld 88 70 - 99 mg/dL   BUN 15 6 - 23 mg/dL   Creatinine, Ser 1.46 0.40 - 1.50 mg/dL   Total Bilirubin 2.4 (H) 0.2 - 1.2 mg/dL   Alkaline Phosphatase 62 39 - 117 U/L   AST 16 0 - 37 U/L   ALT 11 0 - 53 U/L   Total Protein 7.0 6.0 - 8.3 g/dL   Albumin 4.0 3.5 - 5.2 g/dL   Calcium 9.5 8.4 - 10.5 mg/dL   GFR 50.25 (L) >60.00 mL/min    Assessment/Plan: Gout Resolving. Will start Colchicine 0.6 once daily to help resolve attack.  Will attempt to switch to Uloric after flare has completely resolved.  Follow-up in 1 week.   Elevated bilirubin Asymptomatic.  Repeat CMP today.

## 2014-06-30 NOTE — Assessment & Plan Note (Signed)
Resolving. Will start Colchicine 0.6 once daily to help resolve attack.  Will attempt to switch to Uloric after flare has completely resolved.  Follow-up in 1 week.

## 2014-06-30 NOTE — Assessment & Plan Note (Signed)
Asymptomatic.  Repeat CMP today.

## 2014-07-01 ENCOUNTER — Telehealth: Payer: Self-pay | Admitting: *Deleted

## 2014-07-01 NOTE — Telephone Encounter (Signed)
Eliquis samples placed at the front desk for patient. 

## 2014-07-02 ENCOUNTER — Telehealth: Payer: Self-pay | Admitting: *Deleted

## 2014-07-02 MED ORDER — POTASSIUM CHLORIDE CRYS ER 20 MEQ PO TBCR: 20.0000 meq | EXTENDED_RELEASE_TABLET | Freq: Every day | ORAL | Status: DC

## 2014-07-02 NOTE — Telephone Encounter (Signed)
Called and spoke with the pt and informed him of recent lab results and note below.  Pt verbalized understanding and agreed to starting the potassium.  New prescription sent to the pharmacy by e-script.//AB/CMA

## 2014-07-02 NOTE — Telephone Encounter (Signed)
-----   Message from Brunetta Jeans, PA-C sent at 06/30/2014  4:44 PM EDT ----- Bilirubin improved which is good.  Potassium still low at 3.3.  Need to start potassium supplement.  Please send in Rx for Klor-Con 20 mEq once daily.  Follow-up as scheduled.

## 2014-07-06 ENCOUNTER — Telehealth: Payer: Self-pay | Admitting: Cardiology

## 2014-07-06 MED ORDER — APIXABAN 5 MG PO TABS: 5.0000 mg | ORAL_TABLET | Freq: Two times a day (BID) | ORAL | Status: DC

## 2014-07-06 NOTE — Telephone Encounter (Signed)
Pt called in requesting some samples of Eliquis 2.5mg . Please call  Thanks

## 2014-07-06 NOTE — Telephone Encounter (Signed)
Samples x 2---5 MG  available to pick up  PATIENT IS TAKING 5 MG OF ELQUIS NOT 2.5MG ---

## 2014-07-08 DIAGNOSIS — N200 Calculus of kidney: Secondary | ICD-10-CM | POA: Diagnosis not present

## 2014-07-08 DIAGNOSIS — N281 Cyst of kidney, acquired: Secondary | ICD-10-CM | POA: Diagnosis not present

## 2014-07-21 ENCOUNTER — Telehealth: Payer: Self-pay | Admitting: Cardiology

## 2014-07-21 MED ORDER — APIXABAN 5 MG PO TABS: 5.0000 mg | ORAL_TABLET | Freq: Two times a day (BID) | ORAL | Status: DC

## 2014-07-21 NOTE — Telephone Encounter (Signed)
Pt would like some samples of Eliquis please. Pt have 3 pills left.

## 2014-07-21 NOTE — Telephone Encounter (Signed)
LEFT MESSAGE ON VOICEMAIL   SAMPLLE  X1 AVAILABLE . MAY PICK UP AT FRONT DESK   CALL IF MEDICATION NEED TO SENT TO PHARMACY.

## 2014-07-27 ENCOUNTER — Ambulatory Visit (INDEPENDENT_AMBULATORY_CARE_PROVIDER_SITE_OTHER): Payer: Medicare Other | Admitting: Physician Assistant

## 2014-07-27 ENCOUNTER — Encounter: Payer: Self-pay | Admitting: Physician Assistant

## 2014-07-27 VITALS — BP 134/90 | HR 72 | Temp 97.7°F | Ht 72.0 in | Wt 217.8 lb

## 2014-07-27 DIAGNOSIS — L255 Unspecified contact dermatitis due to plants, except food: Secondary | ICD-10-CM | POA: Diagnosis not present

## 2014-07-27 DIAGNOSIS — R0982 Postnasal drip: Secondary | ICD-10-CM | POA: Diagnosis not present

## 2014-07-27 MED ORDER — CLOBETASOL PROPIONATE 0.05 % EX CREA: 1.0000 "application " | TOPICAL_CREAM | Freq: Two times a day (BID) | CUTANEOUS | Status: DC

## 2014-07-27 MED ORDER — BENZONATATE 200 MG PO CAPS: 200.0000 mg | ORAL_CAPSULE | Freq: Two times a day (BID) | ORAL | Status: DC | PRN

## 2014-07-27 MED ORDER — HYDROCODONE-ACETAMINOPHEN 10-325 MG PO TABS: 1.0000 | ORAL_TABLET | Freq: Four times a day (QID) | ORAL | Status: DC | PRN

## 2014-07-27 MED ORDER — FLUTICASONE PROPIONATE 50 MCG/ACT NA SUSP: 2.0000 | Freq: Every day | NASAL | Status: DC

## 2014-07-27 NOTE — Progress Notes (Signed)
Patient presents to clinic today c/o 1 week of dry cough with sputum production that is clear. Denies fever, chills, SOB or chest pain. Endorses sinus drainage over the past week.  Denies sinus pain or headaches.    Patient also complains of itchy rash of left upper arm after weeding the other day.  Has + history of poison ivy reaction. Denies rash elsewhere.  Past Medical History  Diagnosis Date  . Ventricular hypertrophy   . Persistent atrial fibrillation   . Hiatal hernia   . 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 Cholelithiasis   . Sleep apnea   . Nephrolithiasis   . Arthritis   . Hypertension   . Ankylosing spondylitis   . GERD (gastroesophageal reflux disease)   . CAD (coronary artery disease) 4098,1191    30% mid LAD lesion on cardiac cath  . Chicken pox   . [place] measles     Current Outpatient Prescriptions on File Prior to Visit  Medication Sig Dispense Refill  . amLODipine (NORVASC) 5 MG tablet Take 1 tablet (5 mg total) by mouth daily. 30 tablet 5  . apixaban (ELIQUIS) 5 MG TABS tablet Take 1 tablet (5 mg total) by mouth 2 (two) times daily. 14 tablet 0  . colchicine 0.6 MG tablet Take colchicine once daily as directed. 10 tablet 1  . diltiazem (CARDIZEM CD) 180 MG 24 hr capsule Take 1 capsule (180 mg total) by mouth daily. 15 capsule 0  . furosemide (LASIX) 20 MG tablet Take 1 tablet (20 mg total) by mouth daily. 30 tablet 5  . Loperamide HCl (IMODIUM PO) Take by mouth as needed.      . metoprolol succinate (TOPROL-XL) 100 MG 24 hr tablet TAKE 1 TABLET BY MOUTH 2 TIMES DAILY. 60 tablet 5  . potassium chloride SA (K-DUR,KLOR-CON) 20 MEQ tablet Take 1 tablet (20 mEq total) by mouth daily. 30 tablet 3  . tadalafil (CIALIS) 5 MG tablet TAKE 1 TABLET (5 MG TOTAL) BY MOUTH DAILY AS NEEDED FOR ERECTILE DYSFUNCTION. (Patient not taking: Reported on 06/30/2014) 10 tablet 0   No current facility-administered medications on file prior to visit.    Allergies  Allergen Reactions    . Coumadin [Warfarin]     GI bleeds    Family History  Problem Relation Age of Onset  . Colon cancer Neg Hx   . Heart disease Father 83    Deceased  . Heart attack Father 20  . Heart disease Brother 47    "Died in his sleep"  . Arthritis/Rheumatoid Mother     Deceased-86  . Dementia Paternal Grandmother   . Cancer Paternal Uncle   . Hyperlipidemia Brother   . Stroke Brother 74    Deceased  . Other Sister     Estate agent in [place]  . Other Sister     MVA  . Other Brother     Carjacked-killed  . Healthy Brother   . Healthy Son     X3  . Healthy Daughter     x1    History   Social History  . Marital Status: Married    Spouse Name: N/A  . Number of Children: 4  . Years of Education: N/A   Occupational History  . Retired     Estate manager/land agent   Social History Main Topics  . Smoking status: Never Smoker   . Smokeless tobacco: Never Used  . Alcohol Use: No  . Drug Use: No  . Sexual Activity:  Not on file   Other Topics Concern  . None   Social History Narrative   4 caffeine drinks daily    Review of Systems - See HPI.  All other ROS are negative.  BP 134/90 mmHg  Pulse 72  Temp(Src) 97.7 F (36.5 C) (Oral)  Ht 6' (1.829 m)  Wt 217 lb 12.8 oz (98.793 kg)  BMI 29.53 kg/m2  SpO2 100%  Physical Exam  Constitutional: He is oriented to person, place, and time and well-developed, well-nourished, and in no distress.  HENT:  Head: Normocephalic and atraumatic.  Right Ear: External ear normal.  Left Ear: External ear normal.  Nose: Nose normal.  Mouth/Throat: Oropharynx is clear and moist. No oropharyngeal exudate.  PND noted on examination.  Eyes: Conjunctivae are normal.  Neck: Neck supple.  Cardiovascular: Normal rate, regular rhythm, normal heart sounds and intact distal pulses.   Pulmonary/Chest: Effort normal and breath sounds normal. No respiratory distress. He has no wheezes. He has no rales. He exhibits no tenderness.  Neurological: He is alert and  oriented to person, place, and time.  Skin: Skin is warm and dry. No rash noted.  Psychiatric: Affect normal.  Vitals reviewed.   Recent Results (from the past 2160 hour(s))  CBC w/Diff     Status: Abnormal   Collection Time: 06/24/14  2:21 PM  Result Value Ref Range   WBC 11.0 (H) 4.0 - 10.5 K/uL   RBC 5.86 (H) 4.22 - 5.81 Mil/uL   Hemoglobin 16.8 13.0 - 17.0 g/dL   HCT 50.5 39.0 - 52.0 %   MCV 86.3 78.0 - 100.0 fl   MCHC 33.3 30.0 - 36.0 g/dL   RDW 14.6 11.5 - 15.5 %   Platelets 228.0 150.0 - 400.0 K/uL   Neutrophils Relative % 67.7 43.0 - 77.0 %   Lymphocytes Relative 19.7 12.0 - 46.0 %   Monocytes Relative 9.6 3.0 - 12.0 %   Eosinophils Relative 2.6 0.0 - 5.0 %   Basophils Relative 0.4 0.0 - 3.0 %   Neutro Abs 7.4 1.4 - 7.7 K/uL   Lymphs Abs 2.2 0.7 - 4.0 K/uL   Monocytes Absolute 1.1 (H) 0.1 - 1.0 K/uL   Eosinophils Absolute 0.3 0.0 - 0.7 K/uL   Basophils Absolute 0.0 0.0 - 0.1 K/uL  Uric acid     Status: Abnormal   Collection Time: 06/24/14  2:21 PM  Result Value Ref Range   Uric Acid, Serum 9.4 (H) 4.0 - 7.8 mg/dL  Comprehensive metabolic panel     Status: Abnormal   Collection Time: 06/24/14  2:21 PM  Result Value Ref Range   Sodium 142 135 - 145 mEq/L   Potassium 3.3 (L) 3.5 - 5.1 mEq/L   Chloride 103 96 - 112 mEq/L   CO2 31 19 - 32 mEq/L   Glucose, Bld 88 70 - 99 mg/dL   BUN 15 6 - 23 mg/dL   Creatinine, Ser 1.46 0.40 - 1.50 mg/dL   Total Bilirubin 2.4 (H) 0.2 - 1.2 mg/dL   Alkaline Phosphatase 62 39 - 117 U/L   AST 16 0 - 37 U/L   ALT 11 0 - 53 U/L   Total Protein 7.0 6.0 - 8.3 g/dL   Albumin 4.0 3.5 - 5.2 g/dL   Calcium 9.5 8.4 - 10.5 mg/dL   GFR 50.25 (L) >60.00 mL/min  Comp Met (CMET)     Status: Abnormal   Collection Time: 06/30/14  2:01 PM  Result Value Ref Range  Sodium 142 135 - 145 mEq/L   Potassium 3.3 (L) 3.5 - 5.1 mEq/L   Chloride 105 96 - 112 mEq/L   CO2 29 19 - 32 mEq/L   Glucose, Bld 123 (H) 70 - 99 mg/dL   BUN 19 6 - 23 mg/dL    Creatinine, Ser 1.33 0.40 - 1.50 mg/dL   Total Bilirubin 1.3 (H) 0.2 - 1.2 mg/dL   Alkaline Phosphatase 54 39 - 117 U/L   AST 25 0 - 37 U/L   ALT 22 0 - 53 U/L   Total Protein 6.6 6.0 - 8.3 g/dL   Albumin 3.7 3.5 - 5.2 g/dL   Calcium 9.0 8.4 - 10.5 mg/dL   GFR 55.96 (L) >60.00 mL/min    Assessment/Plan: Post-nasal drip Causing cough. Saline nasal spray. Rx Flonase. Rx Tessalon for cough. Follow-up PRN if symptoms are not improving.  Rhus dermatitis Rx clobetasol cream to apply BID.

## 2014-07-27 NOTE — Patient Instructions (Signed)
Please use Flonase daily as directed. Stay well hydrated and use saline nasal spray. Place a humidifier in the bedroom.  Use Tessalon as directed for cough.  USe Clobetasol cream twice daily to rash to help itch and resolve the problem.  Call if symptoms are not resolving.

## 2014-07-27 NOTE — Assessment & Plan Note (Signed)
Causing cough. Saline nasal spray. Rx Flonase. Rx Tessalon for cough. Follow-up PRN if symptoms are not improving.

## 2014-07-27 NOTE — Progress Notes (Signed)
Pre visit review using our clinic review tool, if applicable. No additional management support is needed unless otherwise documented below in the visit note. 

## 2014-07-27 NOTE — Assessment & Plan Note (Signed)
Rx clobetasol cream to apply BID.

## 2014-07-28 ENCOUNTER — Ambulatory Visit (HOSPITAL_BASED_OUTPATIENT_CLINIC_OR_DEPARTMENT_OTHER)
Admission: RE | Admit: 2014-07-28 | Discharge: 2014-07-28 | Disposition: A | Discharge status: Home / Self Care | Payer: Medicare Other | Source: Ambulatory Visit | Attending: Physician Assistant | Admitting: Physician Assistant

## 2014-07-28 ENCOUNTER — Ambulatory Visit (INDEPENDENT_AMBULATORY_CARE_PROVIDER_SITE_OTHER): Payer: Medicare Other | Admitting: Physician Assistant

## 2014-07-28 ENCOUNTER — Encounter: Payer: Self-pay | Admitting: Physician Assistant

## 2014-07-28 ENCOUNTER — Telehealth: Payer: Self-pay | Admitting: Physician Assistant

## 2014-07-28 ENCOUNTER — Emergency Department (HOSPITAL_BASED_OUTPATIENT_CLINIC_OR_DEPARTMENT_OTHER)
Admission: EM | Admit: 2014-07-28 | Discharge: 2014-07-28 | Discharge status: Absent without leave | Payer: Medicare Other

## 2014-07-28 VITALS — BP 149/73 | HR 86 | Temp 98.1°F | Ht 72.0 in | Wt 215.6 lb

## 2014-07-28 DIAGNOSIS — J00 Acute nasopharyngitis [common cold]: Secondary | ICD-10-CM

## 2014-07-28 DIAGNOSIS — R05 Cough: Secondary | ICD-10-CM | POA: Diagnosis not present

## 2014-07-28 DIAGNOSIS — J439 Emphysema, unspecified: Secondary | ICD-10-CM | POA: Insufficient documentation

## 2014-07-28 DIAGNOSIS — J208 Acute bronchitis due to other specified organisms: Principal | ICD-10-CM

## 2014-07-28 DIAGNOSIS — I509 Heart failure, unspecified: Secondary | ICD-10-CM | POA: Insufficient documentation

## 2014-07-28 DIAGNOSIS — R059 Cough, unspecified: Secondary | ICD-10-CM

## 2014-07-28 DIAGNOSIS — B9689 Other specified bacterial agents as the cause of diseases classified elsewhere: Secondary | ICD-10-CM | POA: Insufficient documentation

## 2014-07-28 MED ORDER — TRIAMCINOLONE ACETONIDE 0.1 % EX CREA: 1.0000 "application " | TOPICAL_CREAM | Freq: Two times a day (BID) | CUTANEOUS | Status: DC

## 2014-07-28 MED ORDER — DOXYCYCLINE HYCLATE 50 MG PO CAPS: 50.0000 mg | ORAL_CAPSULE | Freq: Two times a day (BID) | ORAL | Status: DC

## 2014-07-28 NOTE — Assessment & Plan Note (Signed)
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 Patient denies history. Is former smoker. O2 99% on room air with acute bronchitis. Referral to pulmonology placed for assessment and LFTs.

## 2014-07-28 NOTE — Assessment & Plan Note (Signed)
Rx doxycycline. Increase fluids. Continue care discussed at yesterday's visit. Continue Tessalon perles. Follow-up if not resolving.

## 2014-07-28 NOTE — Progress Notes (Signed)
Pre visit review using our clinic review tool, if applicable. No additional management support is needed unless otherwise documented below in the visit note. 

## 2014-07-28 NOTE — Telephone Encounter (Signed)
Can add on Mucinex-DM every 12 hours to break up mucous. Call if symptoms are not improving over the next few days.

## 2014-07-28 NOTE — Patient Instructions (Signed)
Please use cream as directed.  Please start antibiotic and take twice daily until all tablets are gone.  Stay well hydrated and continue care discussed at visit.  Again your CXR was negative for pneumonia or CHF exacerbation.  Does show long-term changes from possible COPD. You will be contacted by a Pulmonologist for further assessment.

## 2014-07-28 NOTE — Telephone Encounter (Signed)
Caller name: Huriel Matt Relationship to patient:  wife Can be reached: [phone] Pharmacy: [facility], [place]  Reason for call: Pt was in yesterday and taking the cough syrup and flonase. Pt was up all night coughing up thick yellow mucus. He is exhausted and wondering if he needs something else.

## 2014-07-28 NOTE — Progress Notes (Signed)
Patient presents to clinic today seen yesterday for PND and cough. Patient states he started productive cough last night that has progressively thickened and darkened. Denies chest pain or SOB. Wife was concerned about CHF and had him schedule appointment today.  Denies swelling, PND or orthopnea. Denies fever, chills or malaise. STAT CXR was obtained and negative for pneumonia or signs of CHF. Some chronic changes consistent with emphysema noted.  Past Medical History  Diagnosis Date  . Ventricular hypertrophy   . Persistent atrial fibrillation   . Hiatal hernia   . 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 Cholelithiasis   . Sleep apnea   . Nephrolithiasis   . Arthritis   . Hypertension   . Ankylosing spondylitis   . GERD (gastroesophageal reflux disease)   . CAD (coronary artery disease) 2751,7001    30% mid LAD lesion on cardiac cath  . Chicken pox   . [place] measles     Current Outpatient Prescriptions on File Prior to Visit  Medication Sig Dispense Refill  . amLODipine (NORVASC) 5 MG tablet Take 1 tablet (5 mg total) by mouth daily. 30 tablet 5  . apixaban (ELIQUIS) 5 MG TABS tablet Take 1 tablet (5 mg total) by mouth 2 (two) times daily. 14 tablet 0  . benzonatate (TESSALON) 200 MG capsule Take 1 capsule (200 mg total) by mouth 2 (two) times daily as needed for cough. 20 capsule 0  . clobetasol cream (TEMOVATE) 7.49 % Apply 1 application topically 2 (two) times daily. 30 g 0  . colchicine 0.6 MG tablet Take colchicine once daily as directed. 10 tablet 1  . diltiazem (CARDIZEM CD) 180 MG 24 hr capsule Take 1 capsule (180 mg total) by mouth daily. 15 capsule 0  . fluticasone (FLONASE) 50 MCG/ACT nasal spray Place 2 sprays into both nostrils daily. 16 g 6  . furosemide (LASIX) 20 MG tablet Take 1 tablet (20 mg total) by mouth daily. 30 tablet 5  . HYDROcodone-acetaminophen (NORCO) 10-325 MG per tablet Take 1 tablet by mouth every 6 (six) hours as needed. 120 tablet 0  . Loperamide  HCl (IMODIUM PO) Take by mouth as needed.      . metoprolol succinate (TOPROL-XL) 100 MG 24 hr tablet TAKE 1 TABLET BY MOUTH 2 TIMES DAILY. 60 tablet 5  . potassium chloride SA (K-DUR,KLOR-CON) 20 MEQ tablet Take 1 tablet (20 mEq total) by mouth daily. 30 tablet 3   No current facility-administered medications on file prior to visit.    Allergies  Allergen Reactions  . Coumadin [Warfarin]     GI bleeds    Family History  Problem Relation Age of Onset  . Colon cancer Neg Hx   . Heart disease Father 74    Deceased  . Heart attack Father 73  . Heart disease Brother 61    "Died in his sleep"  . Arthritis/Rheumatoid Mother     Deceased-86  . Dementia Paternal Grandmother   . Cancer Paternal Uncle   . Hyperlipidemia Brother   . Stroke Brother 75    Deceased  . Other Sister     Estate agent in [place]  . Other Sister     MVA  . Other Brother     Carjacked-killed  . Healthy Brother   . Healthy Son     X3  . Healthy Daughter     x1    History   Social History  . Marital Status: Married    Spouse Name: N/A  .  Number of Children: 4  . Years of Education: N/A   Occupational History  . Retired     Estate manager/land agent   Social History Main Topics  . Smoking status: Never Smoker   . Smokeless tobacco: Never Used  . Alcohol Use: No  . Drug Use: No  . Sexual Activity: Not on file   Other Topics Concern  . None   Social History Narrative   4 caffeine drinks daily     Review of Systems - See HPI.  All other ROS are negative.  BP 149/73 mmHg  Pulse 86  Temp(Src) 98.1 F (36.7 C) (Oral)  Ht 6' (1.829 m)  Wt 215 lb 9.6 oz (97.796 kg)  BMI 29.23 kg/m2  SpO2 99%  Physical Exam  Constitutional: He is oriented to person, place, and time and well-developed, well-nourished, and in no distress.  HENT:  Head: Normocephalic and atraumatic.  Eyes: Conjunctivae are normal.  Neck: Neck supple.  Cardiovascular: Normal rate, regular rhythm, normal heart sounds and intact distal  pulses.   Pulmonary/Chest: Effort normal and breath sounds normal. No respiratory distress. He has no wheezes. He has no rales. He exhibits no tenderness.  Lymphadenopathy:    He has no cervical adenopathy.  Neurological: He is alert and oriented to person, place, and time.  Skin: Skin is warm and dry. No rash noted.  Psychiatric: Affect normal.  Vitals reviewed.   Recent Results (from the past 2160 hour(s))  CBC w/Diff     Status: Abnormal   Collection Time: 06/24/14  2:21 PM  Result Value Ref Range   WBC 11.0 (H) 4.0 - 10.5 K/uL   RBC 5.86 (H) 4.22 - 5.81 Mil/uL   Hemoglobin 16.8 13.0 - 17.0 g/dL   HCT 50.5 39.0 - 52.0 %   MCV 86.3 78.0 - 100.0 fl   MCHC 33.3 30.0 - 36.0 g/dL   RDW 14.6 11.5 - 15.5 %   Platelets 228.0 150.0 - 400.0 K/uL   Neutrophils Relative % 67.7 43.0 - 77.0 %   Lymphocytes Relative 19.7 12.0 - 46.0 %   Monocytes Relative 9.6 3.0 - 12.0 %   Eosinophils Relative 2.6 0.0 - 5.0 %   Basophils Relative 0.4 0.0 - 3.0 %   Neutro Abs 7.4 1.4 - 7.7 K/uL   Lymphs Abs 2.2 0.7 - 4.0 K/uL   Monocytes Absolute 1.1 (H) 0.1 - 1.0 K/uL   Eosinophils Absolute 0.3 0.0 - 0.7 K/uL   Basophils Absolute 0.0 0.0 - 0.1 K/uL  Uric acid     Status: Abnormal   Collection Time: 06/24/14  2:21 PM  Result Value Ref Range   Uric Acid, Serum 9.4 (H) 4.0 - 7.8 mg/dL  Comprehensive metabolic panel     Status: Abnormal   Collection Time: 06/24/14  2:21 PM  Result Value Ref Range   Sodium 142 135 - 145 mEq/L   Potassium 3.3 (L) 3.5 - 5.1 mEq/L   Chloride 103 96 - 112 mEq/L   CO2 31 19 - 32 mEq/L   Glucose, Bld 88 70 - 99 mg/dL   BUN 15 6 - 23 mg/dL   Creatinine, Ser 1.46 0.40 - 1.50 mg/dL   Total Bilirubin 2.4 (H) 0.2 - 1.2 mg/dL   Alkaline Phosphatase 62 39 - 117 U/L   AST 16 0 - 37 U/L   ALT 11 0 - 53 U/L   Total Protein 7.0 6.0 - 8.3 g/dL   Albumin 4.0 3.5 - 5.2 g/dL  Calcium 9.5 8.4 - 10.5 mg/dL   GFR 50.25 (L) >60.00 mL/min  Comp Met (CMET)     Status: Abnormal    Collection Time: 06/30/14  2:01 PM  Result Value Ref Range   Sodium 142 135 - 145 mEq/L   Potassium 3.3 (L) 3.5 - 5.1 mEq/L   Chloride 105 96 - 112 mEq/L   CO2 29 19 - 32 mEq/L   Glucose, Bld 123 (H) 70 - 99 mg/dL   BUN 19 6 - 23 mg/dL   Creatinine, Ser 1.33 0.40 - 1.50 mg/dL   Total Bilirubin 1.3 (H) 0.2 - 1.2 mg/dL   Alkaline Phosphatase 54 39 - 117 U/L   AST 25 0 - 37 U/L   ALT 22 0 - 53 U/L   Total Protein 6.6 6.0 - 8.3 g/dL   Albumin 3.7 3.5 - 5.2 g/dL   Calcium 9.0 8.4 - 10.5 mg/dL   GFR 55.96 (L) >60.00 mL/min    Assessment/Plan: Acute bacterial bronchitis Rx doxycycline. Increase fluids. Continue care discussed at yesterday's visit. Continue Tessalon perles. Follow-up if not resolving.  Emphysema of lung CXR shows changes likely due to chronic emphysema. Patient denies history. Is former smoker. O2 99% on room air with acute bronchitis. Referral to pulmonology placed for assessment and LFTs.

## 2014-07-28 NOTE — Telephone Encounter (Signed)
Please advise.//AB/CMA 

## 2014-07-28 NOTE — Telephone Encounter (Signed)
Called and spoke with the pt's wife and informed her of the note below.  She verbalized understanding, but stated that she feels the pt is having a flare-up of his CHF.   She said the last time the pt had the same symptoms he was dx with CHF.  He is fatigued and coughing up yellow mucus, and she feels something else is going on.  Informed the wife that I verbally informed Einar Pheasant of her concerns,and he recommended a follow-up appt, today or tomorrow and also to get a CXR (stat). She agreed and wanted an appt today.   Pt was scheduled today @ 3:00pm with [facility] for follow-up and a stat CXR was ordered.  Informed the wife if she could have the pt to come early to have the CXR done before his appt.  She agreed.//AB/CMA

## 2014-08-03 ENCOUNTER — Telehealth: Payer: Self-pay

## 2014-08-03 NOTE — Telephone Encounter (Signed)
Samples of Eliquis 5mg  4 boxes provided. Inquired if patient ever applied for Patient Assistance. He said he had, but that drug never received his application. i advised him I would try to help him/

## 2014-08-06 ENCOUNTER — Encounter: Payer: Self-pay | Admitting: Physician Assistant

## 2014-08-06 ENCOUNTER — Ambulatory Visit (INDEPENDENT_AMBULATORY_CARE_PROVIDER_SITE_OTHER): Payer: Medicare Other | Admitting: Physician Assistant

## 2014-08-06 VITALS — BP 142/71 | HR 79 | Temp 98.3°F | Ht 72.0 in | Wt 218.4 lb

## 2014-08-06 DIAGNOSIS — L200 Besnier's prurigo: Secondary | ICD-10-CM | POA: Diagnosis not present

## 2014-08-06 DIAGNOSIS — L239 Allergic contact dermatitis, unspecified cause: Secondary | ICD-10-CM

## 2014-08-06 DIAGNOSIS — I10 Essential (primary) hypertension: Secondary | ICD-10-CM | POA: Diagnosis not present

## 2014-08-06 MED ORDER — METHYLPREDNISOLONE 4 MG PO TBPK: ORAL_TABLET | ORAL | Status: DC

## 2014-08-06 NOTE — Patient Instructions (Signed)
Please stay well hydrated but stop the coca-cola. Limit salt intake and take your blood pressure medications as directed.   Take steroid as directed for rash. Wash all linens in hot water without detergent.  Get a non-scented, non-dyed detergent for clothes washing from this point forward. Call if steroid is not resolving symptoms.  DASH Eating Plan DASH stands for "Dietary Approaches to Stop Hypertension." The DASH eating plan is a healthy eating plan that has been shown to reduce high blood pressure (hypertension). Additional health benefits may include reducing the risk of type 2 diabetes mellitus, heart disease, and stroke. The DASH eating plan may also help with weight loss. WHAT DO I NEED TO KNOW ABOUT THE DASH EATING PLAN? For the DASH eating plan, you will follow these general guidelines:  Choose foods with a percent daily value for sodium of less than 5% (as listed on the food label).  Use salt-free seasonings or herbs instead of table salt or sea salt.  Check with your health care provider or pharmacist before using salt substitutes.  Eat lower-sodium products, often labeled as "lower sodium" or "no salt added."  Eat fresh foods.  Eat more vegetables, fruits, and low-fat dairy products.  Choose whole grains. Look for the word "whole" as the first word in the ingredient list.  Choose fish and skinless chicken or [place] more often than red meat. Limit fish, poultry, and meat to 6 oz (170 g) each day.  Limit sweets, desserts, sugars, and sugary drinks.  Choose heart-healthy fats.  Limit cheese to 1 oz (28 g) per day.  Eat more home-cooked food and less restaurant, buffet, and fast food.  Limit fried foods.  Cook foods using methods other than frying.  Limit canned vegetables. If you do use them, rinse them well to decrease the sodium.  When eating at a restaurant, ask that your food be prepared with less salt, or no salt if possible. WHAT FOODS CAN I EAT? Seek  help from a dietitian for individual calorie needs. Grains Whole grain or whole wheat bread. Brown rice. Whole grain or whole wheat pasta. Quinoa, bulgur, and whole grain cereals. Low-sodium cereals. Corn or whole wheat flour tortillas. Whole grain cornbread. Whole grain crackers. Low-sodium crackers. Vegetables Fresh or frozen vegetables (raw, steamed, roasted, or grilled). Low-sodium or reduced-sodium tomato and vegetable juices. Low-sodium or reduced-sodium tomato sauce and paste. Low-sodium or reduced-sodium canned vegetables.  Fruits All fresh, canned (in natural juice), or frozen fruits. Meat and Other Protein Products Ground beef (85% or leaner), grass-fed beef, or beef trimmed of fat. Skinless chicken or [place]. Ground chicken or [place]. Pork trimmed of fat. All fish and seafood. Eggs. Dried beans, peas, or lentils. Unsalted nuts and seeds. Unsalted canned beans. Dairy Low-fat dairy products, such as skim or 1% milk, 2% or reduced-fat cheeses, low-fat ricotta or cottage cheese, or plain low-fat yogurt. Low-sodium or reduced-sodium cheeses. Fats and Oils Tub margarines without trans fats. Light or reduced-fat mayonnaise and salad dressings (reduced sodium). Avocado. Safflower, olive, or canola oils. Natural peanut or almond butter. Other Unsalted popcorn and pretzels. The items listed above may not be a complete list of recommended foods or beverages. Contact your dietitian for more options. WHAT FOODS ARE NOT RECOMMENDED? Grains White bread. White pasta. White rice. Refined cornbread. Bagels and croissants. Crackers that contain trans fat. Vegetables Creamed or fried vegetables. Vegetables in a cheese sauce. Regular canned vegetables. Regular canned tomato sauce and paste. Regular tomato and vegetable juices. Fruits Dried fruits. Canned  fruit in light or heavy syrup. Fruit juice. Meat and Other Protein Products Fatty cuts of meat. Ribs, chicken wings, bacon, sausage, bologna, salami,  chitterlings, fatback, hot dogs, bratwurst, and packaged luncheon meats. Salted nuts and seeds. Canned beans with salt. Dairy Whole or 2% milk, cream, half-and-half, and cream cheese. Whole-fat or sweetened yogurt. Full-fat cheeses or blue cheese. Nondairy creamers and whipped toppings. Processed cheese, cheese spreads, or cheese curds. Condiments Onion and garlic salt, seasoned salt, table salt, and sea salt. Canned and packaged gravies. Worcestershire sauce. Tartar sauce. Barbecue sauce. Teriyaki sauce. Soy sauce, including reduced sodium. Steak sauce. Fish sauce. Oyster sauce. Cocktail sauce. Horseradish. Ketchup and mustard. Meat flavorings and tenderizers. Bouillon cubes. Hot sauce. Tabasco sauce. Marinades. Taco seasonings. Relishes. Fats and Oils Butter, stick margarine, lard, shortening, ghee, and bacon fat. Coconut, palm kernel, or palm oils. Regular salad dressings. Other Pickles and olives. Salted popcorn and pretzels. The items listed above may not be a complete list of foods and beverages to avoid. Contact your dietitian for more information. WHERE CAN I FIND MORE INFORMATION? National Heart, Lung, and Blood Institute: travelstabloid.com Document Released: 01/04/2011 Document Revised: 06/01/2013 Document Reviewed: 11/19/2012 [facility] Patient Information 2015 [place], [place]. This information is not intended to replace advice given to you by your health care provider. Make sure you discuss any questions you have with your health care provider.

## 2014-08-06 NOTE — Progress Notes (Signed)
Pre visit review using our clinic review tool, if applicable. No additional management support is needed unless otherwise documented below in the visit note. 

## 2014-08-08 DIAGNOSIS — L239 Allergic contact dermatitis, unspecified cause: Secondary | ICD-10-CM | POA: Insufficient documentation

## 2014-08-08 NOTE — Assessment & Plan Note (Signed)
Rx Medrol dose pack. Supportive measures reviewed. Follow-up PRN.

## 2014-08-08 NOTE — Progress Notes (Signed)
Patient presents to clinic today c/o worsening pruritic rash of bilateral upper extremities now spreading to torso. Denies fever, chills, pain. Denies sick contact with rash. Denies new pet. Denies change in soaps, lotions or detergents.  Patient also notes BP has been slightly elevated above baseline over the past few weeks. Has been taking medications as directed. Patient denies chest pain, palpitations, lightheadedness, dizziness, vision changes or frequent headaches. BP averaging 140s-150s. Patient and wife both endorse patient has been eating very poorly over the past few weeks and has restarted drinking large amounts of soda.  Past Medical History  Diagnosis Date  . Ventricular hypertrophy   . Persistent atrial fibrillation   . Hiatal hernia   . 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 Cholelithiasis   . Sleep apnea   . Nephrolithiasis   . Arthritis   . Hypertension   . Ankylosing spondylitis   . GERD (gastroesophageal reflux disease)   . CAD (coronary artery disease) 0569,7948    30% mid LAD lesion on cardiac cath  . Chicken pox   . [place] measles     Current Outpatient Prescriptions on File Prior to Visit  Medication Sig Dispense Refill  . amLODipine (NORVASC) 5 MG tablet Take 1 tablet (5 mg total) by mouth daily. 30 tablet 5  . apixaban (ELIQUIS) 5 MG TABS tablet Take 1 tablet (5 mg total) by mouth 2 (two) times daily. 14 tablet 0  . diltiazem (CARDIZEM CD) 180 MG 24 hr capsule Take 1 capsule (180 mg total) by mouth daily. 15 capsule 0  . fluticasone (FLONASE) 50 MCG/ACT nasal spray Place 2 sprays into both nostrils daily. 16 g 6  . furosemide (LASIX) 20 MG tablet Take 1 tablet (20 mg total) by mouth daily. 30 tablet 5  . HYDROcodone-acetaminophen (NORCO) 10-325 MG per tablet Take 1 tablet by mouth every 6 (six) hours as needed. 120 tablet 0  . metoprolol succinate (TOPROL-XL) 100 MG 24 hr tablet TAKE 1 TABLET BY MOUTH 2 TIMES DAILY. 60 tablet 5  . triamcinolone cream  (KENALOG) 0.1 % Apply 1 application topically 2 (two) times daily. 30 g 0  . Loperamide HCl (IMODIUM PO) Take by mouth as needed.      . potassium chloride SA (K-DUR,KLOR-CON) 20 MEQ tablet Take 1 tablet (20 mEq total) by mouth daily. (Patient not taking: Reported on 08/06/2014) 30 tablet 3   No current facility-administered medications on file prior to visit.    Allergies  Allergen Reactions  . Coumadin [Warfarin]     GI bleeds    Family History  Problem Relation Age of Onset  . Colon cancer Neg Hx   . Heart disease Father 77    Deceased  . Heart attack Father 88  . Heart disease Brother 47    "Died in his sleep"  . Arthritis/Rheumatoid Mother     Deceased-86  . Dementia Paternal Grandmother   . Cancer Paternal Uncle   . Hyperlipidemia Brother   . Stroke Brother 48    Deceased  . Other Sister     Estate agent in [place]  . Other Sister     MVA  . Other Brother     Carjacked-killed  . Healthy Brother   . Healthy Son     X3  . Healthy Daughter     x1    History   Social History  . Marital Status: Married    Spouse Name: N/A  . Number of Children: 4  . Years  of Education: N/A   Occupational History  . Retired     Estate manager/land agent   Social History Main Topics  . Smoking status: Never Smoker   . Smokeless tobacco: Never Used  . Alcohol Use: No  . Drug Use: No  . Sexual Activity: Not on file   Other Topics Concern  . None   Social History Narrative   4 caffeine drinks daily     Review of Systems - See HPI.  All other ROS are negative.  BP 142/71 mmHg  Pulse 79  Temp(Src) 98.3 F (36.8 C) (Oral)  Ht 6' (1.829 m)  Wt 218 lb 6.4 oz (99.066 kg)  BMI 29.61 kg/m2  SpO2 98%  Physical Exam  Constitutional: He is oriented to person, place, and time and well-developed, well-nourished, and in no distress.  HENT:  Head: Normocephalic and atraumatic.  Eyes: Conjunctivae are normal.  Neck: Neck supple.  Cardiovascular: Normal rate, regular rhythm, normal heart  sounds and intact distal pulses.   Pulmonary/Chest: Effort normal and breath sounds normal. No respiratory distress. He has no wheezes. He has no rales. He exhibits no tenderness.  Neurological: He is alert and oriented to person, place, and time.  Skin: Skin is warm and dry. Rash noted.  Psychiatric: Affect normal.  Vitals reviewed.   Recent Results (from the past 2160 hour(s))  CBC w/Diff     Status: Abnormal   Collection Time: 06/24/14  2:21 PM  Result Value Ref Range   WBC 11.0 (H) 4.0 - 10.5 K/uL   RBC 5.86 (H) 4.22 - 5.81 Mil/uL   Hemoglobin 16.8 13.0 - 17.0 g/dL   HCT 50.5 39.0 - 52.0 %   MCV 86.3 78.0 - 100.0 fl   MCHC 33.3 30.0 - 36.0 g/dL   RDW 14.6 11.5 - 15.5 %   Platelets 228.0 150.0 - 400.0 K/uL   Neutrophils Relative % 67.7 43.0 - 77.0 %   Lymphocytes Relative 19.7 12.0 - 46.0 %   Monocytes Relative 9.6 3.0 - 12.0 %   Eosinophils Relative 2.6 0.0 - 5.0 %   Basophils Relative 0.4 0.0 - 3.0 %   Neutro Abs 7.4 1.4 - 7.7 K/uL   Lymphs Abs 2.2 0.7 - 4.0 K/uL   Monocytes Absolute 1.1 (H) 0.1 - 1.0 K/uL   Eosinophils Absolute 0.3 0.0 - 0.7 K/uL   Basophils Absolute 0.0 0.0 - 0.1 K/uL  Uric acid     Status: Abnormal   Collection Time: 06/24/14  2:21 PM  Result Value Ref Range   Uric Acid, Serum 9.4 (H) 4.0 - 7.8 mg/dL  Comprehensive metabolic panel     Status: Abnormal   Collection Time: 06/24/14  2:21 PM  Result Value Ref Range   Sodium 142 135 - 145 mEq/L   Potassium 3.3 (L) 3.5 - 5.1 mEq/L   Chloride 103 96 - 112 mEq/L   CO2 31 19 - 32 mEq/L   Glucose, Bld 88 70 - 99 mg/dL   BUN 15 6 - 23 mg/dL   Creatinine, Ser 1.46 0.40 - 1.50 mg/dL   Total Bilirubin 2.4 (H) 0.2 - 1.2 mg/dL   Alkaline Phosphatase 62 39 - 117 U/L   AST 16 0 - 37 U/L   ALT 11 0 - 53 U/L   Total Protein 7.0 6.0 - 8.3 g/dL   Albumin 4.0 3.5 - 5.2 g/dL   Calcium 9.5 8.4 - 10.5 mg/dL   GFR 50.25 (L) >60.00 mL/min  Comp Met (CMET)  Status: Abnormal   Collection Time: 06/30/14  2:01 PM    Result Value Ref Range   Sodium 142 135 - 145 mEq/L   Potassium 3.3 (L) 3.5 - 5.1 mEq/L   Chloride 105 96 - 112 mEq/L   CO2 29 19 - 32 mEq/L   Glucose, Bld 123 (H) 70 - 99 mg/dL   BUN 19 6 - 23 mg/dL   Creatinine, Ser 1.33 0.40 - 1.50 mg/dL   Total Bilirubin 1.3 (H) 0.2 - 1.2 mg/dL   Alkaline Phosphatase 54 39 - 117 U/L   AST 25 0 - 37 U/L   ALT 22 0 - 53 U/L   Total Protein 6.6 6.0 - 8.3 g/dL   Albumin 3.7 3.5 - 5.2 g/dL   Calcium 9.0 8.4 - 10.5 mg/dL   GFR 55.96 (L) >60.00 mL/min    Assessment/Plan: Essential hypertension BP 142/71 in clinic. Asymptomatic. Change likely due to recent dietary changes. Stop sodas. DASH diet reviewed and encouraged. Handout given in AVS. Continue medications as directed.  Allergic dermatitis Rx Medrol dose pack. Supportive measures reviewed. Follow-up PRN.

## 2014-08-08 NOTE — Assessment & Plan Note (Signed)
BP 142/71 in clinic. Asymptomatic. Change likely due to recent dietary changes. Stop sodas. DASH diet reviewed and encouraged. Handout given in AVS. Continue medications as directed.

## 2014-08-12 ENCOUNTER — Telehealth: Payer: Medicare Other | Admitting: Physician Assistant

## 2014-08-12 DIAGNOSIS — Z9189 Other specified personal risk factors, not elsewhere classified: Secondary | ICD-10-CM

## 2014-08-12 DIAGNOSIS — W57XXXA Bitten or stung by nonvenomous insect and other nonvenomous arthropods, initial encounter: Secondary | ICD-10-CM

## 2014-08-12 MED ORDER — DOXYCYCLINE HYCLATE 100 MG PO CAPS
100.0000 mg | ORAL_CAPSULE | Freq: Two times a day (BID) | ORAL | Status: DC
Start: 2014-08-12 — End: 2014-08-24

## 2014-08-12 NOTE — Progress Notes (Signed)
Thank you for describing your tick bite, Here is how we plan to help! Based on the information that you shared with me it looks like you have An infected or complicated tick bite that requires a longer course of antibiotics and will need for you to schedule a follow-up visit with a provider.  Follow-up with your primary provider (me) in office next Wednesday or Friday.  In most cases a tick bite is painless and does not itch.  Most tick bites in which the tick is quickly removed do not require prescriptions. Ticks can transmit several diseases if they are infected and remain attacked to your skin. Therefore the length that the tick was attached and any symptoms you have experienced after the bite are import to accurately develop your custom treatment plan. In most cases a single dose of doxycycline may prevent the development of a more serious condition.  Based on your information I have Your symptoms indicate that you need a longer course of antibiotics and a follow up visit with a provider. I have sent doxycycline 100 mg twice a day for 21 days to the pharmacy that you selected. You will need to schedule a follow up visit with your provider. If you do not have a primary care provider you may use our telehealth physicians on the web at [place].  Which ticks  are associated with illness?  The Wood Tick (dog tick) is the size of a watermelon seed and can sometimes transmit [facility] spotted fever and [place] tick fever.   The Deer Tick (black-legged tick) is between the size of a poppy seed (pin head) and an apple seed, and can sometimes transmit Lyme disease.  A brown to black tick with a white splotch on its back is likely a male Amblyomma americanum ([place] tick). This tick has been associated with Southern Tick Associated illness ( STARI)  Lyme disease has become the most common tick-borne illness in the [place]. The risk of Lyme disease following a recognized deer  tick bite is estimated to be 1%.  The majority of cases of Lyme disease start with a bull's eye rash at the site of the tick bite. The rash can occur days to weeks (typically 7-10 days) after a tick bite. Treatment with antibiotics is indicated if this rash appears. Flu-like symptoms may accompany the rash, including: fever, chills, headaches, muscle aches, and fatigue. Removing ticks promptly may prevent tick borne disease.  What can be used to prevent Tick Bites?   Insect repellant with at leas 20% DEET.  Wearing long pants with sock and shoes.  Avoiding tall grass and heavily wooded areas.  Checking your skin after being outdoors.  Shower with a washcloth after outdoor exposures.  HOME CARE ADVICE FOR TICK BITE  1. Wood Tick Removal:  o Use a pair of tweezers and grasp the wood tick close to the skin (on its head). Pull the wood tick straight upward without twisting or crushing it. Maintain a steady pressure until it releases its grip.   o If tweezers aren't available, use fingers, a loop of thread around the jaws, or a needle between the jaws for traction.  o Note: covering the tick with petroleum jelly, nail polish or rubbing alcohol doesn't work. Neither does touching the tick with a hot or cold object. 2. Tiny Deer Tick Removal:   o Needs to be scraped off with a knife blade or credit card edge. o Place tick in a sealed container (e.g.  glass jar, zip lock plastic bag), in case your doctor wants to see it. 3. Tick's Head Removal:  o If the wood tick's head breaks off in the skin, it must be removed. Clean the skin. Then use a sterile needle to uncover the head and lift it out or scrape it off.  o If a very small piece of the head remains, the skin will eventually slough it off. 4. Antibiotic Ointment:  o Wash the wound and your hands with soap and water after removal to prevent catching any tick disease.  Apply an over the counter antibiotic ointment (e.g. bacitracin) to the bite  once. 5. Expected Course: Tick bites normally don't itch or hurt. That's why they often go unnoticed. 6. Call Your Doctor If:  o You can't remove the tick or the tick's head o Fever, a severe head ache, or rash occur in the next 2 weeks o Bite begins to look infected o Lyme's disease is common in your area o You have not had a tetanus in the last 10 years o Your current symptoms become worse    MAKE SURE YOU   Understand these instructions.  Will watch your condition.  Will get help right away if you are not doing well or get worse.   Thank you for choosing an e-visit. Your e-visit answers were reviewed by a board certified advanced clinical practitioner to complete your personal care plan. Depending upon the condition, your plan could have included both over the counter or prescription medications. Please review your pharmacy choice. If there is a problem you may use MyChart messaging to have the prescription routed to another pharmacy. Your safety is important to [place]. If you have drug allergies check your prescription carefully.  You can use MyChart to ask questions about today's visit, request a non-urgent call back, or ask for a work or school excuse. You will get an email in the next two days asking about your experience. I hope that your e-visit has been valuable and will speed your recovery

## 2014-08-17 ENCOUNTER — Telehealth: Payer: Self-pay | Admitting: Cardiology

## 2014-08-17 NOTE — Telephone Encounter (Signed)
Medication samples have been provided to the patient.  Drug name: eliquis 5  Qty: 14  LOT: [id number]  Exp.Date: 10/2016  Samples left at front desk for patient pick-up. Patient notified.  Advised to call [facility] or later in week to check on more samples.   Sheral Apley M 3:16 PM 08/17/2014

## 2014-08-17 NOTE — Telephone Encounter (Signed)
Patient needs samples of Eliquis--he still has a few tabs

## 2014-08-24 ENCOUNTER — Ambulatory Visit (INDEPENDENT_AMBULATORY_CARE_PROVIDER_SITE_OTHER): Payer: Medicare Other | Admitting: Internal Medicine

## 2014-08-24 ENCOUNTER — Encounter: Payer: Self-pay | Admitting: Internal Medicine

## 2014-08-24 VITALS — BP 138/80 | HR 88 | Ht 72.0 in | Wt 215.6 lb

## 2014-08-24 DIAGNOSIS — R05 Cough: Secondary | ICD-10-CM | POA: Diagnosis not present

## 2014-08-24 DIAGNOSIS — R053 Chronic cough: Secondary | ICD-10-CM

## 2014-08-24 DIAGNOSIS — R918 Other nonspecific abnormal finding of lung field: Secondary | ICD-10-CM | POA: Insufficient documentation

## 2014-08-24 NOTE — Progress Notes (Signed)
Subjective:    Patient ID: [name] Woods, male    DOB: [birth year], 74 y.o.   MRN: [id number]  HPI  REferred for cough   OV 04/07/2012  He  date distinct increase/ new cough x 6 weeks. He may have had a non-febrile cold near the onset, but no purulent sputum. He did cut holes in a new polyurethane sealed floor w/o respiratory protection at that time. Cough began dry but progressed to productive of thick clear, choking mucus. Worse at night- wakes him 2-3x. Tried wife's asthma inhaler once- helped.   Then admitted with dyspnea as well 03/21/12 to 03/25/12- Noted to be in worseing of preexistent A Fib but with new low EF with echo ef 45% but RHC and LHC normal. ACE inhibitor stopped.  Also, Rx with lasix  (new lasix just prior to the amdission) Lopressor increaesd. There was pulm consult at this time.  Since then cough better 70%.  Currently cough is moderate. Cough currently made worse by answering phone, sleeping/lying down. HE feels theere is mucus in back of throat. Feels tickle in throat with some mild sinus drainage. Cough ? improved by inhalers ; currently on dulera    He reports dx of obstructive sleep apnea by [facility]?) but never treated. Family confirms loud snore    Sinus - mild sinus drainage currently  Allergy - denies spirng allpergies  BP - ACE inhibitor stopped feb 2014; currently in lopresso  GI/GERD Denies GERD Body mass index is 29.18 kg/(m^2).  Pulmonary 06/21/08 - CT abd lung cut - some non specific fibrotic stranding in bases CXR Feb 2014 - no acute proicess  EXposure  reports that he has never smoked. He has never used smokeless tobacco. 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 Denies mold inhouse.   REC Glad cough is much improved after they stopped lisinopril and started on Lasix  Unclear why you're having residual cough  To sort this out  - stop dulera  - Please use 3% hypertonic Milta Deiters med sinus nasal spray that you can get over-the-counter  at [facility] or [facility] to [employer]  - take generic fluticasone inhaler 2 squirts each nostril daily  -Please have full pulmonary function testing within the next 2-3 weeks (after being off dulera) and as soon as finished it please call and let us know so I can review it and call you back about additional breathing test on the CT scan of the chest  #Followup  - Based on results of pulmonary function testing which you need to have within the next 2-3 weeks at any location that is convenient to you   Telephone call 04/18/2012 According to the son. Patient has had a cough approximately 2 months ago and after that the daughter-in-law never cough 6 weeks ago. After that is a family never cough. According to the daughter-in-law is primary care physician Dr. Cindi Carbon he states that he saw daughter-in-law approximately one month ago with cough was given Z-Pak but due to persistence of cough he recently tested her for pertussis and today the result showed IgG and IgM positive. The son is keen that the patient get tested for pertussis. Therefore I have ordered the test.    TElephone call 04/22/12 - pertussis  B Ased on my review of laboratory results he likely had "recent" pertussis infection which means he does not have it now but had it sometime 6 weeks ago to 6-12 months ago. So, it is possible that his cough when it started  could have been due to pertussis but by the time I saw him he was not in a phase where zpak would help. I   LABS in April 2014 CT chest 05/19/2012 is basically normal. Some 4  millimeter nodule seen but this requires followup in one year does not explain the cause of cough.   Pulmonary function test 04/29/2012 is completely normal.    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  He reports that his cough is one third better. In fact his RSI cough score is 23 compared to 32 at prior visit. This correlates with one third improvement.  However cough is still at a level where it is bothersome. He thinks it's the passage of time has helped with improvement of cough. He is not using his nasal steroid oh saline nasal spray as advised. However he continues to be office inhaled corticosteroids. For the details of cough on and the table below  Past, Family, Social reviewed: no change since last visit    OV 08/24/2014  Chief Complaint  Patient presents with  . Follow-up    Pt last seen by MR on 5.13.2014 for cough. Pt has been re-referred by [name], PA for emphysema. Pt c/o dry cough. Pt denies SOB, chest congestion, CP/tightness.    Follow-up chronic cough. Last seen May 2014 just over 2 years ago. At that time he had post pertussis cough. He then was lost to follow-up. He tells me that after that visit 2 years ago cough resolved. Now his cough is returned approximately a few months ago. Insidious onset. Course is stable since onset. It's mild in severity although on the RSI cough score below cough is at least moderate in severity. The quality is dry. It is worse at night. It is better in the daytime. This no specific aggravating or relieving factors other than stated. There is some associated sinus drainage and a tickle in the throat and acid reflux but no wheezing or shortness of breath. Primary care physician was concerned about emphysema but he says he is never smoked.  CT scan of the chest May 2014 personally visualized: Lung fields clear showed multiple small pulmonary nodules less than 4 mm  Pulmonary function test May 2014 on personally visualized image. This is normal.    Dr Lorenza Cambridge Reflux Symptom Index (> 13-15 suggestive of LPR cough) 0 -> 5  =  none ->severe problem 04/07/2012  06/10/2012 1/3rd better only 08/24/2014   Hoarseness of problem with voice 5 5 1   Clearing  Of Throat 3 5 3   Excess throat mucus or feeling of post nasal drip 3 3 1   Difficulty swallowing food, liquid or tablets 3 0 0  Cough after eating  or lying down 3 3 5   Breathing difficulties or choking episodes 5 0 1  Troublesome or annoying cough 5 2 5   Sensation of something sticking in throat or lump in throat 5 5 4   Heartburn, chest pain, indigestion, or stomach acid coming up 0 0 3  TOTAL 32 23 23  RX intervention Nasal steroid, saline spray and time Rx      Current outpatient prescriptions:  .  amLODipine (NORVASC) 5 MG tablet, Take 1 tablet (5 mg total) by mouth daily., Disp: 30 tablet, Rfl: 5 .  apixaban (ELIQUIS) 5 MG TABS tablet, Take 1 tablet (5 mg total) by mouth 2 (two) times daily., Disp: 14 tablet, Rfl: 0 .  diltiazem (CARDIZEM CD) 180  MG 24 hr capsule, Take 1 capsule (180 mg total) by mouth daily., Disp: 15 capsule, Rfl: 0 .  furosemide (LASIX) 20 MG tablet, Take 1 tablet (20 mg total) by mouth daily., Disp: 30 tablet, Rfl: 5 .  HYDROcodone-acetaminophen (NORCO) 10-325 MG per tablet, Take 1 tablet by mouth every 6 (six) hours as needed., Disp: 120 tablet, Rfl: 0 .  Loperamide HCl (IMODIUM PO), Take by mouth as needed.  , Disp: , Rfl:  .  metoprolol succinate (TOPROL-XL) 100 MG 24 hr tablet, TAKE 1 TABLET BY MOUTH 2 TIMES DAILY., Disp: 60 tablet, Rfl: 5 .  fluticasone (FLONASE) 50 MCG/ACT nasal spray, Place 2 sprays into both nostrils daily. (Patient not taking: Reported on 08/24/2014), Disp: 16 g, Rfl: 6 .  potassium chloride SA (K-DUR,KLOR-CON) 20 MEQ tablet, Take 1 tablet (20 mEq total) by mouth daily. (Patient not taking: Reported on 08/06/2014), Disp: 30 tablet, Rfl: 3 .  triamcinolone cream (KENALOG) 0.1 %, Apply 1 application topically 2 (two) times daily. (Patient not taking: Reported on 08/24/2014), Disp: 30 g, Rfl: 0     Review of Systems  Constitutional: Negative for fever and unexpected weight change.  HENT: Negative for congestion, dental problem, ear pain, nosebleeds, postnasal drip, rhinorrhea, sinus pressure, sneezing, sore throat and trouble swallowing.   Eyes: Negative for redness and itching.    Respiratory: Positive for cough. Negative for chest tightness, shortness of breath and wheezing.   Cardiovascular: Negative for palpitations and leg swelling.  Gastrointestinal: Negative for nausea and vomiting.  Genitourinary: Negative for dysuria.  Musculoskeletal: Negative for joint swelling.  Skin: Negative for rash.  Neurological: Negative for headaches.  Hematological: Does not bruise/bleed easily.  Psychiatric/Behavioral: Negative for dysphoric mood. The patient is not nervous/anxious.        Objective:   Physical Exam  Constitutional: He is oriented to person, place, and time. He appears well-developed and well-nourished. No distress.  Somewhat disheveled  HENT:  Head: Normocephalic and atraumatic.  Right Ear: External ear normal.  Left Ear: External ear normal.  Mouth/Throat: Oropharynx is clear and moist. No oropharyngeal exudate.  Eyes: Conjunctivae and EOM are normal. Pupils are equal, round, and reactive to light. Right eye exhibits no discharge. Left eye exhibits no discharge. No scleral icterus.  Neck: Normal range of motion. Neck supple. No JVD present. No tracheal deviation present. No thyromegaly present.  Cardiovascular: Normal rate, regular rhythm and intact distal pulses.  Exam reveals no gallop and no friction rub.   No murmur heard. Pulmonary/Chest: Effort normal and breath sounds normal. No respiratory distress. He has no wheezes. He has no rales. He exhibits no tenderness.  Abdominal: Soft. Bowel sounds are normal. He exhibits no distension and no mass. There is no tenderness. There is no rebound and no guarding.  Musculoskeletal: Normal range of motion. He exhibits no edema or tenderness.  Lymphadenopathy:    He has no cervical adenopathy.  Neurological: He is alert and oriented to person, place, and time. He has normal reflexes. No cranial nerve deficit. Coordination normal.  Skin: Skin is warm and dry. No rash noted. He is not diaphoretic. No erythema. No  pallor.  Psychiatric: He has a normal mood and affect. His behavior is normal. Judgment and thought content normal.  Nursing note and vitals reviewed.   Filed Vitals:   08/24/14 1636  BP: 138/80  Pulse: 88  Height: 6' (1.829 m)  Weight: 215 lb 9.6 oz (97.796 kg)  SpO2: 96%  Assessment & Plan:     ICD-9-CM ICD-10-CM   1. Chronic cough 786.2 R05 CT Chest High Resolution     Pulmonary function test  2. Lung nodule, multiple 793.19 R91.8 CT Chest High Resolution     Pulmonary function test   #Multiple lung nodules 4 mm Even though he is at low risk for lung cancer given the fact it multiple lung nodules and he is of older age he needs a repeat CT scan of the chest. He supposedly had it a year ago we will do that now. It'll also help in terms of his chronic cough evaluation  Chronic cough -worseneed Unclear why this cough is returned. He denies any postviral reactive cough. Wwe will reassess with a full pulmonary function test and the CT scan of the chest    Dr. Brand Males, M.D., [facility].C.P Pulmonary and Critical Care Medicine Staff Physician [place] [facility] Pager: [phone], If no answer or between  15:00h - 7:00h: call [phone]  08/24/2014 5:08 PM

## 2014-08-24 NOTE — Patient Instructions (Signed)
ICD-9-CM ICD-10-CM   1. Chronic cough 786.2 R05 CT Chest High Resolution     Pulmonary function test  2. Lung nodule, multiple 793.19 R91.8 CT Chest High Resolution     Pulmonary function test    Do ct chest Do full pft  Plan Return for followup after above to see me or my NP Tammy

## 2014-08-25 ENCOUNTER — Telehealth: Payer: Self-pay | Admitting: Physician Assistant

## 2014-08-25 DIAGNOSIS — Z79891 Long term (current) use of opiate analgesic: Secondary | ICD-10-CM | POA: Diagnosis not present

## 2014-08-25 MED ORDER — HYDROCODONE-ACETAMINOPHEN 10-325 MG PO TABS: 1.0000 | ORAL_TABLET | Freq: Four times a day (QID) | ORAL | Status: DC | PRN

## 2014-08-25 NOTE — Telephone Encounter (Signed)
Will grant refill. He will need to give UDS sample at time of pickup.

## 2014-08-25 NOTE — Telephone Encounter (Signed)
Requesting Hydrocodone-acet.10-325mg -Take 1 tablet by mouth every 6 hours as needed. Last refill:07/27/14;#120,0 Last OV:08/06/14 UDS:05/03/14-CSC signed no urine given Please advise.//AB/CMA

## 2014-08-25 NOTE — Telephone Encounter (Signed)
Can be reached: [phone]  Reason for call: Pt called for refill on hydrocodone. Has 3 days left. Please call when ready.

## 2014-08-25 NOTE — Telephone Encounter (Signed)
Called and informed the pt of the note below.  Pt verbalized understanding and agreed.//AB/CMA

## 2014-08-30 ENCOUNTER — Ambulatory Visit (INDEPENDENT_AMBULATORY_CARE_PROVIDER_SITE_OTHER)
Admission: RE | Admit: 2014-08-30 | Discharge: 2014-08-30 | Disposition: A | Discharge status: Home / Self Care | Payer: Medicare Other | Source: Ambulatory Visit | Attending: Internal Medicine | Admitting: Internal Medicine

## 2014-08-30 DIAGNOSIS — I700 Atherosclerosis of aorta: Secondary | ICD-10-CM | POA: Diagnosis not present

## 2014-08-30 DIAGNOSIS — R05 Cough: Secondary | ICD-10-CM

## 2014-08-30 DIAGNOSIS — R918 Other nonspecific abnormal finding of lung field: Secondary | ICD-10-CM | POA: Diagnosis not present

## 2014-08-30 DIAGNOSIS — R053 Chronic cough: Secondary | ICD-10-CM

## 2014-09-05 ENCOUNTER — Telehealth: Payer: Self-pay | Admitting: Internal Medicine

## 2014-09-05 DIAGNOSIS — R918 Other nonspecific abnormal finding of lung field: Secondary | ICD-10-CM

## 2014-09-05 NOTE — Telephone Encounter (Signed)
[name] Woods  Let [name] Woods know that lung nodules are all stable. Repeat CT chst wo contrast in 1 year. HE still has to complete his PFT and return for fu of cough  Dr. Brand Males, M.D., [facility].C.P Pulmonary and Critical Care Medicine Staff Physician [place] [facility] Pager: [phone], If no answer or between  15:00h - 7:00h: call [phone]  09/05/2014 10:22 PM

## 2014-09-05 NOTE — Telephone Encounter (Signed)
Daneil Dan  See prior phoine note that was accidentally close  Thanks  Dr. Brand Males, M.D., [facility].C.P Pulmonary and Critical Care Medicine Staff Physician [place] [facility] Pager: [phone], If no answer or between  15:00h - 7:00h: call [phone]  09/05/2014 10:22 PM

## 2014-09-06 NOTE — Telephone Encounter (Signed)
Brand Males, MD at 09/05/2014 10:21 PM     Status: Signed       Expand All Collapse All   Daneil Dan  Let [name] know that lung nodules are all stable. Repeat CT chst wo contrast in 1 year. HE still has to complete his PFT and return for fu of cough        lmtcb for pt.

## 2014-09-08 NOTE — Telephone Encounter (Signed)
Called and spoke to pt. Informed him of the results and recs per MR. Appt made for PFT and OV with MR on 9.23.16. Order placed for f/u CT. Pt verbalized understanding and denied any further questions or concerns at this time.

## 2014-09-10 ENCOUNTER — Telehealth: Payer: Self-pay | Admitting: *Deleted

## 2014-09-10 ENCOUNTER — Other Ambulatory Visit: Payer: Self-pay | Admitting: Cardiology

## 2014-09-10 NOTE — Telephone Encounter (Signed)
Medication samples have been provided to the patient.  Drug name: eliquis 2.5mg  (takes 5mg )  Qty: 84  LOT: [id number]  Exp.Date: 01/2016  Samples left at front desk for patient pick-up. Patient notified.  Sheral Apley M 1:07 PM 09/10/2014

## 2014-09-10 NOTE — Telephone Encounter (Signed)
Patient calling the office for samples of medication:   1.  What medication and dosage are you requesting samples for? Eliquis 5mg    2.  Are you currently out of this medication? Yes   3. Are you requesting samples to get you through until a mail order prescription arrives? No

## 2014-09-10 NOTE — Telephone Encounter (Signed)
Eliquis samples placed at the front desk for patient. 

## 2014-09-16 ENCOUNTER — Telehealth: Payer: Self-pay | Admitting: Physician Assistant

## 2014-09-16 NOTE — Telephone Encounter (Signed)
pre visit letter mailed 09/02/14 °

## 2014-09-20 ENCOUNTER — Telehealth: Payer: Self-pay | Admitting: *Deleted

## 2014-09-20 NOTE — Telephone Encounter (Signed)
Unable to reach patient at time of Pre-Visit Call.  Left message for patient to return call when available.    

## 2014-09-22 ENCOUNTER — Encounter: Payer: Self-pay | Admitting: Physician Assistant

## 2014-09-22 ENCOUNTER — Ambulatory Visit (INDEPENDENT_AMBULATORY_CARE_PROVIDER_SITE_OTHER): Payer: Medicare Other | Admitting: Physician Assistant

## 2014-09-22 VITALS — BP 120/72 | HR 85 | Temp 97.9°F | Resp 16 | Ht 72.0 in | Wt 215.5 lb

## 2014-09-22 DIAGNOSIS — G8929 Other chronic pain: Secondary | ICD-10-CM

## 2014-09-22 DIAGNOSIS — Z125 Encounter for screening for malignant neoplasm of prostate: Secondary | ICD-10-CM

## 2014-09-22 DIAGNOSIS — Z136 Encounter for screening for cardiovascular disorders: Secondary | ICD-10-CM

## 2014-09-22 DIAGNOSIS — I10 Essential (primary) hypertension: Secondary | ICD-10-CM

## 2014-09-22 DIAGNOSIS — Z0000 Encounter for general adult medical examination without abnormal findings: Secondary | ICD-10-CM | POA: Diagnosis not present

## 2014-09-22 DIAGNOSIS — I482 Chronic atrial fibrillation, unspecified: Secondary | ICD-10-CM

## 2014-09-22 DIAGNOSIS — M25511 Pain in right shoulder: Secondary | ICD-10-CM

## 2014-09-22 LAB — COMPREHENSIVE METABOLIC PANEL
ALBUMIN: 4 g/dL (ref 3.5–5.2)
ALK PHOS: 58 U/L (ref 39–117)
ALT: 13 U/L (ref 0–53)
AST: 21 U/L (ref 0–37)
BILIRUBIN TOTAL: 2.2 mg/dL — AB (ref 0.2–1.2)
BUN: 19 mg/dL (ref 6–23)
CO2: 31 mEq/L (ref 19–32)
Calcium: 9.7 mg/dL (ref 8.4–10.5)
Chloride: 105 mEq/L (ref 96–112)
Creatinine, Ser: 1.54 mg/dL — ABNORMAL HIGH (ref 0.40–1.50)
GFR: 47.22 mL/min — AB (ref >60.00)
Glucose, Bld: 94 mg/dL (ref 70–99)
Potassium: 4.7 mEq/L (ref 3.5–5.1)
SODIUM: 147 meq/L — AB (ref 135–145)
TOTAL PROTEIN: 6.5 g/dL (ref 6.0–8.3)

## 2014-09-22 LAB — URINALYSIS, ROUTINE W REFLEX MICROSCOPIC
Bilirubin Urine: NEGATIVE
Ketones, ur: NEGATIVE
Leukocytes, UA: NEGATIVE
Nitrite: NEGATIVE
Total Protein, Urine: 30 — AB
Urine Glucose: NEGATIVE
Urobilinogen, UA: 0.2 (ref 0.0–1.0)
pH: 5.5 (ref 5.0–8.0)

## 2014-09-22 LAB — CBC
HCT: 51.6 % (ref 39.0–52.0)
HEMOGLOBIN: 17.4 g/dL — AB (ref 13.0–17.0)
MCHC: 33.7 g/dL (ref 30.0–36.0)
MCV: 87.2 fl (ref 78.0–100.0)
Platelets: 208 10*3/uL (ref 150.0–400.0)
RBC: 5.92 Mil/uL — AB (ref 4.22–5.81)
RDW: 14.4 % (ref 11.5–15.5)
WBC: 9.7 10*3/uL (ref 4.0–10.5)

## 2014-09-22 LAB — LIPID PANEL
CHOLESTEROL: 164 mg/dL (ref 0–200)
HDL: 31.6 mg/dL — AB (ref >39.00)
LDL CALC: 100 mg/dL — AB (ref 0–99)
NonHDL: 132.73
TRIGLYCERIDES: 164 mg/dL — AB (ref 0.0–149.0)
Total CHOL/HDL Ratio: 5
VLDL: 32.8 mg/dL (ref 0.0–40.0)

## 2014-09-22 LAB — PSA, MEDICARE: PSA: 3.21 ng/ml (ref 0.10–4.00)

## 2014-09-22 MED ORDER — FUROSEMIDE 20 MG PO TABS: 20.0000 mg | ORAL_TABLET | Freq: Every day | ORAL | Status: DC

## 2014-09-22 MED ORDER — AMLODIPINE BESYLATE 5 MG PO TABS: 5.0000 mg | ORAL_TABLET | Freq: Every day | ORAL | Status: DC

## 2014-09-22 MED ORDER — METOPROLOL SUCCINATE ER 100 MG PO TB24: ORAL_TABLET | ORAL | Status: DC

## 2014-09-22 NOTE — Progress Notes (Signed)
Pre visit review using our clinic review tool, if applicable. No additional management support is needed unless otherwise documented below in the visit note/SLS  

## 2014-09-22 NOTE — Patient Instructions (Signed)
Please go to the lab for blood work. I will call you with your results.  Please follow-up with Cardiology as scheduled.  Schedule an appointment with an optometrist to get your vision checked in-depth.  I believe you are developing some cataracts.  Go downstairs for x-ray. I will notify you of results. Avoid heavy lifting. Continue pain medication as directed.  Follow-up with me in 6 months.  Preventive Care for Adults A healthy lifestyle and preventive care can promote health and wellness. Preventive health guidelines for men include the following key practices:  A routine yearly physical is a good way to check with your health care provider about your health and preventative screening. It is a chance to share any concerns and updates on your health and to receive a thorough exam.  Visit your dentist for a routine exam and preventative care every 6 months. Brush your teeth twice a day and floss once a day. Good oral hygiene prevents tooth decay and gum disease.  The frequency of eye exams is based on your age, health, family medical history, use of contact lenses, and other factors. Follow your health care provider's recommendations for frequency of eye exams.  Eat a healthy diet. Foods such as vegetables, fruits, whole grains, low-fat dairy products, and lean protein foods contain the nutrients you need without too many calories. Decrease your intake of foods high in solid fats, added sugars, and salt. Eat the right amount of calories for you.Get information about a proper diet from your health care provider, if necessary.  Regular physical exercise is one of the most important things you can do for your health. Most adults should get at least 150 minutes of moderate-intensity exercise (any activity that increases your heart rate and causes you to sweat) each week. In addition, most adults need muscle-strengthening exercises on 2 or more days a week.  Maintain a healthy weight. The body  mass index (BMI) is a screening tool to identify possible weight problems. It provides an estimate of body fat based on height and weight. Your health care provider can find your BMI and can help you achieve or maintain a healthy weight.For adults 20 years and older:  A BMI below 18.5 is considered underweight.  A BMI of 18.5 to 24.9 is normal.  A BMI of 25 to 29.9 is considered overweight.  A BMI of 30 and above is considered obese.  Maintain normal blood lipids and cholesterol levels by exercising and minimizing your intake of saturated fat. Eat a balanced diet with plenty of fruit and vegetables. Blood tests for lipids and cholesterol should begin at age 27 and be repeated every 5 years. If your lipid or cholesterol levels are high, you are over 50, or you are at high risk for heart disease, you may need your cholesterol levels checked more frequently.Ongoing high lipid and cholesterol levels should be treated with medicines if diet and exercise are not working.  If you smoke, find out from your health care provider how to quit. If you do not use tobacco, do not start.  Lung cancer screening is recommended for adults aged 93-80 years who are at high risk for developing lung cancer because of a history of smoking. A yearly low-dose CT scan of the lungs is recommended for people who have at least a 30-pack-year history of smoking and are a current smoker or have quit within the past 15 years. A pack year of smoking is smoking an average of 1 pack of cigarettes  a day for 1 year (for example: 1 pack a day for 30 years or 2 packs a day for 15 years). Yearly screening should continue until the smoker has stopped smoking for at least 15 years. Yearly screening should be stopped for people who develop a health problem that would prevent them from having lung cancer treatment.  If you choose to drink alcohol, do not have more than 2 drinks per day. One drink is considered to be 12 ounces (355 mL) of  beer, 5 ounces (148 mL) of wine, or 1.5 ounces (44 mL) of liquor.  Avoid use of street drugs. Do not share needles with anyone. Ask for help if you need support or instructions about stopping the use of drugs.  High blood pressure causes heart disease and increases the risk of stroke. Your blood pressure should be checked at least every 1-2 years. Ongoing high blood pressure should be treated with medicines, if weight loss and exercise are not effective.  If you are 56-62 years old, ask your health care provider if you should take aspirin to prevent heart disease.  Diabetes screening involves taking a blood sample to check your fasting blood sugar level. This should be done once every 3 years, after age 49, if you are within normal weight and without risk factors for diabetes. Testing should be considered at a younger age or be carried out more frequently if you are overweight and have at least 1 risk factor for diabetes.  Colorectal cancer can be detected and often prevented. Most routine colorectal cancer screening begins at the age of 45 and continues through age 25. However, your health care provider may recommend screening at an earlier age if you have risk factors for colon cancer. On a yearly basis, your health care provider may provide home test kits to check for hidden blood in the stool. Use of a small camera at the end of a tube to directly examine the colon (sigmoidoscopy or colonoscopy) can detect the earliest forms of colorectal cancer. Talk to your health care provider about this at age 58, when routine screening begins. Direct exam of the colon should be repeated every 5-10 years through age 36, unless early forms of precancerous polyps or small growths are found.  People who are at an increased risk for hepatitis B should be screened for this virus. You are considered at high risk for hepatitis B if:  You were born in a country where hepatitis B occurs often. Talk with your health care  provider about which countries are considered high risk.  Your parents were born in a high-risk country and you have not received a shot to protect against hepatitis B (hepatitis B vaccine).  You have HIV or AIDS.  You use needles to inject street drugs.  You live with, or have sex with, someone who has hepatitis B.  You are a man who has sex with other men (MSM).  You get hemodialysis treatment.  You take certain medicines for conditions such as cancer, organ transplantation, and autoimmune conditions.  Hepatitis C blood testing is recommended for all people born from [birth year] through 1965 and any individual with known risks for hepatitis C.  Practice safe sex. Use condoms and avoid high-risk sexual practices to reduce the spread of sexually transmitted infections (STIs). STIs include gonorrhea, chlamydia, syphilis, trichomonas, herpes, HPV, and human immunodeficiency virus (HIV). Herpes, HIV, and HPV are viral illnesses that have no cure. They can result in disability, cancer, and death.  If you are at risk of being infected with HIV, it is recommended that you take a prescription medicine daily to prevent HIV infection. This is called preexposure prophylaxis (PrEP). You are considered at risk if:  You are a man who has sex with other men (MSM) and have other risk factors.  You are a heterosexual man, are sexually active, and are at increased risk for HIV infection.  You take drugs by injection.  You are sexually active with a partner who has HIV.  Talk with your health care provider about whether you are at high risk of being infected with HIV. If you choose to begin PrEP, you should first be tested for HIV. You should then be tested every 3 months for as long as you are taking PrEP.  A one-time screening for abdominal aortic aneurysm (AAA) and surgical repair of large AAAs by ultrasound are recommended for men ages 29 to 91 years who are current or former smokers.  Healthy men  should no longer receive prostate-specific antigen (PSA) blood tests as part of routine cancer screening. Talk with your health care provider about prostate cancer screening.  Testicular cancer screening is not recommended for adult males who have no symptoms. Screening includes self-exam, a health care provider exam, and other screening tests. Consult with your health care provider about any symptoms you have or any concerns you have about testicular cancer.  Use sunscreen. Apply sunscreen liberally and repeatedly throughout the day. You should seek shade when your shadow is shorter than you. Protect yourself by wearing long sleeves, pants, a wide-brimmed hat, and sunglasses year round, whenever you are outdoors.  Once a month, do a whole-body skin exam, using a mirror to look at the skin on your back. Tell your health care provider about new moles, moles that have irregular borders, moles that are larger than a pencil eraser, or moles that have changed in shape or color.  Stay current with required vaccines (immunizations).  Influenza vaccine. All adults should be immunized every year.  Tetanus, diphtheria, and acellular pertussis (Td, Tdap) vaccine. An adult who has not previously received Tdap or who does not know his vaccine status should receive 1 dose of Tdap. This initial dose should be followed by tetanus and diphtheria toxoids (Td) booster doses every 10 years. Adults with an unknown or incomplete history of completing a 3-dose immunization series with Td-containing vaccines should begin or complete a primary immunization series including a Tdap dose. Adults should receive a Td booster every 10 years.  Varicella vaccine. An adult without evidence of immunity to varicella should receive 2 doses or a second dose if he has previously received 1 dose.  Human papillomavirus (HPV) vaccine. Males aged 8-21 years who have not received the vaccine previously should receive the 3-dose series. Males  aged 22-26 years may be immunized. Immunization is recommended through the age of 50 years for any male who has sex with males and did not get any or all doses earlier. Immunization is recommended for any person with an immunocompromised condition through the age of 56 years if he did not get any or all doses earlier. During the 3-dose series, the second dose should be obtained 4-8 weeks after the first dose. The third dose should be obtained 24 weeks after the first dose and 16 weeks after the second dose.  Zoster vaccine. One dose is recommended for adults aged 107 years or older unless certain conditions are present.  Measles, mumps, and rubella (MMR)  vaccine. Adults born before [birth year] generally are considered immune to measles and mumps. Adults born in [birth year] or later should have 1 or more doses of MMR vaccine unless there is a contraindication to the vaccine or there is laboratory evidence of immunity to each of the three diseases. A routine second dose of MMR vaccine should be obtained at least 28 days after the first dose for students attending postsecondary schools, health care workers, or international travelers. People who received inactivated measles vaccine or an unknown type of measles vaccine during 1963-1967 should receive 2 doses of MMR vaccine. People who received inactivated mumps vaccine or an unknown type of mumps vaccine before 1979 and are at high risk for mumps infection should consider immunization with 2 doses of MMR vaccine. Unvaccinated health care workers born before [birth year] who lack laboratory evidence of measles, mumps, or rubella immunity or laboratory confirmation of disease should consider measles and mumps immunization with 2 doses of MMR vaccine or rubella immunization with 1 dose of MMR vaccine.  Pneumococcal 13-valent conjugate (PCV13) vaccine. When indicated, a person who is uncertain of his immunization history and has no record of immunization should receive the PCV13 vaccine.  An adult aged 58 years or older who has certain medical conditions and has not been previously immunized should receive 1 dose of PCV13 vaccine. This PCV13 should be followed with a dose of pneumococcal polysaccharide (PPSV23) vaccine. The PPSV23 vaccine dose should be obtained at least 8 weeks after the dose of PCV13 vaccine. An adult aged 65 years or older who has certain medical conditions and previously received 1 or more doses of PPSV23 vaccine should receive 1 dose of PCV13. The PCV13 vaccine dose should be obtained 1 or more years after the last PPSV23 vaccine dose.  Pneumococcal polysaccharide (PPSV23) vaccine. When PCV13 is also indicated, PCV13 should be obtained first. All adults aged 68 years and older should be immunized. An adult younger than age 26 years who has certain medical conditions should be immunized. Any person who resides in a nursing home or long-term care facility should be immunized. An adult smoker should be immunized. People with an immunocompromised condition and certain other conditions should receive both PCV13 and PPSV23 vaccines. People with human immunodeficiency virus (HIV) infection should be immunized as soon as possible after diagnosis. Immunization during chemotherapy or radiation therapy should be avoided. Routine use of PPSV23 vaccine is not recommended for American Indians, [place] Natives, or people younger than 65 years unless there are medical conditions that require PPSV23 vaccine. When indicated, people who have unknown immunization and have no record of immunization should receive PPSV23 vaccine. One-time revaccination 5 years after the first dose of PPSV23 is recommended for people aged 19-64 years who have chronic kidney failure, nephrotic syndrome, asplenia, or immunocompromised conditions. People who received 1-2 doses of PPSV23 before age 28 years should receive another dose of PPSV23 vaccine at age 42 years or later if at least 5 years have passed since the  previous dose. Doses of PPSV23 are not needed for people immunized with PPSV23 at or after age 72 years.  Meningococcal vaccine. Adults with asplenia or persistent complement component deficiencies should receive 2 doses of quadrivalent meningococcal conjugate (MenACWY-D) vaccine. The doses should be obtained at least 2 months apart. Microbiologists working with certain meningococcal bacteria, [place] recruits, people at risk during an outbreak, and people who travel to or live in countries with a high rate of meningitis should be immunized. A Market researcher up  through age 57 years who is living in a residence hall should receive a dose if he did not receive a dose on or after his 16th birthday. Adults who have certain high-risk conditions should receive one or more doses of vaccine.  Hepatitis A vaccine. Adults who wish to be protected from this disease, have certain high-risk conditions, work with hepatitis A-infected animals, work in hepatitis A research labs, or travel to or work in countries with a high rate of hepatitis A should be immunized. Adults who were previously unvaccinated and who anticipate close contact with an international adoptee during the first 60 days after arrival in the [place] from a country with a high rate of hepatitis A should be immunized.  Hepatitis B vaccine. Adults should be immunized if they wish to be protected from this disease, have certain high-risk conditions, may be exposed to blood or other infectious body fluids, are household contacts or sex partners of hepatitis B positive people, are clients or workers in certain care facilities, or travel to or work in countries with a high rate of hepatitis B.  Haemophilus influenzae type b (Hib) vaccine. A previously unvaccinated person with asplenia or sickle cell disease or having a scheduled splenectomy should receive 1 dose of Hib vaccine. Regardless of previous immunization, a recipient of a  hematopoietic stem cell transplant should receive a 3-dose series 6-12 months after his successful transplant. Hib vaccine is not recommended for adults with HIV infection. Preventive Service / Frequency Ages 29 to 43  Blood pressure check.** / Every 1 to 2 years.  Lipid and cholesterol check.** / Every 5 years beginning at age 88.  Hepatitis C blood test.** / For any individual with known risks for hepatitis C.  Skin self-exam. / Monthly.  Influenza vaccine. / Every year.  Tetanus, diphtheria, and acellular pertussis (Tdap, Td) vaccine.** / Consult your health care provider. 1 dose of Td every 10 years.  Varicella vaccine.** / Consult your health care provider.  HPV vaccine. / 3 doses over 6 months, if 19 or younger.  Measles, mumps, rubella (MMR) vaccine.** / You need at least 1 dose of MMR if you were born in 1956 or later. You may also need a second dose.  Pneumococcal 13-valent conjugate (PCV13) vaccine.** / Consult your health care provider.  Pneumococcal polysaccharide (PPSV23) vaccine.** / 1 to 2 doses if you smoke cigarettes or if you have certain conditions.  Meningococcal vaccine.** / 1 dose if you are age 69 to 62 years and a Market researcher living in a residence hall, or have one of several medical conditions. You may also need additional booster doses.  Hepatitis A vaccine.** / Consult your health care provider.  Hepatitis B vaccine.** / Consult your health care provider.  Haemophilus influenzae type b (Hib) vaccine.** / Consult your health care provider. Ages 8 to 54  Blood pressure check.** / Every 1 to 2 years.  Lipid and cholesterol check.** / Every 5 years beginning at age 63.  Lung cancer screening. / Every year if you are aged 51-80 years and have a 30-pack-year history of smoking and currently smoke or have quit within the past 15 years. Yearly screening is stopped once you have quit smoking for at least 15 years or develop a health problem  that would prevent you from having lung cancer treatment.  Fecal occult blood test (FOBT) of stool. / Every year beginning at age 67 and continuing until age 46. You may not have to do this  test if you get a colonoscopy every 10 years.  Flexible sigmoidoscopy** or colonoscopy.** / Every 5 years for a flexible sigmoidoscopy or every 10 years for a colonoscopy beginning at age 45 and continuing until age 59.  Hepatitis C blood test.** / For all people born from [birth year] through 1965 and any individual with known risks for hepatitis C.  Skin self-exam. / Monthly.  Influenza vaccine. / Every year.  Tetanus, diphtheria, and acellular pertussis (Tdap/Td) vaccine.** / Consult your health care provider. 1 dose of Td every 10 years.  Varicella vaccine.** / Consult your health care provider.  Zoster vaccine.** / 1 dose for adults aged 9 years or older.  Measles, mumps, rubella (MMR) vaccine.** / You need at least 1 dose of MMR if you were born in 1956 or later. You may also need a second dose.  Pneumococcal 13-valent conjugate (PCV13) vaccine.** / Consult your health care provider.  Pneumococcal polysaccharide (PPSV23) vaccine.** / 1 to 2 doses if you smoke cigarettes or if you have certain conditions.  Meningococcal vaccine.** / Consult your health care provider.  Hepatitis A vaccine.** / Consult your health care provider.  Hepatitis B vaccine.** / Consult your health care provider.  Haemophilus influenzae type b (Hib) vaccine.** / Consult your health care provider. Ages 55 and over  Blood pressure check.** / Every 1 to 2 years.  Lipid and cholesterol check.**/ Every 5 years beginning at age 6.  Lung cancer screening. / Every year if you are aged 1-80 years and have a 30-pack-year history of smoking and currently smoke or have quit within the past 15 years. Yearly screening is stopped once you have quit smoking for at least 15 years or develop a health problem that would prevent you from  having lung cancer treatment.  Fecal occult blood test (FOBT) of stool. / Every year beginning at age 70 and continuing until age 55. You may not have to do this test if you get a colonoscopy every 10 years.  Flexible sigmoidoscopy** or colonoscopy.** / Every 5 years for a flexible sigmoidoscopy or every 10 years for a colonoscopy beginning at age 59 and continuing until age 29.  Hepatitis C blood test.** / For all people born from [birth year] through 1964 and any individual with known risks for hepatitis C.  Abdominal aortic aneurysm (AAA) screening.** / A one-time screening for ages 26 to 20 years who are current or former smokers.  Skin self-exam. / Monthly.  Influenza vaccine. / Every year.  Tetanus, diphtheria, and acellular pertussis (Tdap/Td) vaccine.** / 1 dose of Td every 10 years.  Varicella vaccine.** / Consult your health care provider.  Zoster vaccine.** / 1 dose for adults aged 53 years or older.  Pneumococcal 13-valent conjugate (PCV13) vaccine.** / Consult your health care provider.  Pneumococcal polysaccharide (PPSV23) vaccine.** / 1 dose for all adults aged 43 years and older.  Meningococcal vaccine.** / Consult your health care provider.  Hepatitis A vaccine.** / Consult your health care provider.  Hepatitis B vaccine.** / Consult your health care provider.  Haemophilus influenzae type b (Hib) vaccine.** / Consult your health care provider. **Family history and personal history of risk and conditions may change your health care provider's recommendations. Document Released: 03/13/2001 Document Revised: 01/20/2013 Document Reviewed: 06/12/2010 [facility] Patient Information 2015 [place], [place]. This information is not intended to replace advice given to you by your health care provider. Make sure you discuss any questions you have with your health care provider.

## 2014-09-22 NOTE — Progress Notes (Signed)
Subjective:    [name] Woods is a 74 y.o. male who presents for Medicare Annual/Subsequent preventive examination.   Preventive Screening-Counseling & Management  Tobacco History  Smoking status  . Never Smoker   Smokeless tobacco  . Never Used    Problems Prior to Visit 1.Atrial Fibrilaltion -- Followed by Cardiology, last seen December 2015. Has upcoming appointment with Dr. Percival Spanish. Denies chest pain, shortness of breath. Is taking medications as directed rate control. Is taking Elavil causes daily as directed.  2. Patient complains of continued right shoulder pain with decreased range of motion over the past several months. Denies numbness, tingling, weakness or radiation of pain. Denies trauma or injury. Has been taking hydrocodone every 6 hours with some relief of symptoms.  Current Problems (verified) Patient Active Problem List   Diagnosis Date Noted  . Lung nodule, multiple 08/24/2014  . Allergic dermatitis 08/08/2014  . Emphysema of lung 07/28/2014  . Gout 06/30/2014  . Pain in joint, ankle and foot 06/24/2014  . Atypical pigmented skin lesion 05/09/2014  . Chronic left flank pain 02/04/2014  . Chronic cough 04/13/2012  . Cough 03/21/2012  . Paroxysmal atrial fibrillation 03/21/2012  . CAD (coronary artery disease) 03/21/2012  . GERD (gastroesophageal reflux disease) 03/21/2012  . Dyspnea on exertion 03/21/2012  . Fatigue 03/21/2012  . Bilateral knee pain 11/12/2011  . Gout attack 03/05/2011  . Asthma exacerbation, allergic 09/21/2010  . Olecranon bursitis of left elbow 06/12/2010  . THYROID FUNCTION TEST, ABNORMAL 04/14/2009  . COLONIC POLYPS, ADENOMATOUS 04/25/2007  . Essential hypertension 04/25/2007  . VENTRICULAR HYPERTROPHY, LEFT 04/25/2007  . HIATAL HERNIA 04/25/2007  . ARTHRITIS 04/25/2007  . SLEEP APNEA 04/25/2007  . CHOLELITHIASIS, HX OF 04/25/2007  . NEPHROLITHIASIS, HX OF 04/25/2007  . Pain in joint, lower leg 10/15/2006  . Ankylosing  spondylitis 09/04/2006  . BACKACHE NOS 09/04/2006    Medications Prior to Visit Current Outpatient Prescriptions on File Prior to Visit  Medication Sig Dispense Refill  . amLODipine (NORVASC) 5 MG tablet Take 1 tablet (5 mg total) by mouth daily. 30 tablet 5  . apixaban (ELIQUIS) 5 MG TABS tablet Take 1 tablet (5 mg total) by mouth 2 (two) times daily. 14 tablet 0  . diltiazem (CARDIZEM CD) 180 MG 24 hr capsule Take 1 capsule (180 mg total) by mouth daily. 15 capsule 0  . furosemide (LASIX) 20 MG tablet Take 1 tablet (20 mg total) by mouth daily. 30 tablet 5  . HYDROcodone-acetaminophen (NORCO) 10-325 MG per tablet Take 1 tablet by mouth every 6 (six) hours as needed. 120 tablet 0  . Loperamide HCl (IMODIUM PO) Take by mouth as needed.      . metoprolol succinate (TOPROL-XL) 100 MG 24 hr tablet TAKE 1 TABLET BY MOUTH 2 TIMES DAILY. 60 tablet 5   No current facility-administered medications on file prior to visit.    Current Medications (verified) Current Outpatient Prescriptions  Medication Sig Dispense Refill  . amLODipine (NORVASC) 5 MG tablet Take 1 tablet (5 mg total) by mouth daily. 30 tablet 5  . apixaban (ELIQUIS) 5 MG TABS tablet Take 1 tablet (5 mg total) by mouth 2 (two) times daily. 14 tablet 0  . diltiazem (CARDIZEM CD) 180 MG 24 hr capsule Take 1 capsule (180 mg total) by mouth daily. 15 capsule 0  . furosemide (LASIX) 20 MG tablet Take 1 tablet (20 mg total) by mouth daily. 30 tablet 5  . HYDROcodone-acetaminophen (NORCO) 10-325 MG per tablet Take 1 tablet by  mouth every 6 (six) hours as needed. 120 tablet 0  . Loperamide HCl (IMODIUM PO) Take by mouth as needed.      . metoprolol succinate (TOPROL-XL) 100 MG 24 hr tablet TAKE 1 TABLET BY MOUTH 2 TIMES DAILY. 60 tablet 5   No current facility-administered medications for this visit.    Allergies (verified) Coumadin   PAST HISTORY  Family History Family History  Problem Relation Age of Onset  . Colon cancer Neg Hx    . Heart disease Father 70    Deceased  . Heart attack Father 71  . Heart disease Brother 32    "Died in his sleep"  . Arthritis/Rheumatoid Mother     Deceased-86  . Dementia Paternal Grandmother   . Cancer Paternal Uncle   . Hyperlipidemia Brother   . Stroke Brother 55    Deceased  . Other Sister     Estate agent in [place]  . Other Sister     MVA  . Other Brother     Carjacked-killed  . Healthy Brother   . Healthy Son     X3  . Healthy Daughter     x1    Social History Social History  Substance Use Topics  . Smoking status: Never Smoker   . Smokeless tobacco: Never Used  . Alcohol Use: No   Are there smokers in your home (other than you)?  No  Risk Factors Current exercise habits: Home exercise routine includes walking daily Dietary issues discussed: Endorses well-balanced diet. Body mass index is 29.22 kg/(m^2).  Cardiac risk factors: advanced age (older than 55 for men, 68 for women), dyslipidemia, family history of premature cardiovascular disease, hypertension, male gender and sedentary lifestyle.  Depression Screen (Note: if answer to either of the following is "Yes", a more complete depression screening is indicated)   Q1: Over the past two weeks, have you felt down, depressed or hopeless? No  Q2: Over the past two weeks, have you felt little interest or pleasure in doing things? No  Have you lost interest or pleasure in daily life? No  Do you often feel hopeless? No  Do you cry easily over simple problems? No  Activities of Daily Living In your present state of health, do you have any difficulty performing the following activities?:  Driving? No Managing money?  No Feeding yourself? No Getting from bed to chair? No Climbing a flight of stairs? No Preparing food and eating?: No Bathing or showering? No Getting dressed: No Getting to the toilet? No Using the toilet:No Moving around from place to place: No In the past year have you fallen or had a near  fall?:No   Are you sexually active?  Yes  Do you have more than one partner?  No  Hearing Difficulties: No Do you often ask people to speak up or repeat themselves? No Do you experience ringing or noises in your ears? No Do you have difficulty understanding soft or whispered voices? No   Do you feel that you have a problem with memory? No  Do you often misplace items? No  Do you feel safe at home?  Yes  Cognitive Testing  Alert? Yes  Normal Appearance?Yes  Oriented to person? Yes  Place? Yes   Time? Yes  Recall of three objects?  Yes  Can perform simple calculations? Yes  Displays appropriate judgment?Yes   Advanced Directives have been discussed with the patient? Yes   List the Names of Other Physician/Practitioners you currently use: 1.  Dr. Percival Spanish -- Cardiology  Indicate any recent Medical Services you may have received from other than [facility] providers in the past year (date may be approximate).  Immunization History  Administered Date(s) Administered  . Influenza Split 11/30/2011  . Influenza Whole 11/10/2009  . Influenza, High Dose Seasonal PF 12/02/2012, 11/07/2013  . Pneumococcal Conjugate-13 05/03/2014  . Td 01/30/2004  . Tdap 05/03/2014    Screening Tests Health Maintenance  Topic Date Due  . ZOSTAVAX  02/10/2001  . COLONOSCOPY  03/16/2003  . INFLUENZA VACCINE  08/30/2014  . PNA vac Low Risk Adult (2 of 2 - PPSV23) 05/03/2015  . TETANUS/TDAP  05/02/2024    All answers were reviewed with the patient and necessary referrals were made:  Leeanne Rio, PA-C   09/22/2014   History reviewed: allergies, current medications, past family history, past medical history, past social history, past surgical history and problem list  Review of Systems A comprehensive review of systems was negative.    Objective:     Vision by Snellen chart: right eye:20/40, left eye:20/200 Height 6' (1.829 m), weight 215 lb 8 oz (97.75 kg). Body mass index is 29.22  kg/(m^2).  General appearance: alert, cooperative, appears stated age and no distress Head: Normocephalic, without obvious abnormality, atraumatic Eyes: conjunctivae/corneas clear. PERRL, EOM's intact. Fundi benign. Ears: normal TM's and external ear canals both ears Nose: Nares normal. Septum midline. Mucosa normal. No drainage or sinus tenderness. Throat: lips, mucosa, and tongue normal; teeth and gums normal Lungs: clear to auscultation bilaterally Heart: irregularly irregular rhythm Abdomen: soft, non-tender; bowel sounds normal; no masses,  no organomegaly     Assessment:     (1) Medicare Wellness, Subsequent (2) Atrial Fibrillation, rate-controlled. (3) Chronic R shoulder pain (4) Decreased Visual Acuity of L eye.      Plan:     (1) During the course of the visit the patient was educated and counseled about appropriate screening and preventive services including:    Screening electrocardiogram  Prostate cancer screening  Colorectal cancer screening  Advanced directives: has an advanced directive - a copy has been provided, has an advanced directive - a copy HAS NOT been provided.  Diet review for nutrition referral? Yes ____  Not Indicated _x___  Will obtain fasting labs today.  (2) EKG with atrial fibrillation with rate controlled. Continue current regimen. Follow-up with cardiology this week as scheduled.  (3) Will obtain x-ray and refer to orthopedics. Continue pain medication as directed. Avoid heavy lifting or over exertion.  (4) patient encouraged to schedule an appointment with optometry to have his vision checked.   Patient Instructions (the written plan) was given to the patient.  Medicare Attestation I have personally reviewed: The patient's medical and social history Their use of alcohol, tobacco or illicit drugs Their current medications and supplements The patient's functional ability including ADLs,fall risks, home safety risks, cognitive,  and hearing and visual impairment Diet and physical activities Evidence for depression or mood disorders  The patient's weight, height, BMI, and visual acuity have been recorded in the chart.  I have made referrals, counseling, and provided education to the patient based on review of the above and I have provided the patient with a written personalized care plan for preventive services.     Raiford Noble [place], [place]   09/22/2014

## 2014-09-23 ENCOUNTER — Encounter: Payer: Self-pay | Admitting: Cardiology

## 2014-09-23 ENCOUNTER — Telehealth: Payer: Self-pay | Admitting: *Deleted

## 2014-09-23 ENCOUNTER — Ambulatory Visit (INDEPENDENT_AMBULATORY_CARE_PROVIDER_SITE_OTHER): Payer: Medicare Other | Admitting: Cardiology

## 2014-09-23 VITALS — BP 114/50 | HR 77 | Ht 72.0 in | Wt 216.0 lb

## 2014-09-23 DIAGNOSIS — I480 Paroxysmal atrial fibrillation: Secondary | ICD-10-CM | POA: Diagnosis not present

## 2014-09-23 DIAGNOSIS — I2510 Atherosclerotic heart disease of native coronary artery without angina pectoris: Secondary | ICD-10-CM

## 2014-09-23 MED ORDER — DILTIAZEM HCL ER COATED BEADS 180 MG PO CP24: 180.0000 mg | ORAL_CAPSULE | Freq: Every day | ORAL | Status: DC

## 2014-09-23 MED ORDER — APIXABAN 5 MG PO TABS: 5.0000 mg | ORAL_TABLET | Freq: Two times a day (BID) | ORAL | Status: DC

## 2014-09-23 NOTE — Progress Notes (Signed)
    09/23/2014 [name] Woods   [birth year]  [id number]  Primary Physician Leeanne Rio, PA-C   HPI:  The patient is a 74 year old male, who is followed for persistent atrial fibrillation.His history is also notable for mild nonobstructive coronary disease by [facility] 03/24/2012. Ejection fraction at that time was mildly reduced at 45%. However, repeat 2-D echocardiogram 07/02/2013 revealed improved systolic function with an estimated ejection fraction of 55%.  He recently had a pulmonary workup and was found to have some coronary calcium. However, he's had no new cardiovascular symptoms. He works in Information systems manager. He can do this without shortness of breath, PND or orthopnea. He doesn't notice his fibrillation. He's had no presyncope or syncope and doesn't feel palpitations. He's had no bleeding issues.   Current Outpatient Prescriptions  Medication Sig Dispense Refill  . amLODipine (NORVASC) 5 MG tablet Take 1 tablet (5 mg total) by mouth daily. (Patient taking differently: Take 5 mg by mouth as needed. ) 30 tablet 5  . apixaban (ELIQUIS) 5 MG TABS tablet Take 1 tablet (5 mg total) by mouth 2 (two) times daily. 14 tablet 0  . diltiazem (CARDIZEM CD) 180 MG 24 hr capsule Take 1 capsule (180 mg total) by mouth daily. 15 capsule 0  . furosemide (LASIX) 20 MG tablet Take 1 tablet (20 mg total) by mouth daily. 30 tablet 5  . HYDROcodone-acetaminophen (NORCO) 10-325 MG per tablet Take 1 tablet by mouth every 6 (six) hours as needed. 120 tablet 0  . Loperamide HCl (IMODIUM PO) Take by mouth as needed.      . metoprolol succinate (TOPROL-XL) 100 MG 24 hr tablet TAKE 1 TABLET BY MOUTH 2 TIMES DAILY. 60 tablet 5  . predniSONE (DELTASONE) 20 MG tablet Take 20 mg by mouth as needed.     No current facility-administered medications for this visit.    Allergies  Allergen Reactions  . Coumadin [Warfarin]     GI bleeds   ROS:  As stated in the HPI and negative for all other  systems.  EXAM: Blood pressure 114/50, pulse 77, height 6' (1.829 m), weight 216 lb (97.977 kg).  GENERAL:  Well appearing HEENT:  Pupils equal round and reactive, fundi not visualized, oral mucosa unremarkable, poor dentition NECK:  No jugular venous distention, waveform within normal limits, carotid upstroke brisk and symmetric, no bruits, no thyromegaly LUNGS:  Clear to auscultation bilaterally CHEST:  Unremarkable HEART:  PMI not displaced or sustained,S1 and S2 within normal limits, no S3, no S4, no clicks, no rubs, no murmurs ABD:  Flat, positive bowel sounds normal in frequency in pitch, no bruits, no rebound, no guarding, no midline pulsatile mass, no hepatomegaly, no splenomegaly EXT:  2 plus pulses throughout, no edema, no cyanosis no clubbing    EKG Atrial Fibrillation with a controlled ventricular response 106 bpm .  09/23/2014  ASSESSMENT AND PLAN:   CORONARY CALCIUM :  The patient has no recent or change symptoms since his catheterization. He did have nonobstructive plaque at that time. There was no suggestion now symptomatically that he has obstructive plaque with a further stress testing is indicated. He should continue with risk reduction.  ATRIAL FIB:  EKG demonstrates atrial fibrillation however ventricular rate is well controlled. Continue with metoprolol and Cardizem for rate control. Continued Eliquis BID for stroke prophylaxis.   [facility] 09/23/2014 2:36 PM

## 2014-09-23 NOTE — Telephone Encounter (Signed)
Eliquis samples placed at the front desk for patient. 

## 2014-09-23 NOTE — Patient Instructions (Signed)
Your physician wants you to follow-up in: 1 Year. You will receive a reminder letter in the mail two months in advance. If you don't receive a letter, please call our office to schedule the follow-up appointment.  

## 2014-09-28 ENCOUNTER — Telehealth: Payer: Self-pay | Admitting: Physician Assistant

## 2014-09-28 NOTE — Telephone Encounter (Signed)
Relation to QW:QVLD Call back number:[phone]   Reason for call:  Patient inquiring about lab results

## 2014-09-29 ENCOUNTER — Other Ambulatory Visit: Payer: Self-pay | Admitting: Physician Assistant

## 2014-09-29 DIAGNOSIS — R7989 Other specified abnormal findings of blood chemistry: Secondary | ICD-10-CM

## 2014-09-29 MED ORDER — HYDROCODONE-ACETAMINOPHEN 10-325 MG PO TABS: 1.0000 | ORAL_TABLET | Freq: Four times a day (QID) | ORAL | Status: DC | PRN

## 2014-09-29 NOTE — Telephone Encounter (Signed)
Notes Recorded by Harl Bowie, CMA on 09/27/2014 at 3:24 PM Called and LM asking the pt to RTC regarding recent lab results.//AB/CMA Notes Recorded by Brunetta Jeans, PA-C on 09/26/2014 at 5:15 PM Labs good overall. Renal function is slightly impaired compared to prior checks. Want to repeat CMP in one week (lab only). Come hydrated for blood work. If still abnormal, we will look into this further.

## 2014-10-22 ENCOUNTER — Ambulatory Visit: Payer: Medicare Other | Admitting: Internal Medicine

## 2014-10-22 ENCOUNTER — Telehealth: Payer: Self-pay | Admitting: Cardiology

## 2014-10-22 ENCOUNTER — Ambulatory Visit (INDEPENDENT_AMBULATORY_CARE_PROVIDER_SITE_OTHER): Payer: Medicare Other | Admitting: Internal Medicine

## 2014-10-22 ENCOUNTER — Encounter: Payer: Self-pay | Admitting: Internal Medicine

## 2014-10-22 VITALS — BP 144/82 | HR 87 | Ht 72.0 in | Wt 219.0 lb

## 2014-10-22 DIAGNOSIS — R918 Other nonspecific abnormal finding of lung field: Secondary | ICD-10-CM | POA: Diagnosis not present

## 2014-10-22 DIAGNOSIS — I2510 Atherosclerotic heart disease of native coronary artery without angina pectoris: Secondary | ICD-10-CM | POA: Diagnosis not present

## 2014-10-22 DIAGNOSIS — R053 Chronic cough: Secondary | ICD-10-CM

## 2014-10-22 DIAGNOSIS — R05 Cough: Secondary | ICD-10-CM

## 2014-10-22 DIAGNOSIS — J3489 Other specified disorders of nose and nasal sinuses: Secondary | ICD-10-CM | POA: Diagnosis not present

## 2014-10-22 DIAGNOSIS — Z23 Encounter for immunization: Secondary | ICD-10-CM

## 2014-10-22 MED ORDER — FLUTICASONE PROPIONATE 50 MCG/ACT NA SUSP: 2.0000 | Freq: Every day | NASAL | Status: DC

## 2014-10-22 NOTE — Telephone Encounter (Signed)
Medication samples have been provided to the patient.  Drug name: Eliquis 5 mg Qty: 56 tabs LOT: [id number] Exp.Date: 01/2017  Samples left at front desk for patient pick-up. Patient notified.  [place], [place] 12:57 PM 10/22/2014

## 2014-10-22 NOTE — Patient Instructions (Addendum)
ICD-9-CM ICD-10-CM   1. Chronic cough 786.2 R05   2. Sinus drainage 478.19 J34.89   3. Multiple lung nodules 793.19 R91.8     Glad cough is resolved For sinus drainage:  Take and START generic fluticasone inhaler 2 squirts each nostril daily For nodules : do CT chest in 1 year   FLu shot 10/22/2014  Followup 1 year

## 2014-10-22 NOTE — Telephone Encounter (Signed)
Patient calling the office for samples of medication:   1.  What medication and dosage are you requesting samples for?Eliquis  2.  Are you currently out of this medication?no  3. Are you requesting samples to get you through until a mail order prescription arrives?no

## 2014-10-22 NOTE — Telephone Encounter (Signed)
Pt called back,he was notified samples were at the front des,he will pick the up.

## 2014-10-22 NOTE — Progress Notes (Signed)
Subjective:    Patient ID: [name] Woods, male    DOB: April 06, 1940, 74 y.o.   MRN: [id number]  HPI    HPI  REferred for cough   OV 04/07/2012  He  date distinct increase/ new cough x 6 weeks. He may have had a non-febrile cold near the onset, but no purulent sputum. He did cut holes in a new polyurethane sealed floor w/o respiratory protection at that time. Cough began dry but progressed to productive of thick clear, choking mucus. Worse at night- wakes him 2-3x. Tried wife's asthma inhaler once- helped.   Then admitted with dyspnea as well 03/21/12 to 03/25/12- Noted to be in worseing of preexistent A Fib but with new low EF with echo ef 45% but RHC and LHC normal. ACE inhibitor stopped.  Also, Rx with lasix  (new lasix just prior to the amdission) Lopressor increaesd. There was pulm consult at this time.  Since then cough better 70%.  Currently cough is moderate. Cough currently made worse by answering phone, sleeping/lying down. HE feels theere is mucus in back of throat. Feels tickle in throat with some mild sinus drainage. Cough ? improved by inhalers ; currently on dulera    He reports dx of obstructive sleep apnea by [facility]?) but never treated. Family confirms loud snore    Sinus - mild sinus drainage currently  Allergy - denies spirng allpergies  BP - ACE inhibitor stopped feb 2014; currently in lopresso  GI/GERD Denies GERD Body mass index is 29.18 kg/(m^2).  Pulmonary 06/21/08 - CT abd lung cut - some non specific fibrotic stranding in bases CXR Feb 2014 - no acute proicess  EXposure  reports that he has never smoked. He has never used smokeless tobacco. 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 Denies mold inhouse.   REC Glad cough is much improved after they stopped lisinopril and started on Lasix  Unclear why you're having residual cough  To sort this out  - stop dulera  - Please use 3% hypertonic Milta Deiters med sinus nasal spray that you can get  over-the-counter at [facility] or [facility] to [employer]  - take generic fluticasone inhaler 2 squirts each nostril daily  -Please have full pulmonary function testing within the next 2-3 weeks (after being off dulera) and as soon as finished it please call and let us know so I can review it and call you back about additional breathing test on the CT scan of the chest  #Followup  - Based on results of pulmonary function testing which you need to have within the next 2-3 weeks at any location that is convenient to you   Telephone call 04/18/2012 According to the son. Patient has had a cough approximately 2 months ago and after that the daughter-in-law never cough 6 weeks ago. After that is a family never cough. According to the daughter-in-law is primary care physician Dr. Cindi Carbon he states that he saw daughter-in-law approximately one month ago with cough was given Z-Pak but due to persistence of cough he recently tested her for pertussis and today the result showed IgG and IgM positive. The son is keen that the patient get tested for pertussis. Therefore I have ordered the test.    TElephone call 04/22/12 - pertussis  B Ased on my review of laboratory results he likely had "recent" pertussis infection which means he does not have it now but had it sometime 6 weeks ago to 6-12 months ago. So, it is possible that his  cough when it started could have been due to pertussis but by the time I saw him he was not in a phase where zpak would help. I   LABS in April 2014 CT chest 05/19/2012 is basically normal. Some 4  millimeter nodule seen but this requires followup in one year does not explain the cause of cough.   Pulmonary function test 04/29/2012 is completely normal.    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  He reports that his cough is one third better. In fact his RSI cough score is 23 compared to 32 at prior visit. This correlates with one  third improvement. However cough is still at a level where it is bothersome. He thinks it's the passage of time has helped with improvement of cough. He is not using his nasal steroid oh saline nasal spray as advised. However he continues to be office inhaled corticosteroids. For the details of cough on and the table below  Past, Family, Social reviewed: no change since last visit    OV 08/24/2014  Chief Complaint  Patient presents with  . Follow-up    Pt last seen by MR on 5.13.2014 for cough. Pt has been re-referred by [name], PA for emphysema. Pt c/o dry cough. Pt denies SOB, chest congestion, CP/tightness.    Follow-up chronic cough. Last seen May 2014 just over 2 years ago. At that time he had post pertussis cough. He then was lost to follow-up. He tells me that after that visit 2 years ago cough resolved. Now his cough is returned approximately a few months ago. Insidious onset. Course is stable since onset. It's mild in severity although on the RSI cough score below cough is at least moderate in severity. The quality is dry. It is worse at night. It is better in the daytime. This no specific aggravating or relieving factors other than stated. There is some associated sinus drainage and a tickle in the throat and acid reflux but no wheezing or shortness of breath. Primary care physician was concerned about emphysema but he says he is never smoked.  CT scan of the chest May 2014 personally visualized: Lung fields clear showed multiple small pulmonary nodules less than 4 mm  Pulmonary function test May 2014 on personally visualized image. This is normal.   OV 10/22/2014  Chief Complaint  Patient presents with  . Follow-up    Pt here after CT chest, pt cancelled PFT. Pt states his cough has improved since last OV. Pt states he still has mucus in his throat he tries to clear often. Pt denies SOB and CP/tightness.    Follow-up chronic cough  Reports a chronic cough is resolved. He  had CT scan of the chest that showed stable lung nodules. He has a follow-up CT scan of the chest pending in 1 year. He reports continued sinus drainage even though his cough is resolved. He is not taking anything for it. There are no other new issues.   Dr Lorenza [place] Reflux Symptom Index (> 13-15 suggestive of LPR cough) 0 -> 5  =  none ->severe problem 04/07/2012  06/10/2012 1/3rd better only 08/24/2014   Hoarseness of problem with voice 5 5 1   Clearing  Of Throat 3 5 3   Excess throat mucus or feeling of post nasal drip 3 3 1   Difficulty swallowing food, liquid or tablets 3 0 0  Cough after eating or lying down 3 3 5   Breathing  difficulties or choking episodes 5 0 1  Troublesome or annoying cough 5 2 5   Sensation of something sticking in throat or lump in throat 5 5 4   Heartburn, chest pain, indigestion, or stomach acid coming up 0 0 3  TOTAL 32 23 23  RX intervention Nasal steroid, saline spray and time Rx            Current outpatient prescriptions:  .  amLODipine (NORVASC) 5 MG tablet, Take 1 tablet (5 mg total) by mouth daily. (Patient taking differently: Take 5 mg by mouth as needed. ), Disp: 30 tablet, Rfl: 5 .  apixaban (ELIQUIS) 5 MG TABS tablet, Take 1 tablet (5 mg total) by mouth 2 (two) times daily., Disp: 180 tablet, Rfl: 3 .  diltiazem (CARDIZEM CD) 180 MG 24 hr capsule, Take 1 capsule (180 mg total) by mouth daily., Disp: 90 capsule, Rfl: 3 .  furosemide (LASIX) 20 MG tablet, Take 1 tablet (20 mg total) by mouth daily., Disp: 30 tablet, Rfl: 5 .  HYDROcodone-acetaminophen (NORCO) 10-325 MG per tablet, Take 1 tablet by mouth every 6 (six) hours as needed., Disp: 120 tablet, Rfl: 0 .  Loperamide HCl (IMODIUM PO), Take by mouth as needed.  , Disp: , Rfl:  .  metoprolol succinate (TOPROL-XL) 100 MG 24 hr tablet, TAKE 1 TABLET BY MOUTH 2 TIMES DAILY., Disp: 60 tablet, Rfl: 5 .  predniSONE (DELTASONE) 20 MG tablet, Take 20 mg by mouth as needed., Disp: , Rfl:   Immunization  History  Administered Date(s) Administered  . Influenza Split 11/30/2011  . Influenza Whole 11/10/2009  . Influenza, High Dose Seasonal PF 12/02/2012, 11/07/2013  . Pneumococcal Conjugate-13 05/03/2014  . Td 01/30/2004  . Tdap 05/03/2014      Review of Systems  Constitutional: Negative for fever and unexpected weight change.  HENT: Positive for congestion and rhinorrhea. Negative for dental problem, ear pain, nosebleeds, postnasal drip, sinus pressure, sneezing, sore throat and trouble swallowing.   Eyes: Negative for redness and itching.  Respiratory: Positive for cough. Negative for chest tightness, shortness of breath and wheezing.   Cardiovascular: Negative for palpitations and leg swelling.  Gastrointestinal: Negative for nausea and vomiting.  Genitourinary: Negative for dysuria.  Musculoskeletal: Negative for joint swelling.  Skin: Negative for rash.  Neurological: Negative for headaches.  Hematological: Does not bruise/bleed easily.  Psychiatric/Behavioral: Negative for dysphoric mood. The patient is not nervous/anxious.        Objective:   Physical Exam  Filed Vitals:   10/22/14 1214  BP: 144/82  Pulse: 87  Height: 6' (1.829 m)  Weight: 219 lb (99.338 kg)  SpO2: 98%   Disheveled look Alert and oriented 3 Heart sounds are normal Clear to auscultation bilaterally Soft abdomen No cyanosis no clubbing no edema Postnasal drip present on oral cavity exam      Assessment & Plan:     ICD-9-CM ICD-10-CM   1. Chronic cough 786.2 R05 fluticasone (FLONASE) 50 MCG/ACT nasal spray  2. Sinus drainage 478.19 J34.89 fluticasone (FLONASE) 50 MCG/ACT nasal spray  3. Multiple lung nodules 793.19 R91.8 fluticasone (FLONASE) 50 MCG/ACT nasal spray    Glad cough is resolved For sinus drainage:  Take and START generic fluticasone inhaler 2 squirts each nostril daily For nodules : do CT chest in 1 year   FLu shot 10/22/2014  Followup 1 year   Dr. Brand Males,  M.D., [facility].C.P Pulmonary and Critical Care Medicine Staff Physician [place] [facility] Pager: [phone]  [phone], If no answer or between  15:00h - 7:00h: call [phone]  10/22/2014 12:39 PM

## 2014-10-25 DIAGNOSIS — Z23 Encounter for immunization: Secondary | ICD-10-CM | POA: Diagnosis not present

## 2014-10-28 ENCOUNTER — Telehealth: Payer: Self-pay | Admitting: Physician Assistant

## 2014-10-28 NOTE — Telephone Encounter (Signed)
Pt calling for refill on hydrocodone. He has enough for today. Takes 4/day. Please call [phone] when RX is ready.

## 2014-10-29 ENCOUNTER — Other Ambulatory Visit: Payer: Self-pay

## 2014-10-29 MED ORDER — HYDROCODONE-ACETAMINOPHEN 10-325 MG PO TABS: 1.0000 | ORAL_TABLET | Freq: Four times a day (QID) | ORAL | Status: DC | PRN

## 2014-10-29 NOTE — Telephone Encounter (Signed)
Prescription for Hydrocodone left at front desk for patient. Patient notified.

## 2014-10-29 NOTE — Telephone Encounter (Addendum)
Last fill 09/29/14, Amount: 120,ORF Narcotic Contract signed UDS (low risk), Last OV 09/22/14 Please advise.

## 2014-10-29 NOTE — Telephone Encounter (Signed)
Pt called back in to check on the status of his Rx.  Informed him of the below message.    Please call pt when Rx is ready. He want to come in and pick it up today as soon as possible.   Thanks!    CB#: [phone]

## 2014-10-29 NOTE — Telephone Encounter (Signed)
I assume is hydrocodone, okay #120, no refills. Print a prescription, I will sign

## 2014-10-29 NOTE — Telephone Encounter (Signed)
Relation to HD:IXBO Call back number: [phone]   Reason for call:  Patient checking on the status of medication request. Advised patient PA is out the office until Monday .

## 2014-11-25 ENCOUNTER — Telehealth: Payer: Self-pay | Admitting: Physician Assistant

## 2014-11-25 NOTE — Telephone Encounter (Signed)
Relation to [place]:BRKV Call back number:[phone] Pharmacy:  Reason for call:  Patient requesting a refill predniSONE (DELTASONE) 20 MG tablet and HYDROcodone-acetaminophen (NORCO) 10-325 MG tablet

## 2014-11-26 MED ORDER — PREDNISONE 20 MG PO TABS: 20.0000 mg | ORAL_TABLET | ORAL | Status: DC | PRN

## 2014-11-26 MED ORDER — HYDROCODONE-ACETAMINOPHEN 10-325 MG PO TABS: 1.0000 | ORAL_TABLET | Freq: Four times a day (QID) | ORAL | Status: DC | PRN

## 2014-11-26 NOTE — Telephone Encounter (Signed)
Patient informed, understood & agreed; Prednisone is for Gout per patient, as Colchicine is too expensive, send to [facility]/SLS

## 2014-11-26 NOTE — Telephone Encounter (Signed)
Refill of Norco granted.  At front desk for pickup. The prednisone I have never filled -- what is he requesting a refill of this medication for?

## 2014-11-30 ENCOUNTER — Telehealth: Payer: Self-pay | Admitting: Cardiology

## 2014-11-30 NOTE — Telephone Encounter (Signed)
Patient calling the office for samples of medication:   1.  What medication and dosage are you requesting samples for? Eliquis 5mg   2.  Are you currently out of this medication? Yes   3. Are you requesting samples to get you through until you receive your prescription? No

## 2014-11-30 NOTE — Telephone Encounter (Signed)
Pt advised I will leave Eliquis 5mg  2 boxes of #14 tablets each at front desk to pick up.

## 2014-12-13 ENCOUNTER — Other Ambulatory Visit: Payer: Self-pay | Admitting: Cardiology

## 2014-12-13 MED ORDER — APIXABAN 5 MG PO TABS: 5.0000 mg | ORAL_TABLET | Freq: Two times a day (BID) | ORAL | Status: DC

## 2014-12-13 NOTE — Telephone Encounter (Signed)
Patient calling the office for samples of medication:   1.  What medication and dosage are you requesting samples for?Eliquis  2.  Are you currently out of this medication? Just about out

## 2014-12-13 NOTE — Telephone Encounter (Signed)
Samples for patient placed at front desk.

## 2014-12-27 ENCOUNTER — Telehealth: Payer: Self-pay | Admitting: Physician Assistant

## 2014-12-27 MED ORDER — HYDROCODONE-ACETAMINOPHEN 10-325 MG PO TABS: 1.0000 | ORAL_TABLET | Freq: Four times a day (QID) | ORAL | Status: DC | PRN

## 2014-12-27 NOTE — Telephone Encounter (Signed)
Pt needing refill on hydrocodone. Has 3 days. Taking 6/day. Please call [phone] when RX is ready.

## 2014-12-27 NOTE — Telephone Encounter (Signed)
Refill request for Hydrocodone-APAP Last filled by MD on - 10.28.16, #120x0 Last AEX - 08.24.16 Next AEX - 6-Mths Refill printed per [facility] refill protocol/SLS  Patient needs F/U office visit for Medication Management, as he is taking excess in quantity of his Controlled Substance than both provider prescribed and allowed by Controlled Substance Contract, No further Refills without appointment prior/SLS Thanks.

## 2014-12-28 NOTE — Telephone Encounter (Signed)
LM that RX is ready for pick up and pt needs appt prior to additional refills.

## 2015-01-12 ENCOUNTER — Other Ambulatory Visit: Payer: Self-pay | Admitting: Cardiology

## 2015-01-12 NOTE — Telephone Encounter (Signed)
Patient calling the office for samples of medication:   1.  What medication and dosage are you requesting samples for? Eliquis   2.  Are you currently out of this medication? No,enough until Friday

## 2015-01-14 MED ORDER — APIXABAN 5 MG PO TABS: 5.0000 mg | ORAL_TABLET | Freq: Two times a day (BID) | ORAL | Status: DC

## 2015-01-14 NOTE — Telephone Encounter (Signed)
Called patient to inform him we are currently out of Eliquis he requested that I call enough in until the middle of the week. He will call back the middle of next week to see if samples are available.

## 2015-01-14 NOTE — Telephone Encounter (Signed)
Pt calling again,wants to know if we have samples of Eliquis?

## 2015-01-14 NOTE — Telephone Encounter (Signed)
°  Patient calling the office for samples of medication:   1.  What medication and dosage are you requesting samples for? Eliquis 5 mg  2.  Are you currently out of this medication? Yes, taking his last tablet tonight

## 2015-01-14 NOTE — Telephone Encounter (Signed)
Patient notified no samples available. Enough medication called to get patient through to the middle of next week per his request. He will check back for samples then.

## 2015-01-19 ENCOUNTER — Encounter: Payer: Self-pay | Admitting: Physician Assistant

## 2015-01-19 ENCOUNTER — Telehealth: Payer: Self-pay | Admitting: Cardiology

## 2015-01-19 ENCOUNTER — Other Ambulatory Visit: Payer: Self-pay | Admitting: Physician Assistant

## 2015-01-19 ENCOUNTER — Ambulatory Visit (INDEPENDENT_AMBULATORY_CARE_PROVIDER_SITE_OTHER): Payer: Medicare Other | Admitting: Physician Assistant

## 2015-01-19 VITALS — BP 140/70 | HR 81 | Temp 97.8°F | Ht 72.0 in | Wt 220.2 lb

## 2015-01-19 DIAGNOSIS — I2510 Atherosclerotic heart disease of native coronary artery without angina pectoris: Secondary | ICD-10-CM | POA: Diagnosis not present

## 2015-01-19 DIAGNOSIS — M459 Ankylosing spondylitis of unspecified sites in spine: Secondary | ICD-10-CM

## 2015-01-19 DIAGNOSIS — M10079 Idiopathic gout, unspecified ankle and foot: Secondary | ICD-10-CM

## 2015-01-19 DIAGNOSIS — I10 Essential (primary) hypertension: Secondary | ICD-10-CM | POA: Diagnosis not present

## 2015-01-19 DIAGNOSIS — M109 Gout, unspecified: Secondary | ICD-10-CM

## 2015-01-19 LAB — COMPREHENSIVE METABOLIC PANEL
ALBUMIN: 4.1 g/dL (ref 3.5–5.2)
ALK PHOS: 57 U/L (ref 39–117)
ALT: 12 U/L (ref 0–53)
AST: 19 U/L (ref 0–37)
BILIRUBIN TOTAL: 2.2 mg/dL — AB (ref 0.2–1.2)
BUN: 24 mg/dL — AB (ref 6–23)
CO2: 31 mEq/L (ref 19–32)
CREATININE: 1.36 mg/dL (ref 0.40–1.50)
Calcium: 9.6 mg/dL (ref 8.4–10.5)
Chloride: 101 mEq/L (ref 96–112)
GFR: 54.45 mL/min — ABNORMAL LOW (ref >60.00)
GLUCOSE: 90 mg/dL (ref 70–99)
POTASSIUM: 3.9 meq/L (ref 3.5–5.1)
SODIUM: 141 meq/L (ref 135–145)
TOTAL PROTEIN: 6.9 g/dL (ref 6.0–8.3)

## 2015-01-19 LAB — URIC ACID: URIC ACID, SERUM: 10 mg/dL — AB (ref 4.0–7.8)

## 2015-01-19 MED ORDER — APIXABAN 5 MG PO TABS: 5.0000 mg | ORAL_TABLET | Freq: Two times a day (BID) | ORAL | Status: DC

## 2015-01-19 MED ORDER — HYDROCODONE-ACETAMINOPHEN 10-325 MG PO TABS: 1.0000 | ORAL_TABLET | Freq: Four times a day (QID) | ORAL | Status: DC | PRN

## 2015-01-19 MED ORDER — PREDNISONE 20 MG PO TABS: 20.0000 mg | ORAL_TABLET | ORAL | Status: DC | PRN

## 2015-01-19 NOTE — Assessment & Plan Note (Signed)
Will check LFTs today. Medication refilled. Take as directed.

## 2015-01-19 NOTE — Telephone Encounter (Signed)
no samples to give. advised pt to call [facility] office. pt agreeable to plan.

## 2015-01-19 NOTE — Patient Instructions (Signed)
Please continue medications as directed with the following changes -- Start taking the amlodipine each evening.  Stay hydrated but avoid foods listed below. We are checking a uric acid level. If high we will start a preventive medication.  Gout Gout is when your joints become red, sore, and swell (inflamed). This is caused by the buildup of uric acid crystals in the joints. Uric acid is a chemical that is normally in the blood. If the level of uric acid gets too high in the blood, these crystals form in your joints and tissues. Over time, these crystals can form into masses near the joints and tissues. These masses can destroy bone and cause the bone to look misshapen (deformed). HOME CARE   Do not take aspirin for pain.  Only take medicine as told by your doctor.  Rest the joint as much as you can. When in bed, keep sheets and blankets off painful areas.  Keep the sore joints raised (elevated).  Put warm or cold packs on painful joints. Use of warm or cold packs depends on which works best for you.  Use crutches if the painful joint is in your leg.  Drink enough fluids to keep your pee (urine) clear or pale yellow. Limit alcohol, sugary drinks, and drinks with fructose in them.  Follow your diet instructions. Pay careful attention to how much protein you eat. Include fruits, vegetables, whole grains, and fat-free or low-fat milk products in your daily diet. Talk to your doctor or dietitian about the use of coffee, vitamin C, and cherries. These may help lower uric acid levels.  Keep a healthy body weight. GET HELP RIGHT AWAY IF:   You have watery poop (diarrhea), throw up (vomit), or have any side effects from medicines.  You do not feel better in 24 hours, or you are getting worse.  Your joint becomes suddenly more tender, and you have chills or a fever. MAKE SURE YOU:   Understand these instructions.  Will watch your condition.  Will get help right away if you are not doing  well or get worse.   This information is not intended to replace advice given to you by your health care provider. Make sure you discuss any questions you have with your health care provider.   Document Released: 10/25/2007 Document Revised: 02/05/2014 Document Reviewed: 08/29/2011 Elsevier Interactive Patient Education [employer].

## 2015-01-19 NOTE — Progress Notes (Signed)
Pre visit review using our clinic review tool, if applicable. No additional management support is needed unless otherwise documented below in the visit note. 

## 2015-01-19 NOTE — Telephone Encounter (Signed)
Patient calling the office for samples of medication:   1.  What medication and dosage are you requesting samples for? Eliquis 5 mg   2.  Are you currently out of this medication? Yes   Pt has been informed that we have some and they will be placed up front for him.

## 2015-01-19 NOTE — Assessment & Plan Note (Signed)
Will check uric acid level. If elevated will begin preventive measures.

## 2015-01-19 NOTE — Assessment & Plan Note (Signed)
Giving reported history will switch amlodipine to evening.  Continue other medications as directed. DAsh diet encouraged. Follow-up with Cardiology as scheduled.

## 2015-01-19 NOTE — Telephone Encounter (Signed)
Patient calling the office for samples of medication: ° ° °1.  What medication and dosage are you requesting samples for? °Eliquis 5 mg  °2.  Are you currently out of this medication?  °Yes ° ° ° °

## 2015-01-19 NOTE — Telephone Encounter (Signed)
Samples available for pickup  

## 2015-01-19 NOTE — Progress Notes (Signed)
Patient with history of paroxysmal a. fib presents to clinic today for follow-up of hypertension. Endorses taking medications as directed Patient denies chest pain, palpitations, lightheadedness, dizziness, vision changes or frequent headaches. Does note BP elevated in early morning around 3-4 AM at 160/100.   BP Readings from Last 3 Encounters:  01/19/15 140/70  10/22/14 144/82  09/23/14 114/50   Patient also with history of ankylosing spondylitis. Is currently on Hydrocodone every 6 hours with good relief of symptoms.  Is having gout flares recently about once every few weeks. Is taking prednisone when this occurs. Is not on any preventive medications. Is not watching diet.  Past Medical History  Diagnosis Date  . Ventricular hypertrophy   . Persistent atrial fibrillation ([place])   . Hiatal hernia   . 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 Cholelithiasis   . Sleep apnea   . Nephrolithiasis   . Arthritis   . Hypertension   . Ankylosing spondylitis ([place])   . GERD (gastroesophageal reflux disease)   . CAD (coronary artery disease) [id number]    30% mid LAD lesion on cardiac cath  . Chicken pox   . [place] measles     Current Outpatient Prescriptions on File Prior to Visit  Medication Sig Dispense Refill  . amLODipine (NORVASC) 5 MG tablet Take 1 tablet (5 mg total) by mouth daily. (Patient taking differently: Take 5 mg by mouth as needed. ) 30 tablet 5  . apixaban (ELIQUIS) 5 MG TABS tablet Take 1 tablet (5 mg total) by mouth 2 (two) times daily. 10 tablet 0  . diltiazem (CARDIZEM CD) 180 MG 24 hr capsule Take 1 capsule (180 mg total) by mouth daily. 90 capsule 3  . fluticasone (FLONASE) 50 MCG/ACT nasal spray Place 2 sprays into both nostrils daily. 16 g 11  . furosemide (LASIX) 20 MG tablet Take 1 tablet (20 mg total) by mouth daily. 30 tablet 5  . Loperamide HCl (IMODIUM PO) Take by mouth as needed.      . metoprolol succinate (TOPROL-XL) 100 MG 24 hr tablet TAKE 1 TABLET BY MOUTH  2 TIMES DAILY. 60 tablet 5  . predniSONE (DELTASONE) 20 MG tablet Take 1 tablet (20 mg total) by mouth as needed. 10 tablet 0   No current facility-administered medications on file prior to visit.    Allergies  Allergen Reactions  . Coumadin [Warfarin]     GI bleeds    Family History  Problem Relation Age of Onset  . Colon cancer Neg Hx   . Heart disease Father 50    Deceased  . Heart attack Father 44  . Heart disease Brother 55    "Died in his sleep"  . Arthritis/Rheumatoid Mother     Deceased-86  . Dementia Paternal Grandmother   . Cancer Paternal Uncle   . Hyperlipidemia Brother   . Stroke Brother 30    Deceased  . Other Sister     Estate agent in [place]  . Other Sister     MVA  . Other Brother     Carjacked-killed  . Healthy Brother   . Healthy Son     X3  . Healthy Daughter     x1    Social History   Social History  . Marital Status: Married    Spouse Name: N/A  . Number of Children: 4  . Years of Education: N/A   Occupational History  . Retired     Estate manager/land agent   Social History  Main Topics  . Smoking status: Never Smoker   . Smokeless tobacco: Never Used  . Alcohol Use: No  . Drug Use: No  . Sexual Activity: Not Asked   Other Topics Concern  . None   Social History Narrative   4 caffeine drinks daily    Review of Systems - See HPI.  All other ROS are negative.  BP 140/70 mmHg  Pulse 81  Temp(Src) 97.8 F (36.6 C) (Oral)  Ht 6' (1.829 m)  Wt 220 lb 3.2 oz (99.882 kg)  BMI 29.86 kg/m2  SpO2 97%  Physical Exam  Constitutional: He is oriented to person, place, and time and well-developed, well-nourished, and in no distress.  HENT:  Head: Normocephalic and atraumatic.  Eyes: Conjunctivae are normal.  Cardiovascular: Normal rate, regular rhythm, normal heart sounds and intact distal pulses.   Pulmonary/Chest: Effort normal and breath sounds normal. No respiratory distress. He has no wheezes. He has no rales. He exhibits no tenderness.    Neurological: He is alert and oriented to person, place, and time.  Skin: Skin is warm and dry. No rash noted.  Psychiatric: Affect normal.  Vitals reviewed.  No results found for this or any previous visit (from the past 2160 hour(s)).  Assessment/Plan: Ankylosing spondylitis Will check LFTs today. Medication refilled. Take as directed.  Essential hypertension Giving reported history will switch amlodipine to evening.  Continue other medications as directed. DAsh diet encouraged. Follow-up with Cardiology as scheduled.  Gout Will check uric acid level. If elevated will begin preventive measures.

## 2015-01-21 ENCOUNTER — Telehealth: Payer: Self-pay | Admitting: *Deleted

## 2015-01-21 DIAGNOSIS — R17 Unspecified jaundice: Secondary | ICD-10-CM

## 2015-01-21 MED ORDER — ALLOPURINOL 300 MG PO TABS: 300.0000 mg | ORAL_TABLET | Freq: Every day | ORAL | Status: DC

## 2015-01-21 NOTE — Telephone Encounter (Signed)
-----   Message from Brunetta Jeans, PA-C sent at 01/21/2015 11:34 AM EST ----- Uric acid level elevated. Since he is asymptomatic presently, recommend we restart Allopurinol 300 mg to prevent gout attacks. Also his bilirubin remains elevated -- has he seen GI before for this? IF not, would like to set him up with GI.

## 2015-01-21 NOTE — Telephone Encounter (Signed)
Called and spoke with the pt and his wife and informed them of the pt's recent lab results and note.  Both the pt and the wife verbalized understanding.  The pt agreed to restart the Allopurinol 300 mg.   New prescription sent to the pharmacy by e-script.  Pt states that he has seen a GI doctor before with ([facility]) but have not been back to see them.  Pt agreed to go back to see the GI doctor.//AB/CMA

## 2015-01-21 NOTE — Telephone Encounter (Signed)
Referral placed.

## 2015-01-25 ENCOUNTER — Encounter: Payer: Self-pay | Admitting: Gastroenterology

## 2015-02-03 ENCOUNTER — Telehealth: Payer: Self-pay | Admitting: Cardiology

## 2015-02-03 NOTE — Telephone Encounter (Signed)
Patient calling the office for samples of medication:   1.  What medication and dosage are you requesting samples for? Eliquis  2.  Are you currently out of this medication? No, 2 left

## 2015-02-03 NOTE — Telephone Encounter (Signed)
Pt informed samples available at front desk.

## 2015-02-15 ENCOUNTER — Encounter: Payer: Self-pay | Admitting: Gastroenterology

## 2015-02-15 ENCOUNTER — Telehealth: Payer: Self-pay | Admitting: Emergency Medicine

## 2015-02-15 ENCOUNTER — Other Ambulatory Visit (INDEPENDENT_AMBULATORY_CARE_PROVIDER_SITE_OTHER): Payer: Self-pay

## 2015-02-15 ENCOUNTER — Ambulatory Visit (INDEPENDENT_AMBULATORY_CARE_PROVIDER_SITE_OTHER): Payer: Self-pay | Admitting: Gastroenterology

## 2015-02-15 VITALS — BP 140/80 | HR 64 | Ht 72.0 in | Wt 222.0 lb

## 2015-02-15 DIAGNOSIS — K219 Gastro-esophageal reflux disease without esophagitis: Secondary | ICD-10-CM

## 2015-02-15 DIAGNOSIS — I480 Paroxysmal atrial fibrillation: Secondary | ICD-10-CM

## 2015-02-15 DIAGNOSIS — R17 Unspecified jaundice: Secondary | ICD-10-CM

## 2015-02-15 DIAGNOSIS — R109 Unspecified abdominal pain: Secondary | ICD-10-CM

## 2015-02-15 DIAGNOSIS — Z7901 Long term (current) use of anticoagulants: Secondary | ICD-10-CM

## 2015-02-15 DIAGNOSIS — Z86010 Personal history of colonic polyps: Secondary | ICD-10-CM

## 2015-02-15 LAB — HEPATIC FUNCTION PANEL
ALT: 12 U/L (ref 0–53)
AST: 15 U/L (ref 0–37)
Albumin: 3.8 g/dL (ref 3.5–5.2)
Alkaline Phosphatase: 53 U/L (ref 39–117)
BILIRUBIN TOTAL: 2 mg/dL — AB (ref 0.2–1.2)
Bilirubin, Direct: 0.3 mg/dL (ref 0.0–0.3)
Total Protein: 6.3 g/dL (ref 6.0–8.3)

## 2015-02-15 MED ORDER — NA SULFATE-K SULFATE-MG SULF 17.5-3.13-1.6 GM/177ML PO SOLN: 1.0000 | ORAL | Status: AC

## 2015-02-15 MED ORDER — OMEPRAZOLE 40 MG PO CPDR: 40.0000 mg | DELAYED_RELEASE_CAPSULE | Freq: Every day | ORAL | Status: DC

## 2015-02-15 MED FILL — OMEPRAZOLE DR 40 MG CAPSULE: 40 | 30 days supply | Qty: 30 | Fill #0

## 2015-02-15 MED FILL — FUROSEMIDE 20 MG TABLET: 20 | 30 days supply | Qty: 30 | Fill #3 | Status: TO

## 2015-02-15 NOTE — Telephone Encounter (Signed)
02/15/2015   RE: [name] Woods DOB: 10/10/1940 MRN: [id number]   Dear Dr.Hochrein,    We have scheduled the above patient for an endoscopic procedure. Our records show that he is on anticoagulation therapy.   Please advise as to how long the patient may come off his therapy of Eliquis prior to the procedure, which is scheduled for 04/01/15.  Please fax back/ or route the completed form to [facility]   Sincerely,    Tinnie Gens, [place]

## 2015-02-15 NOTE — Patient Instructions (Signed)
You have been scheduled for an endoscopy and colonoscopy. Please follow the written instructions given to you at your visit today. Please pick up your prep supplies at the pharmacy within the next 1-3 days. If you use inhalers (even only as needed), please bring them with you on the day of your procedure. Your physician has requested that you go to www.startemmi.com and enter the access code given to you at your visit today. This web site gives a general overview about your procedure. However, you should still follow specific instructions given to you by our office regarding your preparation for the procedure.  Your physician has requested that you go to the basement for tab work before leaving today.  We have sent the following medications to your pharmacy for you to pick up at your convenience: Omeprazole 40 mg daily

## 2015-02-16 NOTE — Telephone Encounter (Signed)
The patient can stop Eliquis for two days prior to the procedure and restart per the performing MD.

## 2015-02-17 NOTE — Telephone Encounter (Signed)
Patient informed to stop Eliquis 2 days before procedure. Pt verbalized understanding.

## 2015-02-22 ENCOUNTER — Encounter: Payer: Self-pay | Admitting: Physician Assistant

## 2015-02-22 ENCOUNTER — Ambulatory Visit (INDEPENDENT_AMBULATORY_CARE_PROVIDER_SITE_OTHER): Payer: Self-pay | Admitting: Physician Assistant

## 2015-02-22 VITALS — BP 122/60 | HR 80 | Temp 97.7°F | Wt 222.0 lb

## 2015-02-22 DIAGNOSIS — J019 Acute sinusitis, unspecified: Secondary | ICD-10-CM | POA: Insufficient documentation

## 2015-02-22 DIAGNOSIS — B9689 Other specified bacterial agents as the cause of diseases classified elsewhere: Secondary | ICD-10-CM | POA: Insufficient documentation

## 2015-02-22 MED ORDER — HYDROCODONE-ACETAMINOPHEN 10-325 MG PO TABS: 1.0000 | ORAL_TABLET | Freq: Four times a day (QID) | ORAL | Status: DC | PRN

## 2015-02-22 MED ORDER — HYDROCODONE-HOMATROPINE 5-1.5 MG/5ML PO SYRP: 5.0000 mL | ORAL_SOLUTION | Freq: Three times a day (TID) | ORAL | Status: DC | PRN

## 2015-02-22 MED ORDER — AMOXICILLIN-POT CLAVULANATE 875-125 MG PO TABS: 1.0000 | ORAL_TABLET | Freq: Two times a day (BID) | ORAL | Status: DC

## 2015-02-22 MED FILL — AMOX-CLAV 875-125 MG TABLET: 875-125 | 7 days supply | Qty: 14 | Fill #0

## 2015-02-22 MED FILL — ALLOPURINOL 300 MG TABLET: 300 | 30 days supply | Qty: 30 | Fill #0 | Status: TO

## 2015-02-22 NOTE — Patient Instructions (Signed)
Please take antibiotic as directed.  Increase fluid intake.  Use Saline nasal spray.  Take a daily multivitamin. Use Hycodan cough syrup as directed.  Place a humidifier in the bedroom.  Please call or return clinic if symptoms are not improving.  Sinusitis Sinusitis is redness, soreness, and swelling (inflammation) of the paranasal sinuses. Paranasal sinuses are air pockets within the bones of your face (beneath the eyes, the middle of the forehead, or above the eyes). In healthy paranasal sinuses, mucus is able to drain out, and air is able to circulate through them by way of your nose. However, when your paranasal sinuses are inflamed, mucus and air can become trapped. This can allow bacteria and other germs to grow and cause infection. Sinusitis can develop quickly and last only a short time (acute) or continue over a long period (chronic). Sinusitis that lasts for more than 12 weeks is considered chronic.  CAUSES  Causes of sinusitis include:  Allergies.  Structural abnormalities, such as displacement of the cartilage that separates your nostrils (deviated septum), which can decrease the air flow through your nose and sinuses and affect sinus drainage.  Functional abnormalities, such as when the small hairs (cilia) that line your sinuses and help remove mucus do not work properly or are not present. SYMPTOMS  Symptoms of acute and chronic sinusitis are the same. The primary symptoms are pain and pressure around the affected sinuses. Other symptoms include:  Upper toothache.  Earache.  Headache.  Bad breath.  Decreased sense of smell and taste.  A cough, which worsens when you are lying flat.  Fatigue.  Fever.  Thick drainage from your nose, which often is green and may contain pus (purulent).  Swelling and warmth over the affected sinuses. DIAGNOSIS  Your caregiver will perform a physical exam. During the exam, your caregiver may:  Look in your nose for signs of abnormal  growths in your nostrils (nasal polyps).  Tap over the affected sinus to check for signs of infection.  View the inside of your sinuses (endoscopy) with a special imaging device with a light attached (endoscope), which is inserted into your sinuses. If your caregiver suspects that you have chronic sinusitis, one or more of the following tests may be recommended:  Allergy tests.  Nasal culture A sample of mucus is taken from your nose and sent to a lab and screened for bacteria.  Nasal cytology A sample of mucus is taken from your nose and examined by your caregiver to determine if your sinusitis is related to an allergy. TREATMENT  Most cases of acute sinusitis are related to a viral infection and will resolve on their own within 10 days. Sometimes medicines are prescribed to help relieve symptoms (pain medicine, decongestants, nasal steroid sprays, or saline sprays).  However, for sinusitis related to a bacterial infection, your caregiver will prescribe antibiotic medicines. These are medicines that will help kill the bacteria causing the infection.  Rarely, sinusitis is caused by a fungal infection. In theses cases, your caregiver will prescribe antifungal medicine. For some cases of chronic sinusitis, surgery is needed. Generally, these are cases in which sinusitis recurs more than 3 times per year, despite other treatments. HOME CARE INSTRUCTIONS   Drink plenty of water. Water helps thin the mucus so your sinuses can drain more easily.  Use a humidifier.  Inhale steam 3 to 4 times a day (for example, sit in the bathroom with the shower running).  Apply a warm, moist washcloth to your face  3 to 4 times a day, or as directed by your caregiver.  Use saline nasal sprays to help moisten and clean your sinuses.  Take over-the-counter or prescription medicines for pain, discomfort, or fever only as directed by your caregiver. SEEK IMMEDIATE MEDICAL CARE IF:  You have increasing pain or  severe headaches.  You have nausea, vomiting, or drowsiness.  You have swelling around your face.  You have vision problems.  You have a stiff neck.  You have difficulty breathing. MAKE SURE YOU:   Understand these instructions.  Will watch your condition.  Will get help right away if you are not doing well or get worse. Document Released: 01/15/2005 Document Revised: 04/09/2011 Document Reviewed: 01/30/2011 ExitCare Patient Information 2014 [employer].   

## 2015-02-22 NOTE — Progress Notes (Signed)
Patient presents to clinic today c/o 5 days of progressively worsening sinus pressure, sinus pain, cough productive of green sputum. Denies fever, chills, chest pain or SOB. Denies recent travel or sick contact. Has taken Mucinex daily.  Past Medical History  Diagnosis Date  . Ventricular hypertrophy   . Persistent atrial fibrillation ([place])   . Hiatal hernia   . 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 Cholelithiasis   . Sleep apnea   . Nephrolithiasis   . Arthritis   . Hypertension   . Ankylosing spondylitis ([place])   . GERD (gastroesophageal reflux disease)   . CAD (coronary artery disease) 6701,4103    30% mid LAD lesion on cardiac cath  . Chicken pox   . [place] measles     Current Outpatient Prescriptions on File Prior to Visit  Medication Sig Dispense Refill  . allopurinol (ZYLOPRIM) 300 MG tablet Take 1 tablet (300 mg total) by mouth daily. 30 tablet 2  . amLODipine (NORVASC) 5 MG tablet Take 1 tablet (5 mg total) by mouth daily. (Patient taking differently: Take 5 mg by mouth as needed. ) 30 tablet 5  . apixaban (ELIQUIS) 5 MG TABS tablet Take 1 tablet (5 mg total) by mouth 2 (two) times daily. 28 tablet 0  . diltiazem (CARDIZEM CD) 180 MG 24 hr capsule Take 1 capsule (180 mg total) by mouth daily. 90 capsule 3  . fluticasone (FLONASE) 50 MCG/ACT nasal spray Place 2 sprays into both nostrils daily. 16 g 11  . furosemide (LASIX) 20 MG tablet Take 1 tablet (20 mg total) by mouth daily. 30 tablet 5  . metoprolol succinate (TOPROL-XL) 100 MG 24 hr tablet TAKE 1 TABLET BY MOUTH 2 TIMES DAILY. 60 tablet 5  . Na Sulfate-K Sulfate-Mg Sulf SOLN Take 1 kit by mouth as directed. 354 mL 0  . omeprazole (PRILOSEC) 40 MG capsule Take 1 capsule (40 mg total) by mouth daily. 30 capsule 5  . predniSONE (DELTASONE) 20 MG tablet Take 1 tablet (20 mg total) by mouth as needed. 10 tablet 0  . Loperamide HCl (IMODIUM PO) Take by mouth as needed. Reported on 02/22/2015     No current  facility-administered medications on file prior to visit.    Allergies  Allergen Reactions  . Coumadin [Warfarin]     GI bleeds    Family History  Problem Relation Age of Onset  . Colon cancer Neg Hx   . Heart disease Father 65    Deceased  . Heart attack Father 66  . Heart disease Brother 63    "Died in his sleep"  . Arthritis/Rheumatoid Mother     Deceased-86  . Dementia Paternal Grandmother   . Cancer Paternal Uncle   . Hyperlipidemia Brother   . Stroke Brother 57    Deceased  . Other Sister     Estate agent in [place]  . Other Sister     MVA  . Other Brother     Carjacked-killed  . Healthy Brother   . Healthy Son     X3  . Healthy Daughter     x1    Social History   Social History  . Marital Status: Married    Spouse Name: N/A  . Number of Children: 6  . Years of Education: N/A   Occupational History  . Retired     Estate manager/land agent   Social History Main Topics  . Smoking status: Never Smoker   . Smokeless tobacco: Never Used  .  Alcohol Use: No  . Drug Use: No  . Sexual Activity: Not on file   Other Topics Concern  . Not on file   Social History Narrative   4 caffeine drinks daily    Review of Systems - See HPI.  All other ROS are negative.  BP 122/60 mmHg  Pulse 80  Temp(Src) 97.7 F (36.5 C) (Oral)  Wt 222 lb (100.699 kg)  SpO2 96%  Physical Exam  Constitutional: He is oriented to person, place, and time and well-developed, well-nourished, and in no distress.  HENT:  Head: Normocephalic and atraumatic.  Right Ear: Tympanic membrane normal.  Left Ear: Tympanic membrane normal.  Nose: Right sinus exhibits maxillary sinus tenderness. Left sinus exhibits maxillary sinus tenderness.  Mouth/Throat: Uvula is midline, oropharynx is clear and moist and mucous membranes are normal.  Eyes: Conjunctivae are normal.  Cardiovascular: Normal rate, regular rhythm, normal heart sounds and intact distal pulses.   Pulmonary/Chest: Effort normal and breath sounds  normal. No respiratory distress. He has no wheezes. He has no rales. He exhibits no tenderness.  Neurological: He is alert and oriented to person, place, and time.  Skin: Skin is warm and dry. No rash noted.  Psychiatric: Affect normal.  Vitals reviewed.   Recent Results (from the past 2160 hour(s))  Comp Met (CMET)     Status: Abnormal   Collection Time: 01/19/15 11:00 AM  Result Value Ref Range   Sodium 141 135 - 145 mEq/L   Potassium 3.9 3.5 - 5.1 mEq/L   Chloride 101 96 - 112 mEq/L   CO2 31 19 - 32 mEq/L   Glucose, Bld 90 70 - 99 mg/dL   BUN 24 (H) 6 - 23 mg/dL   Creatinine, Ser 1.36 0.40 - 1.50 mg/dL   Total Bilirubin 2.2 (H) 0.2 - 1.2 mg/dL   Alkaline Phosphatase 57 39 - 117 U/L   AST 19 0 - 37 U/L   ALT 12 0 - 53 U/L   Total Protein 6.9 6.0 - 8.3 g/dL   Albumin 4.1 3.5 - 5.2 g/dL   Calcium 9.6 8.4 - 10.5 mg/dL   GFR 54.45 (L) >60.00 mL/min  Uric acid     Status: Abnormal   Collection Time: 01/19/15 11:00 AM  Result Value Ref Range   Uric Acid, Serum 10.0 (H) 4.0 - 7.8 mg/dL  Hepatic function panel     Status: Abnormal   Collection Time: 02/15/15 11:55 AM  Result Value Ref Range   Total Bilirubin 2.0 (H) 0.2 - 1.2 mg/dL   Bilirubin, Direct 0.3 0.0 - 0.3 mg/dL   Alkaline Phosphatase 53 39 - 117 U/L   AST 15 0 - 37 U/L   ALT 12 0 - 53 U/L   Total Protein 6.3 6.0 - 8.3 g/dL   Albumin 3.8 3.5 - 5.2 g/dL    Assessment/Plan: Acute bacterial sinusitis Rx Augmentin.  Increase fluids.  Rest.  Saline nasal spray.  Probiotic.  Mucinex as directed.  Humidifier in bedroom. Hycodan per orders.  Call or return to clinic if symptoms are not improving.

## 2015-02-22 NOTE — Assessment & Plan Note (Signed)
Rx Augmentin.  Increase fluids.  Rest.  Saline nasal spray.  Probiotic.  Mucinex as directed.  Humidifier in bedroom. Hycodan per orders.  Call or return to clinic if symptoms are not improving.

## 2015-02-24 MED FILL — HYDROCODON-APAP 10-325: 10-325 | 30 days supply | Qty: 120 | Fill #0

## 2015-03-01 ENCOUNTER — Telehealth: Payer: Self-pay | Admitting: Cardiology

## 2015-03-01 NOTE — Telephone Encounter (Signed)
New message ° ° ° ° ° ° °Patient calling the office for samples of medication: ° ° °1.  What medication and dosage are you requesting samples for? eliquis 5mg ° °2.  Are you currently out of this medication? Almost out ° ° °

## 2015-03-02 ENCOUNTER — Encounter: Payer: Self-pay | Admitting: Gastroenterology

## 2015-03-02 DIAGNOSIS — Z86010 Personal history of colonic polyps: Secondary | ICD-10-CM | POA: Insufficient documentation

## 2015-03-02 DIAGNOSIS — R109 Unspecified abdominal pain: Secondary | ICD-10-CM | POA: Insufficient documentation

## 2015-03-02 DIAGNOSIS — R17 Unspecified jaundice: Secondary | ICD-10-CM | POA: Insufficient documentation

## 2015-03-02 DIAGNOSIS — Z7901 Long term (current) use of anticoagulants: Secondary | ICD-10-CM | POA: Insufficient documentation

## 2015-03-02 NOTE — Progress Notes (Signed)
02/15/2015 [name] Woods [id number] Jul 31, 1940   HISTORY OF PRESENT ILLNESS:  This is a pleasant 75 year old male who is previously known to Dr. Fuller Plan, last seen 2012.  Colonoscopy 10/2006 showed colon polyps that were adenomatous and fragments of hyperplastic polyps.  He is overdue for surveillance colonoscopy.  Is referred here now by [name], PA-C for elevated total bili.  This has been elevated dating back 2 years and direct bili is normal.  All other LFT's normal.  Total bili has not exceeded 2.4.  MR abdomen in May, 2016 showed stable tiny hepatic cysts.    Complains of ongoing reflux.  Was previously on omeprazole 20 mg daily.  Dr. Fuller Plan was considering EGD at patient's last visit in 2012.  Having LUQ abdominal pain that he describes as sharp and worsening over the past year.  MRI abdomen with and without contrast in May 2008 showed only stable hepatic cysts (stated above) and multiple small benign appearing B/L renal cysts.  No weight loss.  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  He has atrial fibrillation and is on Eliquis for this, followed by Dr. Percival Spanish.   Past Medical History  Diagnosis Date  . Ventricular hypertrophy   . Persistent atrial fibrillation ([place])   . Hiatal hernia   . 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 Cholelithiasis   . Sleep apnea   . Nephrolithiasis   . Arthritis   . Hypertension   . Ankylosing spondylitis ([place])   . GERD (gastroesophageal reflux disease)   . CAD (coronary artery disease) 5456,2563    30% mid LAD lesion on cardiac cath  . Chicken pox   . [place] measles    Past Surgical History  Procedure Laterality Date  . Cholecystectomy    . Cardiac catheterization  03/2012    30% mid LAD lesion otherwise normal cors, LVEF 65%  . Tonsillectomy    . Left and right heart catheterization with coronary angiogram N/A 03/24/2012    Procedure: LEFT AND RIGHT  HEART CATHETERIZATION WITH CORONARY ANGIOGRAM;  Surgeon: Sherren Mocha, MD;  Location: [facility] CATH LAB;  Service: Cardiovascular;  Laterality: N/A;  . Elbow arthroplasty      Left  . Leg fluid removal right      reports that he has never smoked. He has never used smokeless tobacco. He reports that he does not drink alcohol or use illicit drugs. family history includes Arthritis/Rheumatoid in his mother; Cancer in his paternal uncle; Dementia in his paternal grandmother; Healthy in his brother, daughter, and son; Heart attack (age of onset: 25) in his father; Heart disease (age of onset: 80) in his father; Heart disease (age of onset: 60) in his brother; Hyperlipidemia in his brother; Other in his brother, sister, and sister; Stroke (age of onset: 59) in his brother. There is no history of Colon cancer. Allergies  Allergen Reactions  . Coumadin [Warfarin]     GI bleeds      Outpatient Encounter Prescriptions as of 02/15/2015  Medication Sig  . allopurinol (ZYLOPRIM) 300 MG tablet Take 1 tablet (300 mg total) by mouth daily.  Marland Kitchen amLODipine (NORVASC) 5 MG tablet Take 1 tablet (5 mg total) by mouth daily. (Patient taking differently: Take 5 mg by mouth as needed. )  . apixaban (ELIQUIS) 5 MG TABS tablet Take 1 tablet (5 mg total) by mouth 2 (two) times daily.  Marland Kitchen diltiazem (CARDIZEM CD) 180  MG 24 hr capsule Take 1 capsule (180 mg total) by mouth daily.  . fluticasone (FLONASE) 50 MCG/ACT nasal spray Place 2 sprays into both nostrils daily.  . furosemide (LASIX) 20 MG tablet Take 1 tablet (20 mg total) by mouth daily.  . Loperamide HCl (IMODIUM PO) Take by mouth as needed. Reported on 02/22/2015  . metoprolol succinate (TOPROL-XL) 100 MG 24 hr tablet TAKE 1 TABLET BY MOUTH 2 TIMES DAILY.  Marland Kitchen predniSONE (DELTASONE) 20 MG tablet Take 1 tablet (20 mg total) by mouth as needed.  . [DISCONTINUED] HYDROcodone-acetaminophen (NORCO) 10-325 MG tablet Take 1 tablet by mouth every 6 (six) hours as needed.  . Na  Sulfate-K Sulfate-Mg Sulf SOLN Take 1 kit by mouth as directed.  Marland Kitchen omeprazole (PRILOSEC) 40 MG capsule Take 1 capsule (40 mg total) by mouth daily.   No facility-administered encounter medications on file as of 02/15/2015.     REVIEW OF SYSTEMS  : All other systems reviewed and negative except where noted in the History of Present Illness.   PHYSICAL EXAM: BP 140/80 mmHg  Pulse 64  Ht 6' (1.829 m)  Wt 222 lb (100.699 kg)  BMI 30.10 kg/m2 General: Well developed white male in no acute distress Head: Normocephalic and atraumatic Eyes:  Sclerae anicteric, conjunctiva pink. Ears: Normal auditory acuity Lungs: Clear throughout to auscultation Heart: Regular rate and rhythm Abdomen: Soft, non-distended.  Normal bowel sounds.  Minimal LUQ TTP. Rectal:  Will be done at the time of colonoscopy. Musculoskeletal: Symmetrical with no gross deformities  Skin: No lesions on visible extremities Extremities: No edema  Neurological: Alert oriented x 4, grossly non-focal Psychological:  Alert and cooperative. 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  This has been elevated dating back 2 years and direct bili is normal.  All other LFT's normal.  Total bili has not exceeded 2.4.  MR abdomen in May, 2016 showed stable tiny hepatic cysts.  Will recheck hepatic function panel today, but likely does not need further evaluation. -GERD and LUQ abdominal pain:  ? If pain is gastritis vs ulcer disease.  Will schedule for EGD with Dr. Fuller Plan.  Will place him on omeprazole 40 mg daily.  MRI abdomen ok in May 2016. -Personal history of colon polyps:  Adenomatous polyps in 10/2006.  Will schedule for colonoscopy with Dr. Fuller Plan. -Chronic anticoagulation with Eliquis for atrial fibrillation:  Will hold Eliquis for 2 days prior to endoscopic procedures - will instruct when and how to resume after procedure. Benefits and  risks of procedure explained including risks of bleeding, perforation, infection, missed lesions, reactions to medications and possible need for hospitalization and surgery for complications. Additional rare but real risk of stroke or other vascular clotting events off of Eliquis also explained and need to seek urgent help if any signs of these problems occur. Will communicate by phone or EMR with patient's prescribing provider, Dr. Percival Spanish, to confirm that holding Eliquis is reasonable in this case.   *The risks, benefits, and alternatives to EGD and colonoscopy were discussed with the patient and he consents to proceed.    CC:  Brunetta Jeans, PA-C

## 2015-03-02 NOTE — Progress Notes (Signed)
Reviewed and agree with management plan.  Krisy Dix T. [place], MD FACG 

## 2015-03-02 NOTE — Telephone Encounter (Signed)
Follow up ° ° ° ° ° °Patient calling the office for samples of medication: ° ° °1.  What medication and dosage are you requesting samples for?  eliquis 5mg ° °2.  Are you currently out of this medication?  Almost out ° ° °

## 2015-03-03 NOTE — Telephone Encounter (Signed)
Follow up ° ° ° ° ° °Patient calling the office for samples of medication: ° ° °1.  What medication and dosage are you requesting samples for?  eliquis 5mg ° °2.  Are you currently out of this medication?  Almost out ° ° °

## 2015-03-04 ENCOUNTER — Telehealth: Payer: Self-pay | Admitting: Cardiology

## 2015-03-04 MED ORDER — APIXABAN 5 MG PO TABS: 5.0000 mg | ORAL_TABLET | Freq: Two times a day (BID) | ORAL | Status: DC

## 2015-03-04 NOTE — Telephone Encounter (Signed)
Pt has 1 phone number on file - dialed - message states recipient unable to take calls at this time.  Operator: I will set out samples of Eliquis at [facility] office. If pt calls back please make him aware.

## 2015-03-04 NOTE — Telephone Encounter (Signed)
New message  ° ° ° °Patient calling the office for samples of medication: ° ° °1.  What medication and dosage are you requesting samples for? eliquis 5 mg  ° °2.  Are you currently out of this medication? Yes  ° °

## 2015-03-04 NOTE — Telephone Encounter (Signed)
See previous open encounter - pt should have been informed samples available. I returned call. Did manage to connect this time.  Phone rang for extended period before going to VM. Left message informing that samples were available.

## 2015-03-17 MED FILL — METOPROLOL SUCC ER 100 MG T: 100 | 30 days supply | Qty: 60 | Fill #5

## 2015-03-22 ENCOUNTER — Telehealth: Payer: Self-pay | Admitting: Physician Assistant

## 2015-03-22 DIAGNOSIS — R17 Unspecified jaundice: Secondary | ICD-10-CM

## 2015-03-22 NOTE — Telephone Encounter (Signed)
Pt called and states he is having endoscopy and colonoscopy done at [facility] 04/01/15. He states [facility] sent him there due to blood work and issues with liver. Pt states [facility] told him he needs referral and auth from insurance. Please f/u as needed.

## 2015-03-22 NOTE — Telephone Encounter (Signed)
Referral placed.

## 2015-03-28 ENCOUNTER — Other Ambulatory Visit: Payer: Self-pay | Admitting: Physician Assistant

## 2015-03-28 ENCOUNTER — Telehealth: Payer: Self-pay | Admitting: Cardiology

## 2015-03-28 ENCOUNTER — Telehealth: Payer: Self-pay | Admitting: Physician Assistant

## 2015-03-28 MED ORDER — HYDROCODONE-ACETAMINOPHEN 10-325 MG PO TABS: 1.0000 | ORAL_TABLET | Freq: Four times a day (QID) | ORAL | Status: DC | PRN

## 2015-03-28 NOTE — Telephone Encounter (Signed)
Patient calling the office for samples of medication: ° ° °1.  What medication and dosage are you requesting samples for? °Eliquis 5 mg  °2.  Are you currently out of this medication?  °Yes ° ° ° °

## 2015-03-28 NOTE — Telephone Encounter (Signed)
Medication refill granted. I have signed and placed at front desk for pick up.

## 2015-03-28 NOTE — Telephone Encounter (Signed)
Relationship to patient: Self   Can be reached: [phone]    Reason for call: Pt is requesting a refill request on his HYDROcodone

## 2015-03-29 MED ORDER — APIXABAN 5 MG PO TABS: 5.0000 mg | ORAL_TABLET | Freq: Two times a day (BID) | ORAL | Status: DC

## 2015-03-29 MED FILL — HYDROCODON-APAP 10-325: 10-325 | 30 days supply | Qty: 120 | Fill #0

## 2015-03-29 MED FILL — FUROSEMIDE 20 MG TABLET: 20 | 30 days supply | Qty: 30 | Fill #0

## 2015-03-29 NOTE — Telephone Encounter (Signed)
Samples at front desk. Attempted to reach pt, phone is not accepting calls or voicemails.

## 2015-03-30 NOTE — Telephone Encounter (Signed)
Called pt and spoke with him to inform him that his samples were at the front desk awaiting pick up. He voiced understanding

## 2015-04-01 ENCOUNTER — Encounter: Payer: Self-pay | Admitting: Gastroenterology

## 2015-04-15 MED FILL — AMLODIPINE BESYLATE 5 MG TA: 5 | 30 days supply | Qty: 30 | Fill #1

## 2015-04-25 ENCOUNTER — Telehealth: Payer: Self-pay | Admitting: Physician Assistant

## 2015-04-25 MED ORDER — HYDROCODONE-ACETAMINOPHEN 10-325 MG PO TABS: 1.0000 | ORAL_TABLET | Freq: Four times a day (QID) | ORAL | Status: DC | PRN

## 2015-04-25 NOTE — Telephone Encounter (Signed)
Refill granted. Can pick up. He has to give UDS sample at pickup.

## 2015-04-25 NOTE — Telephone Encounter (Signed)
Requesting: Hydrocodone Contract  Signed on 05/03/2014 UDS  Low risk, due now Last OV   02/22/2015 Last Refill   #120 with 0 refills on 03/28/2015  Please Advise

## 2015-04-25 NOTE — Telephone Encounter (Signed)
Pt is requesting refill on hydrocodone. He has a day or 2 left. He takes 4/day. Please call when RX ready for pick up at [phone].

## 2015-04-26 ENCOUNTER — Telehealth: Payer: Self-pay | Admitting: Cardiology

## 2015-04-26 DIAGNOSIS — Z79891 Long term (current) use of opiate analgesic: Secondary | ICD-10-CM | POA: Diagnosis not present

## 2015-04-26 MED ORDER — APIXABAN 5 MG PO TABS: 5.0000 mg | ORAL_TABLET | Freq: Two times a day (BID) | ORAL | Status: DC

## 2015-04-26 NOTE — Telephone Encounter (Signed)
ELIQUIS SAMPLES AVAILABLE  X 2  PATIENT AWARE

## 2015-04-26 NOTE — Telephone Encounter (Signed)
Patient calling the office for samples of medication:   1.  What medication and dosage are you requesting samples for?Eliquis  2.  Are you currently out of this medication? No,will need some by the end of the week

## 2015-04-27 MED FILL — METOPROLOL SUCC ER 100 MG T: 100 | 30 days supply | Qty: 60 | Fill #0 | Status: TO

## 2015-04-27 MED FILL — HYDROCODON-APAP 10-325: 10-325 | 30 days supply | Qty: 120 | Fill #0

## 2015-04-29 ENCOUNTER — Telehealth: Payer: Self-pay | Admitting: Gastroenterology

## 2015-04-29 NOTE — Telephone Encounter (Signed)
Informed patient that we do not have a free sample of Suprep currently in the office for him. Offered to mail him new instructions for Miralax prep and he can purchase the items over the counter. Patient agreed and will do Miralax prep. Mailed instructions and informed him to call our office if he does not receive them. Pt verbalized understanding.

## 2015-05-04 MED FILL — FUROSEMIDE 20 MG TABLET: 20 | 30 days supply | Qty: 30 | Fill #1

## 2015-05-10 ENCOUNTER — Ambulatory Visit (AMBULATORY_SURGERY_CENTER): Payer: Commercial Managed Care - HMO | Admitting: Gastroenterology

## 2015-05-10 ENCOUNTER — Encounter: Payer: Self-pay | Admitting: Gastroenterology

## 2015-05-10 VITALS — BP 153/87 | HR 77 | Temp 96.9°F | Resp 11 | Ht 72.0 in | Wt 222.0 lb

## 2015-05-10 DIAGNOSIS — D123 Benign neoplasm of transverse colon: Secondary | ICD-10-CM | POA: Diagnosis not present

## 2015-05-10 DIAGNOSIS — K219 Gastro-esophageal reflux disease without esophagitis: Secondary | ICD-10-CM

## 2015-05-10 DIAGNOSIS — I4891 Unspecified atrial fibrillation: Secondary | ICD-10-CM | POA: Diagnosis not present

## 2015-05-10 DIAGNOSIS — D124 Benign neoplasm of descending colon: Secondary | ICD-10-CM

## 2015-05-10 DIAGNOSIS — I10 Essential (primary) hypertension: Secondary | ICD-10-CM | POA: Diagnosis not present

## 2015-05-10 DIAGNOSIS — K317 Polyp of stomach and duodenum: Secondary | ICD-10-CM

## 2015-05-10 DIAGNOSIS — R1012 Left upper quadrant pain: Secondary | ICD-10-CM | POA: Diagnosis not present

## 2015-05-10 DIAGNOSIS — G4733 Obstructive sleep apnea (adult) (pediatric): Secondary | ICD-10-CM | POA: Diagnosis not present

## 2015-05-10 DIAGNOSIS — Z86010 Personal history of colonic polyps: Secondary | ICD-10-CM | POA: Diagnosis present

## 2015-05-10 DIAGNOSIS — I2510 Atherosclerotic heart disease of native coronary artery without angina pectoris: Secondary | ICD-10-CM | POA: Diagnosis not present

## 2015-05-10 DIAGNOSIS — J439 Emphysema, unspecified: Secondary | ICD-10-CM | POA: Diagnosis not present

## 2015-05-10 MED ORDER — SODIUM CHLORIDE 0.9 % IV SOLN: 500.0000 mL | INTRAVENOUS | Status: DC

## 2015-05-10 NOTE — Op Note (Signed)
[facility] Patient Name: [name] Woods Procedure Date: 05/10/2015 2:54 PM MRN: [id number] Endoscopist: Ladene Artist MD, MD Age: 75 Date of Birth: 1940-05-30 Gender: Male Procedure:                Colonoscopy Indications:              Surveillance: Personal history of adenomatous                            polyps on last colonoscopy > 5 years ago Medicines:                Monitored Anesthesia Care Procedure:                Pre-Anesthesia Assessment:                           - Prior to the procedure, a History and Physical                            was performed, and patient medications and                            allergies were reviewed. The patient's tolerance of                            previous anesthesia was also reviewed. The risks                            and benefits of the procedure and the sedation                            options and risks were discussed with the patient.                            All questions were answered, and informed consent                            was obtained. Prior Anticoagulants: The patient has                            taken Eliquis (apixaban), last dose was 2 days                            prior to procedure. ASA Grade Assessment: III - A                            patient with severe systemic disease. After                            reviewing the risks and benefits, the patient was                            deemed in satisfactory condition to undergo the  procedure.                           After obtaining informed consent, the colonoscope                            was passed under direct vision. Throughout the                            procedure, the patient's blood pressure, pulse, and                            oxygen saturations were monitored continuously. The                            Model PCF-[id number]) scope was introduced                            through the  anus and advanced to the the cecum,                            identified by appendiceal orifice and ileocecal                            valve. The colonoscopy was performed without                            difficulty. The patient tolerated the procedure                            well. The quality of the bowel preparation was                            good. The ileocecal valve, appendiceal orifice, and                            rectum were photographed. Scope In: 3:00:58 PM Scope Out: 3:21:44 PM Scope Withdrawal Time: 0 hours 19 minutes 3 seconds  Total Procedure Duration: 0 hours 20 minutes 46 seconds  Findings:                 The digital rectal exam was normal.                           Six sessile polyps were found in the descending                            colon (2) and transverse colon (4). The polyps were                            6 to 8 mm in size. These polyps were removed with a                            cold snare. Resection and retrieval were complete.  Many small-mouthed diverticula were found in the                            sigmoid colon.                           The exam was otherwise normal throughout the                            examined colon.                           The retroflexed view of the distal rectum and anal                            verge was normal and showed no anal or rectal                            abnormalities. Complications:            No immediate complications. Estimated Blood Loss:     Estimated blood loss was minimal. Impression:               - Six 6 to 8 mm polyps in the descending colon and                            in the transverse colon, removed with a cold snare.                            Resected and retrieved.                           - Diverticulosis in the sigmoid colon. Recommendation:           - Patient has a contact number available for                            emergencies. The signs  and symptoms of potential                            delayed complications were discussed with the                            patient. Return to normal activities tomorrow.                            Written discharge instructions were provided to the                            patient.                           - Resume previous diet.                           - Resume Eliquis (apixaban) at prior dose in 3  days. Refer to managing physician for further                            adjustment of therapy.                           - No aspirin, ibuprofen, naproxen, or other                            non-steroidal anti-inflammatory drugs for 2 weeks                            after polyp removal.                           - Await pathology results.                           - Repeat colonoscopy in 3 years for surveillance. Ladene Artist MD, MD 05/10/2015 3:33:54 PM This report has been signed electronically.

## 2015-05-10 NOTE — Patient Instructions (Signed)
YOU HAD AN ENDOSCOPIC PROCEDURE TODAY AT THE [facility]:   Refer to the procedure report that was given to you for any specific questions about what was found during the examination.  If the procedure report does not answer your questions, please call your gastroenterologist to clarify.  If you requested that your care partner not be given the details of your procedure findings, then the procedure report has been included in a sealed envelope for you to review at your convenience later.  YOU SHOULD EXPECT: Some feelings of bloating in the abdomen. Passage of more gas than usual.  Walking can help get rid of the air that was put into your GI tract during the procedure and reduce the bloating. If you had a lower endoscopy (such as a colonoscopy or flexible sigmoidoscopy) you may notice spotting of blood in your stool or on the toilet paper. If you underwent a bowel prep for your procedure, you may not have a normal bowel movement for a few days.  Please Note:  You might notice some irritation and congestion in your nose or some drainage.  This is from the oxygen used during your procedure.  There is no need for concern and it should clear up in a day or so.  SYMPTOMS TO REPORT IMMEDIATELY:   Following lower endoscopy (colonoscopy or flexible sigmoidoscopy):  Excessive amounts of blood in the stool  Significant tenderness or worsening of abdominal pains  Swelling of the abdomen that is new, acute  Fever of 100F or higher   Following upper endoscopy (EGD)  Vomiting of blood or coffee ground material  New chest pain or pain under the shoulder blades  Painful or persistently difficult swallowing  New shortness of breath  Fever of 100F or higher  Black, tarry-looking stools  For urgent or emergent issues, a gastroenterologist can be reached at any hour by calling [phone].   DIET: Your first meal following the procedure should be a small meal and then it is ok to progress to  your normal diet. Heavy or fried foods are harder to digest and may make you feel nauseous or bloated.  Likewise, meals heavy in dairy and vegetables can increase bloating.  Drink plenty of fluids but you should avoid alcoholic beverages for 24 hours.  ACTIVITY:  You should plan to take it easy for the rest of today and you should NOT DRIVE or use heavy machinery until tomorrow (because of the sedation medicines used during the test).    FOLLOW UP: Our staff will call the number listed on your records the next business day following your procedure to check on you and address any questions or concerns that you may have regarding the information given to you following your procedure. If we do not reach you, we will leave a message.  However, if you are feeling well and you are not experiencing any problems, there is no need to return our call.  We will assume that you have returned to your regular daily activities without incident.  If any biopsies were taken you will be contacted by phone or by letter within the next 1-3 weeks.  Please call us at [phone] if you have not heard about the biopsies in 3 weeks.    SIGNATURES/CONFIDENTIALITY: You and/or your care partner have signed paperwork which will be entered into your electronic medical record.  These signatures attest to the fact that that the information above on your After Visit Summary has been reviewed   and is understood.  Full responsibility of the confidentiality of this discharge information lies with you and/or your care-partner.     Handouts were given to your care partner on GERD, polyps, diverticulosis, and a high fiber diet with liberal fluid intake. Resume ELIQUIS in 3 days on Friday.Refer to managing physician for further adjustment of therapy. No aspirin, aspirin products or NSAIDs for 2 weeks. You may resume your current medications today. Await biopsy results. Please call if any questions or concerns.

## 2015-05-10 NOTE — Progress Notes (Signed)
No problems noted in the recovery room. maw 

## 2015-05-10 NOTE — Op Note (Signed)
[name] Woods: [name] Woods Procedure Date: 05/10/2015 3:22 PM MRN: [id number] Endoscopist: Ladene Artist MD, MD Age: 75 Date of Birth: [birth year] Gender: Male Procedure:                Upper GI endoscopy Indications:              Abdominal pain in the left upper quadrant,                            Suspected gastro-esophageal reflux disease Medicines:                Monitored Anesthesia Care Procedure:                Pre-Anesthesia Assessment:                           - Prior to the procedure, a History and Physical                            was performed, and patient medications and                            allergies were reviewed. The patient's tolerance of                            previous anesthesia was also reviewed. The risks                            and benefits of the procedure and the sedation                            options and risks were discussed with the patient.                            All questions were answered, and informed consent                            was obtained. Prior Anticoagulants: The patient has                            taken Eliquis (apixaban), last dose was 2 days                            prior to procedure. ASA Grade Assessment: III - A                            patient with severe systemic disease. After                            reviewing the risks and benefits, the patient was                            deemed in satisfactory condition to undergo the  procedure.                           - Prior to the procedure, a History and Physical                            was performed, and patient medications and                            allergies were reviewed. The patient's tolerance of                            previous anesthesia was also reviewed. The risks                            and benefits of the procedure and the sedation                            options and risks  were discussed with the patient.                            All questions were answered, and informed consent                            was obtained. Prior Anticoagulants: The patient has                            taken Eliquis (apixaban), last dose was 2 days                            prior to procedure. ASA Grade Assessment: III - A                            patient with severe systemic disease. After                            reviewing the risks and benefits, the patient was                            deemed in satisfactory condition to undergo the                            procedure.                           After obtaining informed consent, the endoscope was                            passed under direct vision. Throughout the                            procedure, the patient's blood pressure, pulse, and  oxygen saturations were monitored continuously. The                            Model GIF-[id number]) scope was introduced                            through the mouth, and advanced to the second part                            of duodenum. The upper GI endoscopy was                            accomplished without difficulty. The patient                            tolerated the procedure well. Scope In: Scope Out: Findings:                 A mild Schatzki ring (acquired) was found at the                            gastroesophageal junction.                           The exam of the esophagus was otherwise normal.                           A single 5 mm sessile polyp with no bleeding and no                            stigmata of recent bleeding was found in the                            gastric body. The polyp was removed with a cold                            biopsy forceps. Resection and retrieval were                            complete.                           A small hiatal hernia was present.                           The exam of the  stomach was otherwise normal.                           The cardia and gastric fundus were otherwise normal                            on retroflexion.                           A 10 mm non-bleeding diverticulum was found in the  area of the papilla.                           The exam of the duodenum was otherwise normal. Complications:            No immediate complications. Estimated Blood Loss:     Estimated blood loss was minimal. Impression:               - Mild Schatzki ring.                           - A single gastric polyp. Resected and retrieved.                           - Small hiatal hernia.                           - Non-bleeding duodenal diverticulum. Recommendation:           - Patient has a contact number available for                            emergencies. The signs and symptoms of potential                            delayed complications were discussed with the                            patient. Return to normal activities tomorrow.                            Written discharge instructions were provided to the                            patient.                           - Resume previous diet.                           - Resume Eliquis (apixaban) at prior dose in 3                            days. Refer to managing physician for further                            adjustment of therapy.                           - No aspirin, ibuprofen, naproxen, or other                            non-steroidal anti-inflammatory drugs for 2 weeks                            after polyp removal.                           -  Await pathology results.                           - Follow an antireflux regimen [Duration]. Ladene Artist MD, MD 05/10/2015 3:38:53 PM This report has been signed electronically.

## 2015-05-10 NOTE — Progress Notes (Signed)
Report to PACU, RN, vss, BBS= Clear.  

## 2015-05-10 NOTE — Progress Notes (Signed)
Called to room to assist during endoscopic procedure.  Patient ID and intended procedure confirmed with present staff. Received instructions for my participation in the procedure from the performing physician.  

## 2015-05-11 ENCOUNTER — Telehealth: Payer: Self-pay

## 2015-05-11 NOTE — Telephone Encounter (Signed)
Left message on answering machine. 

## 2015-05-16 ENCOUNTER — Other Ambulatory Visit: Payer: Self-pay | Admitting: Cardiology

## 2015-05-16 NOTE — Telephone Encounter (Signed)
Patient calling the office for samples of medication: ° ° °1.  What medication and dosage are you requesting samples for? °Eliquis 5 mg  °2.  Are you currently out of this medication?  °Yes ° ° ° °

## 2015-05-16 NOTE — Telephone Encounter (Signed)
Patient notified that no eliquis 5mg  samples are available. He states he is NOT out of the medication - reinforced how important it is to NOT run out. He will check back later this week for samples

## 2015-05-18 ENCOUNTER — Encounter: Payer: Self-pay | Admitting: Gastroenterology

## 2015-05-19 ENCOUNTER — Telehealth: Payer: Self-pay | Admitting: Cardiology

## 2015-05-19 NOTE — Telephone Encounter (Signed)
Patient notified that we currently do not have samples at [facility]. Patient has used a 30-day free card. He did not want to new prescription sent to his pharmacy.  I advised patient to contact [address] to speak with their refill/sample dept regarding Eliquis samples.

## 2015-05-19 NOTE — Telephone Encounter (Signed)
Patient calling the office for samples of medication: ° ° °1.  What medication and dosage are you requesting samples for?  Eliquis  ° °2.  Are you currently out of this medication? no ° ° °

## 2015-05-20 ENCOUNTER — Telehealth: Payer: Self-pay | Admitting: Cardiology

## 2015-05-20 NOTE — Telephone Encounter (Signed)
Pt called for samples of Eliquis 5 mg please.  He says thanks you!

## 2015-05-20 NOTE — Telephone Encounter (Signed)
Samples given to patient  

## 2015-05-23 ENCOUNTER — Telehealth: Payer: Self-pay | Admitting: Physician Assistant

## 2015-05-23 MED ORDER — HYDROCODONE-ACETAMINOPHEN 10-325 MG PO TABS: 1.0000 | ORAL_TABLET | Freq: Four times a day (QID) | ORAL | Status: DC | PRN

## 2015-05-23 NOTE — Telephone Encounter (Signed)
Rx refill ready for pickup. Can fill when due.

## 2015-05-23 NOTE — Telephone Encounter (Signed)
Relation to [id number] Call back number:[phone]  Reason for call:  Patient requesting a refill HYDROcodone-acetaminophen (NORCO) 10-325 MG tablet

## 2015-05-24 NOTE — Telephone Encounter (Signed)
Informed pt of the below. Pt will be in for pick up

## 2015-05-26 ENCOUNTER — Telehealth: Payer: Self-pay | Admitting: Cardiology

## 2015-05-26 MED FILL — HYDROCODON-APAP 10-325: 10-325 | 30 days supply | Qty: 120 | Fill #0

## 2015-05-26 NOTE — Telephone Encounter (Signed)
Medication samples have been provided to the patient.  Drug name: eliquis 5mg   Qty: 3 boxes  LOT: [id number]  Exp.Date: 09/2017  Samples left at front desk for patient pick-up. Patient notified. He states he was given 1 box of samples on 05/20/15 by M. Isley at [facility]  Sheral Apley M 2:48 PM 05/26/2015

## 2015-05-26 NOTE — Telephone Encounter (Signed)
New message       Patient calling the office for samples of medication:   1.  What medication and dosage are you requesting samples for? eliquis 5  2.  Are you currently out of this medication?  Almost out

## 2015-06-01 MED FILL — CARTIA XT 180 MG CAPSULE SA: 180 | 90 days supply | Qty: 90 | Fill #1 | Status: TO

## 2015-06-07 ENCOUNTER — Other Ambulatory Visit: Payer: Self-pay | Admitting: Physician Assistant

## 2015-06-07 ENCOUNTER — Ambulatory Visit (INDEPENDENT_AMBULATORY_CARE_PROVIDER_SITE_OTHER): Payer: Commercial Managed Care - HMO | Admitting: Physician Assistant

## 2015-06-07 ENCOUNTER — Encounter: Payer: Self-pay | Admitting: Physician Assistant

## 2015-06-07 VITALS — BP 110/70 | HR 89 | Temp 97.9°F | Resp 18 | Ht 72.0 in | Wt 215.0 lb

## 2015-06-07 DIAGNOSIS — M6248 Contracture of muscle, other site: Secondary | ICD-10-CM

## 2015-06-07 DIAGNOSIS — M62838 Other muscle spasm: Secondary | ICD-10-CM

## 2015-06-07 DIAGNOSIS — L723 Sebaceous cyst: Secondary | ICD-10-CM

## 2015-06-07 MED ORDER — TIZANIDINE HCL 4 MG PO TABS: 4.0000 mg | ORAL_TABLET | Freq: Three times a day (TID) | ORAL | Status: DC | PRN

## 2015-06-07 MED FILL — tiZANidine HCL 4 MG TABS: 4 | 10 days supply | Qty: 30 | Fill #0

## 2015-06-07 NOTE — Progress Notes (Signed)
Patient presents to clinic today c/o flare up of cervical neck pain over the past 3 days after mowing the yard. Endorses pain is constant and worse with ROM. Is aching but sometimes stabbing in nature, a 10/10 at its worst. Has been taking chronic Norco with only minimal relief in symptoms. Denies radiation of pain into upper extremities.  Past Medical History  Diagnosis Date  . Ventricular hypertrophy   . Persistent atrial fibrillation ([place])   . Hiatal hernia   . 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 Cholelithiasis   . Sleep apnea   . Nephrolithiasis   . Arthritis   . Hypertension   . Ankylosing spondylitis ([place])   . GERD (gastroesophageal reflux disease)   . CAD (coronary artery disease) [id number]    30% mid LAD lesion on cardiac cath  . Chicken pox   . [place] measles     Current Outpatient Prescriptions on File Prior to Visit  Medication Sig Dispense Refill  . allopurinol (ZYLOPRIM) 300 MG tablet Take 1 tablet (300 mg total) by mouth daily. 30 tablet 2  . amLODipine (NORVASC) 5 MG tablet Take 1 tablet (5 mg total) by mouth daily. (Patient taking differently: Take 5 mg by mouth as needed. ) 30 tablet 5  . apixaban (ELIQUIS) 5 MG TABS tablet Take 1 tablet (5 mg total) by mouth 2 (two) times daily. 28 tablet 0  . diltiazem (CARDIZEM CD) 180 MG 24 hr capsule Take 1 capsule (180 mg total) by mouth daily. 90 capsule 3  . furosemide (LASIX) 20 MG tablet Take 1 tablet (20 mg total) by mouth daily. 30 tablet 5  . HYDROcodone-acetaminophen (NORCO) 10-325 MG tablet Take 1 tablet by mouth every 6 (six) hours as needed. 120 tablet 0  . Loperamide HCl (IMODIUM PO) Take by mouth as needed. Reported on 05/10/2015    . metoprolol succinate (TOPROL-XL) 100 MG 24 hr tablet TAKE 1 TABLET BY MOUTH 2 TIMES DAILY. 60 tablet 5  . omeprazole (PRILOSEC) 40 MG capsule Take 1 capsule (40 mg total) by mouth daily. 30 capsule 5  . predniSONE (DELTASONE) 20 MG tablet Take 1 tablet (20 mg total) by mouth as  needed. (Patient not taking: Reported on 06/07/2015) 10 tablet 0   No current facility-administered medications on file prior to visit.    Allergies  Allergen Reactions  . Coumadin [Warfarin]     GI bleeds    Family History  Problem Relation Age of Onset  . Colon cancer Neg Hx   . Heart disease Father 33    Deceased  . Heart attack Father 16  . Heart disease Brother 21    "Died in his sleep"  . Arthritis/Rheumatoid Mother     Deceased-86  . Dementia Paternal Grandmother   . Cancer Paternal Uncle   . Hyperlipidemia Brother   . Stroke Brother 43    Deceased  . Other Sister     Estate agent in [place]  . Other Sister     MVA  . Other Brother     Carjacked-killed  . Healthy Brother   . Healthy Son     X3  . Healthy Daughter     x1    Social History   Social History  . Marital Status: Married    Spouse Name: N/A  . Number of Children: 6  . Years of Education: N/A   Occupational History  . Retired     Estate manager/land agent   Social History Main Topics  .  Smoking status: Never Smoker   . Smokeless tobacco: Never Used  . Alcohol Use: No  . Drug Use: No  . Sexual Activity: Not Asked   Other Topics Concern  . None   Social History Narrative   4 caffeine drinks daily    Review of Systems - See HPI.  All other ROS are negative.  BP 110/70 mmHg  Pulse 89  Temp(Src) 97.9 F (36.6 C) (Oral)  Resp 18  Ht 6' (1.829 m)  Wt 215 lb (97.523 kg)  BMI 29.15 kg/m2  SpO2 98%  Physical Exam  Constitutional: He is oriented to person, place, and time and well-developed, well-nourished, and in no distress.  HENT:  Head: Normocephalic and atraumatic.  Eyes: Conjunctivae are normal.  Cardiovascular: Normal rate, regular rhythm, normal heart sounds and intact distal pulses.   Pulmonary/Chest: Effort normal and breath sounds normal. No respiratory distress. He has no wheezes. He has no rales. He exhibits no tenderness.  Musculoskeletal:       Cervical back: He exhibits tenderness and  spasm. He exhibits normal range of motion and no bony tenderness.  Neurological: He is alert and oriented to person, place, and time.  Skin: Skin is warm and dry. No rash noted.     Psychiatric: Affect normal.  Vitals reviewed.  Assessment/Plan: 1. Sebaceous cyst Referral to Dermatology placed for further assessment.  - Ambulatory referral to Dermatology  2. Muscle spasms of neck Rx Zanaflex. Continue Norco. Supportive measures reviewed. FU if not improving.

## 2015-06-07 NOTE — Patient Instructions (Signed)
Please take the Zanaflex as directed. Continue Hydrocodone -- you can take 2 every 6-8 hours if needed over the next few days.  Please continue Salon [place] and heating pad (only leave heat on for 10 minutes). No heavy lifting or overexertion.  Follow-up if symptoms are not resolving.  You will be contacted by Dermatology for removal of cysts.

## 2015-06-07 NOTE — Progress Notes (Signed)
Pre visit review using our clinic review tool, if applicable. No additional management support is needed unless otherwise documented below in the visit note/SLS  

## 2015-06-08 ENCOUNTER — Encounter: Payer: Self-pay | Admitting: Physician Assistant

## 2015-06-08 MED FILL — FUROSEMIDE 20 MG TABLET: 20 | 30 days supply | Qty: 30 | Fill #0 | Status: TO

## 2015-06-08 NOTE — Telephone Encounter (Signed)
Refill sent per [facility] refill protocol/SLS  

## 2015-06-16 ENCOUNTER — Emergency Department (HOSPITAL_COMMUNITY)
Admission: EM | Admit: 2015-06-16 | Discharge: 2015-06-16 | Disposition: A | Discharge status: Home / Self Care | Payer: Commercial Managed Care - HMO | Attending: Emergency Medicine | Admitting: Emergency Medicine

## 2015-06-16 ENCOUNTER — Other Ambulatory Visit: Payer: Self-pay | Admitting: Cardiology

## 2015-06-16 ENCOUNTER — Telehealth: Payer: Self-pay | Admitting: Cardiology

## 2015-06-16 ENCOUNTER — Encounter (HOSPITAL_COMMUNITY): Payer: Self-pay

## 2015-06-16 ENCOUNTER — Emergency Department (HOSPITAL_COMMUNITY): Payer: Commercial Managed Care - HMO

## 2015-06-16 DIAGNOSIS — G8929 Other chronic pain: Secondary | ICD-10-CM | POA: Diagnosis not present

## 2015-06-16 DIAGNOSIS — Z7901 Long term (current) use of anticoagulants: Secondary | ICD-10-CM | POA: Insufficient documentation

## 2015-06-16 DIAGNOSIS — K219 Gastro-esophageal reflux disease without esophagitis: Secondary | ICD-10-CM | POA: Insufficient documentation

## 2015-06-16 DIAGNOSIS — Z9889 Other specified postprocedural states: Secondary | ICD-10-CM | POA: Insufficient documentation

## 2015-06-16 DIAGNOSIS — Z8619 Personal history of other infectious and parasitic diseases: Secondary | ICD-10-CM | POA: Diagnosis not present

## 2015-06-16 DIAGNOSIS — Z87442 Personal history of urinary calculi: Secondary | ICD-10-CM | POA: Insufficient documentation

## 2015-06-16 DIAGNOSIS — Z86018 Personal history of other benign neoplasm: Secondary | ICD-10-CM | POA: Diagnosis not present

## 2015-06-16 DIAGNOSIS — Z79899 Other long term (current) drug therapy: Secondary | ICD-10-CM | POA: Insufficient documentation

## 2015-06-16 DIAGNOSIS — R51 Headache: Secondary | ICD-10-CM | POA: Insufficient documentation

## 2015-06-16 DIAGNOSIS — I2510 Atherosclerotic heart disease of native coronary artery without angina pectoris: Secondary | ICD-10-CM | POA: Diagnosis not present

## 2015-06-16 DIAGNOSIS — R5383 Other fatigue: Secondary | ICD-10-CM | POA: Diagnosis not present

## 2015-06-16 DIAGNOSIS — M1990 Unspecified osteoarthritis, unspecified site: Secondary | ICD-10-CM | POA: Insufficient documentation

## 2015-06-16 DIAGNOSIS — I10 Essential (primary) hypertension: Secondary | ICD-10-CM | POA: Insufficient documentation

## 2015-06-16 DIAGNOSIS — G4730 Sleep apnea, unspecified: Secondary | ICD-10-CM | POA: Insufficient documentation

## 2015-06-16 DIAGNOSIS — Z9981 Dependence on supplemental oxygen: Secondary | ICD-10-CM | POA: Diagnosis not present

## 2015-06-16 DIAGNOSIS — R42 Dizziness and giddiness: Secondary | ICD-10-CM | POA: Diagnosis present

## 2015-06-16 DIAGNOSIS — I499 Cardiac arrhythmia, unspecified: Secondary | ICD-10-CM | POA: Diagnosis not present

## 2015-06-16 LAB — I-STAT TROPONIN, ED: Troponin i, poc: 0 ng/mL (ref 0.00–0.08)

## 2015-06-16 LAB — CBC
HEMATOCRIT: 49.5 % (ref 39.0–52.0)
Hemoglobin: 16.4 g/dL (ref 13.0–17.0)
MCH: 28.7 pg (ref 26.0–34.0)
MCHC: 33.1 g/dL (ref 30.0–36.0)
MCV: 86.7 fL (ref 78.0–100.0)
PLATELETS: 193 10*3/uL (ref 150–400)
RBC: 5.71 MIL/uL (ref 4.22–5.81)
RDW: 13.4 % (ref 11.5–15.5)
WBC: 6.5 10*3/uL (ref 4.0–10.5)

## 2015-06-16 LAB — URINALYSIS, ROUTINE W REFLEX MICROSCOPIC
Bilirubin Urine: NEGATIVE
Glucose, UA: NEGATIVE mg/dL
Hgb urine dipstick: NEGATIVE
Ketones, ur: NEGATIVE mg/dL
Leukocytes, UA: NEGATIVE
Nitrite: NEGATIVE
Protein, ur: NEGATIVE mg/dL
Specific Gravity, Urine: 1.008 (ref 1.005–1.030)
pH: 6.5 (ref 5.0–8.0)

## 2015-06-16 LAB — BASIC METABOLIC PANEL
Anion gap: 9 (ref 5–15)
BUN: 10 mg/dL (ref 6–20)
CHLORIDE: 103 mmol/L (ref 101–111)
CO2: 29 mmol/L (ref 22–32)
CREATININE: 1.19 mg/dL (ref 0.61–1.24)
Calcium: 8.9 mg/dL (ref 8.9–10.3)
GFR calc Af Amer: >60 mL/min (ref >60)
GFR calc non Af Amer: 58 mL/min — ABNORMAL LOW (ref >60)
GLUCOSE: 95 mg/dL (ref 65–99)
POTASSIUM: 3.9 mmol/L (ref 3.5–5.1)
SODIUM: 141 mmol/L (ref 135–145)

## 2015-06-16 LAB — TSH: TSH: 2.352 u[IU]/mL (ref 0.350–4.500)

## 2015-06-16 MED ORDER — APIXABAN 5 MG PO TABS: 5.0000 mg | ORAL_TABLET | Freq: Two times a day (BID) | ORAL | Status: DC

## 2015-06-16 NOTE — ED Notes (Signed)
Pt states, "Felt like I had a bird in my chest."  Pt reports fluttering feeling would last 10-15 minutes, intermittent, through Tuesday, resolved at this time.

## 2015-06-16 NOTE — ED Notes (Signed)
Patient here with 2 days of increased BP , headache and dizziness. Spouse concerned that he has heart failure and has had increased sleeping for same. Denies cp, no shortness of breath

## 2015-06-16 NOTE — Telephone Encounter (Signed)
Left message on voice mail, no samples available  please call back if medication needs to be called to pharmacy

## 2015-06-16 NOTE — Telephone Encounter (Signed)
New message     Pt was wanting to see if the Md has any samples of Eliquis 5 mg taking 2 a daily   Patient calling the office for samples of medication:   1.  What medication and dosage are you requesting samples for? Elquis 5mg  po taking twice a daily  2.  Are you currently out of this medication? no

## 2015-06-16 NOTE — Discharge Instructions (Signed)
Fatigue  Fatigue is feeling tired all of the time, a lack of energy, or a lack of motivation. Occasional or mild fatigue is often a normal response to activity or life in general. However, long-lasting (chronic) or extreme fatigue may indicate an underlying medical condition.  HOME CARE INSTRUCTIONS   Watch your fatigue for any changes. The following actions may help to lessen any discomfort you are feeling:  · Talk to your health care provider about how much sleep you need each night. Try to get the required amount every night.  · Take medicines only as directed by your health care provider.  · Eat a healthy and nutritious diet. Ask your health care provider if you need help changing your diet.  · Drink enough fluid to keep your urine clear or pale yellow.  · Practice ways of relaxing, such as yoga, meditation, massage therapy, or acupuncture.  · Exercise regularly.    · Change situations that cause you stress. Try to keep your work and personal routine reasonable.  · Do not abuse illegal drugs.  · Limit alcohol intake to no more than 1 drink per day for nonpregnant women and 2 drinks per day for men. One drink equals 12 ounces of beer, 5 ounces of wine, or 1½ ounces of hard liquor.  · Take a multivitamin, if directed by your health care provider.  SEEK MEDICAL CARE IF:   · Your fatigue does not get better.  · You have a fever.    · You have unintentional weight loss or gain.  · You have headaches.    · You have difficulty:      Falling asleep.    Sleeping throughout the night.  · You feel angry, guilty, anxious, or sad.     · You are unable to have a bowel movement (constipation).    · You skin is dry.     · Your legs or another part of your body is swollen.    SEEK IMMEDIATE MEDICAL CARE IF:   · You feel confused.    · Your vision is blurry.  · You feel faint or pass out.    · You have a severe headache.    · You have severe abdominal, pelvic, or back pain.    · You have chest pain, shortness of breath, or an  irregular or fast heartbeat.    · You are unable to urinate or you urinate less than normal.    · You develop abnormal bleeding, such as bleeding from the rectum, vagina, nose, lungs, or nipples.  · You vomit blood.     · You have thoughts about harming yourself or committing suicide.    · You are worried that you might harm someone else.       This information is not intended to replace advice given to you by your health care provider. Make sure you discuss any questions you have with your health care provider.     Document Released: 11/12/2006 Document Revised: 02/05/2014 Document Reviewed: 05/19/2013  Elsevier Interactive Patient Education ©2016 [employer]

## 2015-06-16 NOTE — Telephone Encounter (Signed)
New Message    Pt c/o BP issue:  1. What are your last 5 BP readings?   189/112 at 4am 135/91 181/113 148/86. Pulse of 58  2. Are you having any other symptoms (ex. Dizziness, headache, blurred vision, passed out)? Yes! Dizziness and disorientation.   3. What is your medication issue?  Pt wife called states that the pt has been in and out of it with flutters in his chest. Bp has jumped fairly high to 189/112 at 4am.   Pt wife states that she gave him another dose metoprolol succinate (TOPROL-XL) 100 MG 24 hr tablet and that brought it down after 45 minutes to 135/91. Within 30 minutes it shot up to 181/113 and currently its 148/86. Pulse of 58.   Will he need an ov or will he need to go to the ED. Please call

## 2015-06-16 NOTE — Telephone Encounter (Signed)
Rx request sent to pharmacy.  

## 2015-06-16 NOTE — ED Provider Notes (Signed)
CSN: [id number]     Arrival date & time 06/16/15  1105 History   First MD Initiated Contact with Patient 06/16/15 1425     Chief Complaint  Patient presents with  . Dizziness  . Headache     (Consider location/radiation/quality/duration/timing/severity/associated sxs/prior Treatment) HPI   75 year old male presenting with his wife for evaluation of generalized fatigue and excessive sleepiness. Onset 2-3 weeks ago. Persistent since then. Neither patient or wife have a good explanation for his symptoms. He'll sleep up to 16 hours a day. No recent medication changes. Does take oxycodone for chronic pain. He has been on this for quite some time though. Fevers or chills. Appetite has been okay. No urinary complaints. No significant weight loss. He does admit to feeling somewhat depressed. He previously was extremely active and found satisfaction in working. She has been able to do less this has gotten older and because of pain complaints. He has a past history of atrial fibrillation on Eliquis. Denies any bright red blood per rectum or melena. That his TSH was recently checked, but he is not sure.    Past Medical History  Diagnosis Date  . Ventricular hypertrophy   . Persistent atrial fibrillation ([place])   . Hiatal hernia   . 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 Cholelithiasis   . Sleep apnea   . Nephrolithiasis   . Arthritis   . Hypertension   . Ankylosing spondylitis ([place])   . GERD (gastroesophageal reflux disease)   . CAD (coronary artery disease) [id number]    30% mid LAD lesion on cardiac cath  . Chicken pox   . [place] measles    Past Surgical History  Procedure Laterality Date  . Cholecystectomy    . Cardiac catheterization  03/2012    30% mid LAD lesion otherwise normal cors, LVEF 65%  . Tonsillectomy    . Left and right heart catheterization with coronary angiogram N/A 03/24/2012    Procedure: LEFT AND RIGHT HEART CATHETERIZATION WITH CORONARY ANGIOGRAM;  Surgeon: Sherren Mocha, MD;  Location: [facility] CATH LAB;  Service: Cardiovascular;  Laterality: N/A;  . Elbow arthroplasty      Left  . Leg fluid removal right     Family History  Problem Relation Age of Onset  . Colon cancer Neg Hx   . Heart disease Father 62    Deceased  . Heart attack Father 22  . Heart disease Brother 15    "Died in his sleep"  . Arthritis/Rheumatoid Mother     Deceased-86  . Dementia Paternal Grandmother   . Cancer Paternal Uncle   . Hyperlipidemia Brother   . Stroke Brother 80    Deceased  . Other Sister     Estate agent in [place]  . Other Sister     MVA  . Other Brother     Carjacked-killed  . Healthy Brother   . Healthy Son     X3  . Healthy Daughter     x1   Social History  Substance Use Topics  . Smoking status: Never Smoker   . Smokeless tobacco: Never Used  . Alcohol Use: No    Review of Systems  All systems reviewed and negative, other than as noted in HPI.   Allergies  Coumadin  Home Medications   Prior to Admission medications   Medication Sig Start Date End Date Taking? Authorizing Provider  acetaminophen (TYLENOL) 500 MG tablet Take 1,000 mg by mouth every 6 (six) hours as needed for headache.  Yes Historical Provider, MD  allopurinol (ZYLOPRIM) 300 MG tablet Take 1 tablet (300 mg total) by mouth daily. 01/21/15 01/21/16 Yes Brunetta Jeans, PA-C  amLODipine (NORVASC) 5 MG tablet Take 1 tablet (5 mg total) by mouth daily. Patient taking differently: Take 5 mg by mouth daily as needed (blood pressure).  09/22/14  Yes Brunetta Jeans, PA-C  apixaban (ELIQUIS) 5 MG TABS tablet Take 1 tablet (5 mg total) by mouth 2 (two) times daily. 06/16/15  Yes Minus Breeding, MD  diltiazem (CARDIZEM CD) 180 MG 24 hr capsule Take 1 capsule (180 mg total) by mouth daily. 09/23/14  Yes Minus Breeding, MD  furosemide (LASIX) 20 MG tablet TAKE 1 TABLET (20 MG TOTAL) BY MOUTH DAILY. 06/08/15  Yes Brunetta Jeans, PA-C  HYDROcodone-acetaminophen (NORCO) 10-325 MG tablet Take 1  tablet by mouth every 6 (six) hours as needed. 05/23/15  Yes Brunetta Jeans, PA-C  Loperamide HCl (IMODIUM PO) Take by mouth as needed. Reported on 05/10/2015   Yes Historical Provider, MD  metoprolol succinate (TOPROL-XL) 100 MG 24 hr tablet TAKE 1 TABLET BY MOUTH 2 TIMES DAILY. 09/22/14  Yes Brunetta Jeans, PA-C  omeprazole (PRILOSEC) 40 MG capsule Take 1 capsule (40 mg total) by mouth daily. 02/15/15  Yes Jessica D Zehr, PA-C  tiZANidine (ZANAFLEX) 4 MG tablet Take 1 tablet (4 mg total) by mouth every 8 (eight) hours as needed for muscle spasms. 06/07/15  Yes Brunetta Jeans, PA-C   BP 151/90 mmHg  Pulse 55  Temp(Src) 97.4 F (36.3 C) (Oral)  Resp 16  Ht 6' (1.829 m)  Wt 215 lb (97.523 kg)  BMI 29.15 kg/m2  SpO2 100% Physical Exam  Constitutional: He is oriented to person, place, and time. He appears well-developed and well-nourished. No distress.  Laying in bed. Tired appearing but not distressed.  HENT:  Head: Normocephalic and atraumatic.  Eyes: Conjunctivae are normal. Right eye exhibits no discharge. Left eye exhibits no discharge.  Neck: Neck supple.  Cardiovascular: Regular rhythm and normal heart sounds.  Exam reveals no gallop and no friction rub.   No murmur heard. Irregularly irregular  Pulmonary/Chest: Effort normal and breath sounds normal. No respiratory distress.  Abdominal: Soft. He exhibits no distension. There is no tenderness.  Musculoskeletal: He exhibits no edema or tenderness.  Neurological: He is alert and oriented to person, place, and time. No cranial nerve deficit. He exhibits normal muscle tone. Coordination normal.  Skin: Skin is warm and dry.  Psychiatric: He has a normal mood and affect. His behavior is normal. Thought content normal.  Speech clear. Content appropriate.  Nursing note and vitals reviewed.   ED Course  Procedures (including critical care time) Labs Review Labs Reviewed  BASIC METABOLIC PANEL - Abnormal; Notable for the following:     GFR calc non Af Amer 58 (*)    All other components within normal limits  CBC  URINALYSIS, ROUTINE W REFLEX MICROSCOPIC (NOT AT [facility])  TSH  Randolm Idol, ED    Imaging Review Dg Chest 2 View  06/16/2015  CLINICAL DATA:  Fluttering in the chest.  Headache. EXAM: CHEST  2 VIEW COMPARISON:  CT chest 08/30/2014 FINDINGS: Mild right basilar scarring. There is no focal parenchymal opacity. There is no pleural effusion or pneumothorax. The heart and mediastinal contours are unremarkable. The osseous structures are unremarkable. IMPRESSION: No active cardiopulmonary disease. Electronically Signed   By: Kathreen Devoid   On: 06/16/2015 11:56   I have personally reviewed  and evaluated these images and lab results as part of my medical decision-making.   EKG Interpretation   Date/Time:  Thursday Jun 16 2015 11:19:30 EDT Ventricular Rate:  61 PR Interval:    QRS Duration: 86 QT Interval:  430 QTC Calculation: 432 R Axis:   75 Text Interpretation:  Atrial fibrillation Anterior infarct , age  undetermined Abnormal ECG No significant change since last tracing  Confirmed by ALLEN  MD, ANTHONY ([id number]) on 06/16/2015 2:25:02 PM      MDM   Final diagnoses:  Other fatigue    75yM with generalized weakness/fatigue. Reports sleeping excessive amounts. Wife is somewhat fixated on his blood pressure. Has a log. Several readings through out the night (2am, 4am, etc). Her concerns are legitimate but I advised her to let the man sleep. There is really no need to check his blood pressure this frequently, particularly in the middle of the night when he isn't having any symptoms.   Reports hx of sleep apnea but couldn't previously get a good fit with CPAP mask. This may potentially be reason for fatigue. May be worth while to revisit this. Might some element of depression as well. Was previously very active and enjoyed working. This has decreased significantly with age and chronic pain. Does take several  doses of oxycodone daily but has for quite some time. No recent change in this. No new meds. Can check TSH but likely wont be resulted during ED stay. Will check UA for possible UTI although no specific urinary symptoms.      Virgel Manifold, MD 06/16/15 [phone]

## 2015-06-16 NOTE — Telephone Encounter (Signed)
Spoke with wife and she stated that the patient has just not been acting right for the last week or so On Sunday they sat on couch for about 12 hours just watching TV and this is unusual but said he just did not feel like doing anything Patient woke up with severe headache and neck pain around 4:00 am and she checked his blood pressure 189/112.  Blood pressure did come down to 135/91 but he was still "out of it" per wife Wife states he has been dizzy/lightheaded, unsteady on his feet, and disoriented. Last 3 days head hurting and fluttering in chest Patient was currently sleeping but when she last woke him he did not really know what had gone on during the night Last blood pressure check 148/86 hr 58 but not checked since 8:30 since he was sleeping Wife unsure if patient had any recent falls, on Eliquis Advised wife she needed to call 911 to have patient evaluated at ED, agreeable to plan and stated she felt something wrong and he needed to be there

## 2015-06-17 ENCOUNTER — Telehealth: Payer: Self-pay | Admitting: Physician Assistant

## 2015-06-17 NOTE — Telephone Encounter (Signed)
Please advise.//AB/CMA 

## 2015-06-17 NOTE — Telephone Encounter (Signed)
Called to follow up with patient and spoke with patient's wife.  Wife states that pt is very tired, fatigue, sleeping more than usual, seems to be depressed.   Says he's not confused, but sometimes forgets where he's going whenever he's driving.  She also says that he's a workaholic and physically is unable to do the work he used to be able to do.  This has caused him to worry about finances and sometimes before irritated with her.   Denied that patient is a harm to himself or others.    Advice: Cancel appt with Sat [facility], come in to discuss current condition with PCP on Monday, May 22nd at 2:30 pm.  If pt's symptoms worsen or new symptoms develop to take patient back to the ER.    Wife agreed with plan and agreed to comply.

## 2015-06-17 NOTE — Telephone Encounter (Signed)
Has appointment scheduled for Monday. Will discuss sleep aids at that time. Please assess gout symptoms.

## 2015-06-17 NOTE — Telephone Encounter (Signed)
Pt wife called stating that he has been very tired and sleeping a lot. She states that he may be depressed. She said that pt fell just a little while ago and called her from the yard. They have a big area. Pt was moving something in the yard and fell. He has scrapes on his back, knees, and elbows. She states pt has been confused as well. Took pt to ER yesterday for the fatigue but the confusion is getting worse. Transferred to [facility] with [facility].

## 2015-06-17 NOTE — Telephone Encounter (Signed)
Can be reached: [phone] Pharmacy: [facility] [place].  Reason for call: Pt states that he is uncomfortable and having trouble sleeping. He is requesting something that will help him rest at night. Pt also states that the gout is flaring up in his feet and asking that prednisone be sent in again for that.

## 2015-06-17 NOTE — Telephone Encounter (Signed)
Called and spoke with the pt and informed him of the note below.  Pt verbalized understanding and agreed.  Pt stated that he will discuss the gout symptoms as well on Monday.//AB/CMA

## 2015-06-17 NOTE — Telephone Encounter (Signed)
Patient Name: [name] Woods DOB: 17-May-1940 Initial Comment Caller says that her husband had a table fall on his back, and he scratched his knees and elbows when he fell. And has a good size scratched on his back. He was able to walk and he is sleeping; as he is doing a lot lately. PT has had confusion for a few months. PT started feeling very tired on Sunday and was at the ER. They recommended a head scan. Was taken to hospital for tiredness yesterday Nurse Assessment Nurse: Ronnald Ramp, RN, Miranda Date/Time (Eastern Time): 06/17/2015 12:34:17 PM Confirm and document reason for call. If symptomatic, describe symptoms. You must click the next button to save text entered. ---Caller states her husband has been c/o fatigue and sleepiness for a week. Seen in ED yesterday and told everything was ok. He fell today while mowing the yard. He had sat a picnic table on it's side and it fell over and his back, knocking him to the ground. Did not hit his head. She is more concerned about the continued fatigue with no diagnosis yesterday. Caller states he is not eating well. She believes he is depressed because he is getting older and not able to do what he normally does. Has the patient traveled out of the country within the last 30 days? ---Not Applicable Does the patient have any new or worsening symptoms? ---Yes Will a triage be completed? ---Yes Related visit to physician within the last 2 weeks? ---Yes Does the PT have any chronic conditions? (i.e. diabetes, asthma, etc.) ---Yes List chronic conditions. ---HTN, chronic pain, Is this a behavioral health or substance abuse call? ---Yes Are you having any thoughts or feelings of harming or killing yourself or someone else? ---No Are you currently experiencing any physical discomfort that you think may be related to the use of alcohol or other drugs? (use substance abuse or alcohol abuse guidelines. These include withdrawal  symptoms) ---No Do you worry that you may be hearing or seeing things that others do not? ---No Do you take medications for your condition(s)? ---No Guidelines Guideline Title Affirmed Question Affirmed Notes Depression Symptoms interfere with work or school Final Disposition User See Physician within 24 Hours Jones, Therapist, sports, Miranda Comments No appt available with PCP or at primary care office. Appt scheduled for tomorrow 5/20 at the [facility] for 10am with Dr. Lacinda Axon. Referrals [facility] Disagree/Comply: Comply

## 2015-06-18 ENCOUNTER — Ambulatory Visit: Payer: Self-pay | Admitting: Family Medicine

## 2015-06-20 ENCOUNTER — Telehealth: Payer: Self-pay | Admitting: Cardiology

## 2015-06-20 ENCOUNTER — Encounter: Payer: Self-pay | Admitting: Physician Assistant

## 2015-06-20 ENCOUNTER — Ambulatory Visit (INDEPENDENT_AMBULATORY_CARE_PROVIDER_SITE_OTHER): Payer: Commercial Managed Care - HMO | Admitting: Physician Assistant

## 2015-06-20 VITALS — BP 160/90 | HR 85 | Temp 97.5°F | Ht 72.0 in | Wt 215.0 lb

## 2015-06-20 DIAGNOSIS — G4733 Obstructive sleep apnea (adult) (pediatric): Secondary | ICD-10-CM

## 2015-06-20 DIAGNOSIS — F329 Major depressive disorder, single episode, unspecified: Secondary | ICD-10-CM | POA: Diagnosis not present

## 2015-06-20 DIAGNOSIS — R5382 Chronic fatigue, unspecified: Secondary | ICD-10-CM | POA: Diagnosis not present

## 2015-06-20 DIAGNOSIS — R4589 Other symptoms and signs involving emotional state: Secondary | ICD-10-CM

## 2015-06-20 MED ORDER — ESCITALOPRAM OXALATE 10 MG PO TABS: 10.0000 mg | ORAL_TABLET | Freq: Every day | ORAL | Status: DC

## 2015-06-20 MED ORDER — HYDROCODONE-ACETAMINOPHEN 10-325 MG PO TABS: 1.0000 | ORAL_TABLET | Freq: Four times a day (QID) | ORAL | Status: DC | PRN

## 2015-06-20 MED FILL — ESCITALOPRAM 10 MG TABLET: 10 | 30 days supply | Qty: 30 | Fill #0

## 2015-06-20 MED FILL — METOPROLOL SUCC ER 100 MG T: 100 | 30 days supply | Qty: 60 | Fill #0

## 2015-06-20 NOTE — Telephone Encounter (Signed)
New message     Patient calling the office for samples of medication:   1.  What medication and dosage are you requesting samples for? Elquis 5 mg po taking twice daily  2.  Are you currently out of this medication? yes

## 2015-06-20 NOTE — Progress Notes (Signed)
Patient presents to clinic today for ER follow-up of fatigue. Patient presented to the ER on 06/16/15 with his wife with c/o 2 weeks of excessive fatigue and sleepiness, stating patient will sleep up to 16 hours per day. No other symptoms reported. Workup included EKG that revealed chronic a fib, rate controlled, negative CXR and labs. Patient was discharged to follow-up here with PCP.  Since discharge, patient endorses continued fatigue. Endorses he feels worthless as he can no longer do the physical labor that he could 20 years ago. Endorses depressed mood. Denies anhedonia or anxiety. Denies SI/HI. Has history of sleep apnea bit does not wear his CPAP. Endorses some difficulty occasionally with recall. Denies vision changes, difficulty with speech or comprehension. Denies focal weakness.   Past Medical History  Diagnosis Date  . Ventricular hypertrophy   . Persistent atrial fibrillation ([place])   . Hiatal hernia   . 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 Cholelithiasis   . Sleep apnea   . Nephrolithiasis   . Arthritis   . Hypertension   . Ankylosing spondylitis ([place])   . GERD (gastroesophageal reflux disease)   . CAD (coronary artery disease) 7026,3785    30% mid LAD lesion on cardiac cath  . Chicken pox   . [place] measles     Current Outpatient Prescriptions on File Prior to Visit  Medication Sig Dispense Refill  . acetaminophen (TYLENOL) 500 MG tablet Take 1,000 mg by mouth every 6 (six) hours as needed for headache.    . allopurinol (ZYLOPRIM) 300 MG tablet Take 1 tablet (300 mg total) by mouth daily. 30 tablet 2  . amLODipine (NORVASC) 5 MG tablet Take 1 tablet (5 mg total) by mouth daily. (Patient taking differently: Take 5 mg by mouth daily as needed (blood pressure). ) 30 tablet 5  . apixaban (ELIQUIS) 5 MG TABS tablet Take 1 tablet (5 mg total) by mouth 2 (two) times daily. 60 tablet 3  . diltiazem (CARDIZEM CD) 180 MG 24 hr capsule Take 1 capsule (180 mg total) by mouth daily.  90 capsule 3  . furosemide (LASIX) 20 MG tablet TAKE 1 TABLET (20 MG TOTAL) BY MOUTH DAILY. 30 tablet 1  . HYDROcodone-acetaminophen (NORCO) 10-325 MG tablet Take 1 tablet by mouth every 6 (six) hours as needed. 120 tablet 0  . Loperamide HCl (IMODIUM PO) Take by mouth as needed. Reported on 05/10/2015    . metoprolol succinate (TOPROL-XL) 100 MG 24 hr tablet TAKE 1 TABLET BY MOUTH 2 TIMES DAILY. 60 tablet 5  . omeprazole (PRILOSEC) 40 MG capsule Take 1 capsule (40 mg total) by mouth daily. 30 capsule 5  . tiZANidine (ZANAFLEX) 4 MG tablet Take 1 tablet (4 mg total) by mouth every 8 (eight) hours as needed for muscle spasms. 30 tablet 0   No current facility-administered medications on file prior to visit.    Allergies  Allergen Reactions  . Coumadin [Warfarin]     GI bleeds    Family History  Problem Relation Age of Onset  . Colon cancer Neg Hx   . Heart disease Father 73    Deceased  . Heart attack Father 54  . Heart disease Brother 12    "Died in his sleep"  . Arthritis/Rheumatoid Mother     Deceased-86  . Dementia Paternal Grandmother   . Cancer Paternal Uncle   . Hyperlipidemia Brother   . Stroke Brother 41    Deceased  . Other Sister     Estate agent  in Home  . Other Sister     MVA  . Other Brother     Carjacked-killed  . Healthy Brother   . Healthy Son     X3  . Healthy Daughter     x1    Social History   Social History  . Marital Status: Married    Spouse Name: N/A  . Number of Children: 6  . Years of Education: N/A   Occupational History  . Retired     Estate manager/land agent   Social History Main Topics  . Smoking status: Never Smoker   . Smokeless tobacco: Never Used  . Alcohol Use: No  . Drug Use: No  . Sexual Activity: Not Asked   Other Topics Concern  . None   Social History Narrative   4 caffeine drinks daily    Review of Systems - See HPI.  All other ROS are negative.  BP 160/90 mmHg  Pulse 85  Temp(Src) 97.5 F (36.4 C) (Oral)  Ht 6' (1.829  m)  Wt 215 lb (97.523 kg)  BMI 29.15 kg/m2  SpO2 98%  Physical Exam  Constitutional: He is oriented to person, place, and time and well-developed, well-nourished, and in no distress.  HENT:  Head: Normocephalic and atraumatic.  Right Ear: External ear normal.  Left Ear: External ear normal.  Mouth/Throat: Oropharynx is clear and moist.  Eyes: Conjunctivae are normal.  Neck: Neck supple. No thyromegaly present.  Cardiovascular: Normal rate, regular rhythm, normal heart sounds and intact distal pulses.   Pulmonary/Chest: Effort normal and breath sounds normal. No respiratory distress. He has no wheezes. He has no rales. He exhibits no tenderness.  Neurological: He is alert and oriented to person, place, and time. No cranial nerve deficit. Gait normal. Coordination normal. GCS score is 15.  Skin: Skin is warm and dry. No rash noted.  Psychiatric: His mood appears not anxious. He exhibits a depressed mood. He expresses no homicidal and no suicidal ideation. He expresses no suicidal plans and no homicidal plans. He has a flat affect.  Vitals reviewed.   Recent Results (from the past 2160 hour(s))  I-Stat Troponin, ED (not at [facility])     Status: None   Collection Time: 06/16/15 11:33 AM  Result Value Ref Range   Troponin i, poc 0.00 0.00 - 0.08 ng/mL   Comment 3            Comment: Due to the release kinetics of cTnI, a negative result within the first hours of the onset of symptoms does not rule out myocardial infarction with certainty. If myocardial infarction is still suspected, repeat the test at appropriate intervals.   Basic metabolic panel     Status: Abnormal   Collection Time: 06/16/15 11:34 AM  Result Value Ref Range   Sodium 141 135 - 145 mmol/L   Potassium 3.9 3.5 - 5.1 mmol/L   Chloride 103 101 - 111 mmol/L   CO2 29 22 - 32 mmol/L   Glucose, Bld 95 65 - 99 mg/dL   BUN 10 6 - 20 mg/dL   Creatinine, Ser 1.19 0.61 - 1.24 mg/dL   Calcium 8.9 8.9 - 10.3 mg/dL   GFR calc  non Af Amer 58 (L) >60 mL/min   GFR calc Af Amer >60 >60 mL/min    Comment: (NOTE) The eGFR has been calculated using the CKD EPI equation. This calculation has not been validated in all clinical situations. eGFR's persistently <60 mL/min signify possible Chronic Kidney Disease.  Anion gap 9 5 - 15  CBC     Status: None   Collection Time: 06/16/15 11:34 AM  Result Value Ref Range   WBC 6.5 4.0 - 10.5 K/uL   RBC 5.71 4.22 - 5.81 MIL/uL   Hemoglobin 16.4 13.0 - 17.0 g/dL   HCT 49.5 39.0 - 52.0 %   MCV 86.7 78.0 - 100.0 fL   MCH 28.7 26.0 - 34.0 pg   MCHC 33.1 30.0 - 36.0 g/dL   RDW 13.4 11.5 - 15.5 %   Platelets 193 150 - 400 K/uL  TSH     Status: None   Collection Time: 06/16/15  4:48 PM  Result Value Ref Range   TSH 2.352 0.350 - 4.500 uIU/mL  Urinalysis, Routine w reflex microscopic (not at [facility])     Status: None   Collection Time: 06/16/15  5:28 PM  Result Value Ref Range   Color, Urine YELLOW YELLOW   APPearance CLEAR CLEAR   Specific Gravity, Urine 1.008 1.005 - 1.030   pH 6.5 5.0 - 8.0   Glucose, UA NEGATIVE NEGATIVE mg/dL   Hgb urine dipstick NEGATIVE NEGATIVE   Bilirubin Urine NEGATIVE NEGATIVE   Ketones, ur NEGATIVE NEGATIVE mg/dL   Protein, ur NEGATIVE NEGATIVE mg/dL   Nitrite NEGATIVE NEGATIVE   Leukocytes, UA NEGATIVE NEGATIVE    Comment: MICROSCOPIC NOT DONE ON URINES WITH NEGATIVE PROTEIN, BLOOD, LEUKOCYTES, NITRITE, OR GLUCOSE <1000 mg/dL.   Assessment/Plan: 1. Chronic fatigue Multifactorial -- age, depression, noncompliance with CPAP, etc. ER workup negative. SSRI has been started. Patient to continue chronic medications. He is to schedule FU with Dr. Cardiology. Referral placed back to Pulmonology for repeat OSA assessment and new CPAP supplies.  2. Depressed mood MMSE performed with perfect score. Lexapro 10 mg. He is to take as directed. Counseling recommended. Handout given. FU scheduled.  3. OSA (obstructive sleep apnea) Referral to Pulmonology  placed for repeat assessment and to order new CPAP machine/supplies. - Ambulatory referral to Pulmonology

## 2015-06-20 NOTE — Telephone Encounter (Signed)
Medication samples have been provided to the patient.  Drug name: Eliquis 5 mg Qty: 28 tabs LOT: [id number] Exp.Date: 09/2016  Samples left at front desk for patient pick-up. Patient notified.  Bakary Bramer, [place] 5:23 PM 06/20/2015

## 2015-06-20 NOTE — Patient Instructions (Signed)
Please start the Citalopram daily as directed. Continue BP medications, making sure to take amlodipine (Norvasc) as directed. Call to schedule follow-up with Dr. Percival Spanish.  I am setting you back up with Pulmonary for repeat sleep study as I feel sleep apnea is highly contributing to fatigue and BP  Follow-up in 3-4 weeks.

## 2015-06-20 NOTE — Progress Notes (Signed)
Pre visit review using our clinic review tool, if applicable. No additional management support is needed unless otherwise documented below in the visit note. 

## 2015-06-23 ENCOUNTER — Ambulatory Visit (INDEPENDENT_AMBULATORY_CARE_PROVIDER_SITE_OTHER): Payer: Commercial Managed Care - HMO | Admitting: Cardiology

## 2015-06-23 ENCOUNTER — Encounter: Payer: Self-pay | Admitting: Cardiology

## 2015-06-23 VITALS — BP 130/70 | HR 81 | Ht 72.0 in | Wt 214.0 lb

## 2015-06-23 DIAGNOSIS — I481 Persistent atrial fibrillation: Secondary | ICD-10-CM

## 2015-06-23 DIAGNOSIS — I4819 Other persistent atrial fibrillation: Secondary | ICD-10-CM

## 2015-06-23 MED ORDER — APIXABAN 5 MG PO TABS: 5.0000 mg | ORAL_TABLET | Freq: Two times a day (BID) | ORAL | Status: DC

## 2015-06-23 MED ORDER — METOPROLOL SUCCINATE ER 100 MG PO TB24: ORAL_TABLET | ORAL | Status: DC

## 2015-06-23 MED ORDER — DILTIAZEM HCL ER COATED BEADS 180 MG PO CP24: 180.0000 mg | ORAL_CAPSULE | Freq: Every day | ORAL | Status: DC

## 2015-06-23 NOTE — Patient Instructions (Signed)
Your physician wants you to follow-up in: 1 Year. You will receive a reminder letter in the mail two months in advance. If you don't receive a letter, please call our office to schedule the follow-up appointment.  

## 2015-06-23 NOTE — Progress Notes (Signed)
06/23/2015 [name] Woods   1940-05-13  [id number]  Primary Physician Leeanne Rio, PA-C   HPI:  The patient is a 75 year old male, who is followed for persistent atrial fibrillation.His history is also notable for mild nonobstructive coronary disease by [facility] 03/24/2012. Ejection fraction at that time was mildly reduced at 45%. However, repeat 2-D echocardiogram 07/02/2013 revealed improved systolic function with an estimated ejection fraction of 55%.  He recently had a pulmonary workup and was found to have some coronary calcium. However, he's had no new cardiovascular symptoms. He works still in Information systems manager. He can do this without shortness of breath, PND or orthopnea. He doesn't notice his fibrillation except perhaps rarely with palpitations. . He's had no presyncope or syncope and doesn't feel palpitations. He's had no bleeding issues.  He was in the ED recently.  I reviewed these records. His blood pressure was slightly elevated he was very fatigued. However, he's now has medications for anxiety which looked to me like muscle relaxants. He is also being referred for follow-up of his sleep apnea which hasn't been treated for years. He says his blood pressures come down.  Current Outpatient Prescriptions  Medication Sig Dispense Refill  . acetaminophen (TYLENOL) 500 MG tablet Take 1,000 mg by mouth every 6 (six) hours as needed for headache.    . allopurinol (ZYLOPRIM) 300 MG tablet Take 1 tablet (300 mg total) by mouth daily. 30 tablet 2  . amLODipine (NORVASC) 5 MG tablet Take 1 tablet (5 mg total) by mouth daily. (Patient taking differently: Take 5 mg by mouth daily as needed (blood pressure). ) 30 tablet 5  . apixaban (ELIQUIS) 5 MG TABS tablet Take 1 tablet (5 mg total) by mouth 2 (two) times daily. 60 tablet 3  . diltiazem (CARDIZEM CD) 180 MG 24 hr capsule Take 1 capsule (180 mg total) by mouth daily. 90 capsule 3  . escitalopram (LEXAPRO) 10 MG tablet Take 1  tablet (10 mg total) by mouth daily. 30 tablet 3  . furosemide (LASIX) 20 MG tablet TAKE 1 TABLET (20 MG TOTAL) BY MOUTH DAILY. 30 tablet 1  . HYDROcodone-acetaminophen (NORCO) 10-325 MG tablet Take 1 tablet by mouth every 6 (six) hours as needed. 120 tablet 0  . Loperamide HCl (IMODIUM PO) Take by mouth as needed. Reported on 05/10/2015    . metoprolol succinate (TOPROL-XL) 100 MG 24 hr tablet TAKE 1 TABLET BY MOUTH 2 TIMES DAILY. 60 tablet 5  . omeprazole (PRILOSEC) 40 MG capsule Take 1 capsule (40 mg total) by mouth daily. 30 capsule 5  . tiZANidine (ZANAFLEX) 4 MG tablet Take 1 tablet (4 mg total) by mouth every 8 (eight) hours as needed for muscle spasms. 30 tablet 0   No current facility-administered medications for this visit.    Allergies  Allergen Reactions  . Coumadin [Warfarin]     GI bleeds   Past Medical History  Diagnosis Date  . Ventricular hypertrophy   . Persistent atrial fibrillation ([place])   . Hiatal hernia   . 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 Cholelithiasis   . Sleep apnea   . Nephrolithiasis   . Arthritis   . Hypertension   . Ankylosing spondylitis ([place])   . GERD (gastroesophageal reflux disease)   . CAD (coronary artery disease) [id number]    30% mid LAD lesion on cardiac cath  . Chicken pox   . [place] measles    Past Surgical History  Procedure Laterality Date  .  Cholecystectomy    . Cardiac catheterization  03/2012    30% mid LAD lesion otherwise normal cors, LVEF 65%  . Tonsillectomy    . Left and right heart catheterization with coronary angiogram N/A 03/24/2012    Procedure: LEFT AND RIGHT HEART CATHETERIZATION WITH CORONARY ANGIOGRAM;  Surgeon: Sherren Mocha, MD;  Location: [facility] CATH LAB;  Service: Cardiovascular;  Laterality: N/A;  . Elbow arthroplasty      Left  . Leg fluid removal right       ROS:  As stated in the HPI and negative for all other systems.  EXAM: Blood pressure 130/70, pulse 81, height 6' (1.829 m), weight 214 lb (97.07 kg).   GENERAL:  Well appearing HEENT:  Pupils equal round and reactive, fundi not visualized, oral mucosa unremarkable, poor dentition NECK:  No jugular venous distention, waveform within normal limits, carotid upstroke brisk and symmetric, no bruits, no thyromegaly LUNGS:  Clear to auscultation bilaterally CHEST:  Unremarkable HEART:  PMI not displaced or sustained,S1 and S2 within normal limits, no S3, no S4, no clicks, no rubs, no murmurs ABD:  Flat, positive bowel sounds normal in frequency in pitch, no bruits, no rebound, no guarding, no midline pulsatile mass, no hepatomegaly, no splenomegaly EXT:  2 plus pulses throughout, no edema, no cyanosis no clubbing   ASSESSMENT AND PLAN:   CORONARY CALCIUM :  The patient has no recent or change symptoms since his catheterization. He did have nonobstructive plaque at that time. No further stress testing is indicated. He should continue with risk reduction.     ATRIAL FIB:  The patient  tolerates this rhythm and rate control and anticoagulation. We will continue with the meds as listed.   I did review the labs from the emergency room and his hemoglobin is normal as is his renal function.   Akhir HochreinPA-C 06/23/2015 2:36 PM

## 2015-06-24 MED FILL — HYDROCODON-APAP 10-325: 10-325 | 30 days supply | Qty: 120 | Fill #0

## 2015-06-30 DIAGNOSIS — D3615 Benign neoplasm of peripheral nerves and autonomic nervous system of abdomen: Secondary | ICD-10-CM | POA: Diagnosis not present

## 2015-06-30 DIAGNOSIS — L918 Other hypertrophic disorders of the skin: Secondary | ICD-10-CM | POA: Diagnosis not present

## 2015-06-30 DIAGNOSIS — D225 Melanocytic nevi of trunk: Secondary | ICD-10-CM | POA: Diagnosis not present

## 2015-06-30 DIAGNOSIS — D2262 Melanocytic nevi of left upper limb, including shoulder: Secondary | ICD-10-CM | POA: Diagnosis not present

## 2015-06-30 DIAGNOSIS — L821 Other seborrheic keratosis: Secondary | ICD-10-CM | POA: Diagnosis not present

## 2015-06-30 DIAGNOSIS — L720 Epidermal cyst: Secondary | ICD-10-CM | POA: Diagnosis not present

## 2015-06-30 DIAGNOSIS — L814 Other melanin hyperpigmentation: Secondary | ICD-10-CM | POA: Diagnosis not present

## 2015-07-07 MED FILL — FUROSEMIDE 20 MG TABLET: 20 | 30 days supply | Qty: 30 | Fill #0

## 2015-07-13 MED FILL — AMLODIPINE BESYLATE 5 MG TA: 5 | 30 days supply | Qty: 30 | Fill #0

## 2015-07-18 ENCOUNTER — Ambulatory Visit: Payer: Commercial Managed Care - HMO | Admitting: Physician Assistant

## 2015-07-22 ENCOUNTER — Telehealth: Payer: Self-pay | Admitting: Cardiology

## 2015-07-22 NOTE — Telephone Encounter (Signed)
New message   Patient calling the office for samples of medication:   1.  What medication and dosage are you requesting samples for? Eliquis 5mg   2.  Are you currently out of this medication? no

## 2015-07-25 ENCOUNTER — Other Ambulatory Visit: Payer: Self-pay | Admitting: Physician Assistant

## 2015-07-25 MED ORDER — APIXABAN 5 MG PO TABS: 5.0000 mg | ORAL_TABLET | Freq: Two times a day (BID) | ORAL | Status: DC

## 2015-07-25 NOTE — Telephone Encounter (Signed)
samples available 2 weeks. Patient states ,he cut pills in half over the weekend,to last. RN asked if a prescription can be called for patient.- we may not have samples as frequent. Patient states he would like a written prescription  both available for pick up.

## 2015-07-25 NOTE — Telephone Encounter (Signed)
°  Relationship to patient: Self  Can be reached: [phone]   Reason for call: Refill HYDROcodone-acetaminophen (NORCO) 10-325 MG tablet [id number]

## 2015-07-26 MED ORDER — HYDROCODONE-ACETAMINOPHEN 10-325 MG PO TABS: 1.0000 | ORAL_TABLET | Freq: Four times a day (QID) | ORAL | Status: DC | PRN

## 2015-07-26 MED FILL — HYDROCODON-APAP 10-325: 10-325 | 30 days supply | Qty: 120 | Fill #0

## 2015-07-26 NOTE — Telephone Encounter (Signed)
Refill request for Hydrocodone-APAP 10-325mg  Last filled by MD on - 06/20/15 Last AEX - 06/20/15 Next AEX - 4-Wks. [Will send message to schedulers to contact patient] Rx pending signature/SLS 06/27

## 2015-07-26 NOTE — Telephone Encounter (Signed)
Rx placed at front desk for pick up and pt has been notified. 

## 2015-07-26 NOTE — Telephone Encounter (Signed)
See rx. 

## 2015-07-27 ENCOUNTER — Encounter: Payer: Self-pay | Admitting: Physician Assistant

## 2015-08-03 ENCOUNTER — Telehealth: Payer: Self-pay | Admitting: Physician Assistant

## 2015-08-03 NOTE — Telephone Encounter (Signed)
He needs appointment.

## 2015-08-03 NOTE — Telephone Encounter (Signed)
°  Relationship to patient: Self  Can be reached: [phone]   Pharmacy:  [facility], [place] [phone] (Phone) [phone] (Fax)        Reason for call: Request a Rx for Prednisone. States he is having a flare of Gout.

## 2015-08-03 NOTE — Telephone Encounter (Signed)
Called patient to inform him of below and patient stated he know what is going on and what he needs. Did not want to schedule appointment

## 2015-08-05 MED FILL — METOPROLOL SUCC ER 100 MG T: 100 | 30 days supply | Qty: 60 | Fill #0

## 2015-08-09 ENCOUNTER — Telehealth: Payer: Self-pay | Admitting: *Deleted

## 2015-08-09 MED ORDER — FUROSEMIDE 20 MG PO TABS: ORAL_TABLET | ORAL | Status: DC

## 2015-08-09 NOTE — Telephone Encounter (Signed)
Rx request to pharmacy/SLS 07/11 Requested drug refills are authorized, however, the patient needs further evaluation and/or laboratory testing before further refills are given. Ask him to make an appointment for this.  Patient refuses to make appointment:  [name] Woods at 08/03/2015 3:30 PM     Status: Signed       Expand All Collapse All   Called patient to inform him of below and patient stated he know what is going on and what he needs. Did not want to schedule appointment        Brunetta Jeans, PA-C at 08/03/2015 1:11 PM     Status: Signed       Expand All Collapse All   He needs appointment.        [name] Woods at 08/03/2015 11:47 AM     Status: Signed       Expand All Collapse All    Relationship to patient: Self  Can be reached: [phone]   Pharmacy:    Reason for call: Request a Rx for Prednisone. States he is having a flare of Gout.

## 2015-08-10 MED FILL — FUROSEMIDE 20 MG TABLET: 20 | 30 days supply | Qty: 30 | Fill #0

## 2015-08-11 ENCOUNTER — Telehealth: Payer: Self-pay | Admitting: Cardiology

## 2015-08-11 ENCOUNTER — Telehealth: Payer: Self-pay | Admitting: *Deleted

## 2015-08-11 NOTE — Telephone Encounter (Signed)
Advised patient no samples at this time 

## 2015-08-11 NOTE — Telephone Encounter (Signed)
Called patient to let him know that there were two boxes of Eliquis samples upfront for him to pick up. He confirmed he would pick them up and said thank you.

## 2015-08-11 NOTE — Telephone Encounter (Signed)
New message       Patient calling the office for samples of medication:   1.  What medication and dosage are you requesting samples for?  eliquis 5mg   2.  Are you currently out of this medication? Almost out [facility] office did not have samples.  They told pt to call [address] to see if we had samples

## 2015-08-11 NOTE — Telephone Encounter (Signed)
New message       Patient calling the office for samples of medication:   1.  What medication and dosage are you requesting samples for? eliquis 5mg   2.  Are you currently out of this medication? no

## 2015-08-24 ENCOUNTER — Telehealth: Payer: Self-pay | Admitting: Physician Assistant

## 2015-08-24 ENCOUNTER — Telehealth: Payer: Self-pay | Admitting: *Deleted

## 2015-08-24 ENCOUNTER — Telehealth: Payer: Self-pay | Admitting: Cardiology

## 2015-08-24 MED ORDER — HYDROCODONE-ACETAMINOPHEN 10-325 MG PO TABS: 1.0000 | ORAL_TABLET | Freq: Four times a day (QID) | ORAL | 0 refills | Status: DC | PRN

## 2015-08-24 MED ORDER — APIXABAN 5 MG PO TABS: 5.0000 mg | ORAL_TABLET | Freq: Two times a day (BID) | ORAL | 3 refills | Status: DC

## 2015-08-24 NOTE — Telephone Encounter (Addendum)
Spoke with patient- advised pt we are out of eliquis samples at this time. Pt reports he runs out on Friday and will not be able to refill his prescription due to cost.  Patient has used 30 day supply card before.  [facility] location to see if they have any samples available.    [address] has samples available-pt notified to pick samples up at [facility] location.    Offered to begin Pt assistance program for pt, pt agreeable.

## 2015-08-24 NOTE — Telephone Encounter (Signed)
Refill printed. Please contact patient for pickup.

## 2015-08-24 NOTE — Telephone Encounter (Signed)
Patient informed, understood & agreed to p/u during regular business hrs and also, to schedule F/U appointmetn that his is overdue for [canceled in June]/SLS 07/26

## 2015-08-24 NOTE — Telephone Encounter (Signed)
Refill request for Hydrocodone-APAP Last filled by MD on - 07/26/15, #120x0 Last AEX - 06/20/15 Patient Canceled 07/18/15 F/U appointment: Next AEX - Instructions After Visit Summary (Printed 06/20/2015 Please start the Citalopram daily as directed. Continue BP medications, making sure to take amlodipine (Norvasc) as directed. Call to schedule follow-up with Dr. Percival Spanish.  I am setting you back up with Pulmonary for repeat sleep study as I feel sleep apnea is highly contributing to fatigue and BP  Follow-up in 3-4 weeks.   Please Advise on refills/SLS 07/26

## 2015-08-24 NOTE — Telephone Encounter (Signed)
New message       Patient calling the office for samples of medication:   1.  What medication and dosage are you requesting samples for?Eliquis 5 mg po   2.  Are you currently out of this medication? Pt will be out on Friday

## 2015-08-24 NOTE — Telephone Encounter (Signed)
Pt is requesting a refill for HYDROcodone

## 2015-08-24 NOTE — Telephone Encounter (Signed)
Prescription for Eliqius waiting for Dr Percival Spanish to sign

## 2015-08-26 MED FILL — HYDROCODON-APAP 10-325: 10-325 | 30 days supply | Qty: 120 | Fill #0

## 2015-08-30 MED FILL — CARTIA XT 180 MG CAPSULE SA: 180 | 90 days supply | Qty: 90 | Fill #0

## 2015-08-31 ENCOUNTER — Other Ambulatory Visit: Payer: Commercial Managed Care - HMO

## 2015-09-07 ENCOUNTER — Ambulatory Visit (INDEPENDENT_AMBULATORY_CARE_PROVIDER_SITE_OTHER)
Admission: RE | Admit: 2015-09-07 | Discharge: 2015-09-07 | Disposition: A | Discharge status: Home / Self Care | Payer: Commercial Managed Care - HMO | Source: Ambulatory Visit | Attending: Internal Medicine | Admitting: Internal Medicine

## 2015-09-07 DIAGNOSIS — R918 Other nonspecific abnormal finding of lung field: Secondary | ICD-10-CM | POA: Diagnosis not present

## 2015-09-08 ENCOUNTER — Telehealth: Payer: Self-pay | Admitting: Physician Assistant

## 2015-09-08 MED ORDER — FUROSEMIDE 20 MG PO TABS: ORAL_TABLET | ORAL | 0 refills | Status: DC

## 2015-09-08 MED FILL — FUROSEMIDE 20 MG TABLET: 20 | 30 days supply | Qty: 30 | Fill #0

## 2015-09-08 NOTE — Telephone Encounter (Signed)
Please advise 

## 2015-09-08 NOTE — Telephone Encounter (Signed)
Can give 30 day supply. No further refills until he has follow-up.

## 2015-09-08 NOTE — Telephone Encounter (Signed)
Medication filled to pharmacy as requested.   

## 2015-09-08 NOTE — Telephone Encounter (Signed)
Refill request for medications. Pt scheduled F/U for Monday 09/12/15 at 2:15p.   1. Furosemide    Pharmacy: [place], [place] - [address] [facility].

## 2015-09-12 ENCOUNTER — Telehealth: Payer: Self-pay | Admitting: Internal Medicine

## 2015-09-12 ENCOUNTER — Ambulatory Visit: Payer: Commercial Managed Care - HMO | Admitting: Physician Assistant

## 2015-09-12 ENCOUNTER — Encounter: Payer: Self-pay | Admitting: Physician Assistant

## 2015-09-12 ENCOUNTER — Ambulatory Visit (INDEPENDENT_AMBULATORY_CARE_PROVIDER_SITE_OTHER): Payer: Commercial Managed Care - HMO | Admitting: Physician Assistant

## 2015-09-12 VITALS — BP 124/63 | HR 62 | Temp 97.8°F | Resp 16 | Ht 72.0 in | Wt 216.4 lb

## 2015-09-12 DIAGNOSIS — F418 Other specified anxiety disorders: Secondary | ICD-10-CM

## 2015-09-12 DIAGNOSIS — F419 Anxiety disorder, unspecified: Secondary | ICD-10-CM | POA: Insufficient documentation

## 2015-09-12 DIAGNOSIS — F32A Depression, unspecified: Secondary | ICD-10-CM

## 2015-09-12 DIAGNOSIS — M10079 Idiopathic gout, unspecified ankle and foot: Secondary | ICD-10-CM

## 2015-09-12 DIAGNOSIS — M109 Gout, unspecified: Secondary | ICD-10-CM

## 2015-09-12 DIAGNOSIS — F329 Major depressive disorder, single episode, unspecified: Secondary | ICD-10-CM | POA: Insufficient documentation

## 2015-09-12 HISTORY — DX: Depression, unspecified: F32.A

## 2015-09-12 HISTORY — DX: Anxiety disorder, unspecified: F41.9

## 2015-09-12 LAB — BASIC METABOLIC PANEL
BUN: 15 mg/dL (ref 6–23)
CALCIUM: 9.1 mg/dL (ref 8.4–10.5)
CHLORIDE: 101 meq/L (ref 96–112)
CO2: 31 meq/L (ref 19–32)
CREATININE: 1.31 mg/dL (ref 0.40–1.50)
GFR: 56.76 mL/min — ABNORMAL LOW (ref >60.00)
GLUCOSE: 106 mg/dL — AB (ref 70–99)
Potassium: 3.2 mEq/L — ABNORMAL LOW (ref 3.5–5.1)
Sodium: 142 mEq/L (ref 135–145)

## 2015-09-12 LAB — URIC ACID: Uric Acid, Serum: 10 mg/dL — ABNORMAL HIGH (ref 4.0–7.8)

## 2015-09-12 MED ORDER — PREDNISONE 10 MG PO TABS: 20.0000 mg | ORAL_TABLET | Freq: Every day | ORAL | 0 refills | Status: DC

## 2015-09-12 NOTE — Progress Notes (Signed)
Patient presents to clinic today for medication management.  Patient with history of gout, previously uncontrolled with frequent exacerbations. Patient was restarted on allopurinol and titrated to 300 mg daily. Is taking as directed. Endorses some flares, but much less frequent. Endorses a diet that is high in shellfish, tomatoes other purine rich foods. He is aware that these are trigger foods, but states he cannot help but have these foods. Patient is requesting a prescription on file for prednisone to take for flareups of symptoms.  Patient also following up on anxiety and depression. Is currently on Lexapro 10 mg daily. Endorses taking as directed. Patient endorses great mood with this medication. Is sleeping well. Denies breakthrough anxiety or depressed mood. Denies suicidal thought or ideation.  Past Medical History:  Diagnosis Date  . Ankylosing spondylitis ([place])   . Arthritis   . CAD (coronary artery disease) 8502,7741   30% mid LAD lesion on cardiac cath  . Chicken pox   . Cholelithiasis   . GERD (gastroesophageal reflux disease)   . [place] measles   . Hiatal hernia   . Hypertension   . Nephrolithiasis   . Persistent atrial fibrillation ([place])   . Sleep apnea   . 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 Ventricular hypertrophy     Current Outpatient Prescriptions on File Prior to Visit  Medication Sig Dispense Refill  . acetaminophen (TYLENOL) 500 MG tablet Take 1,000 mg by mouth every 6 (six) hours as needed for headache.    . allopurinol (ZYLOPRIM) 300 MG tablet Take 1 tablet (300 mg total) by mouth daily. 30 tablet 2  . amLODipine (NORVASC) 5 MG tablet Take 1 tablet (5 mg total) by mouth daily. (Patient taking differently: Take 5 mg by mouth daily as needed (blood pressure). ) 30 tablet 5  . apixaban (ELIQUIS) 5 MG TABS tablet Take 1 tablet (5 mg total) by mouth 2 (two) times daily. 180 tablet 3  . diltiazem (CARDIZEM CD) 180 MG 24 hr capsule Take 1 capsule (180 mg total)  by mouth daily. 90 capsule 3  . escitalopram (LEXAPRO) 10 MG tablet Take 1 tablet (10 mg total) by mouth daily. 30 tablet 3  . furosemide (LASIX) 20 MG tablet TAKE 1 TABLET (20 MG TOTAL) BY MOUTH DAILY. 30 tablet 0  . HYDROcodone-acetaminophen (NORCO) 10-325 MG tablet Take 1 tablet by mouth every 6 (six) hours as needed. 120 tablet 0  . Loperamide HCl (IMODIUM PO) Take by mouth as needed. Reported on 05/10/2015    . metoprolol succinate (TOPROL-XL) 100 MG 24 hr tablet TAKE 1 TABLET BY MOUTH 2 TIMES DAILY. 180 tablet 3  . omeprazole (PRILOSEC) 40 MG capsule Take 1 capsule (40 mg total) by mouth daily. 30 capsule 5  . tiZANidine (ZANAFLEX) 4 MG tablet Take 1 tablet (4 mg total) by mouth every 8 (eight) hours as needed for muscle spasms. 30 tablet 0   No current facility-administered medications on file prior to visit.     Allergies  Allergen Reactions  . Coumadin [Warfarin]     GI bleeds    Family History  Problem Relation Age of Onset  . Colon cancer Neg Hx   . Heart disease Father 71    Deceased  . Heart attack Father 17  . Heart disease Brother 39    "Died in his sleep"  . Arthritis/Rheumatoid Mother     Deceased-86  . Dementia Paternal Grandmother   . Cancer Paternal Uncle   . Hyperlipidemia Brother   .  Stroke Brother 65    Deceased  . Other Sister     Estate agent in [place]  . Other Sister     MVA  . Other Brother     Carjacked-killed  . Healthy Brother   . Healthy Son     X3  . Healthy Daughter     x1    Social History   Social History  . Marital status: Married    Spouse name: N/A  . Number of children: 6  . Years of education: N/A   Occupational History  . Retired Retired    Estate manager/land agent   Social History Main Topics  . Smoking status: Never Smoker  . Smokeless tobacco: Never Used  . Alcohol use No  . Drug use: No  . Sexual activity: Not Asked   Other Topics Concern  . None   Social History Narrative   4 caffeine drinks daily     Review of Systems  - See HPI.  All other ROS are negative.  BP 124/63 (BP Location: Left Arm, Patient Position: Sitting, Cuff Size: Large)   Pulse 62   Temp 97.8 F (36.6 C) (Oral)   Resp 16   Ht 6' (1.829 m)   Wt 216 lb 6 oz (98.1 kg)   SpO2 98%   BMI 29.35 kg/m   Physical Exam  Constitutional: He is oriented to person, place, and time and well-developed, well-nourished, and in no distress.  HENT:  Head: Normocephalic and atraumatic.  Eyes: Conjunctivae are normal.  Cardiovascular: Normal rate, regular rhythm, normal heart sounds and intact distal pulses.   Pulmonary/Chest: Effort normal and breath sounds normal. No respiratory distress. He has no wheezes. He has no rales. He exhibits no tenderness.  Neurological: He is alert and oriented to person, place, and time.  Skin: Skin is warm and dry. No rash noted.  Psychiatric: Affect normal.  Vitals reviewed.   Recent Results (from the past 2160 hour(s))  I-Stat Troponin, ED (not at [facility])     Status: None   Collection Time: 06/16/15 11:33 AM  Result Value Ref Range   Troponin i, poc 0.00 0.00 - 0.08 ng/mL   Comment 3            Comment: Due to the release kinetics of cTnI, a negative result within the first hours of the onset of symptoms does not rule out myocardial infarction with certainty. If myocardial infarction is still suspected, repeat the test at appropriate intervals.   Basic metabolic panel     Status: Abnormal   Collection Time: 06/16/15 11:34 AM  Result Value Ref Range   Sodium 141 135 - 145 mmol/L   Potassium 3.9 3.5 - 5.1 mmol/L   Chloride 103 101 - 111 mmol/L   CO2 29 22 - 32 mmol/L   Glucose, Bld 95 65 - 99 mg/dL   BUN 10 6 - 20 mg/dL   Creatinine, Ser 1.19 0.61 - 1.24 mg/dL   Calcium 8.9 8.9 - 10.3 mg/dL   GFR calc non Af Amer 58 (L) >60 mL/min   GFR calc Af Amer >60 >60 mL/min    Comment: (NOTE) The eGFR has been calculated using the CKD EPI equation. This calculation has not been validated in all clinical  situations. eGFR's persistently <60 mL/min signify possible Chronic Kidney Disease.    Anion gap 9 5 - 15  CBC     Status: None   Collection Time: 06/16/15 11:34 AM  Result Value Ref Range  WBC 6.5 4.0 - 10.5 K/uL   RBC 5.71 4.22 - 5.81 MIL/uL   Hemoglobin 16.4 13.0 - 17.0 g/dL   HCT 49.5 39.0 - 52.0 %   MCV 86.7 78.0 - 100.0 fL   MCH 28.7 26.0 - 34.0 pg   MCHC 33.1 30.0 - 36.0 g/dL   RDW 13.4 11.5 - 15.5 %   Platelets 193 150 - 400 K/uL  TSH     Status: None   Collection Time: 06/16/15  4:48 PM  Result Value Ref Range   TSH 2.352 0.350 - 4.500 uIU/mL  Urinalysis, Routine w reflex microscopic (not at [facility])     Status: None   Collection Time: 06/16/15  5:28 PM  Result Value Ref Range   Color, Urine YELLOW YELLOW   APPearance CLEAR CLEAR   Specific Gravity, Urine 1.008 1.005 - 1.030   pH 6.5 5.0 - 8.0   Glucose, UA NEGATIVE NEGATIVE mg/dL   Hgb urine dipstick NEGATIVE NEGATIVE   Bilirubin Urine NEGATIVE NEGATIVE   Ketones, ur NEGATIVE NEGATIVE mg/dL   Protein, ur NEGATIVE NEGATIVE mg/dL   Nitrite NEGATIVE NEGATIVE   Leukocytes, UA NEGATIVE NEGATIVE    Comment: MICROSCOPIC NOT DONE ON URINES WITH NEGATIVE PROTEIN, BLOOD, LEUKOCYTES, NITRITE, OR GLUCOSE <1000 mg/dL.    Assessment/Plan: Anxiety and depression Doing very well. We'll continue current regimen. Follow-up in 6 months.  Gout Chronic with flares. Patient is taking allopurinol 300 mg daily as directed. Is aware that he is eating a very poor diet when it comes to triggering gout flares. Is not willing to cut back on his foods. Discussed with him that we will check uric acid level today. May need Additional Agents. I Will Write a Prescription to Keep on File for Prednisone for Flare up. Discussed with Him That I Will Not Do This Often and he will have to work on diet.     Leeanne Rio, PA-C

## 2015-09-12 NOTE — Telephone Encounter (Signed)
Ct chest 09/07/14 - no change in 78mm nodules in 1 year; so considerede benign. Small renal stone seen - he needs to talk to pcp Leeanne Rio, PA-C about this. Send result to PCP  Cough - need 1 year fu appt; pls give him  Dr. Brand Males, M.D., [facility].C.P Pulmonary and Critical Care Medicine Staff Physician [facility] Pager: [phone], If no answer or between  15:00h - 7:00h: call [phone]  09/12/2015 5:44 PM

## 2015-09-12 NOTE — Assessment & Plan Note (Signed)
Doing very well. We'll continue current regimen. Follow-up in 6 months.

## 2015-09-12 NOTE — Patient Instructions (Signed)
Please go to the lab for blood work. I will call you with the results. We will alter your regimen based on those results. Please read the guide the low on avoiding trigger foods that cause gout. I know you enjoy these foods, but eating them often is putting you at risk for repeat gout flares.   Gout Gout is when your joints become red, sore, and swell (inflamed). This is caused by the buildup of uric acid crystals in the joints. Uric acid is a chemical that is normally in the blood. If the level of uric acid gets too high in the blood, these crystals form in your joints and tissues. Over time, these crystals can form into masses near the joints and tissues. These masses can destroy bone and cause the bone to look misshapen (deformed). HOME CARE   Do not take aspirin for pain.  Only take medicine as told by your doctor.  Rest the joint as much as you can. When in bed, keep sheets and blankets off painful areas.  Keep the sore joints raised (elevated).  Put warm or cold packs on painful joints. Use of warm or cold packs depends on which works best for you.  Use crutches if the painful joint is in your leg.  Drink enough fluids to keep your pee (urine) clear or pale yellow. Limit alcohol, sugary drinks, and drinks with fructose in them.  Follow your diet instructions. Pay careful attention to how much protein you eat. Include fruits, vegetables, whole grains, and fat-free or low-fat milk products in your daily diet. Talk to your doctor or dietitian about the use of coffee, vitamin C, and cherries. These may help lower uric acid levels.  Keep a healthy body weight. GET HELP RIGHT AWAY IF:   You have watery poop (diarrhea), throw up (vomit), or have any side effects from medicines.  You do not feel better in 24 hours, or you are getting worse.  Your joint becomes suddenly more tender, and you have chills or a fever. MAKE SURE YOU:   Understand these instructions.  Will watch your  condition.  Will get help right away if you are not doing well or get worse.   This information is not intended to replace advice given to you by your health care provider. Make sure you discuss any questions you have with your health care provider.   Document Released: 10/25/2007 Document Revised: 02/05/2014 Document Reviewed: 08/29/2011 Elsevier Interactive Patient Education [employer].

## 2015-09-12 NOTE — Assessment & Plan Note (Signed)
Chronic with flares. Patient is taking allopurinol 300 mg daily as directed. Is aware that he is eating a very poor diet when it comes to triggering gout flares. Is not willing to cut back on his foods. Discussed with him that we will check uric acid level today. May need Additional Agents. I Will Write a Prescription to Keep on File for Prednisone for Flare up. Discussed with Him That I Will Not Do This Often and he will have to work on diet.

## 2015-09-13 ENCOUNTER — Other Ambulatory Visit: Payer: Self-pay | Admitting: *Deleted

## 2015-09-13 DIAGNOSIS — E876 Hypokalemia: Secondary | ICD-10-CM

## 2015-09-13 DIAGNOSIS — M1A00X0 Idiopathic chronic gout, unspecified site, without tophus (tophi): Secondary | ICD-10-CM

## 2015-09-13 NOTE — Telephone Encounter (Signed)
Attempt to reach pt via phone, message states "the person you are trying to reach cannot accept your call at this time, please try your call again later"/SLS 08/15 Future lab order [BMET], new Rx, and Referral to Rheumatology pending.

## 2015-09-13 NOTE — Telephone Encounter (Signed)
-----   Message from Brunetta Jeans, PA-C sent at 09/13/2015 12:54 PM EDT ----- Uric acid level remains high despite increase in allopurinol which he notes taking daily. His lifestyle is a huge contributor -- intake of shellfish and other purine-containing foods. Since he is not willing to make lifestyle changes we need additional medicine to get this under control. I would like to refer him to Rheumatology for further management giving non-responsiveness to standard dose of Allopurinol. Ok to place referral if he is willing.   Potassium was mildly decreased. Want him to start a Klor-Con supplement 20 mEq daily. Ok to send in Rx. Repeat BMP (lab appt only) in 3-4 weeks.

## 2015-09-14 NOTE — Telephone Encounter (Signed)
Called and spoke to pt. Informed him of the results and recs per MR. CT chest sent to PCP and 12 month recall placed for MR. Pt verbalized understanding and denied any further questions or concerns at this time.

## 2015-09-19 MED ORDER — POTASSIUM CHLORIDE CRYS ER 20 MEQ PO TBCR: 20.0000 meq | EXTENDED_RELEASE_TABLET | Freq: Every day | ORAL | 3 refills | Status: DC

## 2015-09-19 NOTE — Telephone Encounter (Signed)
Patient informed, understood & agreed; all pending orders completed/SLS 08/21

## 2015-09-20 ENCOUNTER — Telehealth: Payer: Self-pay | Admitting: Cardiology

## 2015-09-20 NOTE — Telephone Encounter (Signed)
New Message  Patient calling the office for samples of medication:   1.  What medication and dosage are you requesting samples for? Eliquis 5mg   2.  Are you currently out of this medication? Per pt yes

## 2015-09-20 NOTE — Telephone Encounter (Signed)
Medication samples have been provided to the patient.  Drug name: Eliquis 5mg   Qty: 56 (28 days) LOT: [id number]  Exp.Date: 9/19  Samples left at front desk for patient pick-up. Patient notified.  Silverio Lay 9:32 AM 09/20/2015

## 2015-09-26 ENCOUNTER — Telehealth: Payer: Self-pay | Admitting: Physician Assistant

## 2015-09-26 ENCOUNTER — Institutional Professional Consult (permissible substitution): Payer: Commercial Managed Care - HMO | Admitting: Pulmonary Disease

## 2015-09-26 MED ORDER — HYDROCODONE-ACETAMINOPHEN 10-325 MG PO TABS: 1.0000 | ORAL_TABLET | Freq: Four times a day (QID) | ORAL | 0 refills | Status: DC | PRN

## 2015-09-26 MED FILL — METOPROLOL SUCC ER 100 MG T: 100 | 30 days supply | Qty: 60 | Fill #1

## 2015-09-26 NOTE — Telephone Encounter (Signed)
°  Relationship to patient: Self  Can be reached: [phone]    Reason for call: Request refill on HYDROcodone-acetaminophen (NORCO) 10-325 MG tablet [id number]

## 2015-09-26 NOTE — Telephone Encounter (Signed)
Refill ready for pick up 

## 2015-09-27 NOTE — Telephone Encounter (Signed)
Pt made aware that Rx is ready for pick up.

## 2015-09-28 MED FILL — predniSONE 10 MG TABS: 10 | 3 days supply | Qty: 6 | Fill #0

## 2015-09-28 MED FILL — POTASSIUM CL ER 20 MEQ TABL: 20 | 30 days supply | Qty: 30 | Fill #0

## 2015-09-28 MED FILL — HYDROCODON-APAP 10-325: 10-325 | 30 days supply | Qty: 120 | Fill #0

## 2015-10-12 ENCOUNTER — Other Ambulatory Visit: Payer: Self-pay | Admitting: Physician Assistant

## 2015-10-12 MED FILL — FUROSEMIDE 20 MG TABLET: 20 | 30 days supply | Qty: 30 | Fill #0

## 2015-10-12 NOTE — Telephone Encounter (Signed)
Rx request to pharmacy/SLS  

## 2015-10-19 ENCOUNTER — Other Ambulatory Visit: Payer: Self-pay | Admitting: Cardiology

## 2015-10-19 NOTE — Telephone Encounter (Signed)
Called unable to leave message - voice mail has not been set up  samples let at front desk

## 2015-10-19 NOTE — Telephone Encounter (Signed)
New message    Patient calling the office for samples of medication:   1.  What medication and dosage are you requesting samples for? Eliquis 5 mg twice a day  2.  Are you currently out of this medication? One more dosage

## 2015-10-21 MED ORDER — APIXABAN 5 MG PO TABS: 5.0000 mg | ORAL_TABLET | Freq: Two times a day (BID) | ORAL | 0 refills | Status: DC

## 2015-10-21 NOTE — Telephone Encounter (Signed)
Spoke with pt, he has picked up his samples

## 2015-10-21 NOTE — Telephone Encounter (Signed)
Unable to reach pt or leave a message  

## 2015-10-25 ENCOUNTER — Telehealth: Payer: Self-pay | Admitting: Physician Assistant

## 2015-10-25 MED ORDER — HYDROCODONE-ACETAMINOPHEN 10-325 MG PO TABS: 1.0000 | ORAL_TABLET | Freq: Four times a day (QID) | ORAL | 0 refills | Status: DC | PRN

## 2015-10-25 NOTE — Telephone Encounter (Signed)
Patient informed, understood & agreed/SLS 09/26

## 2015-10-25 NOTE — Telephone Encounter (Signed)
Patient needs refill of HYDROcodone-acetaminophen (NORCO) 10-325 MG tablet  Patient Relation: Self Patient [id number]

## 2015-10-28 MED FILL — HYDROCODON-APAP 10-325: 10-325 | 30 days supply | Qty: 120 | Fill #0

## 2015-11-04 ENCOUNTER — Telehealth: Payer: Self-pay | Admitting: Cardiology

## 2015-11-04 NOTE — Telephone Encounter (Signed)
No samples in sample coset S/w pt informed that there are no samples, informed pt to call and see if he qualifies for pt assistance program, he states that he does not want to do that right now, informed pt that this medication should not be stopped and pt states that he is aware of this.

## 2015-11-04 NOTE — Telephone Encounter (Signed)
°  Follow up   Patient calling the office for samples of medication:   1. What medication and dosage are you requesting samples for? Eliquis 5mg   2. Are you currentlyout of this medication? Per pt will soon be out.

## 2015-11-04 NOTE — Telephone Encounter (Signed)
New Message  Patient calling the office for samples of medication:   1.  What medication and dosage are you requesting samples for? Eliquis 5mg   2.  Are you currently out of this medication? Per pt will soon be out.

## 2015-11-07 NOTE — Telephone Encounter (Signed)
placed 2 boxes of eliquis 5 mg at front desk, pt aware

## 2015-11-07 NOTE — Telephone Encounter (Signed)
Follow up       Patient calling the office for samples of medication:   1.  What medication and dosage are you requesting samples for?  eliquis 5mg   2.  Are you currently out of this medication? Almost out----pt states [facility] is out of samples and they told pt to call the [address] office

## 2015-11-10 ENCOUNTER — Other Ambulatory Visit: Payer: Self-pay | Admitting: Physician Assistant

## 2015-11-11 MED FILL — predniSONE 10 MG TABS: 10 | 3 days supply | Qty: 6 | Fill #0

## 2015-11-11 NOTE — Telephone Encounter (Signed)
Patient needs follow up appt  Thanks PC

## 2015-11-15 NOTE — Telephone Encounter (Signed)
Patient scheduled.

## 2015-11-16 ENCOUNTER — Telehealth: Payer: Self-pay | Admitting: Cardiology

## 2015-11-16 MED FILL — METOPROLOL SUCC ER 100 MG T: 100 | 30 days supply | Qty: 60 | Fill #2

## 2015-11-16 MED FILL — FUROSEMIDE 20 MG TABLET: 20 | 30 days supply | Qty: 30 | Fill #1

## 2015-11-16 NOTE — Telephone Encounter (Signed)
Patient calling the office for samples of medication: ° ° °1.  What medication and dosage are you requesting samples for? Eliquis ° °2.  Are you currently out of this medication?  ° ° °

## 2015-11-16 NOTE — Telephone Encounter (Signed)
Spoke with patient and advised no samples at this time  Confirmed local Rx available for pick up

## 2015-11-18 ENCOUNTER — Telehealth: Payer: Self-pay | Admitting: Cardiology

## 2015-11-18 ENCOUNTER — Telehealth: Payer: Self-pay | Admitting: Physician Assistant

## 2015-11-18 MED ORDER — DILTIAZEM HCL ER COATED BEADS 180 MG PO CP24: 180.0000 mg | ORAL_CAPSULE | Freq: Every day | ORAL | 1 refills | Status: DC

## 2015-11-18 MED FILL — CARTIA XT 180 MG CAPSULE SA: 180 | 30 days supply | Qty: 30 | Fill #0 | Status: TO

## 2015-11-18 NOTE — Telephone Encounter (Signed)
New message  Patient calling the office for samples of medication:   1.  What medication and dosage are you requesting samples for? Eliquis 5mg    2.  Are you currently out of this medication? Per pt almost out

## 2015-11-18 NOTE — Telephone Encounter (Signed)
°  Relation to [id number] Call back number:[phone] Pharmacy: [facility]  Reason for call: pt states he went out of town and when he arrived home he could not find his rx diltiazem (CARDIZEM CD) 180 MG 24 hr capsule.pt has appointment on 10/30 however would like to know if [facility] and provide him enough to get him through until he comes in for his appt

## 2015-11-18 NOTE — Telephone Encounter (Signed)
I have sent in a refill. Please inform the patient.

## 2015-11-18 NOTE — Telephone Encounter (Signed)
Patient informed, he has already p/u Rx/SLS 10/20

## 2015-11-18 NOTE — Telephone Encounter (Signed)
Patient aware samples are at the front desk for pick up  

## 2015-11-28 ENCOUNTER — Ambulatory Visit: Payer: Commercial Managed Care - HMO | Admitting: Physician Assistant

## 2015-11-28 ENCOUNTER — Telehealth: Payer: Self-pay | Admitting: Physician Assistant

## 2015-11-28 NOTE — Telephone Encounter (Signed)
Pt called in at 8:53 to reschedule his appt. Pt says that he has a Cardiology appt this morning also, and is unable to make both appt's pt rescheduled his appt with PCP for tomorrow at 1:15.     Should pt be charged?

## 2015-11-28 NOTE — Telephone Encounter (Signed)
No charge. 

## 2015-11-29 ENCOUNTER — Encounter: Payer: Self-pay | Admitting: Physician Assistant

## 2015-11-29 ENCOUNTER — Ambulatory Visit (INDEPENDENT_AMBULATORY_CARE_PROVIDER_SITE_OTHER): Payer: Commercial Managed Care - HMO | Admitting: Physician Assistant

## 2015-11-29 VITALS — BP 138/72 | HR 85 | Temp 98.1°F | Resp 16 | Ht 72.0 in | Wt 215.1 lb

## 2015-11-29 DIAGNOSIS — E876 Hypokalemia: Secondary | ICD-10-CM | POA: Diagnosis not present

## 2015-11-29 DIAGNOSIS — M109 Gout, unspecified: Secondary | ICD-10-CM

## 2015-11-29 LAB — BASIC METABOLIC PANEL
BUN: 12 mg/dL (ref 6–23)
CO2: 30 meq/L (ref 19–32)
CREATININE: 1.33 mg/dL (ref 0.40–1.50)
Calcium: 9.3 mg/dL (ref 8.4–10.5)
Chloride: 104 mEq/L (ref 96–112)
GFR: 55.74 mL/min — ABNORMAL LOW (ref >60.00)
Glucose, Bld: 101 mg/dL — ABNORMAL HIGH (ref 70–99)
POTASSIUM: 3.7 meq/L (ref 3.5–5.1)
Sodium: 143 mEq/L (ref 135–145)

## 2015-11-29 MED ORDER — HYDROCODONE-ACETAMINOPHEN 10-325 MG PO TABS: 1.0000 | ORAL_TABLET | Freq: Four times a day (QID) | ORAL | 0 refills | Status: DC | PRN

## 2015-11-29 MED ORDER — PREDNISONE 10 MG PO TABS: 20.0000 mg | ORAL_TABLET | Freq: Every day | ORAL | 0 refills | Status: DC

## 2015-11-29 MED FILL — predniSONE 20 MG TABS: 20 | 3 days supply | Qty: 3 | Fill #0

## 2015-11-29 MED FILL — HYDROCODON-APAP 10-325: 10-325 | 30 days supply | Qty: 120 | Fill #0

## 2015-11-29 NOTE — Progress Notes (Signed)
Pre visit review using our clinic review tool, if applicable. No additional management support is needed unless otherwise documented below in the visit note/SLS  

## 2015-11-29 NOTE — Patient Instructions (Signed)
As discussed previously, you need to seriously change your diet. See below.  I will allow a one time Rx for the prednisone for acute flare. You will need to schedule a follow-up appointment with Rheumatology.  Our office will be calling you to schedule an appointment with the specialist. You absolutely need to attend this visit.

## 2015-11-29 NOTE — Progress Notes (Signed)
Patient presents to clinic today c/o 2 flare up of gout in R great toe over the past two weeks. Each episode lasting a few days. Did take prednisone that he had around to resolve the initial flare-up. Patient is prescribed allopurinol 300 mg daily for gout prevention. Is taking as directed. Uric acid level remains elevated despite this regimen. Discussed previously with patient who refused additional medication as well as change to diet. Patient currently eats shellfish and diet high in red meats.   Past Medical History:  Diagnosis Date  . Ankylosing spondylitis ([place])   . Arthritis   . CAD (coronary artery disease) [id number]   30% mid LAD lesion on cardiac cath  . Chicken pox   . Cholelithiasis   . GERD (gastroesophageal reflux disease)   . [place] measles   . Hiatal hernia   . Hypertension   . Nephrolithiasis   . Persistent atrial fibrillation ([place])   . Sleep apnea   . 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 Ventricular hypertrophy     Current Outpatient Prescriptions on File Prior to Visit  Medication Sig Dispense Refill  . allopurinol (ZYLOPRIM) 300 MG tablet Take 1 tablet (300 mg total) by mouth daily. 30 tablet 2  . amLODipine (NORVASC) 5 MG tablet Take 1 tablet (5 mg total) by mouth daily. (Patient taking differently: Take 5 mg by mouth daily as needed (blood pressure). ) 30 tablet 5  . apixaban (ELIQUIS) 5 MG TABS tablet Take 1 tablet (5 mg total) by mouth 2 (two) times daily. 28 tablet 0  . diltiazem (CARDIZEM CD) 180 MG 24 hr capsule Take 1 capsule (180 mg total) by mouth daily. 30 capsule 1  . escitalopram (LEXAPRO) 10 MG tablet Take 1 tablet (10 mg total) by mouth daily. 30 tablet 3  . furosemide (LASIX) 20 MG tablet TAKE 1 TABLET BY MOUTH DAILY. 30 tablet 3  . HYDROcodone-acetaminophen (NORCO) 10-325 MG tablet Take 1 tablet by mouth every 6 (six) hours as needed. FILL ON OR AFTER October 28, 2015 120 tablet 0  . Loperamide HCl (IMODIUM PO) Take by mouth as needed. Reported  on 05/10/2015    . metoprolol succinate (TOPROL-XL) 100 MG 24 hr tablet TAKE 1 TABLET BY MOUTH 2 TIMES DAILY. 180 tablet 3  . omeprazole (PRILOSEC) 40 MG capsule Take 1 capsule (40 mg total) by mouth daily. 30 capsule 5  . potassium chloride SA (K-DUR,KLOR-CON) 20 MEQ tablet Take 1 tablet (20 mEq total) by mouth daily. 30 tablet 3  . tiZANidine (ZANAFLEX) 4 MG tablet Take 1 tablet (4 mg total) by mouth every 8 (eight) hours as needed for muscle spasms. 30 tablet 0   No current facility-administered medications on file prior to visit.     Allergies  Allergen Reactions  . Coumadin [Warfarin]     GI bleeds    Family History  Problem Relation Age of Onset  . Colon cancer Neg Hx   . Heart disease Father 40    Deceased  . Heart attack Father 64  . Heart disease Brother 45    "Died in his sleep"  . Arthritis/Rheumatoid Mother     Deceased-86  . Dementia Paternal Grandmother   . Cancer Paternal Uncle   . Hyperlipidemia Brother   . Stroke Brother 14    Deceased  . Other Sister     Estate agent in [place]  . Other Sister     MVA  . Other Brother     Carjacked-killed  .  Healthy Brother   . Healthy Son     X3  . Healthy Daughter     x1    Social History   Social History  . Marital status: Married    Spouse name: N/A  . Number of children: 6  . Years of education: N/A   Occupational History  . Retired Retired    Estate manager/land agent   Social History Main Topics  . Smoking status: Never Smoker  . Smokeless tobacco: Never Used  . Alcohol use No  . Drug use: No  . Sexual activity: Not Asked   Other Topics Concern  . None   Social History Narrative   4 caffeine drinks daily    Review of Systems - See HPI.  All other ROS are negative.  Ht 6' (1.829 m)   Wt 215 lb 2 oz (97.6 kg)   BMI 29.18 kg/m   Physical Exam  Constitutional: He is oriented to person, place, and time and well-developed, well-nourished, and in no distress.  HENT:  Head: Normocephalic and atraumatic.  Eyes:  Conjunctivae are normal.  Neck: Neck supple.  Cardiovascular: Normal rate, regular rhythm, normal heart sounds and intact distal pulses.   Pulmonary/Chest: Effort normal.  Musculoskeletal:       Feet:  Neurological: He is alert and oriented to person, place, and time.  Skin: Skin is warm and dry.  Psychiatric: Affect normal.  Vitals reviewed.   Recent Results (from the past 2160 hour(s))  Uric acid     Status: Abnormal   Collection Time: 09/12/15  3:03 PM  Result Value Ref Range   Uric Acid, Serum 10.0 (H) 4.0 - 7.8 mg/dL  Basic metabolic panel     Status: Abnormal   Collection Time: 09/12/15  3:03 PM  Result Value Ref Range   Sodium 142 135 - 145 mEq/L   Potassium 3.2 (L) 3.5 - 5.1 mEq/L   Chloride 101 96 - 112 mEq/L   CO2 31 19 - 32 mEq/L   Glucose, Bld 106 (H) 70 - 99 mg/dL   BUN 15 6 - 23 mg/dL   Creatinine, Ser 1.31 0.40 - 1.50 mg/dL   Calcium 9.1 8.4 - 10.5 mg/dL   GFR 56.76 (L) >60.00 mL/min    Assessment/Plan: 1. Hypokalemia Noted on prior labs. Repeat BMP today as patient overdue. - Basic metabolic panel  2. Gout of right foot, unspecified cause, unspecified chronicity Chronic. Patient refusing to change diet that is a major factor to his issue. Also refuses new medication. Will refer to Rheumatology to discuss higher dosing of allopurinol.   Leeanne Rio, PA-C

## 2015-12-02 ENCOUNTER — Other Ambulatory Visit: Payer: Self-pay | Admitting: Physician Assistant

## 2015-12-02 DIAGNOSIS — E876 Hypokalemia: Secondary | ICD-10-CM

## 2015-12-02 MED ORDER — ESCITALOPRAM OXALATE 10 MG PO TABS: 10.0000 mg | ORAL_TABLET | Freq: Every day | ORAL | 0 refills | Status: DC

## 2015-12-02 MED ORDER — POTASSIUM CHLORIDE CRYS ER 20 MEQ PO TBCR: 20.0000 meq | EXTENDED_RELEASE_TABLET | Freq: Every day | ORAL | 0 refills | Status: DC

## 2015-12-02 MED ORDER — AMLODIPINE BESYLATE 5 MG PO TABS: 5.0000 mg | ORAL_TABLET | Freq: Every day | ORAL | 0 refills | Status: DC

## 2015-12-08 ENCOUNTER — Telehealth: Payer: Self-pay | Admitting: Physician Assistant

## 2015-12-08 MED ORDER — FUROSEMIDE 20 MG PO TABS: 20.0000 mg | ORAL_TABLET | Freq: Every day | ORAL | 0 refills | Status: DC

## 2015-12-08 MED ORDER — AMLODIPINE BESYLATE 5 MG PO TABS: 5.0000 mg | ORAL_TABLET | Freq: Every day | ORAL | 0 refills | Status: DC

## 2015-12-08 MED ORDER — TIZANIDINE HCL 4 MG PO TABS: 4.0000 mg | ORAL_TABLET | Freq: Three times a day (TID) | ORAL | 0 refills | Status: DC | PRN

## 2015-12-08 NOTE — Telephone Encounter (Signed)
Caller name: Relationship to patient: Pharmacy Can be reached: Pharmacy:  [facility], [place] (Phone) [phone] (Fax)     Reason for call: Request refills on furosemide (LASIX) 20 MG tablet [id number]  diltiazem (CARDIZEM CD) 180 MG 24 hr capsule [id number] metoprolol succinate (TOPROL-XL) 100 MG 24 hr tablet [id number] predniSONE (DELTASONE) 10 MG tablet [id number]  tiZANidine (ZANAFLEX) 4 MG tablet [id number]

## 2015-12-08 NOTE — Telephone Encounter (Signed)
Pt notified of below and verbalized understanding.  

## 2015-12-08 NOTE — Telephone Encounter (Signed)
I have sent in the amlodipine, Furosemide and Tizanidine to the mail order pharmacy.   The Toprol and Diltiazem are prescribed by his Cardiologist. He will need to contact them for refill.  The prednisone is only an as needed prescription. I will not be granting any 90-day supplies of this medication.   Please inform patient.

## 2015-12-20 ENCOUNTER — Telehealth: Payer: Self-pay | Admitting: Cardiology

## 2015-12-20 NOTE — Telephone Encounter (Signed)
Pt is aware we do have Eliquis sample 5 mg, and pt sample is available for pick up anytime before 5:00pm.

## 2015-12-20 NOTE — Telephone Encounter (Signed)
New message ° °Patient calling the office for samples of medication: ° ° °1.  What medication and dosage are you requesting samples for?apixaban (ELIQUIS) 5 MG TABS tablet ° °2.  Are you currently out of this medication? yes ° ° ° °

## 2015-12-26 ENCOUNTER — Telehealth: Payer: Self-pay | Admitting: Physician Assistant

## 2015-12-26 NOTE — Telephone Encounter (Signed)
Indication for chronic opioid: Gout flare Medication and dose: Hydrocodone acetaminophen 10/325 mg # pills per month: #120 0rf Last UDS date: 04/26/15 Pain contract signed (Y/N): Yes on 05/03/14

## 2015-12-26 NOTE — Telephone Encounter (Signed)
Pt needs refill om hydrocodone

## 2015-12-27 MED ORDER — HYDROCODONE-ACETAMINOPHEN 10-325 MG PO TABS: 1.0000 | ORAL_TABLET | Freq: Four times a day (QID) | ORAL | 0 refills | Status: DC | PRN

## 2015-12-27 NOTE — Telephone Encounter (Signed)
Patient aware rx is ready for pick up up front.

## 2015-12-27 NOTE — Telephone Encounter (Signed)
Indication for chronic opioid: Ankylosing spondylitis, OA of multiple joints Medication and dose: Hydrocodone APAP 10-325 # pills per month: 120 Last UDS date: 04/26/15 Pain contract signed (Y/N): Y Date narcotic database last reviewed (include red flags): 12/27/15  Rx refill printed and signed. Can be filled on 12/29/15

## 2015-12-28 ENCOUNTER — Other Ambulatory Visit: Payer: Self-pay | Admitting: *Deleted

## 2015-12-28 MED ORDER — DILTIAZEM HCL ER COATED BEADS 180 MG PO CP24: 180.0000 mg | ORAL_CAPSULE | Freq: Every day | ORAL | 2 refills | Status: DC

## 2015-12-28 MED ORDER — METOPROLOL SUCCINATE ER 100 MG PO TB24: ORAL_TABLET | ORAL | 2 refills | Status: DC

## 2015-12-28 MED FILL — DILTIAZEM 24HR ER 180 MG CA: 180 | 30 days supply | Qty: 30 | Fill #0

## 2015-12-28 NOTE — Telephone Encounter (Signed)
Rx has been sent to the pharmacy electronically. Diltiazem and Metoprolol send into pt [facility]

## 2015-12-29 MED FILL — HYDROCODON-APAP 10-325: 10-325 | 30 days supply | Qty: 120 | Fill #0

## 2016-01-03 ENCOUNTER — Telehealth: Payer: Self-pay | Admitting: *Deleted

## 2016-01-03 NOTE — Telephone Encounter (Signed)
[name] Noble, PA-C put pt on Amlodipine 5 mg daily, pt is currently taking Diltiazem 180 mg daily, can pt be on Amlodipine and Diltiazem together, If not which of these medications should pt stop?

## 2016-01-04 ENCOUNTER — Telehealth: Payer: Self-pay | Admitting: Cardiology

## 2016-01-04 ENCOUNTER — Other Ambulatory Visit: Payer: Self-pay | Admitting: *Deleted

## 2016-01-04 ENCOUNTER — Other Ambulatory Visit: Payer: Self-pay | Admitting: Physician Assistant

## 2016-01-04 NOTE — Telephone Encounter (Signed)
Patient left a msg on the refill vm stating that he is out of the metoprolol and diltiazem and [facility] has not received the rx's per patient.

## 2016-01-04 NOTE — Telephone Encounter (Signed)
Is this a maintenance medication for patient?

## 2016-01-04 NOTE — Telephone Encounter (Signed)
No samples ELIQUIS 5 MG TABS

## 2016-01-04 NOTE — Telephone Encounter (Signed)
I would not do both.  I would stop the Norvasc and they can pick another BP med if he needs it.

## 2016-01-04 NOTE — Telephone Encounter (Signed)
Patient calling the office for samples of medication:   1.  What medication and dosage are you requesting samples for?Eliquis  2.  Are you currently out of this medication? No,on his last pack

## 2016-01-05 ENCOUNTER — Telehealth: Payer: Self-pay | Admitting: Physician Assistant

## 2016-01-05 ENCOUNTER — Other Ambulatory Visit: Payer: Self-pay | Admitting: Physician Assistant

## 2016-01-05 MED ORDER — DILTIAZEM HCL ER COATED BEADS 180 MG PO CP24: 180.0000 mg | ORAL_CAPSULE | Freq: Every day | ORAL | 2 refills | Status: DC

## 2016-01-05 MED ORDER — METOPROLOL SUCCINATE ER 100 MG PO TB24: ORAL_TABLET | ORAL | 2 refills | Status: DC

## 2016-01-05 NOTE — Telephone Encounter (Signed)
Have patient stop the amlodipine immediately. Seems a refill request was received from mail order pharmacy from patient and was sent in by staff. Continue Cardizem as directed by Cardiology and have him schedule a follow-up appointment with [place] to reassess his blood pressure to make sure nothing else needs to be added.

## 2016-01-05 NOTE — Telephone Encounter (Signed)
Called pt and could not leave a message as the phone advised that pt was not available.

## 2016-01-05 NOTE — Telephone Encounter (Signed)
Nya with [facility] calling on behalf of Dr. Percival Spanish to report Dr. Percival Spanish has advised patient to discontinue taking amLODipine (NORVASC) 5 MG tablet since he is also taking diltiazem (CARDIZEM CD) 180 MG 24 hr capsule.  He is recommending patient be placed on another bp medication if he needs it.  Please feel free to contact their office with any follow-up questions or concerns.

## 2016-01-05 NOTE — Telephone Encounter (Signed)
Spoke with [name] Woods at [facility] in [place] about [name] Woods taking Amlodipine and Diltiazem, Mr Raiford Noble PA was out office today, she stated she will send him a message, also told her that [name] Woods will stop Amlodipine.  Called and spoke with [name] Woods to STOP Amlodipine

## 2016-01-06 ENCOUNTER — Other Ambulatory Visit: Payer: Self-pay | Admitting: Physician Assistant

## 2016-01-06 NOTE — Telephone Encounter (Signed)
Unable to leave message np VM

## 2016-01-06 NOTE — Telephone Encounter (Signed)
Samples at the front desk Pt notified 

## 2016-01-06 NOTE — Telephone Encounter (Signed)
Spoke with patient this morning. Notes he made sure to completely stop medication as directed. Notes he was not really taking the medication anyway which is good. Is checking home BP and noting to be normotensive. Discussed him to keep an eye on BP. If starting to become elevated, he needs assessment in office.

## 2016-01-24 ENCOUNTER — Telehealth: Payer: Self-pay | Admitting: Cardiology

## 2016-01-24 ENCOUNTER — Telehealth: Payer: Self-pay | Admitting: *Deleted

## 2016-01-24 NOTE — Telephone Encounter (Signed)
Patient calling the office for samples of medication: ° ° °1.  What medication and dosage are you requesting samples for? Eliquis 5 mg  ° °2.  Are you currently out of this medication? yes ° ° °

## 2016-01-24 NOTE — Telephone Encounter (Signed)
Medication samples have been provided to the patient.  Drug name: Eliquis 5mg   Qty: 28 tablets LOT: [id number]  Exp.Date: 01/2018  Samples left at front desk for patient pick-up. Patient notified.  [name] Woods [name] 10:54 AM 01/24/2016

## 2016-01-24 NOTE — Telephone Encounter (Signed)
Patient called stating he needs a new RX for the hydrocodone-acetaminophen.     He said that he has enough to get through this week, but not enough until Tuesday when Einar Pheasant will be in the office.  I informed patient that I would see if we could get him the RX or at least enough to get him through until [place] returned.   Patient stated that he needed "all or nothing" on this prescription.  Message routed to Dr. Birdie Riddle to see if anything can be done before Einar Pheasant returns.

## 2016-01-24 NOTE — Telephone Encounter (Signed)
I'm sorry he has the 'all or nothing' attitude.  He gets #120 pills/month and since he is not my patient, I am not comfortable w/ that.  I am happy to give him #30 until [name] Woods returns or he can wait for his whole prescription.  It is his choice

## 2016-01-24 NOTE — Telephone Encounter (Signed)
Patient states that he will take the 30 that Dr. Birdie Riddle can give.  I let him know that we would call him when it is ready for pickup.

## 2016-01-25 ENCOUNTER — Other Ambulatory Visit: Payer: Self-pay | Admitting: *Deleted

## 2016-01-25 MED ORDER — HYDROCODONE-ACETAMINOPHEN 10-325 MG PO TABS: 1.0000 | ORAL_TABLET | Freq: Four times a day (QID) | ORAL | 0 refills | Status: DC | PRN

## 2016-01-25 NOTE — Telephone Encounter (Signed)
Ok to print the #30 Hydrocodone and place on ledge for me to sign

## 2016-01-25 NOTE — Telephone Encounter (Signed)
Prescription signed and up front for pickup.    Patient aware.

## 2016-01-31 ENCOUNTER — Telehealth: Payer: Self-pay | Admitting: Physician Assistant

## 2016-01-31 DIAGNOSIS — I10 Essential (primary) hypertension: Secondary | ICD-10-CM

## 2016-01-31 NOTE — Addendum Note (Signed)
Addended by: Katina Dung on: 01/31/2016 01:32 PM   Modules accepted: Orders

## 2016-01-31 NOTE — Telephone Encounter (Signed)
Spoke with patient and let him know that he will need lab work before the full RX can be given.    He agreed to come in tomorrow morning for labs.  Patient stated that he is taking 4 per day. I discussed the pain management with him and he was not too thrilled about this.  He stated that right now he has been without his medicine for a week and he just wants his prescription and then pain management could be discussed.  I scheduled patient for his labs, placed order for CMP and I have routed the message back to PCP for FYI.

## 2016-01-31 NOTE — Telephone Encounter (Signed)
Pt states that do to insurance, he is asking if Einar Pheasant would rewrite his Rx on HYDROcodone for 120 tablets and he not p/u that one that KT wrote last week for 30 tablets, Pt states that ins will not let him pick up but only once a month.

## 2016-01-31 NOTE — Telephone Encounter (Signed)
Checked up front and patient did not pick up rx for the #30 of Hydrocodone. Please advise

## 2016-01-31 NOTE — Telephone Encounter (Signed)
Patient is overdue for repeat CMP. This needs to be completed. Once completed I will grant a full refill of medication. Also if he is needing 4 tablets per day (120 tablets per month), he needs to see a specialist. I will be setting him up with a pain specialist for further medication management going forward. If liver function still looks good, we will continue medications until he establishes with specialist.  Ok to place referral to Pain Management.

## 2016-02-01 ENCOUNTER — Other Ambulatory Visit (INDEPENDENT_AMBULATORY_CARE_PROVIDER_SITE_OTHER): Payer: Medicare HMO

## 2016-02-01 ENCOUNTER — Other Ambulatory Visit: Payer: Self-pay | Admitting: Physician Assistant

## 2016-02-01 DIAGNOSIS — I10 Essential (primary) hypertension: Secondary | ICD-10-CM

## 2016-02-01 LAB — COMPREHENSIVE METABOLIC PANEL
ALBUMIN: 3.9 g/dL (ref 3.5–5.2)
ALK PHOS: 58 U/L (ref 39–117)
ALT: 12 U/L (ref 0–53)
AST: 19 U/L (ref 0–37)
BUN: 15 mg/dL (ref 6–23)
CALCIUM: 9.3 mg/dL (ref 8.4–10.5)
CO2: 34 mEq/L — ABNORMAL HIGH (ref 19–32)
Chloride: 104 mEq/L (ref 96–112)
Creatinine, Ser: 1.41 mg/dL (ref 0.40–1.50)
GFR: 52.08 mL/min — AB (ref >60.00)
Glucose, Bld: 103 mg/dL — ABNORMAL HIGH (ref 70–99)
POTASSIUM: 3.9 meq/L (ref 3.5–5.1)
Sodium: 144 mEq/L (ref 135–145)
TOTAL PROTEIN: 6 g/dL (ref 6.0–8.3)
Total Bilirubin: 2.2 mg/dL — ABNORMAL HIGH (ref 0.2–1.2)

## 2016-02-01 MED ORDER — HYDROCODONE-ACETAMINOPHEN 10-325 MG PO TABS: 1.0000 | ORAL_TABLET | Freq: Four times a day (QID) | ORAL | 0 refills | Status: DC | PRN

## 2016-02-01 MED FILL — HYDROCODON-APAP 10-325: 10-325 | 30 days supply | Qty: 120 | Fill #0

## 2016-02-01 NOTE — Telephone Encounter (Signed)
Spoke with patient in office. Rx granted. Discussed need to try to wean medication. He is going to start by decreasing to TID dosing and will call in 2 weeks to let us know how he is doing so we can work further on wean. If unable to do so, will need pain management referral.  CMP obtained today.

## 2016-02-01 NOTE — Telephone Encounter (Signed)
Patient had lab visit today and requesting his Hydrocodone rx for #120. Please advise

## 2016-02-03 ENCOUNTER — Encounter: Payer: Self-pay | Admitting: Emergency Medicine

## 2016-02-07 ENCOUNTER — Telehealth: Payer: Self-pay | Admitting: Emergency Medicine

## 2016-02-07 ENCOUNTER — Telehealth: Payer: Self-pay | Admitting: Cardiology

## 2016-02-07 NOTE — Telephone Encounter (Signed)
°  New Prob  Patient calling the office for samples of medication:   1.  What medication and dosage are you requesting samples for? Eliquis 5 mg  2.  Are you currently out of this medication?  Has enough for a few more days

## 2016-02-07 NOTE — Telephone Encounter (Signed)
Spoke with the pharmacist at [facility] mail order. Advised clarification of medication. Advised the Amlodipine was D/C and he is currently on Diltiazem daily.

## 2016-02-08 MED ORDER — APIXABAN 5 MG PO TABS: 5.0000 mg | ORAL_TABLET | Freq: Two times a day (BID) | ORAL | 5 refills | Status: DC

## 2016-02-08 NOTE — Telephone Encounter (Signed)
tried to call pt, would not go through.

## 2016-02-08 NOTE — Telephone Encounter (Signed)
Advised patient and Rx sent to [facility] outpatient

## 2016-02-08 NOTE — Telephone Encounter (Signed)
Advised no samples and he asked that I reach out to [facility] office. No samples at either location Tried to call patient, currently he is unavailable and can not leave message

## 2016-02-08 NOTE — Telephone Encounter (Signed)
°  New Prob  Patient calling the office for samples of medication:   1. What medication and dosage are you requesting samples for? Eliquis 5 mg  2. Are you currentlyout of this medication?  Has enough for a few more days  Pt initially reached out to [facility] office for samples. Was told to try [facility]. as [facility] did not have any samples. Please call.

## 2016-02-08 NOTE — Addendum Note (Signed)
Addended by: Alvina Filbert B on: 02/08/2016 06:00 PM   Modules accepted: Orders

## 2016-02-08 NOTE — Telephone Encounter (Signed)
[name] Woods [phone]  [name] Woods 1 hour ago (3:53 PM)    [name] Woods 5 hours ago (11:51 AM)    Advised no samples and he asked that I reach out to [employer]. No samples at either location Tried to call patient, currently he is unavailable and can not leave message   Documentation     [name] Levy, RN routed conversation to [facility] Clinical Pool 7 hours ago (9:30 AM)    [name] Woods routed conversation to [facility] Rx Refill Pool Yesterday (12:45 PM)    [place] (12:45 PM)     New Prob  Patient calling the office for samples of medication:   1. What medication and dosage are you requesting samples for? Eliquis 5 mg  2. Are you currentlyout of this medication?  Has enough for a few more days     Documentation         NO SAMPLES

## 2016-02-09 ENCOUNTER — Ambulatory Visit (INDEPENDENT_AMBULATORY_CARE_PROVIDER_SITE_OTHER): Payer: Medicare HMO | Admitting: Physician Assistant

## 2016-02-09 ENCOUNTER — Encounter: Payer: Self-pay | Admitting: Physician Assistant

## 2016-02-09 VITALS — BP 120/76 | HR 87 | Temp 98.2°F | Resp 16 | Ht 72.0 in | Wt 213.0 lb

## 2016-02-09 DIAGNOSIS — J0110 Acute frontal sinusitis, unspecified: Secondary | ICD-10-CM | POA: Diagnosis not present

## 2016-02-09 MED ORDER — BENZONATATE 100 MG PO CAPS: 100.0000 mg | ORAL_CAPSULE | Freq: Two times a day (BID) | ORAL | 0 refills | Status: DC

## 2016-02-09 MED ORDER — DOXYCYCLINE HYCLATE 100 MG PO CAPS
100.0000 mg | ORAL_CAPSULE | Freq: Two times a day (BID) | ORAL | 0 refills | Status: DC
Start: 2016-02-09 — End: 2016-03-02

## 2016-02-09 MED FILL — BENZONATATE 100 MG CAPSULE: 100 | 10 days supply | Qty: 20 | Fill #0

## 2016-02-09 MED FILL — DOXYCYCLINE HYCLATE 100 MG: 100 | 10 days supply | Qty: 20 | Fill #0

## 2016-02-09 NOTE — Progress Notes (Signed)
Patient presents to clinic today c/o week of sinus pressure, pnd with cough. Patient now with sinus pain, mostly frontal and bilateral. Endorses fatigue. Denies fever but notes chills. Endorses some R ear pain. Denies recent travel. Is taking chronic medications as directed. Has used some Dayquil yesterday for symptoms relief.   Past Medical History:  Diagnosis Date  . Ankylosing spondylitis ([place])   . Arthritis   . CAD (coronary artery disease) 1191,4782   30% mid LAD lesion on cardiac cath  . Chicken pox   . Cholelithiasis   . GERD (gastroesophageal reflux disease)   . [place] measles   . Hiatal hernia   . Hypertension   . Nephrolithiasis   . Persistent atrial fibrillation ([place])   . Sleep apnea   . 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 Ventricular hypertrophy     Current Outpatient Prescriptions on File Prior to Visit  Medication Sig Dispense Refill  . allopurinol (ZYLOPRIM) 300 MG tablet Take 1 tablet (300 mg total) by mouth daily. 30 tablet 2  . apixaban (ELIQUIS) 5 MG TABS tablet Take 1 tablet (5 mg total) by mouth 2 (two) times daily. 60 tablet 5  . diltiazem (CARDIZEM CD) 180 MG 24 hr capsule Take 1 capsule (180 mg total) by mouth daily. 90 capsule 2  . escitalopram (LEXAPRO) 10 MG tablet Take 1 tablet (10 mg total) by mouth daily. 90 tablet 0  . furosemide (LASIX) 20 MG tablet Take 1 tablet (20 mg total) by mouth daily. 90 tablet 0  . HYDROcodone-acetaminophen (NORCO) 10-325 MG tablet Take 1 tablet by mouth every 6 (six) hours as needed. Can fill on or after 12/29/15 120 tablet 0  . Loperamide HCl (IMODIUM PO) Take by mouth as needed. Reported on 05/10/2015    . metoprolol succinate (TOPROL-XL) 100 MG 24 hr tablet TAKE 1 TABLET BY MOUTH 2 TIMES DAILY. 180 tablet 2  . omeprazole (PRILOSEC) 40 MG capsule Take 1 capsule (40 mg total) by mouth daily. 30 capsule 5  . potassium chloride SA (K-DUR,KLOR-CON) 20 MEQ tablet Take 1 tablet (20 mEq total) by mouth daily. 90 tablet 0   No  current facility-administered medications on file prior to visit.     Allergies  Allergen Reactions  . Coumadin [Warfarin]     GI bleeds    Family History  Problem Relation Age of Onset  . Colon cancer Neg Hx   . Heart disease Father 34    Deceased  . Heart attack Father 77  . Heart disease Brother 3    "Died in his sleep"  . Arthritis/Rheumatoid Mother     Deceased-86  . Dementia Paternal Grandmother   . Cancer Paternal Uncle   . Hyperlipidemia Brother   . Stroke Brother 51    Deceased  . Other Sister     Estate agent in [place]  . Other Sister     MVA  . Other Brother     Carjacked-killed  . Healthy Brother   . Healthy Son     X3  . Healthy Daughter     x1    Social History   Social History  . Marital status: Married    Spouse name: N/A  . Number of children: 6  . Years of education: N/A   Occupational History  . Retired Retired    Estate manager/land agent   Social History Main Topics  . Smoking status: Never Smoker  . Smokeless tobacco: Never Used  . Alcohol use No  .  Drug use: No  . Sexual activity: Not Asked   Other Topics Concern  . None   Social History Narrative   4 caffeine drinks daily    Review of Systems - See HPI.  All other ROS are negative.  BP 120/76   Pulse 87   Temp 98.2 F (36.8 C) (Oral)   Resp 16   Ht 6' (1.829 m)   Wt 213 lb (96.6 kg)   SpO2 94%   BMI 28.89 kg/m   Physical Exam  Constitutional: He is oriented to person, place, and time and well-developed, well-nourished, and in no distress.  HENT:  Head: Normocephalic and atraumatic.  Right Ear: Tympanic membrane normal.  Left Ear: Tympanic membrane normal.  Nose: Mucosal edema and rhinorrhea present. Right sinus exhibits frontal sinus tenderness.  Mouth/Throat: Uvula is midline, oropharynx is clear and moist and mucous membranes are normal.  Eyes: Conjunctivae are normal.  Neck: Neck supple.  Cardiovascular: Normal rate, regular rhythm, normal heart sounds and intact distal  pulses.   Pulmonary/Chest: Effort normal and breath sounds normal. No respiratory distress. He has no wheezes. He has no rales. He exhibits no tenderness.  Lymphadenopathy:    He has no cervical adenopathy.  Neurological: He is alert and oriented to person, place, and time.  Skin: Skin is warm and dry. No rash noted.  Psychiatric: Affect normal.  Vitals reviewed.   Recent Results (from the past 2160 hour(s))  Basic metabolic panel     Status: Abnormal   Collection Time: 11/29/15  1:51 PM  Result Value Ref Range   Sodium 143 135 - 145 mEq/L   Potassium 3.7 3.5 - 5.1 mEq/L   Chloride 104 96 - 112 mEq/L   CO2 30 19 - 32 mEq/L   Glucose, Bld 101 (H) 70 - 99 mg/dL   BUN 12 6 - 23 mg/dL   Creatinine, Ser 1.33 0.40 - 1.50 mg/dL   Calcium 9.3 8.4 - 10.5 mg/dL   GFR 55.74 (L) >60.00 mL/min  Comp Met (CMET)     Status: Abnormal   Collection Time: 02/01/16 10:30 AM  Result Value Ref Range   Sodium 144 135 - 145 mEq/L   Potassium 3.9 3.5 - 5.1 mEq/L   Chloride 104 96 - 112 mEq/L   CO2 34 (H) 19 - 32 mEq/L   Glucose, Bld 103 (H) 70 - 99 mg/dL   BUN 15 6 - 23 mg/dL   Creatinine, Ser 1.41 0.40 - 1.50 mg/dL   Total Bilirubin 2.2 (H) 0.2 - 1.2 mg/dL   Alkaline Phosphatase 58 39 - 117 U/L   AST 19 0 - 37 U/L   ALT 12 0 - 53 U/L   Total Protein 6.0 6.0 - 8.3 g/dL   Albumin 3.9 3.5 - 5.2 g/dL   Calcium 9.3 8.4 - 10.5 mg/dL   GFR 52.08 (L) >60.00 mL/min    Assessment/Plan: 1. Acute non-recurrent frontal sinusitis + TTP R Frontal sinuses. Rx Doxycycline. Rx Tessalon. Supportive measures and OTC medications reviewed with patient.   - benzonatate (TESSALON) 100 MG capsule; Take 1 capsule (100 mg total) by mouth 2 (two) times daily.  Dispense: 20 capsule; Refill: 0 - doxycycline (VIBRAMYCIN) 100 MG capsule; Take 1 capsule (100 mg total) by mouth 2 (two) times daily.  Dispense: 20 capsule; Refill: 0   Leeanne Rio, [place]

## 2016-02-09 NOTE — Patient Instructions (Signed)
Please take antibiotic as directed.  Increase fluid intake.  Use Saline nasal spray.  Take a daily multivitamin. Use Tessalon as directed for cough.  Place a humidifier in the bedroom.  Please call or return clinic if symptoms are not improving.  Sinusitis Sinusitis is redness, soreness, and swelling (inflammation) of the paranasal sinuses. Paranasal sinuses are air pockets within the bones of your face (beneath the eyes, the middle of the forehead, or above the eyes). In healthy paranasal sinuses, mucus is able to drain out, and air is able to circulate through them by way of your nose. However, when your paranasal sinuses are inflamed, mucus and air can become trapped. This can allow bacteria and other germs to grow and cause infection. Sinusitis can develop quickly and last only a short time (acute) or continue over a long period (chronic). Sinusitis that lasts for more than 12 weeks is considered chronic.  CAUSES  Causes of sinusitis include:  Allergies.  Structural abnormalities, such as displacement of the cartilage that separates your nostrils (deviated septum), which can decrease the air flow through your nose and sinuses and affect sinus drainage.  Functional abnormalities, such as when the small hairs (cilia) that line your sinuses and help remove mucus do not work properly or are not present. SYMPTOMS  Symptoms of acute and chronic sinusitis are the same. The primary symptoms are pain and pressure around the affected sinuses. Other symptoms include:  Upper toothache.  Earache.  Headache.  Bad breath.  Decreased sense of smell and taste.  A cough, which worsens when you are lying flat.  Fatigue.  Fever.  Thick drainage from your nose, which often is green and may contain pus (purulent).  Swelling and warmth over the affected sinuses. DIAGNOSIS  Your caregiver will perform a physical exam. During the exam, your caregiver may:  Look in your nose for signs of abnormal  growths in your nostrils (nasal polyps).  Tap over the affected sinus to check for signs of infection.  View the inside of your sinuses (endoscopy) with a special imaging device with a light attached (endoscope), which is inserted into your sinuses. If your caregiver suspects that you have chronic sinusitis, one or more of the following tests may be recommended:  Allergy tests.  Nasal culture A sample of mucus is taken from your nose and sent to a lab and screened for bacteria.  Nasal cytology A sample of mucus is taken from your nose and examined by your caregiver to determine if your sinusitis is related to an allergy. TREATMENT  Most cases of acute sinusitis are related to a viral infection and will resolve on their own within 10 days. Sometimes medicines are prescribed to help relieve symptoms (pain medicine, decongestants, nasal steroid sprays, or saline sprays).  However, for sinusitis related to a bacterial infection, your caregiver will prescribe antibiotic medicines. These are medicines that will help kill the bacteria causing the infection.  Rarely, sinusitis is caused by a fungal infection. In theses cases, your caregiver will prescribe antifungal medicine. For some cases of chronic sinusitis, surgery is needed. Generally, these are cases in which sinusitis recurs more than 3 times per year, despite other treatments. HOME CARE INSTRUCTIONS   Drink plenty of water. Water helps thin the mucus so your sinuses can drain more easily.  Use a humidifier.  Inhale steam 3 to 4 times a day (for example, sit in the bathroom with the shower running).  Apply a warm, moist washcloth to your face   face 3 to 4 times a day, or as directed by your caregiver.  Use saline nasal sprays to help moisten and clean your sinuses.  Take over-the-counter or prescription medicines for pain, discomfort, or fever only as directed by your caregiver. SEEK IMMEDIATE MEDICAL CARE IF:  You have increasing pain or  severe headaches.  You have nausea, vomiting, or drowsiness.  You have swelling around your face.  You have vision problems.  You have a stiff neck.  You have difficulty breathing. MAKE SURE YOU:   Understand these instructions.  Will watch your condition.  Will get help right away if you are not doing well or get worse. Document Released: 01/15/2005 Document Revised: 04/09/2011 Document Reviewed: 01/30/2011 [facility] Patient Information 2014 [place], [place].

## 2016-02-09 NOTE — Progress Notes (Signed)
Pre visit review using our clinic review tool, if applicable. No additional management support is needed unless otherwise documented below in the visit note. 

## 2016-02-10 ENCOUNTER — Other Ambulatory Visit: Payer: Self-pay | Admitting: Physician Assistant

## 2016-02-10 ENCOUNTER — Telehealth: Payer: Self-pay | Admitting: Cardiology

## 2016-02-10 MED ORDER — APIXABAN 5 MG PO TABS: 5.0000 mg | ORAL_TABLET | Freq: Two times a day (BID) | ORAL | 0 refills | Status: DC

## 2016-02-10 NOTE — Telephone Encounter (Signed)
New Message     Do you have any samples of the Eliquis 2.5 or 5mg ??

## 2016-02-10 NOTE — Telephone Encounter (Signed)
Pt states that he needs a new Rx for amlodipine sent to [facility] mail order, Pt states that he was not able to see the specialist regarding his gout due to an appt he had to take his wife to and asking if Einar Pheasant will call this in for him.

## 2016-02-10 NOTE — Telephone Encounter (Signed)
Returned call to patient-made aware we are out of samples at this time.  Pt verbalized he only has one pill left.  Offered to send a short supply rx to local pharmacy until he can get it refilled by [facility].  Pt agreed and verbalized understanding.  Rx sent electronically for 7 day supply to preferred pharmacy.  Pt aware.

## 2016-02-10 NOTE — Telephone Encounter (Signed)
Spoke with patient to verify rx requested. Patient wanted his Allopurinol refilled to the [facility] mail order pharmacy. Advised we can send for him

## 2016-02-10 NOTE — Telephone Encounter (Signed)
From recent visit with patient he states the cardiologist stopped the Amlodipine.

## 2016-02-13 MED ORDER — ALLOPURINOL 100 MG PO TABS: 100.0000 mg | ORAL_TABLET | Freq: Every day | ORAL | 1 refills | Status: DC

## 2016-02-13 NOTE — Telephone Encounter (Signed)
Can we please assess when patient last took allopurinol? Per records seems it has been a while since last filled. If so we would want to restart allopurinol at 100 mg daily instead of 300 mg daily and titrate up if needed.

## 2016-02-13 NOTE — Telephone Encounter (Signed)
Spoke with patient and he states he has been out of the Allopurinol 300 mg. Advised patient since he has been out of the allopurinol will need to restart at 100 mg.  Patient was very confused about the Amlodipine 5 mg which was stopped by the Cardiologist. Advised to stop the Amlodipine and will refill the Allopurinol.

## 2016-02-13 NOTE — Addendum Note (Signed)
Addended by: Leonidas Romberg on: 02/13/2016 11:45 AM   Modules accepted: Orders

## 2016-02-17 ENCOUNTER — Telehealth: Payer: Self-pay | Admitting: Cardiology

## 2016-02-17 NOTE — Telephone Encounter (Signed)
[facility] did not have any Eliquis samples,pt was told to check with [facility]  Patient calling the office for samples of medication:   1.  What medication and dosage are you requesting samples for? Eliquis 5 mg  2.  Are you currently out of this medication? yes  .

## 2016-02-17 NOTE — Telephone Encounter (Signed)
Patient aware that I will place one box at the front desk.

## 2016-02-17 NOTE — Telephone Encounter (Signed)
New message ° ° ° ° ° ° °Patient calling the office for samples of medication: ° ° °1.  What medication and dosage are you requesting samples for?  eliquis 5mg ° °2.  Are you currently out of this medication?  Almost out of medication ° ° °

## 2016-02-18 ENCOUNTER — Telehealth: Payer: Self-pay | Admitting: Physician Assistant

## 2016-02-18 NOTE — Telephone Encounter (Signed)
Please call patient to schedule his physical. He is overdue for recheck of cholesterol and for maintenance of chronic issues. Also do for follow-up regarding pain level or reduced dosage of medication.

## 2016-02-20 NOTE — Telephone Encounter (Signed)
Called pt to schedule appt, no answer and no VM to leave message. I will try to call later.

## 2016-02-22 ENCOUNTER — Telehealth: Payer: Self-pay | Admitting: Cardiology

## 2016-02-22 NOTE — Telephone Encounter (Signed)
Medication samples have been provided to the patient.  Drug name: eliquis 5mg   Qty: 3 boxes  LOT: [id number]  Exp.Date: 03/2018  Samples left at front desk for patient pick-up. Patient notified.  Sheral Apley M 2:22 PM 02/22/2016

## 2016-02-22 NOTE — Telephone Encounter (Signed)
New Message ° °Patient calling the office for samples of medication: ° ° °1.  What medication and dosage are you requesting samples for? Eliquis 5mg  ° °2.  Are you currently out of this medication? yes ° ° ° °

## 2016-03-02 ENCOUNTER — Ambulatory Visit (INDEPENDENT_AMBULATORY_CARE_PROVIDER_SITE_OTHER): Payer: Medicare HMO | Admitting: Physician Assistant

## 2016-03-02 ENCOUNTER — Encounter: Payer: Self-pay | Admitting: Physician Assistant

## 2016-03-02 VITALS — BP 128/78 | HR 58 | Temp 97.5°F | Resp 14 | Ht 72.0 in | Wt 217.0 lb

## 2016-03-02 DIAGNOSIS — I480 Paroxysmal atrial fibrillation: Secondary | ICD-10-CM

## 2016-03-02 DIAGNOSIS — Z0000 Encounter for general adult medical examination without abnormal findings: Secondary | ICD-10-CM

## 2016-03-02 DIAGNOSIS — M109 Gout, unspecified: Secondary | ICD-10-CM | POA: Diagnosis not present

## 2016-03-02 DIAGNOSIS — R739 Hyperglycemia, unspecified: Secondary | ICD-10-CM | POA: Diagnosis not present

## 2016-03-02 DIAGNOSIS — I10 Essential (primary) hypertension: Secondary | ICD-10-CM | POA: Diagnosis not present

## 2016-03-02 DIAGNOSIS — Z23 Encounter for immunization: Secondary | ICD-10-CM

## 2016-03-02 LAB — LIPID PANEL
CHOLESTEROL: 144 mg/dL (ref 0–200)
HDL: 36.3 mg/dL — ABNORMAL LOW (ref >39.00)
LDL CALC: 87 mg/dL (ref 0–99)
NonHDL: 107.73
Total CHOL/HDL Ratio: 4
Triglycerides: 104 mg/dL (ref 0.0–149.0)
VLDL: 20.8 mg/dL (ref 0.0–40.0)

## 2016-03-02 LAB — HEPATIC FUNCTION PANEL
ALBUMIN: 3.8 g/dL (ref 3.5–5.2)
ALT: 11 U/L (ref 0–53)
AST: 16 U/L (ref 0–37)
Alkaline Phosphatase: 61 U/L (ref 39–117)
BILIRUBIN DIRECT: 0.3 mg/dL (ref 0.0–0.3)
TOTAL PROTEIN: 6.4 g/dL (ref 6.0–8.3)
Total Bilirubin: 1.5 mg/dL — ABNORMAL HIGH (ref 0.2–1.2)

## 2016-03-02 LAB — URIC ACID: URIC ACID, SERUM: 7.1 mg/dL (ref 4.0–7.8)

## 2016-03-02 LAB — BASIC METABOLIC PANEL
BUN: 15 mg/dL (ref 6–23)
CALCIUM: 9.4 mg/dL (ref 8.4–10.5)
CHLORIDE: 106 meq/L (ref 96–112)
CO2: 32 meq/L (ref 19–32)
CREATININE: 1.17 mg/dL (ref 0.40–1.50)
GFR: 64.58 mL/min (ref >60.00)
Glucose, Bld: 67 mg/dL — ABNORMAL LOW (ref 70–99)
Potassium: 4.1 mEq/L (ref 3.5–5.1)
Sodium: 144 mEq/L (ref 135–145)

## 2016-03-02 LAB — HEMOGLOBIN A1C: Hgb A1c MFr Bld: 5.6 % (ref 4.6–6.5)

## 2016-03-02 MED ORDER — HYDROCODONE-ACETAMINOPHEN 10-325 MG PO TABS: 1.0000 | ORAL_TABLET | Freq: Four times a day (QID) | ORAL | 0 refills | Status: DC | PRN

## 2016-03-02 MED ORDER — PREDNISONE 10 MG PO TABS: 20.0000 mg | ORAL_TABLET | Freq: Every day | ORAL | 0 refills | Status: DC

## 2016-03-02 MED FILL — predniSONE 10 MG TABS: 10 | 3 days supply | Qty: 6 | Fill #0

## 2016-03-02 MED FILL — HYDROCODON-APAP 10-325: 10-325 | 25 days supply | Qty: 100 | Fill #0

## 2016-03-02 NOTE — Progress Notes (Signed)
Subjective:   [name] Woods is a 76 y.o. male who presents for Medicare Annual/Subsequent preventive examination. Has had recent follow-up of chronic medical issues. Is fasting for labs today. Denies acute concerns. Body mass index is 29.43 kg/m.  Review of Systems:  Review of Systems  Constitutional: Negative for fever and weight loss.  HENT: Negative for ear discharge, ear pain, hearing loss and tinnitus.   Eyes: Negative for blurred vision, double vision, photophobia and pain.  Respiratory: Negative for cough and shortness of breath.   Cardiovascular: Negative for chest pain and palpitations.  Gastrointestinal: Negative for abdominal pain, blood in stool, constipation, diarrhea, heartburn, melena, nausea and vomiting.  Genitourinary: Negative for dysuria, flank pain, frequency, hematuria and urgency.  Musculoskeletal: Negative for falls.  Neurological: Negative for dizziness, loss of consciousness and headaches.  Endo/Heme/Allergies: Negative for environmental allergies.  Psychiatric/Behavioral: Negative for depression, hallucinations, substance abuse and suicidal ideas. The patient is not nervous/anxious and does not have insomnia.    Objective:    Vitals: BP 128/78   Pulse (!) 58   Temp 97.5 F (36.4 C) (Oral)   Resp 14   Ht 6' (1.829 m)   Wt 217 lb (98.4 kg)   SpO2 98%   BMI 29.43 kg/m   Body mass index is 29.43 kg/m.  Tobacco History  Smoking Status  . Never Smoker  Smokeless Tobacco  . Never Used     Counseling given: No   Past Medical History:  Diagnosis Date  . Ankylosing spondylitis ([place])   . Arthritis   . CAD (coronary artery disease) [id number]   30% mid LAD lesion on cardiac cath  . Chicken pox   . Cholelithiasis   . GERD (gastroesophageal reflux disease)   . [place] measles   . Hiatal hernia   . Hypertension   . Nephrolithiasis   . Persistent atrial fibrillation ([place])   . Sleep apnea   . 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 Ventricular  hypertrophy    Past Surgical History:  Procedure Laterality Date  . CARDIAC CATHETERIZATION  03/2012   30% mid LAD lesion otherwise normal cors, LVEF 65%  . CHOLECYSTECTOMY    . ELBOW ARTHROPLASTY     Left  . LEFT AND RIGHT HEART CATHETERIZATION WITH CORONARY ANGIOGRAM N/A 03/24/2012   Procedure: LEFT AND RIGHT HEART CATHETERIZATION WITH CORONARY ANGIOGRAM;  Surgeon: Sherren Mocha, MD;  Location: [facility] CATH LAB;  Service: Cardiovascular;  Laterality: N/A;  . LEG FLUID REMOVAL RIGHT    . TONSILLECTOMY     Family History  Problem Relation Age of Onset  . Heart disease Father 69    Deceased  . Heart attack Father 27  . Heart disease Brother 5    "Died in his sleep"  . Arthritis/Rheumatoid Mother     Deceased-86  . Hyperlipidemia Brother   . Stroke Brother 68    Deceased  . Other Sister     Estate agent in [place]  . Other Sister     MVA  . Other Brother     Carjacked-killed  . Dementia Paternal Grandmother   . Cancer Paternal Uncle   . Healthy Brother   . Healthy Son     X3  . Healthy Daughter     x1  . Colon cancer Neg Hx    History  Sexual Activity  . Sexual activity: Not on file    Outpatient Encounter Prescriptions as of 03/02/2016  Medication Sig  . allopurinol (ZYLOPRIM) 100 MG tablet Take  1 tablet (100 mg total) by mouth daily.  Marland Kitchen apixaban (ELIQUIS) 5 MG TABS tablet Take 1 tablet (5 mg total) by mouth 2 (two) times daily.  Marland Kitchen diltiazem (CARDIZEM CD) 180 MG 24 hr capsule Take 1 capsule (180 mg total) by mouth daily.  Marland Kitchen escitalopram (LEXAPRO) 10 MG tablet Take 1 tablet (10 mg total) by mouth daily.  . furosemide (LASIX) 20 MG tablet Take 1 tablet (20 mg total) by mouth daily.  Marland Kitchen HYDROcodone-acetaminophen (NORCO) 10-325 MG tablet Take 1 tablet by mouth every 6 (six) hours as needed. Can fill on or after 12/29/15  . Loperamide HCl (IMODIUM PO) Take by mouth as needed. Reported on 05/10/2015  . metoprolol succinate (TOPROL-XL) 100 MG 24 hr tablet TAKE 1 TABLET BY MOUTH 2 TIMES  DAILY.  Marland Kitchen omeprazole (PRILOSEC) 40 MG capsule Take 1 capsule (40 mg total) by mouth daily.  . potassium chloride SA (K-DUR,KLOR-CON) 20 MEQ tablet Take 1 tablet (20 mEq total) by mouth daily.  . [DISCONTINUED] benzonatate (TESSALON) 100 MG capsule Take 1 capsule (100 mg total) by mouth 2 (two) times daily.  . [DISCONTINUED] doxycycline (VIBRAMYCIN) 100 MG capsule Take 1 capsule (100 mg total) by mouth 2 (two) times daily.   No facility-administered encounter medications on file as of 03/02/2016.     Activities of Daily Living In your present state of health, do you have any difficulty performing the following activities: 03/02/2016 09/12/2015  Hearing? N N  Vision? N N  Difficulty concentrating or making decisions? N N  Walking or climbing stairs? Y N  Dressing or bathing? N N  Doing errands, shopping? N N  Some recent data might be hidden    Patient Care Team: Brunetta Jeans, PA-C as PCP - General (Physician Assistant) Kathie Rhodes, MD as Consulting Physician (Urology) Minus Breeding, MD as Consulting Physician (Cardiology)   Assessment:    (1) Medicare Wellness, Subsequent (2) Hypertension (3) Paroxysmal A Fib (4) Gout  Exercise Activities and Dietary recommendations Current Exercise Habits: Home exercise routine, Type of exercise: walking, Time (Minutes): 30, Frequency (Times/Week): 3, Weekly Exercise (Minutes/Week): 90, Intensity: Mild  Goals    None     Fall Risk Fall Risk  03/02/2016 09/12/2015 06/30/2014 05/03/2014  Falls in the past year? No No No No   Depression Screen PHQ 2/9 Scores 03/02/2016 03/02/2016 09/12/2015 06/30/2014  PHQ - 2 Score 0 0 0 0  PHQ- 9 Score - 0 0 -    Cognitive Function MMSE - Mini Mental State Exam 03/02/2016  Orientation to time 5  Orientation to Place 5  Registration 3  Attention/ Calculation 5  Recall 3  Language- name 2 objects 2  Language- repeat 1  Language- follow 3 step command 3  Language- read & follow direction 1  Write a sentence 1    Copy design 1  Total score 30       Immunization History  Administered Date(s) Administered  . Influenza Split 11/30/2011  . Influenza Whole 11/10/2009  . Influenza, High Dose Seasonal PF 12/02/2012, 11/07/2013  . Influenza,inj,Quad PF,36+ Mos 10/25/2014, 11/08/2015  . Pneumococcal Conjugate-13 05/03/2014  . Td 01/30/2004  . Tdap 05/03/2014   Screening Tests Health Maintenance  Topic Date Due  . PNA vac Low Risk Adult (2 of 2 - PPSV23) 05/03/2015  . ZOSTAVAX  03/02/2017 (Originally 02/10/2001)  . COLONOSCOPY  05/10/2018  . DTaP/Tdap/Td (2 - Td) 05/02/2024  . TETANUS/TDAP  05/02/2024  . INFLUENZA VACCINE  Completed  Plan:    (1) During the course of the visit the patient was educated and counseled about the following appropriate screening and preventive services:   Vaccines to include Pneumoccal, Influenza, Hepatitis B, Td, Zostavax, HCV  Cardiovascular Disease  Colorectal cancer screening  Diabetes screening  Prostate Cancer Screening  Glaucoma screening  Nutrition counseling   (2) Stable today. Continue current regimen. F/U with Cardiology as scheduled. Labs today (3) Followed by Cardiology. Continue care as directed. Has appt scheduled. (4) Is taking allopurinol as directed. Will check uric acid levels today.   Patient Instructions (the written plan) was given to the patient.    Raiford Noble [place], [place]  03/02/2016

## 2016-03-02 NOTE — Progress Notes (Signed)
Pre visit review using our clinic review tool, if applicable. No additional management support is needed unless otherwise documented below in the visit note. 

## 2016-03-02 NOTE — Patient Instructions (Signed)
Please go to the lab for blood work. I will call you with your results.  Please continue chronic medications as directed. Use the prednisone sent to local pharmacy to help with gout flare.  We will alter medications based on results.  Follow-up in 1 month.    Preventive Care 75 Years and Older, Male Preventive care refers to lifestyle choices and visits with your health care provider that can promote health and wellness. What does preventive care include?  A yearly physical exam. This is also called an annual well check.  Dental exams once or twice a year.  Routine eye exams. Ask your health care provider how often you should have your eyes checked.  Personal lifestyle choices, including:  Daily care of your teeth and gums.  Regular physical activity.  Eating a healthy diet.  Avoiding tobacco and drug use.  Limiting alcohol use.  Practicing safe sex.  Taking low doses of aspirin every day.  Taking vitamin and mineral supplements as recommended by your health care provider. What happens during an annual well check? The services and screenings done by your health care provider during your annual well check will depend on your age, overall health, lifestyle risk factors, and family history of disease. Counseling  Your health care provider may ask you questions about your:  Alcohol use.  Tobacco use.  Drug use.  Emotional well-being.  Home and relationship well-being.  Sexual activity.  Eating habits.  History of falls.  Memory and ability to understand (cognition).  Work and work Statistician. Screening  You may have the following tests or measurements:  Height, weight, and BMI.  Blood pressure.  Lipid and cholesterol levels. These may be checked every 5 years, or more frequently if you are over 20 years old.  Skin check.  Lung cancer screening. You may have this screening every year starting at age 42 if you have a 30-pack-year history of smoking  and currently smoke or have quit within the past 15 years.  Fecal occult blood test (FOBT) of the stool. You may have this test every year starting at age 75.  Flexible sigmoidoscopy or colonoscopy. You may have a sigmoidoscopy every 5 years or a colonoscopy every 10 years starting at age 89.  Prostate cancer screening. Recommendations will vary depending on your family history and other risks.  Hepatitis C blood test.  Hepatitis B blood test.  Sexually transmitted disease (STD) testing.  Diabetes screening. This is done by checking your blood sugar (glucose) after you have not eaten for a while (fasting). You may have this done every 1-3 years.  Abdominal aortic aneurysm (AAA) screening. You may need this if you are a current or former smoker.  Osteoporosis. You may be screened starting at age 42 if you are at high risk. Talk with your health care provider about your test results, treatment options, and if necessary, the need for more tests. Vaccines  Your health care provider may recommend certain vaccines, such as:  Influenza vaccine. This is recommended every year.  Tetanus, diphtheria, and acellular pertussis (Tdap, Td) vaccine. You may need a Td booster every 10 years.  Varicella vaccine. You may need this if you have not been vaccinated.  Zoster vaccine. You may need this after age 56.  Measles, mumps, and rubella (MMR) vaccine. You may need at least one dose of MMR if you were born in 1957 or later. You may also need a second dose.  Pneumococcal 13-valent conjugate (PCV13) vaccine. One dose is  recommended after age 48.  Pneumococcal polysaccharide (PPSV23) vaccine. One dose is recommended after age 82.  Meningococcal vaccine. You may need this if you have certain conditions.  Hepatitis A vaccine. You may need this if you have certain conditions or if you travel or work in places where you may be exposed to hepatitis A.  Hepatitis B vaccine. You may need this if you  have certain conditions or if you travel or work in places where you may be exposed to hepatitis B.  Haemophilus influenzae type b (Hib) vaccine. You may need this if you have certain risk factors. Talk to your health care provider about which screenings and vaccines you need and how often you need them. This information is not intended to replace advice given to you by your health care provider. Make sure you discuss any questions you have with your health care provider. Document Released: 02/11/2015 Document Revised: 10/05/2015 Document Reviewed: 11/16/2014 Elsevier Interactive Patient Education  2017 [employer].

## 2016-03-05 ENCOUNTER — Other Ambulatory Visit: Payer: Self-pay | Admitting: Physician Assistant

## 2016-03-07 ENCOUNTER — Other Ambulatory Visit: Payer: Self-pay | Admitting: Physician Assistant

## 2016-03-07 DIAGNOSIS — I2510 Atherosclerotic heart disease of native coronary artery without angina pectoris: Secondary | ICD-10-CM

## 2016-03-07 MED ORDER — ATORVASTATIN CALCIUM 10 MG PO TABS: 10.0000 mg | ORAL_TABLET | Freq: Every day | ORAL | 0 refills | Status: DC

## 2016-03-12 ENCOUNTER — Telehealth: Payer: Self-pay | Admitting: Cardiology

## 2016-03-12 NOTE — Telephone Encounter (Signed)
New message    Pt calling about Eliquis-he is asking about samples of this medication.

## 2016-03-12 NOTE — Telephone Encounter (Signed)
Attempted to reach patient. "Wireless caller unavailable. Please try your call again later."

## 2016-03-13 NOTE — Telephone Encounter (Signed)
No answer when dialed. 

## 2016-03-14 NOTE — Telephone Encounter (Signed)
I get same msg when trying to reach him today. Let him know, if he calls back, that I do have samples at the [facility] office for him. Thanks.  Medication Samples have been provided to the patient.  Drug name: Eliquis 5mg Qty: 28LOT: [id number].Date: 03/2018

## 2016-03-14 NOTE — Telephone Encounter (Signed)
Follow Up    Pt is calling back about the Eloquis samples. He stated he could not get to the phone until today.

## 2016-03-15 NOTE — Telephone Encounter (Signed)
Patient aware samples are at the front desk for pick up  

## 2016-03-15 NOTE — Telephone Encounter (Signed)
Unable to reach pt or leave a message  

## 2016-03-28 ENCOUNTER — Telehealth: Payer: Self-pay | Admitting: Physician Assistant

## 2016-03-28 ENCOUNTER — Telehealth: Payer: Self-pay | Admitting: Cardiology

## 2016-03-28 MED ORDER — APIXABAN 5 MG PO TABS: 5.0000 mg | ORAL_TABLET | Freq: Two times a day (BID) | ORAL | 0 refills | Status: DC

## 2016-03-28 NOTE — Telephone Encounter (Signed)
Indication for chronic opioid:  Medication and dose: Hydrocodone-Acetaminophen 10/325 mg # pills per month: #100 03/02/16 Last UDS date: 04/26/15 low risk Pain contract signed (Y/N): Yes 05/03/14 Date narcotic database last reviewed (include red flags):

## 2016-03-28 NOTE — Telephone Encounter (Signed)
Pt states that he needs a refill on HYDROcodone

## 2016-03-28 NOTE — Telephone Encounter (Signed)
New Message    Patient calling the office for samples of medication:   1.  What medication and dosage are you requesting samples for?apixaban (ELIQUIS) 5 MG TABS tablet  2.  Are you currently out of this medication? No. Still has some

## 2016-03-28 NOTE — Telephone Encounter (Signed)
Pt notified that samples are at the front desk 

## 2016-04-01 NOTE — Progress Notes (Signed)
04/02/2016 [name] Woods   1940/08/28  [id number]  Primary Physician Leeanne Rio, PA-C   HPI:  The patient is a 76 year old male, who is followed for persistent atrial fibrillation.His history is also notable for mild nonobstructive coronary disease by [facility] 03/24/2012. Ejection fraction at that time was mildly reduced at 45%. However, repeat 2-D echocardiogram 07/02/2013 revealed improved systolic function with an estimated ejection fraction of 55%.  He recently had a pulmonary workup and was found to have some coronary calcium.  He returns for follow up.  He reports he has had some chest discomfort. This has been the left side of his chest that seems to be more when he is lying down. He does do some burping and his wife says she hears his stomach gurgling. He wonders if it could be GI. It's under the left breast and not radiating. He does active work in Education officer, community and doesn't bring it on this. He doesn't describe associated symptoms such as nausea vomiting or diaphoresis. He mentions having a swollen vessel in his right leg above his ankle.  He has not had pain or tenderness with this.   Current Outpatient Prescriptions  Medication Sig Dispense Refill  . allopurinol (ZYLOPRIM) 100 MG tablet Take 1 tablet (100 mg total) by mouth daily. 90 tablet 1  . apixaban (ELIQUIS) 5 MG TABS tablet Take 1 tablet (5 mg total) by mouth 2 (two) times daily. 21 tablet 0  . atorvastatin (LIPITOR) 10 MG tablet Take 1 tablet (10 mg total) by mouth daily. 90 tablet 0  . diltiazem (CARDIZEM CD) 180 MG 24 hr capsule Take 1 capsule (180 mg total) by mouth daily. 90 capsule 2  . escitalopram (LEXAPRO) 10 MG tablet TAKE 1 TABLET (10 MG TOTAL) BY MOUTH DAILY. 90 tablet 1  . furosemide (LASIX) 20 MG tablet TAKE 1 TABLET (20 MG TOTAL) BY MOUTH DAILY. 90 tablet 1  . Loperamide HCl (IMODIUM PO) Take by mouth as needed. Reported on 05/10/2015    . metoprolol succinate (TOPROL-XL) 100 MG 24 hr  tablet TAKE 1 TABLET BY MOUTH 2 TIMES DAILY. 180 tablet 2  . omeprazole (PRILOSEC) 40 MG capsule Take 1 capsule (40 mg total) by mouth daily. 30 capsule 5  . potassium chloride SA (K-DUR,KLOR-CON) 20 MEQ tablet Take 1 tablet (20 mEq total) by mouth daily. 90 tablet 0  . predniSONE (DELTASONE) 10 MG tablet Take 2 tablets (20 mg total) by mouth daily with breakfast. (Patient taking differently: Take 20 mg by mouth as needed. ) 6 tablet 0  . HYDROcodone-acetaminophen (NORCO) 10-325 MG tablet Take 1 tablet by mouth every 6 (six) hours as needed. Can fill on or after 12/29/15 100 tablet 0   No current facility-administered medications for this visit.     Allergies  Allergen Reactions  . Coumadin [Warfarin]     GI bleeds   Past Medical History:  Diagnosis Date  . Ankylosing spondylitis ([place])   . Arthritis   . CAD (coronary artery disease) [id number]   30% mid LAD lesion on cardiac cath  . Chicken pox   . Cholelithiasis   . GERD (gastroesophageal reflux disease)   . [place] measles   . Hiatal hernia   . Hypertension   . Nephrolithiasis   . Persistent atrial fibrillation ([place])   . Sleep apnea   . 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 Ventricular hypertrophy    Past Surgical History:  Procedure Laterality Date  . CARDIAC CATHETERIZATION  03/2012   30% mid LAD lesion otherwise normal cors, LVEF 65%  . CHOLECYSTECTOMY    . ELBOW ARTHROPLASTY     Left  . LEFT AND RIGHT HEART CATHETERIZATION WITH CORONARY ANGIOGRAM N/A 03/24/2012   Procedure: LEFT AND RIGHT HEART CATHETERIZATION WITH CORONARY ANGIOGRAM;  Surgeon: Sherren Mocha, MD;  Location: [facility] CATH LAB;  Service: Cardiovascular;  Laterality: N/A;  . LEG FLUID REMOVAL RIGHT    . TONSILLECTOMY       ROS:  As stated in the HPI and negative for all other systems.  EXAM: Blood pressure (!) 154/90, pulse 73, height 6\' 1"  (1.854 m), weight 217 lb 9.6 oz (98.7 kg).  GENERAL:  Well appearing HEENT:  Pupils equal round and reactive, fundi not  visualized, oral mucosa unremarkable, poor dentition NECK:  No jugular venous distention, waveform within normal limits, carotid upstroke brisk and symmetric, no bruits, no thyromegaly LUNGS:  Clear to auscultation bilaterally CHEST:  Unremarkable HEART:  PMI not displaced or sustained,S1 and S2 within normal limits, no S3, no S4, no clicks, no rubs, no murmurs, irregular ABD:  Flat, positive bowel sounds normal in frequency in pitch, no bruits, no rebound, no guarding, no midline pulsatile mass, no hepatomegaly, no splenomegaly EXT:  2 plus pulses throughout, no edema, no cyanosis no clubbing  EKG:  Atrial fibrillation, rate 79, axis within normal limits, intervals within normal limits, poor anterior R-wave progression, cannot exclude old anteroseptal infarct, nonspecific ST-T wave changes.  Lab Results  Component Value Date   CHOL 144 03/02/2016   TRIG 104.0 03/02/2016   HDL 36.30 (L) 03/02/2016   LDLCALC 87 03/02/2016     ASSESSMENT AND PLAN:   CORONARY CALCIUM :  The patient does have some atypical chest pain.  I will plan a POET (Plain Old Exercise Treadmill) on meds to screen for obstructive CAD.    DYSPNEA:  Given this and his previous cardiomyopathy I will repeat an echocardiogram.    HTN:    Blood pressure is mild elevated.  This is unusual.  He will keep a BP diary and changes will be based on this.   ATRIAL FIB:   Mr. [name] has a CHA2DS2 - VASc score of 2.   The patient  tolerates this rhythm and rate control and anticoagulation. We will continue with the meds as listed.    DYSLIPIDEMIA:  He was given Lipitor to take but did not start this.  I did suggest a goal LDL of 70 and he will start this which was prescribed by Leeanne Rio, PA-C  Dezmen HochreinPA-C 04/02/2016 11:01 AM

## 2016-04-02 ENCOUNTER — Encounter: Payer: Self-pay | Admitting: Cardiology

## 2016-04-02 ENCOUNTER — Ambulatory Visit (INDEPENDENT_AMBULATORY_CARE_PROVIDER_SITE_OTHER): Payer: Medicare HMO | Admitting: Cardiology

## 2016-04-02 VITALS — BP 154/90 | HR 73 | Ht 73.0 in | Wt 217.6 lb

## 2016-04-02 DIAGNOSIS — R072 Precordial pain: Secondary | ICD-10-CM | POA: Diagnosis not present

## 2016-04-02 DIAGNOSIS — I429 Cardiomyopathy, unspecified: Secondary | ICD-10-CM | POA: Diagnosis not present

## 2016-04-02 DIAGNOSIS — R0600 Dyspnea, unspecified: Secondary | ICD-10-CM

## 2016-04-02 MED ORDER — HYDROCODONE-ACETAMINOPHEN 10-325 MG PO TABS
1.0000 | ORAL_TABLET | Freq: Four times a day (QID) | ORAL | 0 refills | Status: DC | PRN
Start: 2016-04-02 — End: 2016-05-01

## 2016-04-02 MED FILL — HYDROCODON-APAP 10-325: 10-325 | 25 days supply | Qty: 100 | Fill #0

## 2016-04-02 NOTE — Telephone Encounter (Signed)
Ready for pick up

## 2016-04-02 NOTE — Patient Instructions (Signed)
Medication Instructions:  Continue current medications  Labwork: None Ordered  Testing/Procedures: Your physician has requested that you have an echocardiogram. Echocardiography is a painless test that uses sound waves to create images of your heart. It provides your doctor with information about the size and shape of your heart and how well your heart's chambers and valves are working. This procedure takes approximately one hour. There are no restrictions for this procedure.  Your physician has requested that you have an exercise tolerance test. For further information please visit [email or web address]. Please also follow instruction sheet, as given.   Follow-Up: Your physician wants you to follow-up in: 1 Year. You will receive a reminder letter in the mail two months in advance. If you don't receive a letter, please call our office to schedule the follow-up appointment.   Any Other Special Instructions Will Be Listed Below (If Applicable).   If you need a refill on your cardiac medications before your next appointment, please call your pharmacy.

## 2016-04-02 NOTE — Telephone Encounter (Signed)
Pt has been made aware. 

## 2016-04-02 NOTE — Telephone Encounter (Signed)
Pt calling checking status on this, please advise

## 2016-04-06 ENCOUNTER — Telehealth (HOSPITAL_COMMUNITY): Payer: Self-pay

## 2016-04-06 NOTE — Telephone Encounter (Signed)
Encounter complete. 

## 2016-04-10 ENCOUNTER — Encounter (HOSPITAL_COMMUNITY): Payer: Self-pay | Admitting: *Deleted

## 2016-04-10 ENCOUNTER — Ambulatory Visit (HOSPITAL_COMMUNITY)
Admission: RE | Admit: 2016-04-10 | Discharge: 2016-04-10 | Disposition: A | Discharge status: Home / Self Care | Payer: Medicare HMO | Source: Ambulatory Visit | Attending: Cardiology | Admitting: Cardiology

## 2016-04-10 DIAGNOSIS — I429 Cardiomyopathy, unspecified: Secondary | ICD-10-CM | POA: Diagnosis not present

## 2016-04-10 DIAGNOSIS — R0600 Dyspnea, unspecified: Secondary | ICD-10-CM | POA: Diagnosis not present

## 2016-04-10 DIAGNOSIS — I4891 Unspecified atrial fibrillation: Secondary | ICD-10-CM | POA: Insufficient documentation

## 2016-04-10 LAB — EXERCISE TOLERANCE TEST
CHL CUP MPHR: 145 {beats}/min
CHL RATE OF PERCEIVED EXERTION: 19
CSEPED: 5 min
Estimated workload: 7 METS
Exercise duration (sec): 5 s
Peak HR: 173 {beats}/min
Percent HR: 119 %
Rest HR: 65 {beats}/min

## 2016-04-10 NOTE — Progress Notes (Signed)
Reviewed and ok to leave by Dr. Percival Spanish

## 2016-04-16 ENCOUNTER — Ambulatory Visit (HOSPITAL_COMMUNITY): Payer: Medicare HMO | Attending: Cardiovascular Disease

## 2016-04-16 ENCOUNTER — Other Ambulatory Visit: Payer: Self-pay

## 2016-04-16 DIAGNOSIS — I2510 Atherosclerotic heart disease of native coronary artery without angina pectoris: Secondary | ICD-10-CM | POA: Insufficient documentation

## 2016-04-16 DIAGNOSIS — I481 Persistent atrial fibrillation: Secondary | ICD-10-CM | POA: Diagnosis not present

## 2016-04-16 DIAGNOSIS — I082 Rheumatic disorders of both aortic and tricuspid valves: Secondary | ICD-10-CM | POA: Diagnosis not present

## 2016-04-16 DIAGNOSIS — I313 Pericardial effusion (noninflammatory): Secondary | ICD-10-CM | POA: Diagnosis not present

## 2016-04-16 DIAGNOSIS — I10 Essential (primary) hypertension: Secondary | ICD-10-CM | POA: Insufficient documentation

## 2016-04-16 DIAGNOSIS — I429 Cardiomyopathy, unspecified: Secondary | ICD-10-CM | POA: Insufficient documentation

## 2016-04-16 DIAGNOSIS — R0600 Dyspnea, unspecified: Secondary | ICD-10-CM | POA: Insufficient documentation

## 2016-04-19 ENCOUNTER — Encounter: Payer: Self-pay | Admitting: Cardiology

## 2016-04-23 DIAGNOSIS — R739 Hyperglycemia, unspecified: Secondary | ICD-10-CM | POA: Insufficient documentation

## 2016-04-23 DIAGNOSIS — Z0000 Encounter for general adult medical examination without abnormal findings: Secondary | ICD-10-CM | POA: Insufficient documentation

## 2016-04-24 ENCOUNTER — Ambulatory Visit: Payer: Medicare HMO | Admitting: Cardiology

## 2016-04-30 ENCOUNTER — Encounter: Payer: Self-pay | Admitting: Physician Assistant

## 2016-05-01 MED ORDER — HYDROCODONE-ACETAMINOPHEN 10-325 MG PO TABS: 1.0000 | ORAL_TABLET | Freq: Four times a day (QID) | ORAL | 0 refills | Status: DC | PRN

## 2016-05-01 NOTE — Telephone Encounter (Signed)
Ok to pick up Rx. Needs to get blood work at pickup. UDS also needed.

## 2016-05-01 NOTE — Telephone Encounter (Signed)
Last OV 02/09/16 (sinus) Hydrocodone last filled 04/02/16 #100 with 0   CSC, Low Risk, UDS DUE

## 2016-05-02 MED FILL — HYDROCODON-APAP 10-325: 10-325 | 22 days supply | Qty: 90 | Fill #0

## 2016-05-03 ENCOUNTER — Telehealth: Payer: Self-pay | Admitting: Cardiology

## 2016-05-03 NOTE — Telephone Encounter (Signed)
New Message    Patient calling the office for samples of medication:   1.  What medication and dosage are you requesting samples for? apixaban (ELIQUIS) 5 MG TABS tablet  2.  Are you currently out of this medication? Has a few left

## 2016-05-03 NOTE — Telephone Encounter (Signed)
Medication samples have been provided to the patient.  Drug name: Eliquis 5mg   Qty: 28 tablets LOT: [id number]  Exp.Date: 4/20  Samples left at front desk for patient pick-up. Patient notified.

## 2016-05-04 DIAGNOSIS — Z79891 Long term (current) use of opiate analgesic: Secondary | ICD-10-CM | POA: Diagnosis not present

## 2016-05-09 NOTE — Progress Notes (Signed)
05/10/2016 [name] Woods   December 31, 1940  [id number]  Primary Physician [name] Rio, PA-C   HPI:  The patient is a 76 year old male, who is followed for persistent atrial fibrillation.His history is also notable for mild nonobstructive coronary disease by [facility] 03/24/2012. Ejection fraction at that time was mildly reduced at 45%. However, repeat 2-D echocardiogram 07/02/2013 revealed improved systolic function with an estimated ejection fraction of 55%.   He was recently found to have coronary calcium on a CT.  The had a POET (Plain Old Exercise Treadmill) which demonstrated a poor exercise tolerance.  He did have some ST changes.     He also had an echo which was normal.  He still getting tired when he walks for instance to the mailbox. His heart rate will be faster. Little short of breath. He sleeping more during the day. He's not describing chest pressure, neck or arm discomfort. He's not noticing palpitations, presyncope or syncope.  I personally reviewed the stress test results for this appointment.  Current Outpatient Prescriptions  Medication Sig Dispense Refill  . allopurinol (ZYLOPRIM) 100 MG tablet Take 1 tablet (100 mg total) by mouth daily. 90 tablet 1  . apixaban (ELIQUIS) 5 MG TABS tablet Take 1 tablet (5 mg total) by mouth 2 (two) times daily. 21 tablet 0  . atorvastatin (LIPITOR) 10 MG tablet Take 1 tablet (10 mg total) by mouth daily. 90 tablet 0  . diltiazem (CARDIZEM CD) 180 MG 24 hr capsule Take 1 capsule (180 mg total) by mouth daily. 90 capsule 2  . escitalopram (LEXAPRO) 10 MG tablet TAKE 1 TABLET (10 MG TOTAL) BY MOUTH DAILY. 90 tablet 1  . furosemide (LASIX) 20 MG tablet TAKE 1 TABLET (20 MG TOTAL) BY MOUTH DAILY. 90 tablet 1  . HYDROcodone-acetaminophen (NORCO) 10-325 MG tablet Take 1 tablet by mouth every 6 (six) hours as needed. Can fill on or after 12/29/15 90 tablet 0  . Loperamide HCl (IMODIUM PO) Take by mouth as needed. Reported on 05/10/2015    .  metoprolol succinate (TOPROL-XL) 100 MG 24 hr tablet TAKE 1 TABLET BY MOUTH 2 TIMES DAILY. 180 tablet 2  . omeprazole (PRILOSEC) 40 MG capsule Take 1 capsule (40 mg total) by mouth daily. 30 capsule 5  . potassium chloride SA (K-DUR,KLOR-CON) 20 MEQ tablet Take 1 tablet (20 mEq total) by mouth daily. 90 tablet 0  . predniSONE (DELTASONE) 10 MG tablet Take 2 tablets (20 mg total) by mouth daily with breakfast. (Patient taking differently: Take 20 mg by mouth as needed. ) 6 tablet 0   No current facility-administered medications for this visit.     Allergies  Allergen Reactions  . Coumadin [Warfarin]     GI bleeds   Past Medical History:  Diagnosis Date  . Ankylosing spondylitis ([place])   . Arthritis   . CAD (coronary artery disease) 5974,1638   30% mid LAD lesion on cardiac cath  . Chicken pox   . Cholelithiasis   . GERD (gastroesophageal reflux disease)   . [place] measles   . Hiatal hernia   . Hypertension   . Nephrolithiasis   . Persistent atrial fibrillation ([place])   . Sleep apnea   . 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 Ventricular hypertrophy    Past Surgical History:  Procedure Laterality Date  . CARDIAC CATHETERIZATION  03/2012   30% mid LAD lesion otherwise normal cors, LVEF 65%  . CHOLECYSTECTOMY    . ELBOW ARTHROPLASTY  Left  . LEFT AND RIGHT HEART CATHETERIZATION WITH CORONARY ANGIOGRAM N/A 03/24/2012   Procedure: LEFT AND RIGHT HEART CATHETERIZATION WITH CORONARY ANGIOGRAM;  Surgeon: Sherren Mocha, MD;  Location: [facility] CATH LAB;  Service: Cardiovascular;  Laterality: N/A;  . LEG FLUID REMOVAL RIGHT    . TONSILLECTOMY       ROS:  As stated in the HPI and negative for all other systems.  EXAM: Blood pressure (!) 157/90, pulse 81, height 6\' 1"  (1.854 m), weight 214 lb 6.4 oz (97.3 kg).  GENERAL:  Well appearing and in no distress HEENT:  Pupils equal round and reactive, fundi not visualized, oral mucosa unremarkable, poor dentition NECK:  No jugular venous  distention, waveform within normal limits, carotid upstroke brisk and symmetric, no bruits, no thyromegaly LUNGS:  Clear to auscultation bilaterally CHEST:  Unremarkable HEART:  PMI not displaced or sustained,S1 and S2 within normal limits, no S3,  no clicks, no rubs, no murmurs, irregular, no change from previous.  ABD:  Flat, positive bowel sounds normal in frequency in pitch, no bruits, no rebound, no guarding, no midline pulsatile mass, no hepatomegaly, no splenomegaly EXT:  2 plus pulses throughout, no edema, no cyanosis no clubbing  EKG:  NA  Lab Results  Component Value Date   CHOL 144 03/02/2016   TRIG 104.0 03/02/2016   HDL 36.30 (L) 03/02/2016   LDLCALC 87 03/02/2016   . Lab Results  Component Value Date   TSH 2.352 06/16/2015    ASSESSMENT AND PLAN:   CORONARY CALCIUM :    The patient had the stress test result as above.  He has symptoms and I did review the test and it was somewhat difficult to interpret because of artifact. I think the posttest probability of obstructive coronary disease is moderate and he needs further imaging with a Lexiscan Myoview.  DYSPNEA:  EF was normal.  The etiology of this is still not clear. I am going to check a stress test as above and also a 24-hour Holter as he does describe some increased heart rate which might be contributing.   HTN:    Blood pressure is mildly elevated again.   I will hold off on changing his meds for now as it might need to increase his Cardizem or metoprolol based on his Holter results.  ATRIAL FIB:   [name] has a CHA2DS2 - VASc score of 2.   The patient  tolerates this rhythm and rate control and anticoagulation.  This will be looked at as above.  DYSLIPIDEMIA:  He has started taking Lipitor and we can recheck his lipid profile when he returns.   [name] HochreinPA-C 05/10/2016 10:11 PM

## 2016-05-10 ENCOUNTER — Ambulatory Visit (INDEPENDENT_AMBULATORY_CARE_PROVIDER_SITE_OTHER): Payer: Medicare HMO | Admitting: Cardiology

## 2016-05-10 ENCOUNTER — Encounter: Payer: Self-pay | Admitting: Cardiology

## 2016-05-10 VITALS — BP 157/90 | HR 81 | Ht 73.0 in | Wt 214.4 lb

## 2016-05-10 DIAGNOSIS — R9439 Abnormal result of other cardiovascular function study: Secondary | ICD-10-CM | POA: Diagnosis not present

## 2016-05-10 DIAGNOSIS — R931 Abnormal findings on diagnostic imaging of heart and coronary circulation: Secondary | ICD-10-CM | POA: Diagnosis not present

## 2016-05-10 DIAGNOSIS — R0602 Shortness of breath: Secondary | ICD-10-CM | POA: Diagnosis not present

## 2016-05-10 DIAGNOSIS — I480 Paroxysmal atrial fibrillation: Secondary | ICD-10-CM | POA: Diagnosis not present

## 2016-05-10 NOTE — Patient Instructions (Signed)
Medication Instructions:  Continue current medications  Labwork: None Ordered  Testing/Procedures: Your physician has requested that you have a lexiscan myoview. For further information please visit [email or web address]. Please follow instruction sheet, as given.  Your physician has recommended that you wear a 24 hour holter monitor. Holter monitors are medical devices that record the heart's electrical activity. Doctors most often use these monitors to diagnose arrhythmias. Arrhythmias are problems with the speed or rhythm of the heartbeat. The monitor is a small, portable device. You can wear one while you do your normal daily activities. This is usually used to diagnose what is causing palpitations/syncope (passing out).  Follow-Up: Your physician recommends that you schedule a follow-up appointment in: After Test   Any Other Special Instructions Will Be Listed Below (If Applicable).   If you need a refill on your cardiac medications before your next appointment, please call your pharmacy.

## 2016-05-12 ENCOUNTER — Other Ambulatory Visit: Payer: Self-pay | Admitting: Physician Assistant

## 2016-05-12 DIAGNOSIS — I2510 Atherosclerotic heart disease of native coronary artery without angina pectoris: Secondary | ICD-10-CM

## 2016-05-15 ENCOUNTER — Telehealth (HOSPITAL_COMMUNITY): Payer: Self-pay

## 2016-05-15 NOTE — Telephone Encounter (Signed)
Encounter complete. 

## 2016-05-16 ENCOUNTER — Other Ambulatory Visit: Payer: Medicare HMO

## 2016-05-17 ENCOUNTER — Inpatient Hospital Stay (HOSPITAL_COMMUNITY): Admission: RE | Admit: 2016-05-17 | Payer: Medicare HMO | Source: Ambulatory Visit

## 2016-05-29 ENCOUNTER — Telehealth: Payer: Self-pay | Admitting: Physician Assistant

## 2016-05-29 NOTE — Telephone Encounter (Signed)
Pt needs refill on HYDROcodone ° °

## 2016-05-29 NOTE — Telephone Encounter (Signed)
Indication for chronic opioid: Ankylosing Spondylitis Medication and dose: Hydrocodone-Acetaminophen 10/325 # pills per month: #90 Last UDS date: 04/26/15 Low risk Pain contract signed (Y/N): Yes, 05/03/14 Date narcotic database last reviewed (include red flags):   Please advise

## 2016-05-30 ENCOUNTER — Encounter: Payer: Self-pay | Admitting: Emergency Medicine

## 2016-05-30 MED ORDER — HYDROCODONE-ACETAMINOPHEN 10-325 MG PO TABS: 1.0000 | ORAL_TABLET | Freq: Four times a day (QID) | ORAL | 0 refills | Status: DC | PRN

## 2016-05-30 NOTE — Telephone Encounter (Signed)
Patient is aware that rx is ready for pick up at the front desk. He will need to sign a CSC and give a UDS

## 2016-05-30 NOTE — Telephone Encounter (Signed)
Patient needs updated [place] signed. Will need to do so at pickup. Also due for UDS at pickup. I will fill 90 tablets as we are working on cutting back dosage slowly, unless he is agreeable to go to Pain specialist.   FYI -- Dr. Birdie Riddle. Ok to continue chronic medication with slow wean?

## 2016-05-31 MED FILL — HYDROCODON-APAP 10-325: 10-325 | 23 days supply | Qty: 90 | Fill #0

## 2016-06-01 DIAGNOSIS — Z79891 Long term (current) use of opiate analgesic: Secondary | ICD-10-CM | POA: Diagnosis not present

## 2016-06-11 ENCOUNTER — Telehealth: Payer: Self-pay | Admitting: Cardiology

## 2016-06-11 NOTE — Telephone Encounter (Signed)
No samples at this time, advised patient

## 2016-06-11 NOTE — Telephone Encounter (Signed)
New Message    Patient calling the office for samples of medication:   1.  What medication and dosage are you requesting samples for? 5mg  Eliquis  2.  Are you currently out of this medication? almost

## 2016-06-19 ENCOUNTER — Telehealth: Payer: Self-pay | Admitting: Cardiology

## 2016-06-19 NOTE — Telephone Encounter (Signed)
Medication samples have been provided to the patient.  Drug name: Eliquis 5mg   Qty: 14 day supply LOT: [id number]  Exp.Date: 10/20  Samples left at front desk for patient pick-up. Patient notified.  Kawthar Ennen A Chandi Nicklin 4:58 PM 06/19/2016

## 2016-06-19 NOTE — Telephone Encounter (Signed)
Rep was here for lunch, should be samples in the closet

## 2016-06-19 NOTE — Telephone Encounter (Signed)
New message ° ° ° ° ° ° °Patient calling the office for samples of medication: ° ° °1.  What medication and dosage are you requesting samples for?  eliquis 5mg ° °2.  Are you currently out of this medication?  Almost out of medication ° ° °

## 2016-06-27 ENCOUNTER — Telehealth: Payer: Self-pay | Admitting: Physician Assistant

## 2016-06-27 NOTE — Telephone Encounter (Signed)
Indication for chronic opioid: Ankylosing Spondylitis/Arthritis Medication and dose: Hydrocodone-APAP 10/325mg  # pills per month: #90 on 05/30/16 Last UDS date: 05/04/16 low risk Pain contract signed (Y/N): Yes 05/30/16 Date narcotic database last reviewed (include red flags): 06/20/16  Please advise

## 2016-06-27 NOTE — Telephone Encounter (Signed)
Pt asking for a refill on hydrocodone

## 2016-06-28 ENCOUNTER — Encounter: Payer: Self-pay | Admitting: Emergency Medicine

## 2016-06-28 MED ORDER — HYDROCODONE-ACETAMINOPHEN 10-325 MG PO TABS: 1.0000 | ORAL_TABLET | Freq: Four times a day (QID) | ORAL | 0 refills | Status: DC | PRN

## 2016-06-28 NOTE — Telephone Encounter (Signed)
My chart message sent advising rx is ready for pick up.

## 2016-06-28 NOTE — Telephone Encounter (Signed)
Patient up-to-date on UDS and CSC.  Per new policy he needs appointment every 3 months to continue this medication. Will allow 45 tablets. Have him schedule follow-up with in 2 weeks, when I return to office as I will be out next week.

## 2016-06-29 NOTE — Telephone Encounter (Signed)
LMOVM advising patient the rx is ready for pick up at the front desk.

## 2016-07-03 NOTE — Telephone Encounter (Signed)
Faxed rx to the [facility] for patient

## 2016-07-06 ENCOUNTER — Other Ambulatory Visit: Payer: Self-pay | Admitting: Physician Assistant

## 2016-07-06 MED FILL — HYDROCODON-APAP 10-325: 10-325 | 11 days supply | Qty: 45 | Fill #0

## 2016-07-06 NOTE — Telephone Encounter (Signed)
Rx inactive thru [employer] due to medication unable to be faxed. Patient picked up rx today will take to local pharmacy. Patient is due for an appointment in 2 weeks before medication runs out.

## 2016-07-09 ENCOUNTER — Telehealth: Payer: Self-pay | Admitting: Cardiology

## 2016-07-09 NOTE — Telephone Encounter (Signed)
New message    Patient calling the office for samples of medication:   1.  What medication and dosage are you requesting samples for?  apixaban (ELIQUIS) 5 MG TABS tablet Take 1 tablet (5 mg total) by mouth 2 (two) times daily.     2.  Are you currently out of this medication?  Has a couple left

## 2016-07-09 NOTE — Telephone Encounter (Signed)
Medication Samples have been provided to the patient.  Drug name: Eliquis        Strength: 5 mg         Qty: 3 boxes   LOT: [id number]   Exp.Date: 10/20  Patient has been made notified.

## 2016-07-24 ENCOUNTER — Other Ambulatory Visit: Payer: Self-pay | Admitting: Physician Assistant

## 2016-07-24 ENCOUNTER — Other Ambulatory Visit: Payer: Self-pay | Admitting: Cardiology

## 2016-07-24 ENCOUNTER — Encounter: Payer: Self-pay | Admitting: Physician Assistant

## 2016-07-24 ENCOUNTER — Ambulatory Visit (INDEPENDENT_AMBULATORY_CARE_PROVIDER_SITE_OTHER): Payer: Medicare HMO | Admitting: Physician Assistant

## 2016-07-24 VITALS — BP 130/80 | HR 76 | Temp 97.9°F | Resp 14 | Ht 72.0 in | Wt 213.0 lb

## 2016-07-24 DIAGNOSIS — E876 Hypokalemia: Secondary | ICD-10-CM

## 2016-07-24 DIAGNOSIS — M459 Ankylosing spondylitis of unspecified sites in spine: Secondary | ICD-10-CM

## 2016-07-24 MED ORDER — DULOXETINE HCL 30 MG PO CPEP: 30.0000 mg | ORAL_CAPSULE | Freq: Every day | ORAL | 3 refills | Status: DC

## 2016-07-24 MED ORDER — HYDROCODONE-ACETAMINOPHEN 10-325 MG PO TABS: 1.0000 | ORAL_TABLET | Freq: Four times a day (QID) | ORAL | 0 refills | Status: DC | PRN

## 2016-07-24 MED FILL — DULoxetine HCL 30 MG CPEP: 30 | 30 days supply | Qty: 30 | Fill #0

## 2016-07-24 NOTE — Telephone Encounter (Signed)
REFILL 

## 2016-07-24 NOTE — Patient Instructions (Signed)
Please go to the lab for blood work. I will call with results.  Continue medications as directed with the following exception: Stop the Lexapro. Start the Cymbalta instead.  Follow-up with me in 3-4 weeks for reassessment. If you note any side effects from the [place], stop the medication and call me.

## 2016-07-24 NOTE — Progress Notes (Signed)
Pre visit review using our clinic review tool, if applicable. No additional management support is needed unless otherwise documented below in the visit note. 

## 2016-07-24 NOTE — Progress Notes (Signed)
Patient presents to clinic today for medication management. Patient is currently on a regimen of hydrocodone for chronic pain secondary to ankylosing spondylitis. Is taking medication as needed for severe pain when exacerbated. States he is having to take medication mainly on days when he is heavily physically active. Is not taking more than as directed. Last fill was for 60 tablets on 06/28/16. Averaging 2 tablets per day. Is working on stretching to help with chronic pain. Has previously declined Pain Clinic due to cost of visits and drug screens. Patient has [place] on file and UDS is up-to-date. Screenings every 6 months.   Past Medical History:  Diagnosis Date  . Ankylosing spondylitis ([place])   . Arthritis   . CAD (coronary artery disease) 4765,4650   30% mid LAD lesion on cardiac cath  . Chicken pox   . Cholelithiasis   . GERD (gastroesophageal reflux disease)   . [place] measles   . Hiatal hernia   . Hypertension   . Nephrolithiasis   . Persistent atrial fibrillation ([place])   . Sleep apnea   . 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 Ventricular hypertrophy     Current Outpatient Prescriptions on File Prior to Visit  Medication Sig Dispense Refill  . allopurinol (ZYLOPRIM) 100 MG tablet TAKE 1 TABLET EVERY DAY 90 tablet 1  . apixaban (ELIQUIS) 5 MG TABS tablet Take 1 tablet (5 mg total) by mouth 2 (two) times daily. 21 tablet 0  . atorvastatin (LIPITOR) 10 MG tablet TAKE 1 TABLET EVERY DAY 90 tablet 1  . Loperamide HCl (IMODIUM PO) Take by mouth as needed. Reported on 05/10/2015    . omeprazole (PRILOSEC) 40 MG capsule Take 1 capsule (40 mg total) by mouth daily. 30 capsule 5   No current facility-administered medications on file prior to visit.     Allergies  Allergen Reactions  . Coumadin [Warfarin]     GI bleeds    Family History  Problem Relation Age of Onset  . Heart disease Father 35       Deceased  . Heart attack Father 28  . Heart disease Brother 62       "Died in his  sleep"  . Arthritis/Rheumatoid Mother        Deceased-86  . Hyperlipidemia Brother   . Stroke Brother 83       Deceased  . Other Sister        Estate agent in [place]  . Other Sister        MVA  . Other Brother        Carjacked-killed  . Dementia Paternal Grandmother   . Cancer Paternal Uncle   . Healthy Brother   . Healthy Son        X3  . Healthy Daughter        x1  . Colon cancer Neg Hx     Social History   Social History  . Marital status: Married    Spouse name: N/A  . Number of children: 6  . Years of education: N/A   Occupational History  . Retired Retired    Estate manager/land agent   Social History Main Topics  . Smoking status: Never Smoker  . Smokeless tobacco: Never Used  . Alcohol use No  . Drug use: No  . Sexual activity: Not Asked   Other Topics Concern  . None   Social History Narrative   4 caffeine drinks daily    Review of Systems - See HPI.  All other ROS are negative.  BP 130/80   Pulse 76   Temp 97.9 F (36.6 C) (Oral)   Resp 14   Ht 6' (1.829 m)   Wt 213 lb (96.6 kg)   SpO2 98%   BMI 28.89 kg/m   Physical Exam  Constitutional: He is oriented to person, place, and time and well-developed, well-nourished, and in no distress.  HENT:  Head: Normocephalic and atraumatic.  Eyes: Conjunctivae are normal.  Neck: Neck supple.  Cardiovascular: Normal heart sounds and intact distal pulses.   Chronic irregularly irregular rhythm.   Pulmonary/Chest: Effort normal and breath sounds normal. No respiratory distress. He has no wheezes. He has no rales. He exhibits no tenderness.  Neurological: He is alert and oriented to person, place, and time.  Skin: Skin is warm and dry. No rash noted.  Psychiatric: Affect normal.  Vitals reviewed.  Assessment/Plan: Ankylosing spondylitis UDS up-to-date. CSC on file. Will continue Hydrocodone as directed. Refill given today as  Database reviewed without any red flags. Will also switch Lexapro 10 mg to Duloxetine 30 mg  daily to help with mood and with chronic pain. Close follow-up scheduled. Strict ER precautions reviewed with transition from SSRI to SNRI. Attempting to get more pain control while weaning down on narcotics.     Leeanne Rio, PA-C

## 2016-07-25 LAB — COMPREHENSIVE METABOLIC PANEL
ALT: 12 U/L (ref 9–46)
AST: 19 U/L (ref 10–35)
Albumin: 3.9 g/dL (ref 3.6–5.1)
Alkaline Phosphatase: 65 U/L (ref 40–115)
BUN: 17 mg/dL (ref 7–25)
CHLORIDE: 104 mmol/L (ref 98–110)
CO2: 28 mmol/L (ref 20–31)
CREATININE: 1.34 mg/dL — AB (ref 0.70–1.18)
Calcium: 8.9 mg/dL (ref 8.6–10.3)
GLUCOSE: 99 mg/dL (ref 65–99)
Potassium: 3.7 mmol/L (ref 3.5–5.3)
SODIUM: 143 mmol/L (ref 135–146)
Total Bilirubin: 1.7 mg/dL — ABNORMAL HIGH (ref 0.2–1.2)
Total Protein: 6.1 g/dL (ref 6.1–8.1)

## 2016-07-25 MED FILL — HYDROCODON-APAP 10-325: 10-325 | 15 days supply | Qty: 60 | Fill #0

## 2016-07-26 NOTE — Assessment & Plan Note (Addendum)
UDS up-to-date. CSC on file. Will continue Hydrocodone as directed. Refill given today as [place] Database reviewed without any red flags. Will also switch Lexapro 10 mg to Duloxetine 30 mg daily to help with mood and with chronic pain. Close follow-up scheduled. Strict ER precautions reviewed with transition from SSRI to SNRI. Attempting to get more pain control while weaning down on narcotics.

## 2016-07-30 ENCOUNTER — Telehealth: Payer: Self-pay | Admitting: Cardiology

## 2016-07-30 NOTE — Telephone Encounter (Signed)
At the front desk  Left detailed message(DPR)

## 2016-07-30 NOTE — Telephone Encounter (Signed)
Patient calling the office for samples of medication:   1.  What medication and dosage are you requesting samples for?Eliquis 2.  Are you currently out of this medication? For a couple of days

## 2016-08-15 ENCOUNTER — Telehealth: Payer: Self-pay | Admitting: Physician Assistant

## 2016-08-15 DIAGNOSIS — I2510 Atherosclerotic heart disease of native coronary artery without angina pectoris: Secondary | ICD-10-CM

## 2016-08-15 DIAGNOSIS — E876 Hypokalemia: Secondary | ICD-10-CM

## 2016-08-15 MED ORDER — FUROSEMIDE 20 MG PO TABS: 20.0000 mg | ORAL_TABLET | Freq: Every day | ORAL | 1 refills | Status: DC

## 2016-08-15 MED ORDER — POTASSIUM CHLORIDE ER 20 MEQ PO TBCR: 20.0000 meq | EXTENDED_RELEASE_TABLET | Freq: Every day | ORAL | 1 refills | Status: DC

## 2016-08-15 MED ORDER — DULOXETINE HCL 30 MG PO CPEP: 30.0000 mg | ORAL_CAPSULE | Freq: Every day | ORAL | 1 refills | Status: DC

## 2016-08-15 MED ORDER — ALLOPURINOL 100 MG PO TABS: 100.0000 mg | ORAL_TABLET | Freq: Every day | ORAL | 1 refills | Status: DC

## 2016-08-15 MED ORDER — ATORVASTATIN CALCIUM 10 MG PO TABS: 10.0000 mg | ORAL_TABLET | Freq: Every day | ORAL | 1 refills | Status: DC

## 2016-08-15 NOTE — Telephone Encounter (Signed)
Allopurinol, Lipitor, Cymbalta and Potassium were faxed to [facility]

## 2016-08-15 NOTE — Telephone Encounter (Signed)
Pt ask that we resend all Rx to [facility] order, [facility] Ros[facility]ti states that they have not received them.

## 2016-08-20 ENCOUNTER — Ambulatory Visit (INDEPENDENT_AMBULATORY_CARE_PROVIDER_SITE_OTHER): Payer: Medicare HMO | Admitting: Physician Assistant

## 2016-08-20 ENCOUNTER — Encounter: Payer: Self-pay | Admitting: Physician Assistant

## 2016-08-20 VITALS — BP 126/72 | HR 65 | Temp 97.7°F | Resp 14 | Ht 72.0 in | Wt 211.0 lb

## 2016-08-20 DIAGNOSIS — F419 Anxiety disorder, unspecified: Secondary | ICD-10-CM

## 2016-08-20 DIAGNOSIS — M459 Ankylosing spondylitis of unspecified sites in spine: Secondary | ICD-10-CM

## 2016-08-20 DIAGNOSIS — G8929 Other chronic pain: Secondary | ICD-10-CM | POA: Diagnosis not present

## 2016-08-20 DIAGNOSIS — F329 Major depressive disorder, single episode, unspecified: Secondary | ICD-10-CM | POA: Diagnosis not present

## 2016-08-20 DIAGNOSIS — F32A Depression, unspecified: Secondary | ICD-10-CM

## 2016-08-20 MED ORDER — HYDROCODONE-ACETAMINOPHEN 10-325 MG PO TABS: 1.0000 | ORAL_TABLET | Freq: Four times a day (QID) | ORAL | 0 refills | Status: DC | PRN

## 2016-08-20 NOTE — Patient Instructions (Addendum)
Please continue the Cymbalta daily as directed. I am keeping U on the same dose for now.  Please continue the hydrocodone as directed. I'm giving U a few extra tablets for the next month, giving your increase in pain secondary to working longer hours.  Follow-up with me in 1 month. At that time we will reassess Cymbalta dose and decide if further adjustments are needed.  Follow-up with your specialistsin a as scheduled.

## 2016-08-20 NOTE — Progress Notes (Signed)
Patient presents to clinic today for follow-up of chronic pain secondary to osteoarthritis and ankylosing spondylitis. At last visit, patient was switched from Lexapro Cymbalta, and hopes of helping not only mood but also chronic pain. Patient endorses taking medication as directed. Noted significant improvement in mood with medication change. He is resting better and feels less irritable. In regards to pain, patient nod some initial improvement. States that over the past week his pain level has been increased secondary to working longer hours. Patient states this is his busy season and will likely continue this schedule for the next 1-2 months. Patient is due for refill of his hydrocodone. Patient is taking as directed. CSC is on file. Urine drug screen is up to date..   Past Medical History:  Diagnosis Date  . Ankylosing spondylitis ([place])   . Arthritis   . CAD (coronary artery disease) 8177,1165   30% mid LAD lesion on cardiac cath  . Chicken pox   . Cholelithiasis   . GERD (gastroesophageal reflux disease)   . [place] measles   . Hiatal hernia   . Hypertension   . Nephrolithiasis   . Persistent atrial fibrillation ([place])   . Sleep apnea   . 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 Ventricular hypertrophy     Current Outpatient Prescriptions on File Prior to Visit  Medication Sig Dispense Refill  . allopurinol (ZYLOPRIM) 100 MG tablet Take 1 tablet (100 mg total) by mouth daily. 90 tablet 1  . apixaban (ELIQUIS) 5 MG TABS tablet Take 1 tablet (5 mg total) by mouth 2 (two) times daily. 21 tablet 0  . atorvastatin (LIPITOR) 10 MG tablet Take 1 tablet (10 mg total) by mouth daily. 90 tablet 1  . CARTIA XT 180 MG 24 hr capsule TAKE 1 CAPSULE EVERY DAY 90 capsule 2  . DULoxetine (CYMBALTA) 30 MG capsule Take 1 capsule (30 mg total) by mouth daily. 90 capsule 1  . furosemide (LASIX) 20 MG tablet Take 1 tablet (20 mg total) by mouth daily. 90 tablet 1  . Loperamide HCl (IMODIUM PO) Take by mouth  as needed. Reported on 05/10/2015    . metoprolol succinate (TOPROL-XL) 100 MG 24 hr tablet TAKE 1 TABLET TWICE DAILY 180 tablet 2  . omeprazole (PRILOSEC) 40 MG capsule Take 1 capsule (40 mg total) by mouth daily. 30 capsule 5  . Potassium Chloride ER 20 MEQ TBCR Take 20 mEq by mouth daily. 90 tablet 1  . tiZANidine (ZANAFLEX) 4 MG tablet TAKE 1 TABLET BY MOUTH EVERY 8 HOURS AS NEEDED FOR MUSCLE SPASMS. 90 tablet 0   No current facility-administered medications on file prior to visit.     Allergies  Allergen Reactions  . Coumadin [Warfarin]     GI bleeds    Family History  Problem Relation Age of Onset  . Heart disease Father 85       Deceased  . Heart attack Father 7  . Heart disease Brother 36       "Died in his sleep"  . Arthritis/Rheumatoid Mother        Deceased-86  . Hyperlipidemia Brother   . Stroke Brother 88       Deceased  . Other Sister        Estate agent in [place]  . Other Sister        MVA  . Other Brother        Carjacked-killed  . Dementia Paternal Grandmother   . Cancer Paternal Uncle   .  Healthy Brother   . Healthy Son        X3  . Healthy Daughter        x1  . Colon cancer Neg Hx     Social History   Social History  . Marital status: Married    Spouse name: N/A  . Number of children: 6  . Years of education: N/A   Occupational History  . Retired Retired    Estate manager/land agent   Social History Main Topics  . Smoking status: Never Smoker  . Smokeless tobacco: Never Used  . Alcohol use No  . Drug use: No  . Sexual activity: Not Asked   Other Topics Concern  . None   Social History Narrative   4 caffeine drinks daily     Review of Systems - See HPI.  All other ROS are negative.  BP 126/72   Pulse 65   Temp 97.7 F (36.5 C) (Oral)   Resp 14   Ht 6' (1.829 m)   Wt 211 lb (95.7 kg)   SpO2 98%   BMI 28.62 kg/m   Physical Exam  Constitutional: He is oriented to person, place, and time and well-developed, well-nourished, and in no  distress.  HENT:  Head: Normocephalic and atraumatic.  Eyes: Conjunctivae are normal.  Neck: Neck supple.  Cardiovascular: Normal heart sounds and intact distal pulses.   Chronic irregularly irregular rhythm of A. Fib.  Pulmonary/Chest: Effort normal and breath sounds normal. No respiratory distress. He has no wheezes. He has no rales. He exhibits no tenderness.  Neurological: He is alert and oriented to person, place, and time.  Skin: Skin is warm and dry. No rash noted.  Psychiatric: Affect normal.  Vitals reviewed.   Recent Results (from the past 2160 hour(s))  Comp Met (CMET)     Status: Abnormal   Collection Time: 07/24/16  4:44 PM  Result Value Ref Range   Sodium 143 135 - 146 mmol/L   Potassium 3.7 3.5 - 5.3 mmol/L   Chloride 104 98 - 110 mmol/L   CO2 28 20 - 31 mmol/L   Glucose, Bld 99 65 - 99 mg/dL   BUN 17 7 - 25 mg/dL   Creat 1.34 (H) 0.70 - 1.18 mg/dL    Comment:   For patients > or = 75 years of age: The upper reference limit for Creatinine is approximately 13% higher for people identified as African-American.      Total Bilirubin 1.7 (H) 0.2 - 1.2 mg/dL   Alkaline Phosphatase 65 40 - 115 U/L   AST 19 10 - 35 U/L   ALT 12 9 - 46 U/L   Total Protein 6.1 6.1 - 8.1 g/dL   Albumin 3.9 3.6 - 5.1 g/dL   Calcium 8.9 8.6 - 10.3 mg/dL    Assessment/Plan: Ankylosing spondylitis, unspecified site of spine (HCC) Encounter for chronic pain management Indication for chronic opioid: noted above Medication and dose: Hydrocodone 10-325. # pills per month: 60 -- increased to 75 due to increased pain 2/2 activity level Last UDS date: 06/01/2016 Pain contract signed (Y/N): Y Date narcotic database last reviewed (include red flags): 08/20/2016. No red flags.  Will continue Hydrocodone. Will also continue Cymbalta at current dose for an additional month. Will reassess pain level at follow-up. May increase Cymbalta further at that time.   Anxiety and Depression Much improved  with change to Cymbalta. Tolerating well without side effect. Continue current regimen.   Leeanne Rio, PA-C

## 2016-08-20 NOTE — Progress Notes (Signed)
Pre visit review using our clinic review tool, if applicable. No additional management support is needed unless otherwise documented below in the visit note. 

## 2016-08-21 MED FILL — HYDROCODON-APAP 10-325: 10-325 | 18 days supply | Qty: 75 | Fill #0

## 2016-08-28 ENCOUNTER — Other Ambulatory Visit: Payer: Self-pay | Admitting: Cardiology

## 2016-08-28 MED ORDER — APIXABAN 5 MG PO TABS: 5.0000 mg | ORAL_TABLET | Freq: Two times a day (BID) | ORAL | 5 refills | Status: DC

## 2016-08-28 NOTE — Telephone Encounter (Signed)
Pt notified no samples available he would like to see if [facility] has samples, he states that he has got some there before. He states that his cost $190/mo   No samples available [facility]-pt notified he states that he has a few days left and will call back later in the week to see if any samples are available at that time. New rx sent to pharmacy. Pt assistance form printed and mailed to pt

## 2016-08-28 NOTE — Telephone Encounter (Signed)
Patient calling the office for samples of medication: ° ° °1.  What medication and dosage are you requesting samples for? °Eliquis 5 mg  °2.  Are you currently out of this medication?  °Yes ° ° ° °

## 2016-09-03 ENCOUNTER — Telehealth: Payer: Self-pay | Admitting: Cardiology

## 2016-09-03 NOTE — Telephone Encounter (Signed)
Eliquis 5 mg #3 lot [id number] exp 12/20 Left message to call back

## 2016-09-03 NOTE — Telephone Encounter (Signed)
New message ° ° ° ° ° °Patient calling the office for samples of medication: ° ° °1.  What medication and dosage are you requesting samples for? Eliquis 5 mg  ° °2.  Are you currently out of this medication? Yes  ° ° ° °

## 2016-09-04 ENCOUNTER — Telehealth: Payer: Self-pay | Admitting: Physician Assistant

## 2016-09-04 MED ORDER — DULOXETINE HCL 20 MG PO CPEP: 20.0000 mg | ORAL_CAPSULE | Freq: Every day | ORAL | 0 refills | Status: DC

## 2016-09-04 MED FILL — DULoxetine HCL 20 MG CPEP: 20 | 30 days supply | Qty: 30 | Fill #0

## 2016-09-04 NOTE — Telephone Encounter (Signed)
Ok to switch for a 20 mg dose. Ok to send in to local pharmacy -- 30 with 0 refills. If no improvement in grogginess, will need to stop and switch him back to lexapro.

## 2016-09-04 NOTE — Telephone Encounter (Signed)
Medication sent to pharmacy.  Patient notified and made aware that if he does not see improvement we will need to switch back. Stated verbal understanding.

## 2016-09-04 NOTE — Telephone Encounter (Signed)
Patient called because he wants his Cymbalta to be reduced down one size. He  currently feels, "hungover, and drowsy and unable to work;" and says, "it's just a little too strong." Patient uses [facility]. Please advise

## 2016-09-04 NOTE — Telephone Encounter (Signed)
Advised patient  Discussed studies recommended at last ov in April (24 hr monitor and nuclear stress test) and he does not want to reschedule at this time

## 2016-09-11 ENCOUNTER — Telehealth: Payer: Self-pay | Admitting: Cardiology

## 2016-09-11 NOTE — Telephone Encounter (Signed)
New Message    Patient calling the office for samples of medication:   1.  What medication and dosage are you requesting samples for? cialis or Viagra   2.  Are you currently out of this medication?  Yes

## 2016-09-11 NOTE — Telephone Encounter (Signed)
Spoke with Patient he was seeking samples of Cialis or Viagra and med was not on current med list. Informed him that we dont have samples of that medicine here at [facility]. Advised him to contact his PCP or I could ask Dr Percival Spanish if he would prescribe the med. Patient stated he would like for me to ask Dr Percival Spanish for a rx.  I will route message to Dr Enrique Sack.

## 2016-09-12 NOTE — Telephone Encounter (Signed)
Pt notified Hochrein out of the office and should call PCP for rx, verbalized understanding

## 2016-09-17 ENCOUNTER — Ambulatory Visit: Payer: Medicare HMO | Admitting: Physician Assistant

## 2016-09-19 ENCOUNTER — Encounter: Payer: Self-pay | Admitting: Physician Assistant

## 2016-09-19 ENCOUNTER — Ambulatory Visit (INDEPENDENT_AMBULATORY_CARE_PROVIDER_SITE_OTHER): Payer: Medicare HMO | Admitting: Physician Assistant

## 2016-09-19 VITALS — BP 118/70 | HR 82 | Temp 97.7°F | Resp 14 | Ht 72.0 in | Wt 210.0 lb

## 2016-09-19 DIAGNOSIS — F419 Anxiety disorder, unspecified: Secondary | ICD-10-CM | POA: Diagnosis not present

## 2016-09-19 DIAGNOSIS — F329 Major depressive disorder, single episode, unspecified: Secondary | ICD-10-CM

## 2016-09-19 MED ORDER — ESCITALOPRAM OXALATE 10 MG PO TABS: 10.0000 mg | ORAL_TABLET | Freq: Every day | ORAL | 1 refills | Status: DC

## 2016-09-19 MED ORDER — SILDENAFIL CITRATE 20 MG PO TABS: ORAL_TABLET | ORAL | 0 refills | Status: DC

## 2016-09-19 MED ORDER — HYDROCODONE-ACETAMINOPHEN 10-325 MG PO TABS: 1.0000 | ORAL_TABLET | Freq: Four times a day (QID) | ORAL | 0 refills | Status: DC | PRN

## 2016-09-19 MED FILL — ESCITALOPRAM 10 MG TABLET: 10 | 30 days supply | Qty: 30 | Fill #0

## 2016-09-19 NOTE — Progress Notes (Signed)
Pre visit review using our clinic review tool, if applicable. No additional management support is needed unless otherwise documented below in the visit note. 

## 2016-09-19 NOTE — Progress Notes (Signed)
Patient presents to clinic today for follow-up of anxiety/depression. At last visit, patient was switched to Cymbalta from Lexapro. Initially endorsed significant improvement in mood and chronic. Then began noting hypersomnolence so dose of medication was decreased. Patient noted worsening of mood with decrease in medication but side effects did subside. Would like to discuss switching back to lexapro. Denies SI/HI  Past Medical History:  Diagnosis Date  . Ankylosing spondylitis ([place])   . Arthritis   . CAD (coronary artery disease) 5277,8242   30% mid LAD lesion on cardiac cath  . Chicken pox   . Cholelithiasis   . GERD (gastroesophageal reflux disease)   . [place] measles   . Hiatal hernia   . Hypertension   . Nephrolithiasis   . Persistent atrial fibrillation ([place])   . Sleep apnea   . 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 Ventricular hypertrophy     Current Outpatient Prescriptions on File Prior to Visit  Medication Sig Dispense Refill  . allopurinol (ZYLOPRIM) 100 MG tablet Take 1 tablet (100 mg total) by mouth daily. 90 tablet 1  . apixaban (ELIQUIS) 5 MG TABS tablet Take 1 tablet (5 mg total) by mouth 2 (two) times daily. 60 tablet 5  . atorvastatin (LIPITOR) 10 MG tablet Take 1 tablet (10 mg total) by mouth daily. 90 tablet 1  . CARTIA XT 180 MG 24 hr capsule TAKE 1 CAPSULE EVERY DAY 90 capsule 2  . furosemide (LASIX) 20 MG tablet Take 1 tablet (20 mg total) by mouth daily. 90 tablet 1  . Loperamide HCl (IMODIUM PO) Take by mouth as needed. Reported on 05/10/2015    . metoprolol succinate (TOPROL-XL) 100 MG 24 hr tablet TAKE 1 TABLET TWICE DAILY 180 tablet 2  . omeprazole (PRILOSEC) 40 MG capsule Take 1 capsule (40 mg total) by mouth daily. 30 capsule 5  . Potassium Chloride ER 20 MEQ TBCR Take 20 mEq by mouth daily. 90 tablet 1  . tiZANidine (ZANAFLEX) 4 MG tablet TAKE 1 TABLET BY MOUTH EVERY 8 HOURS AS NEEDED FOR MUSCLE SPASMS. 90 tablet 0   No current facility-administered  medications on file prior to visit.     Allergies  Allergen Reactions  . Coumadin [Warfarin]     GI bleeds    Family History  Problem Relation Age of Onset  . Heart disease Father 59       Deceased  . Heart attack Father 49  . Heart disease Brother 26       "Died in his sleep"  . Arthritis/Rheumatoid Mother        Deceased-86  . Hyperlipidemia Brother   . Stroke Brother 76       Deceased  . Other Sister        Estate agent in [place]  . Other Sister        MVA  . Other Brother        Carjacked-killed  . Dementia Paternal Grandmother   . Cancer Paternal Uncle   . Healthy Brother   . Healthy Son        X3  . Healthy Daughter        x1  . Colon cancer Neg Hx     Social History   Social History  . Marital status: Married    Spouse name: N/A  . Number of children: 6  . Years of education: N/A   Occupational History  . Retired Retired    Engineer, petroleum  History Main Topics  . Smoking status: Never Smoker  . Smokeless tobacco: Never Used  . Alcohol use No  . Drug use: No  . Sexual activity: Not Asked   Other Topics Concern  . None   Social History Narrative   4 caffeine drinks daily     Review of Systems - See HPI.  All other ROS are negative.  BP 118/70   Pulse 82   Temp 97.7 F (36.5 C) (Oral)   Resp 14   Ht 6' (1.829 m)   Wt 210 lb (95.3 kg)   SpO2 96%   BMI 28.48 kg/m   Physical Exam  Constitutional: He is well-developed, well-nourished, and in no distress.  HENT:  Head: Normocephalic and atraumatic.  Eyes: Conjunctivae are normal.  Cardiovascular: Normal rate, regular rhythm, normal heart sounds and intact distal pulses.   Pulmonary/Chest: Effort normal and breath sounds normal. No respiratory distress. He has no wheezes. He has no rales. He exhibits no tenderness.  Neurological: He is alert.  Skin: Skin is warm and dry. No rash noted.  Psychiatric: Affect normal.  Vitals reviewed.   Recent Results (from the past 2160 hour(s))    Comp Met (CMET)     Status: Abnormal   Collection Time: 07/24/16  4:44 PM  Result Value Ref Range   Sodium 143 135 - 146 mmol/L   Potassium 3.7 3.5 - 5.3 mmol/L   Chloride 104 98 - 110 mmol/L   CO2 28 20 - 31 mmol/L   Glucose, Bld 99 65 - 99 mg/dL   BUN 17 7 - 25 mg/dL   Creat 1.34 (H) 0.70 - 1.18 mg/dL    Comment:   For patients > or = 75 years of age: The upper reference limit for Creatinine is approximately 13% higher for people identified as African-American.      Total Bilirubin 1.7 (H) 0.2 - 1.2 mg/dL   Alkaline Phosphatase 65 40 - 115 U/L   AST 19 10 - 35 U/L   ALT 12 9 - 46 U/L   Total Protein 6.1 6.1 - 8.1 g/dL   Albumin 3.9 3.6 - 5.1 g/dL   Calcium 8.9 8.6 - 10.3 mg/dL    Assessment/Plan: Anxiety and depression Will switch back to Lexapro 10 mg from low-dose Cymbalta. Follow-up scheduled.     Leeanne Rio, PA-C

## 2016-09-19 NOTE — Patient Instructions (Signed)
Please stop the Cymbalta and switch to Lexapro. I have sent in a new prescription.    I have refilled your pain medication today.  I have sent in a prescription for the generic viagra. Hydrate well before use. Do not use more than as directed without consulting me or Dr. Percival Spanish.

## 2016-09-20 MED FILL — SILDENAFIL 20 MG TABLET: 20 | 30 days supply | Qty: 30 | Fill #0

## 2016-09-20 MED FILL — HYDROCODON-APAP 10-325: 10-325 | 19 days supply | Qty: 75 | Fill #0

## 2016-09-23 NOTE — Assessment & Plan Note (Signed)
Will switch back to Lexapro 10 mg from low-dose Cymbalta. Follow-up scheduled.

## 2016-09-24 ENCOUNTER — Telehealth: Payer: Self-pay | Admitting: Cardiology

## 2016-09-24 NOTE — Telephone Encounter (Signed)
New message  Patient calling the office for samples of medication:   1.  What medication and dosage are you requesting samples for? Eliquis 5mg   2.  Are you currently out of this medication? yes

## 2016-09-24 NOTE — Telephone Encounter (Signed)
lmtcb--currently out of Eliquis 5 and 2.5 samples

## 2016-09-25 NOTE — Telephone Encounter (Signed)
Error-disregard prev message

## 2016-09-25 NOTE — Telephone Encounter (Signed)
F/U call: Patient calling in reference to Eliquis samples. Informed patient of the note in Epic.

## 2016-09-25 NOTE — Telephone Encounter (Signed)
S/w scheduling sent to pre-cert they will call and schedule

## 2016-09-28 MED ORDER — APIXABAN 5 MG PO TABS: 5.0000 mg | ORAL_TABLET | Freq: Two times a day (BID) | ORAL | 5 refills | Status: DC

## 2016-09-28 NOTE — Telephone Encounter (Signed)
F/U call:  Patient calling the office for samples of medication:   1.  What medication and dosage are you requesting samples for? Eliquis 5 mg  2.  Are you currently out of this medication?

## 2016-09-28 NOTE — Telephone Encounter (Signed)
SPOKE TO PATIENT NO SAMPLES AVAILBE,   E-SENT PRESCRIPTION INTO [facility] [place] [address] PER PATIENT REQUEST

## 2016-10-03 ENCOUNTER — Telehealth: Payer: Self-pay | Admitting: Cardiology

## 2016-10-03 NOTE — Telephone Encounter (Signed)
New message ° ° ° ° ° °Patient calling the office for samples of medication: ° ° °1.  What medication and dosage are you requesting samples for? Eliquis 5 mg  ° °2.  Are you currently out of this medication? Yes  ° ° ° °

## 2016-10-03 NOTE — Telephone Encounter (Signed)
Patient called and made aware that samples were available at the front for him.  Medication Samples have been provided to the patient.  Drug name: Eliquis        Strength: 5 mg         Qty: 3 boxes   LOT: [id number]   Exp.Date: 12/20

## 2016-10-04 ENCOUNTER — Telehealth: Payer: Self-pay | Admitting: Physician Assistant

## 2016-10-04 NOTE — Telephone Encounter (Signed)
Pt asking for a call back regarding his medication, pt states that it is not working for him like he thought it would.

## 2016-10-04 NOTE — Telephone Encounter (Signed)
Called spoke with patient about medication problems. He did not want to discuss with me over the phone. He wanted to discuss with [facility]. I asked about medication since the change in Cymbalta to Lexapro. He did not have any problems with that medication. Please advise

## 2016-10-05 NOTE — Telephone Encounter (Signed)
He will need to give [place] an idea regarding what the issue is as I am in clinic all day today seeing patients.

## 2016-10-08 NOTE — Telephone Encounter (Signed)
Spoke with patient and he didn't not want to discuss his problem/situation with me. He wanted to wait until his appt with Einar Pheasant to discuss

## 2016-10-17 ENCOUNTER — Telehealth: Payer: Self-pay | Admitting: Cardiology

## 2016-10-17 ENCOUNTER — Ambulatory Visit (INDEPENDENT_AMBULATORY_CARE_PROVIDER_SITE_OTHER): Payer: Medicare HMO | Admitting: Physician Assistant

## 2016-10-17 ENCOUNTER — Encounter: Payer: Self-pay | Admitting: Physician Assistant

## 2016-10-17 VITALS — BP 180/100 | HR 80 | Ht 72.0 in | Wt 213.0 lb

## 2016-10-17 DIAGNOSIS — Z79899 Other long term (current) drug therapy: Secondary | ICD-10-CM

## 2016-10-17 DIAGNOSIS — R42 Dizziness and giddiness: Secondary | ICD-10-CM

## 2016-10-17 DIAGNOSIS — I10 Essential (primary) hypertension: Secondary | ICD-10-CM | POA: Diagnosis not present

## 2016-10-17 DIAGNOSIS — I482 Chronic atrial fibrillation, unspecified: Secondary | ICD-10-CM

## 2016-10-17 DIAGNOSIS — I2510 Atherosclerotic heart disease of native coronary artery without angina pectoris: Secondary | ICD-10-CM

## 2016-10-17 DIAGNOSIS — I480 Paroxysmal atrial fibrillation: Secondary | ICD-10-CM | POA: Diagnosis not present

## 2016-10-17 MED ORDER — DILTIAZEM HCL ER COATED BEADS 240 MG PO CP24: 240.0000 mg | ORAL_CAPSULE | Freq: Every day | ORAL | 3 refills | Status: DC

## 2016-10-17 MED FILL — CARTIA XT 240 MG CAPSULE: 240 | 90 days supply | Qty: 90 | Fill #0

## 2016-10-17 NOTE — Patient Instructions (Signed)
Medication Instructions: INCREASE the Diltiazem to 240 mg tablet daily. Please start taking the Atorvastatin   If you need a refill on your cardiac medications before your next appointment, please call your pharmacy.   Labwork: Your physician recommends that you have the following drawn today: CBC, TSH Your physician recommends that you return for lab work in: 2 months to have a FASTING lipid and LFT.   Follow-Up: Your physician wants you to follow-up in: 2-3 months with Dr. Percival Spanish. You will receive a reminder letter in the mail two months in advance. If you don't receive a letter, please call our office to schedule this follow-up appointment.   Thank you for choosing Heartcare at [facility]!!

## 2016-10-17 NOTE — Telephone Encounter (Signed)
s s/w pt scheduled appt today with [facility] @ 930am he states that he will leave right now to make it on time

## 2016-10-17 NOTE — Progress Notes (Signed)
Cardiology Office Note    Date:  10/17/2016   ID:  MAKYA YURKO, DOB Sep 21, 1940, MRN [id number]  PCP:  Brunetta Jeans, PA-C  Cardiologist:  Dr. Percival Spanish   Chief Complaint  Patient presents with  . Follow-up    seen for Dr. Percival Spanish    History of Present Illness:  [name] is a 76 y.o. male with PMH of chronic atrial fibrillation, minimal CAD and HTN. He was noted to have very mild nonobstructive CAD by cath on 03/24/2012. EF was mildly reduced at 45% at time. Repeat 2-D echo in June 2015 revealed improved EF of 55%. He had a ETT on 04/10/2016 that showed ST segment depression in lead 2, 3, aVF, V5 and V6, poor exercise tolerance. Echocardiogram obtained on 04/16/2016 showed EF 55-60%, mild AI, small posterior pericardial effusion.  He was added on today, he says he felt off since Monday. His blood pressure has been spiking. He denies any chest pain, 2 days ago he had an episode of orthopnea and PND. This has since resolved. His blood pressure on initial arrival was 180/100. On manual recheck by me was 154/76. He also noticed his heart rate become more uncontrolled recently. I'm hesitant to be too aggressive in controlling his blood pressure and heart rate as he actually had normal blood pressure in July and August. I will however increased her diltiazem to 240 mg daily. He says yesterday, he has a episode he feels really wiped out and close to pass out. He was in the car at the time with his wife. He says a similar episode occurred about 3-4 month ago when he was at the checkout line. If this feeling happens again, he will need a 30 day event monitor to further assess. Otherwise, he will need a TSH and a CBC to rule out secondary causes for dizziness and weakness. He has not been taking his Lipitor, I will restart the Lipitor 10 mg daily. He will need a fasting lipid panel and LFTs in 2 months. I will try to bring him back early in 2-3 months for close outpatient  follow-up.   Past Medical History:  Diagnosis Date  . Ankylosing spondylitis ([place])   . Arthritis   . CAD (coronary artery disease) 9242,6834   30% mid LAD lesion on cardiac cath  . Chicken pox   . Cholelithiasis   . GERD (gastroesophageal reflux disease)   . [place] measles   . Hiatal hernia   . Hypertension   . Nephrolithiasis   . Persistent atrial fibrillation ([place])   . Sleep apnea   . 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 Ventricular hypertrophy     Past Surgical History:  Procedure Laterality Date  . CARDIAC CATHETERIZATION  03/2012   30% mid LAD lesion otherwise normal cors, LVEF 65%  . CHOLECYSTECTOMY    . ELBOW ARTHROPLASTY     Left  . LEFT AND RIGHT HEART CATHETERIZATION WITH CORONARY ANGIOGRAM N/A 03/24/2012   Procedure: LEFT AND RIGHT HEART CATHETERIZATION WITH CORONARY ANGIOGRAM;  Surgeon: Sherren Mocha, MD;  Location[facility] C[facility] LAB;  Service: Cardiovascular;  Laterality: N/A;  . LEG FLUID REMOVAL RIGHT    . TONSILLECTOMY      Current Medications: Outpatient Medications Prior to Visit  Medication Sig Dispense Refill  . allopurinol (ZYLOPRIM) 100 MG tablet Take 1 tablet (100 mg total) by mouth daily. 90 tablet 1  . apixaban (ELIQUIS) 5 MG TABS tablet Take 1 tablet (5 mg total) by mouth  2 (two) times daily. 60 tablet 5  . atorvastatin (LIPITOR) 10 MG tablet Take 1 tablet (10 mg total) by mouth daily. 90 tablet 1  . escitalopram (LEXAPRO) 10 MG tablet Take 1 tablet (10 mg total) by mouth daily. 30 tablet 1  . furosemide (LASIX) 20 MG tablet Take 1 tablet (20 mg total) by mouth daily. 90 tablet 1  . HYDROcodone-acetaminophen (NORCO) 10-325 MG tablet Take 1 tablet by mouth every 6 (six) hours as needed. 75 tablet 0  . Loperamide HCl (IMODIUM PO) Take by mouth as needed. Reported on 05/10/2015    . metoprolol succinate (TOPROL-XL) 100 MG 24 hr tablet TAKE 1 TABLET TWICE DAILY 180 tablet 2  . omeprazole (PRILOSEC) 40 MG capsule Take 1 capsule (40 mg total) by mouth daily. 30  capsule 5  . Potassium Chloride ER 20 MEQ TBCR Take 20 mEq by mouth daily. 90 tablet 1  . sildenafil (REVATIO) 20 MG tablet Take 1 tablet as needed for ED. Do not take more than 1 dose in 36 hours. 30 tablet 0  . tiZANidine (ZANAFLEX) 4 MG tablet TAKE 1 TABLET BY MOUTH EVERY 8 HOURS AS NEEDED FOR MUSCLE SPASMS. 90 tablet 0  . CARTIA XT 180 MG 24 hr capsule TAKE 1 CAPSULE EVERY DAY 90 capsule 2   No facility-administered medications prior to visit.      Allergies:   Coumadin [warfarin]   Social History   Social History  . Marital status: Married    Spouse name: N/A  . Number of children: 6  . Years of education: N/A   Occupational History  . Retired Retired    Estate manager/land agent   Social History Main Topics  . Smoking status: Never Smoker  . Smokeless tobacco: Never Used  . Alcohol use No  . Drug use: No  . Sexual activity: Not Asked   Other Topics Concern  . None   Social History Narrative   4 caffeine drinks daily      Family History:  The patient's family history includes Arthritis/Rheumatoid in his mother; Cancer in his paternal uncle; Dementia in his paternal grandmother; Healthy in his brother, daughter, and son; Heart attack (age of onset: 84) in his father; Heart disease (age of onset: 27) in his father; Heart disease (age of onset: 66) in his brother; Hyperlipidemia in his brother; Other in his brother, sister, and sister; Stroke (age of onset: 70) in his brother.   ROS:   Please see the history of present illness.    ROS All other systems reviewed and are negative.   PHYSICAL EXAM:   VS:  BP (!) 180/100   Pulse 80   Ht 6' (1.829 m)   Wt 213 lb (96.6 kg)   BMI 28.89 kg/m    GEN: Well nourished, well developed, in no acute distress  HEENT: normal  Neck: no JVD, carotid bruits, or masses Cardiac: RRR; no murmurs, rubs, or gallops,no edema  Respiratory:  clear to auscultation bilaterally, normal work of breathing GI: soft, nontender, nondistended, + BS MS:  no deformity or atrophy  Skin: warm and dry, no rash Neuro:  Alert and Oriented x 3, Strength and sensation are intact Psych: euthymic mood, full affect  Wt Readings from Last 3 Encounters:  10/17/16 213 lb (96.6 kg)  09/19/16 210 lb (95.3 kg)  08/20/16 211 lb (95.7 kg)      Studies/Labs Reviewed:   EKG:  EKG is ordered today.  The ekg ordered today demonstrates atrial  fibrillation, heart rate 80, poor R-wave progression in anterior leads  Recent Labs: 07/24/2016: ALT 12; BUN 17; Creat 1.34; Potassium 3.7; Sodium 143 10/17/2016: Hemoglobin 16.4; Platelets 218; TSH WILL FOLLOW   Lipid Panel    Component Value Date/Time   CHOL 144 03/02/2016 1016   TRIG 104.0 03/02/2016 1016   HDL 36.30 (L) 03/02/2016 1016   CHOLHDL 4 03/02/2016 1016   VLDL 20.8 03/02/2016 1016   LDLCALC 87 03/02/2016 1016    Additional studies/ records that were reviewed today include:    Cath 03/24/2012 Procedural Findings: Hemodynamics RA 8 RV 32/8 PA 29/14 mean 20 PCWP 10 LV 104/18 AO 107/64  Oxygen saturations: PA 64 AO 92  Cardiac Output (Fick) 4.7  Cardiac Index (Fick) 2.2           Coronary angiography: Coronary dominance: right  Left mainstem: Widely patent no obstructive disease  Left anterior descending (LAD): Widely patent in proximal vessel. Mid-vessel with mild irregularity. Diag widely patent. No significant stenosis.  Left circumflex (LCx): Large vessel without obstructive disease. Large OM without significant stenosis.  Right coronary artery (RCA): Moderate caliber vessel. Mild irregularity in the mid vessel with no more than 30% stenosis. Large acute marginal branch without significant stenosis. PDA and PLA branches are small without significant disease.  Left ventriculography: deferred  Final Conclusions:   1. Patent coronary arteries with minor nonobstructive CAD 2. Essentially normal hemodynamics  Recommendations: Resume oral lasix and resume apixaban in  the am. Increased beta-blocker was ordered this am for better rate-control.    ETT 04/10/2016 Study Highlights     Blood pressure demonstrated a hypertensive response to exercise.  ST segment depression was noted during stress in the II, III, aVF, V5 and V6 leads.   ETT with poor exercise tolerance (5: 05); no chest pain; hypertensive BP response; pt in atrial fibrillation during the study; significant artifact; 1-2 mm ST depression in the inferolateral leads with peak exertion felt to be abnormal.       Echo 04/16/2016 LV EF: 55% -   60%  Study Conclusions  - Left ventricle: The cavity size was normal. Systolic function was   normal. The estimated ejection fraction was in the range of 55%   to 60%. Wall motion was normal; there were no regional wall   motion abnormalities. - Aortic valve: There was mild regurgitation. - Left atrium: The atrium was mildly dilated. - Atrial septum: No defect or patent foramen ovale was identified. - Pericardium, extracardiac: Small posteriro pericardial effusion   ASSESSMENT:    1. Dizziness   2. Essential hypertension   3. Chronic atrial fibrillation ([place])   4. Coronary artery disease involving native coronary artery of native heart without angina pectoris   5. Medication management      PLAN:  In order of problems listed above:  1. Dizziness: Will obtain CBC and a TSH to rule out significant causes. So far he only had 2 episodes, one was yesterday and the other episode was several months ago. Given the infrequency of the episode, I will hold off on event monitor at this time unless there is any recurrence. Blood pressure has been elevated, I will increase the diltiazem to 240 mg daily for better rate control.  2. CAD: History of minimal CAD on previous cardiac catheterization in February 2014. He has been off of Lipitor, he will need to restart this medication. Fasting lipid panel and LFTs in 2 months.  3. Chronic atrial  fibrillation: On eliquis  5 mg twice a day. Will increase diltiazem to 240 mg daily for better rate control.  4. Hypertension: Blood pressure was normal last month, however since Monday, pending his blood pressure has been going up. I amm hesitant to aggressively control the blood pressure as the underlying cause for sudden labile blood pressure is unclear.    Medication Adjustments/Labs and Tests Ordered: Current medicines are reviewed at length with the patient today.  Concerns regarding medicines are outlined above.  Medication changes, Labs and Tests ordered today are listed in the Patient Instructions below. Patient Instructions  Medication Instructions: INCREASE the Diltiazem to 240 mg tablet daily. Please start taking the Atorvastatin   If you need a refill on your cardiac medications before your next appointment, please call your pharmacy.   Labwork: Your physician recommends that you have the following drawn today: CBC, TSH Your physician recommends that you return for lab work in: 2 months to have a FASTING lipid and LFT.   Follow-Up: Your physician wants you to follow-up in: 2-3 months with Dr. Percival Spanish. You will receive a reminder letter in the mail two months in advance. If you don't receive a letter, please call our office to schedule this follow-up appointment.   Thank you for choosing Heartcare at [facility], [name], [place]  10/17/2016 10:01 PM    [name], [place], [place]  [address] Phone: [phone]; Fax: [phone]

## 2016-10-17 NOTE — Telephone Encounter (Signed)
Mr.Bourbon is calling because he is in AFIB and its getting worse. Please call

## 2016-10-18 LAB — CBC
HEMOGLOBIN: 16.4 g/dL (ref 13.0–17.7)
Hematocrit: 47 % (ref 37.5–51.0)
MCH: 30.4 pg (ref 26.6–33.0)
MCHC: 34.9 g/dL (ref 31.5–35.7)
MCV: 87 fL (ref 79–97)
Platelets: 218 10*3/uL (ref 150–379)
RBC: 5.4 x10E6/uL (ref 4.14–5.80)
RDW: 13.6 % (ref 12.3–15.4)
WBC: 8.9 10*3/uL (ref 3.4–10.8)

## 2016-10-18 LAB — TSH: TSH: 2.77 u[IU]/mL (ref 0.450–4.500)

## 2016-10-18 NOTE — Progress Notes (Signed)
TSH normal

## 2016-10-22 ENCOUNTER — Other Ambulatory Visit: Payer: Self-pay | Admitting: Physician Assistant

## 2016-10-22 NOTE — Telephone Encounter (Signed)
Indication for chronic opioid: Osteoarthritis and Lumbago Medication and dose: Hydrocodone-APAP 10/325 mg # pills per month: #75 Last UDS date: 06/01/16 Pain contract signed (Y/N): Yes 05/30/16 Date narcotic database last reviewed (include red flags): No on 06/20/16

## 2016-10-22 NOTE — Telephone Encounter (Signed)
Pt needs refill on HYDROcodone. Pt states that his wife will p/u tomorrow. She has an appt with Einar Pheasant.

## 2016-10-23 MED ORDER — HYDROCODONE-ACETAMINOPHEN 10-325 MG PO TABS: 1.0000 | ORAL_TABLET | Freq: Four times a day (QID) | ORAL | 0 refills | Status: DC | PRN

## 2016-10-23 MED FILL — HYDROCODON-APAP 10-325: 10-325 | 19 days supply | Qty: 75 | Fill #0

## 2016-10-23 NOTE — Telephone Encounter (Signed)
Database reviewed today 10/23/16. No red flags. Will scan report into EMR. Patient due for next follow-up for pain meds in [place].  Rx printed. Ready for pickup.

## 2016-10-31 ENCOUNTER — Ambulatory Visit (INDEPENDENT_AMBULATORY_CARE_PROVIDER_SITE_OTHER): Payer: Medicare HMO | Admitting: Physician Assistant

## 2016-10-31 ENCOUNTER — Encounter: Payer: Self-pay | Admitting: Physician Assistant

## 2016-10-31 VITALS — BP 118/70 | HR 55 | Temp 97.9°F | Resp 14 | Ht 72.0 in | Wt 213.0 lb

## 2016-10-31 DIAGNOSIS — F329 Major depressive disorder, single episode, unspecified: Secondary | ICD-10-CM

## 2016-10-31 DIAGNOSIS — F419 Anxiety disorder, unspecified: Secondary | ICD-10-CM

## 2016-10-31 DIAGNOSIS — Z23 Encounter for immunization: Secondary | ICD-10-CM | POA: Diagnosis not present

## 2016-10-31 NOTE — Patient Instructions (Addendum)
Please continue Lexapro as directed. I am glad it is working better for you.  Please continue current regimen as directed. You can take 2 tablets (40 mg) of the generic viagra as directed for ED.  Follow-up with me in 6 months.

## 2016-10-31 NOTE — Progress Notes (Signed)
Patient presents to clinic today for follow-up of anxiety/depression. At last vivint, patient was taken off of Cymbalta and placed back on Lexapro as he felt it was working better. Since last visit, he endorses taking medications as directed without side effects. Notes improvement in mood and sleepiness. Denies SI/HI.   Past Medical History:  Diagnosis Date  . Ankylosing spondylitis ([place])   . Arthritis   . CAD (coronary artery disease) 4193,7902   30% mid LAD lesion on cardiac cath  . Chicken pox   . Cholelithiasis   . GERD (gastroesophageal reflux disease)   . [place] measles   . Hiatal hernia   . Hypertension   . Nephrolithiasis   . Persistent atrial fibrillation ([place])   . Sleep apnea   . 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 Ventricular hypertrophy     Current Outpatient Prescriptions on File Prior to Visit  Medication Sig Dispense Refill  . allopurinol (ZYLOPRIM) 100 MG tablet Take 1 tablet (100 mg total) by mouth daily. 90 tablet 1  . apixaban (ELIQUIS) 5 MG TABS tablet Take 1 tablet (5 mg total) by mouth 2 (two) times daily. 60 tablet 5  . atorvastatin (LIPITOR) 10 MG tablet Take 1 tablet (10 mg total) by mouth daily. 90 tablet 1  . diltiazem (CARDIZEM CD) 240 MG 24 hr capsule Take 1 capsule (240 mg total) by mouth daily. 90 capsule 3  . escitalopram (LEXAPRO) 10 MG tablet Take 1 tablet (10 mg total) by mouth daily. 30 tablet 1  . furosemide (LASIX) 20 MG tablet Take 1 tablet (20 mg total) by mouth daily. 90 tablet 1  . HYDROcodone-acetaminophen (NORCO) 10-325 MG tablet Take 1 tablet by mouth every 6 (six) hours as needed. 75 tablet 0  . Loperamide HCl (IMODIUM PO) Take by mouth as needed. Reported on 05/10/2015    . metoprolol succinate (TOPROL-XL) 100 MG 24 hr tablet TAKE 1 TABLET TWICE DAILY 180 tablet 2  . omeprazole (PRILOSEC) 40 MG capsule Take 1 capsule (40 mg total) by mouth daily. 30 capsule 5  . Potassium Chloride ER 20 MEQ TBCR Take 20 mEq by mouth daily. 90 tablet 1    . sildenafil (REVATIO) 20 MG tablet Take 1 tablet as needed for ED. Do not take more than 1 dose in 36 hours. 30 tablet 0  . tiZANidine (ZANAFLEX) 4 MG tablet TAKE 1 TABLET BY MOUTH EVERY 8 HOURS AS NEEDED FOR MUSCLE SPASMS. 90 tablet 0   No current facility-administered medications on file prior to visit.     Allergies  Allergen Reactions  . Coumadin [Warfarin] Other (See Comments)    GI bleeds    Family History  Problem Relation Age of Onset  . Heart disease Father 53       Deceased  . Heart attack Father 56  . Heart disease Brother 41       "Died in his sleep"  . Arthritis/Rheumatoid Mother        Deceased-86  . Hyperlipidemia Brother   . Stroke Brother 62       Deceased  . Other Sister        Estate agent in [place]  . Other Sister        MVA  . Other Brother        Carjacked-killed  . Dementia Paternal Grandmother   . Cancer Paternal Uncle   . Healthy Brother   . Healthy Son        X3  . Healthy Daughter  x1  . Colon cancer Neg Hx     Social History   Social History  . Marital status: Married    Spouse name: N/A  . Number of children: 6  . Years of education: N/A   Occupational History  . Retired Retired    Estate manager/land agent   Social History Main Topics  . Smoking status: Never Smoker  . Smokeless tobacco: Never Used  . Alcohol use No  . Drug use: No  . Sexual activity: Not Asked   Other Topics Concern  . None   Social History Narrative   4 caffeine drinks daily    Review of Systems - See HPI.  All other ROS are negative.  BP 118/70   Pulse (!) 55   Temp 97.9 F (36.6 C) (Oral)   Resp 14   Ht 6' (1.829 m)   Wt 213 lb (96.6 kg)   SpO2 97%   BMI 28.89 kg/m   Physical Exam  Constitutional: He is oriented to person, place, and time and well-developed, well-nourished, and in no distress.  HENT:  Head: Normocephalic and atraumatic.  Eyes: Conjunctivae are normal.  Neck: Neck supple.  Cardiovascular: Normal rate, regular rhythm, normal  heart sounds and intact distal pulses.   Pulmonary/Chest: Effort normal and breath sounds normal. No respiratory distress. He has no wheezes. He has no rales. He exhibits no tenderness.  Neurological: He is alert and oriented to person, place, and time.  Skin: Skin is warm and dry. No rash noted.  Psychiatric: Affect normal.  Vitals reviewed.   Recent Results (from the past 2160 hour(s))  CBC     Status: None   Collection Time: 10/17/16 10:46 AM  Result Value Ref Range   WBC 8.9 3.4 - 10.8 x10E3/uL   RBC 5.40 4.14 - 5.80 x10E6/uL   Hemoglobin 16.4 13.0 - 17.7 g/dL   Hematocrit 47.0 37.5 - 51.0 %   MCV 87 79 - 97 fL   MCH 30.4 26.6 - 33.0 pg   MCHC 34.9 31.5 - 35.7 g/dL   RDW 13.6 12.3 - 15.4 %   Platelets 218 150 - 379 x10E3/uL  TSH     Status: None   Collection Time: 10/17/16 10:46 AM  Result Value Ref Range   TSH 2.770 0.450 - 4.500 uIU/mL    Assessment/Plan: Anxiety and depression Doing better with change back to Lexapro. Will continue same. Follow-up in 6 months.     Leeanne Rio, PA-C

## 2016-10-31 NOTE — Progress Notes (Signed)
Pre visit review using our clinic review tool, if applicable. No additional management support is needed unless otherwise documented below in the visit note. 

## 2016-10-31 NOTE — Assessment & Plan Note (Signed)
Doing better with change back to Lexapro. Will continue same. Follow-up in 6 months.

## 2016-11-09 ENCOUNTER — Telehealth: Payer: Self-pay | Admitting: Cardiology

## 2016-11-09 NOTE — Telephone Encounter (Signed)
New Message     Patient calling the office for samples of medication:   1.  What medication and dosage are you requesting samples for? 5 mg Eliquis  2.  Are you currently out of this medication? A couple days left

## 2016-11-09 NOTE — Telephone Encounter (Signed)
We have no Eliquis samples available. Patient stated that he'd call back next week.

## 2016-11-16 ENCOUNTER — Other Ambulatory Visit: Payer: Self-pay | Admitting: Cardiology

## 2016-11-16 NOTE — Telephone Encounter (Signed)
Patient calling the office for samples of medication:   1.  What medication and dosage are you requesting samples for?  Eliquis  2.  Are you currently out of this medication? Just  a few    

## 2016-11-16 NOTE — Telephone Encounter (Signed)
Samples at the front desk  Pt ntoified

## 2016-11-20 ENCOUNTER — Other Ambulatory Visit: Payer: Self-pay | Admitting: Physician Assistant

## 2016-11-20 NOTE — Telephone Encounter (Signed)
Patient requesting Rx for Hydrocodone. Please call.

## 2016-11-21 MED ORDER — HYDROCODONE-ACETAMINOPHEN 10-325 MG PO TABS: 1.0000 | ORAL_TABLET | Freq: Four times a day (QID) | ORAL | 0 refills | Status: DC | PRN

## 2016-11-21 NOTE — Telephone Encounter (Signed)
Advised patient the rx for Hydrocodone is ready for pick up at the front desk

## 2016-11-21 NOTE — Telephone Encounter (Signed)
Indication for chronic opioid: Ankylosing Spondylitis/Arthritis Medication and dose:Norco 10/325 mg # pills per month: 75 Last UDS date: 06/01/16 Pain contract signed (Y/N): 05/30/16 Date narcotic database last reviewed (include red flags):  06/20/16   Please advise

## 2016-11-21 NOTE — Telephone Encounter (Signed)
Last OV 10/31/2016. CSC up-to-date. Needs update next month. UDS up-to-date. Database again reviewed today (11/21/16). No red flags. Will scan report into EMR.  Refill granted. Rx printed, signed and ready for pick up.

## 2016-11-26 MED FILL — HYDROCODON-APAP 10-325: 10-325 | 19 days supply | Qty: 75 | Fill #0

## 2016-12-03 ENCOUNTER — Telehealth: Payer: Self-pay | Admitting: Cardiology

## 2016-12-03 NOTE — Telephone Encounter (Signed)
Patient calling the office for samples of medication: ° ° °1.  What medication and dosage are you requesting samples for? Eliquis 5 mg  ° °2.  Are you currently out of this medication? yes ° ° °

## 2016-12-03 NOTE — Telephone Encounter (Signed)
Returned call and left detailed message (ok per DPR)-no samples available at this time.  Advised to call with further questions or concerns.

## 2016-12-07 ENCOUNTER — Telehealth: Payer: Self-pay | Admitting: Cardiology

## 2016-12-07 NOTE — Telephone Encounter (Signed)
New message ° ° ° °Patient calling the office for samples of medication: ° ° °1.  What medication and dosage are you requesting samples for? apixaban (ELIQUIS) 5 MG TABS tablet ° °2.  Are you currently out of this medication? no ° ° °

## 2016-12-07 NOTE — Telephone Encounter (Signed)
Medication samples have been provided to the patient.  Drug name: Eliquis 5 mg  Qty: 28   LOT: [id number]  Exp.Date: 3/21  Samples left at front desk for patient pick-up. Patient notified.

## 2016-12-10 ENCOUNTER — Encounter: Payer: Self-pay | Admitting: Cardiology

## 2016-12-24 ENCOUNTER — Ambulatory Visit: Payer: Medicare HMO | Admitting: Cardiology

## 2016-12-24 ENCOUNTER — Telehealth: Payer: Self-pay | Admitting: Physician Assistant

## 2016-12-24 DIAGNOSIS — R52 Pain, unspecified: Secondary | ICD-10-CM

## 2016-12-24 NOTE — Telephone Encounter (Signed)
Copied from [place] [id number]. Topic: Quick Communication - See Telephone Encounter >> Dec 24, 2016  3:52 PM Boyd Kerbs wrote: CRM for notification. See Telephone encounter for:  patient called asking for Hydrocodone prescription.  Please call when ready.   12/24/16.

## 2016-12-24 NOTE — Telephone Encounter (Signed)
Pt  Requesting   A  Rx  For  Hydrocodone

## 2016-12-25 ENCOUNTER — Other Ambulatory Visit: Payer: Self-pay | Admitting: Physician Assistant

## 2016-12-25 DIAGNOSIS — Z0283 Encounter for blood-alcohol and blood-drug test: Secondary | ICD-10-CM

## 2016-12-25 DIAGNOSIS — Z79899 Other long term (current) drug therapy: Secondary | ICD-10-CM

## 2016-12-25 MED ORDER — HYDROCODONE-ACETAMINOPHEN 10-325 MG PO TABS: 1.0000 | ORAL_TABLET | Freq: Four times a day (QID) | ORAL | 0 refills | Status: DC | PRN

## 2016-12-25 NOTE — Telephone Encounter (Signed)
Addendum: Last UDS date: 06/01/2016 -- Needs repeat UDS prior to Rx pickup. Pain contract signed (Y/N): Y Date narcotic database last reviewed (include red flags): 12/25/2016 (prior to today -- last check 11/21/16). No red flags. Copy printed to be scanned into chart.

## 2016-12-25 NOTE — Telephone Encounter (Signed)
Called patient to inform him medication is ready for pick up.  Will need to give urine sample for UDS at time of pick up. UDS ordered.

## 2016-12-25 NOTE — Telephone Encounter (Signed)
Indication for chronic opioid:Ankylosing spondylitis Medication and dose: Hydrocodone 10/325 # pills per month: 75 Last UDS date: 06/01/16 moderate risk Pain contract signed (Y/N): Yes 05/30/16 Date narcotic database last reviewed (include red flags): 06/20/16   Please advise

## 2016-12-26 MED FILL — HYDROCODON-APAP 10-325: 10-325 | 19 days supply | Qty: 75 | Fill #0

## 2016-12-27 ENCOUNTER — Telehealth: Payer: Self-pay | Admitting: Cardiology

## 2016-12-27 NOTE — Telephone Encounter (Signed)
Patient made aware samples are available  Medication Samples have been provided to the patient.  Drug name: Eliquis        Strength: 5 mg         Qty: 2 boxes   LOT: [id number]   Exp.Date: 3/21

## 2016-12-27 NOTE — Telephone Encounter (Signed)
New message    Patient calling the office for samples of medication:   1.  What medication and dosage are you requesting samples for? apixaban (ELIQUIS) 5 MG TABS tablet  2.  Are you currently out of this medication? NO

## 2017-01-10 ENCOUNTER — Telehealth: Payer: Self-pay | Admitting: Cardiology

## 2017-01-10 NOTE — Telephone Encounter (Signed)
Patient calling the office for samples of medication:   1.  What medication and dosage are you requesting samples for? eliquis 5mg   2.  Are you currently out of this medication? Enough to get him through the weekend

## 2017-01-14 NOTE — Telephone Encounter (Signed)
Left message for patient no samples available at this time. He is to call if script needed.

## 2017-01-15 ENCOUNTER — Other Ambulatory Visit: Payer: Self-pay | Admitting: Physician Assistant

## 2017-01-17 ENCOUNTER — Other Ambulatory Visit: Payer: Self-pay | Admitting: Cardiology

## 2017-01-17 NOTE — Telephone Encounter (Signed)
Returned the phone call to the patient to inform him that we are currently out of Eliquis samples. He verbalized his understanding.

## 2017-01-17 NOTE — Telephone Encounter (Signed)
Patient calling the office for samples of medication:   1.  What medication and dosage are you requesting samples for? Eliquis  2.  Are you currently out of this medication? yes    

## 2017-01-18 MED FILL — ELIQUIS 5 MG TABLET: 5 | 7 days supply | Qty: 14 | Fill #0

## 2017-01-23 ENCOUNTER — Other Ambulatory Visit: Payer: Self-pay | Admitting: Physician Assistant

## 2017-01-24 ENCOUNTER — Telehealth: Payer: Self-pay | Admitting: Physician Assistant

## 2017-01-24 ENCOUNTER — Telehealth: Payer: Self-pay | Admitting: Cardiology

## 2017-01-24 NOTE — Telephone Encounter (Signed)
Copied from [place] [id number]. Topic: Quick Communication - See Telephone Encounter >> Jan 24, 2017  1:12 PM Cleaster Corin, [place] wrote: CRM for notification. See Telephone encounter for:   01/24/17. Pt calling and needed a refill on med. Hydrocodone-acetaminophen. Pt can be reached at [phone]

## 2017-01-24 NOTE — Telephone Encounter (Signed)
Patient aware there are no samples of eliquis available at this time. He has enough medication thru Monday. He is aware we are open on Monday and can call back to check on samples.

## 2017-01-24 NOTE — Telephone Encounter (Signed)
New message ° °Patient calling the office for samples of medication: ° ° °1.  What medication and dosage are you requesting samples for?apixaban (ELIQUIS) 5 MG TABS tablet ° °2.  Are you currently out of this medication? yes ° ° ° °

## 2017-01-24 NOTE — Telephone Encounter (Signed)
PCP is out of office. Routing to Dr. Jonni Sanger to see if she is okay with filling this medicaiton.  If not, patient will have to wait until PCP returns to office.

## 2017-01-25 MED ORDER — HYDROCODONE-ACETAMINOPHEN 10-325 MG PO TABS: 1.0000 | ORAL_TABLET | Freq: Four times a day (QID) | ORAL | 0 refills | Status: DC | PRN

## 2017-01-25 MED FILL — HYDROCODON-APAP 10-325: 10-325 | 19 days supply | Qty: 75 | Fill #0

## 2017-01-25 NOTE — Telephone Encounter (Signed)
Patient has been informed that refill was sent to pharmacy.

## 2017-01-25 NOTE — Telephone Encounter (Addendum)
Relation to pt: self  Call back number: [phone]   Reason for call:  Patient checking on the status of covering [facility] decision, please advise

## 2017-01-25 NOTE — Telephone Encounter (Signed)
Rx refilled.  Chart reviewed.  Can notify pt

## 2017-01-25 NOTE — Addendum Note (Signed)
Addended by: Billey Chang on: 01/25/2017 12:50 PM   Modules accepted: Orders

## 2017-01-28 ENCOUNTER — Telehealth: Payer: Self-pay | Admitting: Cardiology

## 2017-01-28 NOTE — Telephone Encounter (Signed)
New Message       *STAT* If patient is at the pharmacy, call can be transferred to refill team.   1. Which medications need to be refilled? (please list name of each medication and dose if known)  diltiazem (CARDIZEM CD) 240 MG 24 hr capsule Take 1 capsule (240 mg total) by mouth daily.     2. Which pharmacy/location (including street and city if local pharmacy) is medication to be sent to? [facility]   3. Do they need a 30 day or 90 day supply?  [place]

## 2017-01-28 NOTE — Telephone Encounter (Signed)
New Message ° °Patient calling the office for samples of medication: ° ° °1.  What medication and dosage are you requesting samples for? Eliquis 5mg  ° °2.  Are you currently out of this medication? yes ° ° ° °

## 2017-01-28 NOTE — Telephone Encounter (Signed)
New Message  Patient called again 01/28/17 about getting samples   Patient calling the office for samples of medication:   1.  What medication and dosage are you requesting samples for? Eliquis 5mg   2.  Are you currently out of this medication? yes

## 2017-01-30 MED FILL — ELIQUIS 5 MG TABLET: 5 | 7 days supply | Qty: 14 | Fill #1

## 2017-02-04 ENCOUNTER — Telehealth: Payer: Self-pay | Admitting: Cardiology

## 2017-02-04 NOTE — Telephone Encounter (Signed)
New message  ° ° °Patient calling the office for samples of medication: ° ° °1.  What medication and dosage are you requesting samples for?apixaban (ELIQUIS) 5 MG TABS tablet ° °2.  Are you currently out of this medication?  Yes  ° ° °

## 2017-02-04 NOTE — Telephone Encounter (Signed)
LM that samples are available for pick up  Medication samples have been provided to the patient.  Drug name: Eliquis 5mg   Qty: 2 boxes  LOT: [id number]  Exp.Date: 03/2019   [name] 12:02 PM 02/04/2017

## 2017-02-21 ENCOUNTER — Other Ambulatory Visit: Payer: Self-pay | Admitting: Cardiology

## 2017-02-21 ENCOUNTER — Other Ambulatory Visit: Payer: Self-pay | Admitting: *Deleted

## 2017-02-21 MED ORDER — APIXABAN 5 MG PO TABS: 5.0000 mg | ORAL_TABLET | Freq: Two times a day (BID) | ORAL | 0 refills | Status: DC

## 2017-02-21 MED ORDER — DILTIAZEM HCL ER COATED BEADS 240 MG PO CP24: 240.0000 mg | ORAL_CAPSULE | Freq: Every day | ORAL | 1 refills | Status: DC

## 2017-02-21 NOTE — Telephone Encounter (Signed)
°*  STAT* If patient is at the pharmacy, call can be transferred to refill team.   1. Which medications need to be refilled? (please list name of each medication and dose if known) Diltiazem 240mg   2. Which pharmacy/location (including street and city if local pharmacy) is medication to be sent to?[facility]  3. Do they need a 30 day or 90 day supply? 90 and refills

## 2017-02-26 ENCOUNTER — Other Ambulatory Visit: Payer: Self-pay | Admitting: Cardiology

## 2017-02-26 ENCOUNTER — Other Ambulatory Visit: Payer: Self-pay | Admitting: Physician Assistant

## 2017-02-26 NOTE — Telephone Encounter (Signed)
Patient must be seen every 3 months for chronic pain medication to be filled. He is due for an appointment.

## 2017-02-26 NOTE — Telephone Encounter (Signed)
Last Filled: 01/25/17, quant 75, 0 refills Last OV: 10/31/16 - due to follow-up in April 2019.

## 2017-02-26 NOTE — Telephone Encounter (Signed)
Copied from [place] [phone]. Topic: Quick Communication - Rx Refill/Question >> Feb 26, 2017 11:16 AM Synthia Innocent wrote: Medication:  HYDROcodone-acetaminophen ([place]) 10-325 MG tablet    Has the patient contacted their pharmacy? No, states dr must fax   (Agent: If no, request that the patient contact the pharmacy for the refill.)   Preferred Pharmacy (with phone number or street name): [place]: Please be advised that RX refills may take up to 3 business days. We ask that you follow-up with your pharmacy.

## 2017-02-26 NOTE — Telephone Encounter (Signed)
Patient coming in on Thursday at 11:30

## 2017-02-28 ENCOUNTER — Other Ambulatory Visit: Payer: Self-pay | Admitting: Physician Assistant

## 2017-02-28 ENCOUNTER — Ambulatory Visit: Payer: Medicare HMO | Admitting: Physician Assistant

## 2017-02-28 ENCOUNTER — Telehealth: Payer: Self-pay | Admitting: Emergency Medicine

## 2017-02-28 NOTE — Telephone Encounter (Signed)
FYI Copied from [place]. Topic: Quick Communication - Appointment Cancellation >> Feb 28, 2017  9:24 AM Synthia Innocent wrote: Patient called to cancel appointment scheduled for 02/28/17. Patient has rescheduled their appointment to 03/01/17 Has another drs appt   Route to department's PEC pool.

## 2017-02-28 NOTE — Telephone Encounter (Signed)
Noted  

## 2017-03-01 ENCOUNTER — Ambulatory Visit (INDEPENDENT_AMBULATORY_CARE_PROVIDER_SITE_OTHER): Payer: Medicare HMO | Admitting: Physician Assistant

## 2017-03-01 ENCOUNTER — Encounter: Payer: Self-pay | Admitting: Physician Assistant

## 2017-03-01 ENCOUNTER — Other Ambulatory Visit: Payer: Self-pay

## 2017-03-01 ENCOUNTER — Ambulatory Visit: Payer: Medicare HMO | Admitting: Physician Assistant

## 2017-03-01 VITALS — BP 121/83 | HR 80 | Temp 98.2°F | Resp 16 | Ht 72.0 in | Wt 212.2 lb

## 2017-03-01 DIAGNOSIS — G8929 Other chronic pain: Secondary | ICD-10-CM | POA: Insufficient documentation

## 2017-03-01 DIAGNOSIS — M79672 Pain in left foot: Secondary | ICD-10-CM | POA: Diagnosis not present

## 2017-03-01 MED ORDER — HYDROCODONE-ACETAMINOPHEN 10-325 MG PO TABS: 1.0000 | ORAL_TABLET | Freq: Four times a day (QID) | ORAL | 0 refills | Status: DC | PRN

## 2017-03-01 MED ORDER — PREDNISONE 10 MG PO TABS: ORAL_TABLET | ORAL | 0 refills | Status: DC

## 2017-03-01 MED FILL — predniSONE 10 MG TABS: 10 | 6 days supply | Qty: 12 | Fill #0

## 2017-03-01 MED FILL — HYDROCODON-APAP 10-325: 10-325 | 19 days supply | Qty: 75 | Fill #0

## 2017-03-01 NOTE — Progress Notes (Signed)
Indication for chronic opioid: Ankylosing Spondylitis Medication and dose: Hydrocodone-acetaminophen 10-325 q6h PRN # pills per month: 60-90 Last UDS date: 03/01/2017 Pain contract signed (date): 05/30/2016 Date narcotic database last reviewed (include red flags): 03/01/2017. No red flags.  Pain Inventory (1-10 worse): Average Pain 5/10 Pain Right Now 3/10 My pain is constant, aggravating (character i.e. sharp, stabbing, dull, constant etc)  Pain is worse with: walking, stairs Relief from Meds: yes good relief  In the last 24 hours, has pain interfered with the following (1-10 greatest interference) ? General activity walking distance Relation with others no Enjoyment of life no What TIME of day is your pain at its worst? Any time after walking       Sleep (in general) yes  Mobility/Function: Assistance device: bar near toilet  How many minutes can you walk?  Ability to climb steps?  can walk upstairs, but increases pain Do you drive? yes Disabled (date): no Neuro/Psych Sx: (bladder, bowel, weakness, dizziness, depression etc) Denies  Past Medical History:  Diagnosis Date  . Ankylosing spondylitis ([place])   . Arthritis   . CAD (coronary artery disease) 6962,9528   30% mid LAD lesion on cardiac cath  . Chicken pox   . Cholelithiasis   . GERD (gastroesophageal reflux disease)   . [place] measles   . Hiatal hernia   . Hypertension   . Nephrolithiasis   . Persistent atrial fibrillation ([place])   . Sleep apnea   . 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 Ventricular hypertrophy     Current Outpatient Medications on File Prior to Visit  Medication Sig Dispense Refill  . allopurinol (ZYLOPRIM) 100 MG tablet TAKE 1 TABLET EVERY DAY 90 tablet 1  . apixaban (ELIQUIS) 5 MG TABS tablet Take 1 tablet (5 mg total) by mouth 2 (two) times daily. 28 tablet 0  . atorvastatin (LIPITOR) 10 MG tablet Take 1 tablet (10 mg total) by mouth daily. 90 tablet 1  . diltiazem (CARDIZEM CD) 240 MG 24 hr  capsule Take 1 capsule (240 mg total) by mouth daily. 90 capsule 1  . escitalopram (LEXAPRO) 10 MG tablet Take 1 tablet (10 mg total) by mouth daily. 30 tablet 1  . furosemide (LASIX) 20 MG tablet TAKE 1 TABLET EVERY DAY 90 tablet 1  . HYDROcodone-acetaminophen (NORCO) 10-325 MG tablet Take 1 tablet by mouth every 6 (six) hours as needed. 75 tablet 0  . KLOR-CON M20 20 MEQ tablet TAKE 1 TABLET EVERY DAY 90 tablet 1  . Loperamide HCl (IMODIUM PO) Take by mouth as needed. Reported on 05/10/2015    . metoprolol succinate (TOPROL-XL) 100 MG 24 hr tablet TAKE 1 TABLET TWICE DAILY 180 tablet 2  . omeprazole (PRILOSEC) 40 MG capsule Take 1 capsule (40 mg total) by mouth daily. 30 capsule 5  . Potassium Chloride ER 20 MEQ TBCR Take 20 mEq by mouth daily. 90 tablet 1  . sildenafil (REVATIO) 20 MG tablet Take 1 tablet as needed for ED. Do not take more than 1 dose in 36 hours. 30 tablet 0  . tiZANidine (ZANAFLEX) 4 MG tablet TAKE 1 TABLET BY MOUTH EVERY 8 HOURS AS NEEDED FOR MUSCLE SPASMS. 90 tablet 0   No current facility-administered medications on file prior to visit.     Allergies  Allergen Reactions  . Coumadin [Warfarin] Other (See Comments)    GI bleeds    Family History  Problem Relation Age of Onset  . Heart disease Father 70  Deceased  . Heart attack Father 10  . Heart disease Brother 33       "Died in his sleep"  . Arthritis/Rheumatoid Mother        Deceased-86  . Hyperlipidemia Brother   . Stroke Brother 29       Deceased  . Other Sister        Estate agent in [place]  . Other Sister        MVA  . Other Brother        Carjacked-killed  . Dementia Paternal Grandmother   . Cancer Paternal Uncle   . Healthy Brother   . Healthy Son        X3  . Healthy Daughter        x1  . Colon cancer Neg Hx     Social History   Socioeconomic History  . Marital status: Married    Spouse name: None  . Number of children: 6  . Years of education: None  . Highest education level: None    Social Needs  . Financial resource strain: None  . Food insecurity - worry: None  . Food insecurity - inability: None  . Transportation needs - medical: None  . Transportation needs - non-medical: None  Occupational History  . 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 Smoking status: Never Smoker  . Smokeless tobacco: Never Used  Substance and Sexual Activity  . Alcohol use: No  . Drug use: No  . Sexual activity: None  Other Topics Concern  . None  Social History Narrative   4 caffeine drinks daily    Review of Systems - See HPI.  All other ROS are negative.  BP 121/83   Pulse 80   Temp 98.2 F (36.8 C) (Oral)   Resp 16   Ht 6' (1.829 m)   Wt 212 lb 4 oz (96.3 kg)   SpO2 98%   BMI 28.79 kg/m   Physical Exam  Constitutional: He is oriented to person, place, and time and well-developed, well-nourished, and in no distress.  HENT:  Head: Normocephalic and atraumatic.  Eyes: Conjunctivae are normal.  Neck: Neck supple.  Cardiovascular: Normal rate, regular rhythm, normal heart sounds and intact distal pulses.  Pulmonary/Chest: Effort normal and breath sounds normal.  Musculoskeletal:       Feet:  Neurological: He is alert and oriented to person, place, and time. No cranial nerve deficit.  Skin: Skin is warm and dry. No rash noted.  Psychiatric: Affect normal.  Vitals reviewed.  Assessment/Plan: 1. Encounter for chronic pain management Up-to-date on requirements. Is tolerating well. Repeat UDS obtained as due. Due for updated CSC in May. CS Database reviewed and no red flags. Medication refill given.  - Pain Mgmt, Profile 8 w/Conf, U  2. Left foot pain Start small prednisone burst for inflammation. RICE. Follow-up if symptoms are not resolving.  - predniSONE (DELTASONE) 10 MG tablet; Take 3 tablets by mouth x 2 days, then 2 tablets x 2 days, then 1 tablet x 2 days.  Dispense: 12 tablet; Refill: 0   Leeanne Rio, PA-C

## 2017-03-01 NOTE — Patient Instructions (Signed)
Please go to the lab for urine testing. Your prescriptions have been sent to the pharmacy. Let me know if foot pain  Is not resolving as we will need further assessment.  Follow-up with me in 3 months.  Return sooner if needed.

## 2017-03-05 LAB — PAIN MGMT, PROFILE 8 W/CONF, U
6 Acetylmorphine: NEGATIVE ng/mL (ref <10)
ALCOHOL METABOLITES: NEGATIVE ng/mL (ref <500)
Amphetamines: NEGATIVE ng/mL (ref <500)
BENZODIAZEPINES: NEGATIVE ng/mL (ref <100)
Buprenorphine, Urine: NEGATIVE ng/mL (ref <5)
COCAINE METABOLITE: NEGATIVE ng/mL (ref <150)
CODEINE: NEGATIVE ng/mL (ref <50)
CREATININE: 177.3 mg/dL
Hydrocodone: 3010 ng/mL — ABNORMAL HIGH (ref <50)
Hydromorphone: 91 ng/mL — ABNORMAL HIGH (ref <50)
MARIJUANA METABOLITE: NEGATIVE ng/mL (ref <20)
MDMA: NEGATIVE ng/mL (ref <500)
Morphine: NEGATIVE ng/mL (ref <50)
Norhydrocodone: 2103 ng/mL — ABNORMAL HIGH (ref <50)
OPIATES: POSITIVE ng/mL — AB (ref <100)
OXYCODONE: NEGATIVE ng/mL (ref <100)
Oxidant: NEGATIVE ug/mL (ref <200)
PH: 6.08 (ref 4.5–9.0)

## 2017-03-08 MED FILL — ELIQUIS 5 MG TABLET: 5 | 30 days supply | Qty: 60 | Fill #0

## 2017-03-19 ENCOUNTER — Telehealth: Payer: Self-pay | Admitting: Cardiology

## 2017-03-19 NOTE — Telephone Encounter (Signed)
Medication samples have been provided to the patient.  Drug name: Eliquis 5 mg  Qty: 28 tablets    LOT: [id number]  Exp.Date: 6/21  Samples left at front desk for patient pick-up. Patient notified.

## 2017-03-19 NOTE — Telephone Encounter (Signed)
Patient calling the office for samples of medication:   1.  What medication and dosage are you requesting samples for? apixaban (ELIQUIS) 5 MG TABS tablet  2.  Are you currently out of this medication? Pt has one day left

## 2017-03-26 ENCOUNTER — Other Ambulatory Visit: Payer: Self-pay | Admitting: Physician Assistant

## 2017-03-26 NOTE — Telephone Encounter (Signed)
Copied from [place]. Topic: Quick Communication - Rx Refill/Question >> Mar 26, 2017  4:38 PM Robina Ade, Helene Kelp D wrote: Medication: HYDROcodone-acetaminophen ([place]) 10-325 MG tablet   Has the patient contacted their pharmacy? No control substance   (Agent: If no, request that the patient contact the pharmacy for the refill.)   Preferred Pharmacy (with phone number or street name): [place], [place]: Please be advised that RX refills may take up to 3 business days. We ask that you follow-up with your pharmacy.

## 2017-03-27 NOTE — Telephone Encounter (Signed)
LOV: 03/01/17  PCP: [place]: [place]

## 2017-03-29 ENCOUNTER — Other Ambulatory Visit: Payer: Self-pay | Admitting: Physician Assistant

## 2017-03-29 NOTE — Telephone Encounter (Signed)
Last refill 03/01/17, 75 with 0

## 2017-03-29 NOTE — Telephone Encounter (Signed)
Copied from [place] [id number]. Topic: Quick Communication - Rx Refill/Question >> Mar 29, 2017  9:01 AM Pricilla Handler wrote: Medication: HYDROcodone-acetaminophen ([place]) 10-325 MG tablet Has the patient contacted their pharmacy? Yes.   (Agent: If no, request that the patient contact the pharmacy for the refill.) Preferred Pharmacy (with phone number or street name): [place], [place] - [address]. [phone] (Phone) [phone] (Fax)   Agent: Please be advised that RX refills may take up to 3 business days. We ask that you follow-up with your pharmacy.

## 2017-03-29 NOTE — Telephone Encounter (Signed)
LOV:03/01/17  PCP: Dr. Raiford Noble, PA  Pharmacy: [facility]

## 2017-04-01 MED ORDER — HYDROCODONE-ACETAMINOPHEN 10-325 MG PO TABS: 1.0000 | ORAL_TABLET | Freq: Four times a day (QID) | ORAL | 0 refills | Status: DC | PRN

## 2017-04-01 MED FILL — HYDROCODON-APAP 10-325: 10-325 | 19 days supply | Qty: 75 | Fill #0

## 2017-04-01 NOTE — Telephone Encounter (Signed)
Indication for chronic opioid: Ankylosing Spondylitis, OA of multiple joints Medication and dose: Norco 10-325 mg # pills per month: 75 Last UDS date: 03/01/17 Pain contract signed (Y/N): Y Date narcotic database last reviewed (include red flags): 04/01/2017  Rx sent electronically.

## 2017-04-02 ENCOUNTER — Other Ambulatory Visit: Payer: Self-pay | Admitting: Cardiology

## 2017-04-02 NOTE — Telephone Encounter (Signed)
Returned call to patient Eliquis 5 mg samples left at [facility] office front desk. 

## 2017-04-02 NOTE — Telephone Encounter (Signed)
Patient calling the office for samples of medication: ° ° °1.  What medication and dosage are you requesting samples for? Eliquis ° °2.  Are you currently out of this medication?  A few ° ° ° °

## 2017-04-18 ENCOUNTER — Other Ambulatory Visit: Payer: Self-pay | Admitting: Cardiology

## 2017-04-18 NOTE — Telephone Encounter (Signed)
Patient calling the office for samples of medication:   1.  What medication and dosage are you requesting samples for? Eliquis 50 mg   2.  Are you currently out of this medication?

## 2017-04-18 NOTE — Telephone Encounter (Signed)
Left a message to call back.  Medication samples have been provided to the patient.  Drug name: Eliquis       Strength: 5 mg        Qty: 3 boxes  LOT: [id number]  Exp.Date: 6/21

## 2017-04-19 NOTE — Telephone Encounter (Signed)
Left a message to call back  Samples are available at the front.

## 2017-05-03 ENCOUNTER — Other Ambulatory Visit: Payer: Self-pay | Admitting: Physician Assistant

## 2017-05-03 MED FILL — HYDROCODON-APAP 10-325: 10-325 | 19 days supply | Qty: 75 | Fill #0

## 2017-05-03 NOTE — Telephone Encounter (Signed)
Indication for chronic opioid: AS, Chronic back pain.  Medication and dose: Norco 10-325 mg Last UDS date: 03/01/2017 Pain contract signed (Y/N): 05/2016. Due for updated CSC at next follow-up. Date narcotic database last reviewed (include red flags): Last month

## 2017-05-07 ENCOUNTER — Telehealth: Payer: Self-pay | Admitting: Cardiology

## 2017-05-07 NOTE — Telephone Encounter (Signed)
Patient calling the office for samples of medication:   1.  What medication and dosage are you requesting samples for? Eliquis 5 mg   2.  Are you currently out of this medication? Not quite ( have enough to last the rest of this week)

## 2017-05-07 NOTE — Telephone Encounter (Signed)
Medication samples have been provided to the patient.  Drug name: Eliquis 5 mg Qty:1box LOT: [id number]  Exp.Date: 6/21  Samples left at front desk for patient pick-up. Patient notified.

## 2017-05-15 ENCOUNTER — Telehealth: Payer: Self-pay | Admitting: Physician Assistant

## 2017-05-15 NOTE — Telephone Encounter (Signed)
New Message ° ° °Patient calling the office for samples of medication: ° ° °1.  What medication and dosage are you requesting samples for? apixaban (ELIQUIS) 5 MG TABS tablet ° °2.  Are you currently out of this medication?  yes ° ° °

## 2017-05-15 NOTE — Telephone Encounter (Signed)
Call pt and informed sample medication is available for pick at front desk. Eliquis 5 mg Qty: 2 boxes Lot # [id number] Exp: 3/21

## 2017-05-31 ENCOUNTER — Telehealth: Payer: Self-pay | Admitting: Cardiology

## 2017-05-31 ENCOUNTER — Other Ambulatory Visit: Payer: Self-pay | Admitting: Physician Assistant

## 2017-05-31 IMAGING — DX DG CHEST 2V
2 series · 2 of 2 positions shown · non-contrast
Comparison: CT chest 08/30/2014

CLINICAL DATA: Fluttering in the chest.  Headache.

EXAM:
CHEST  2 VIEW

[chest pa]
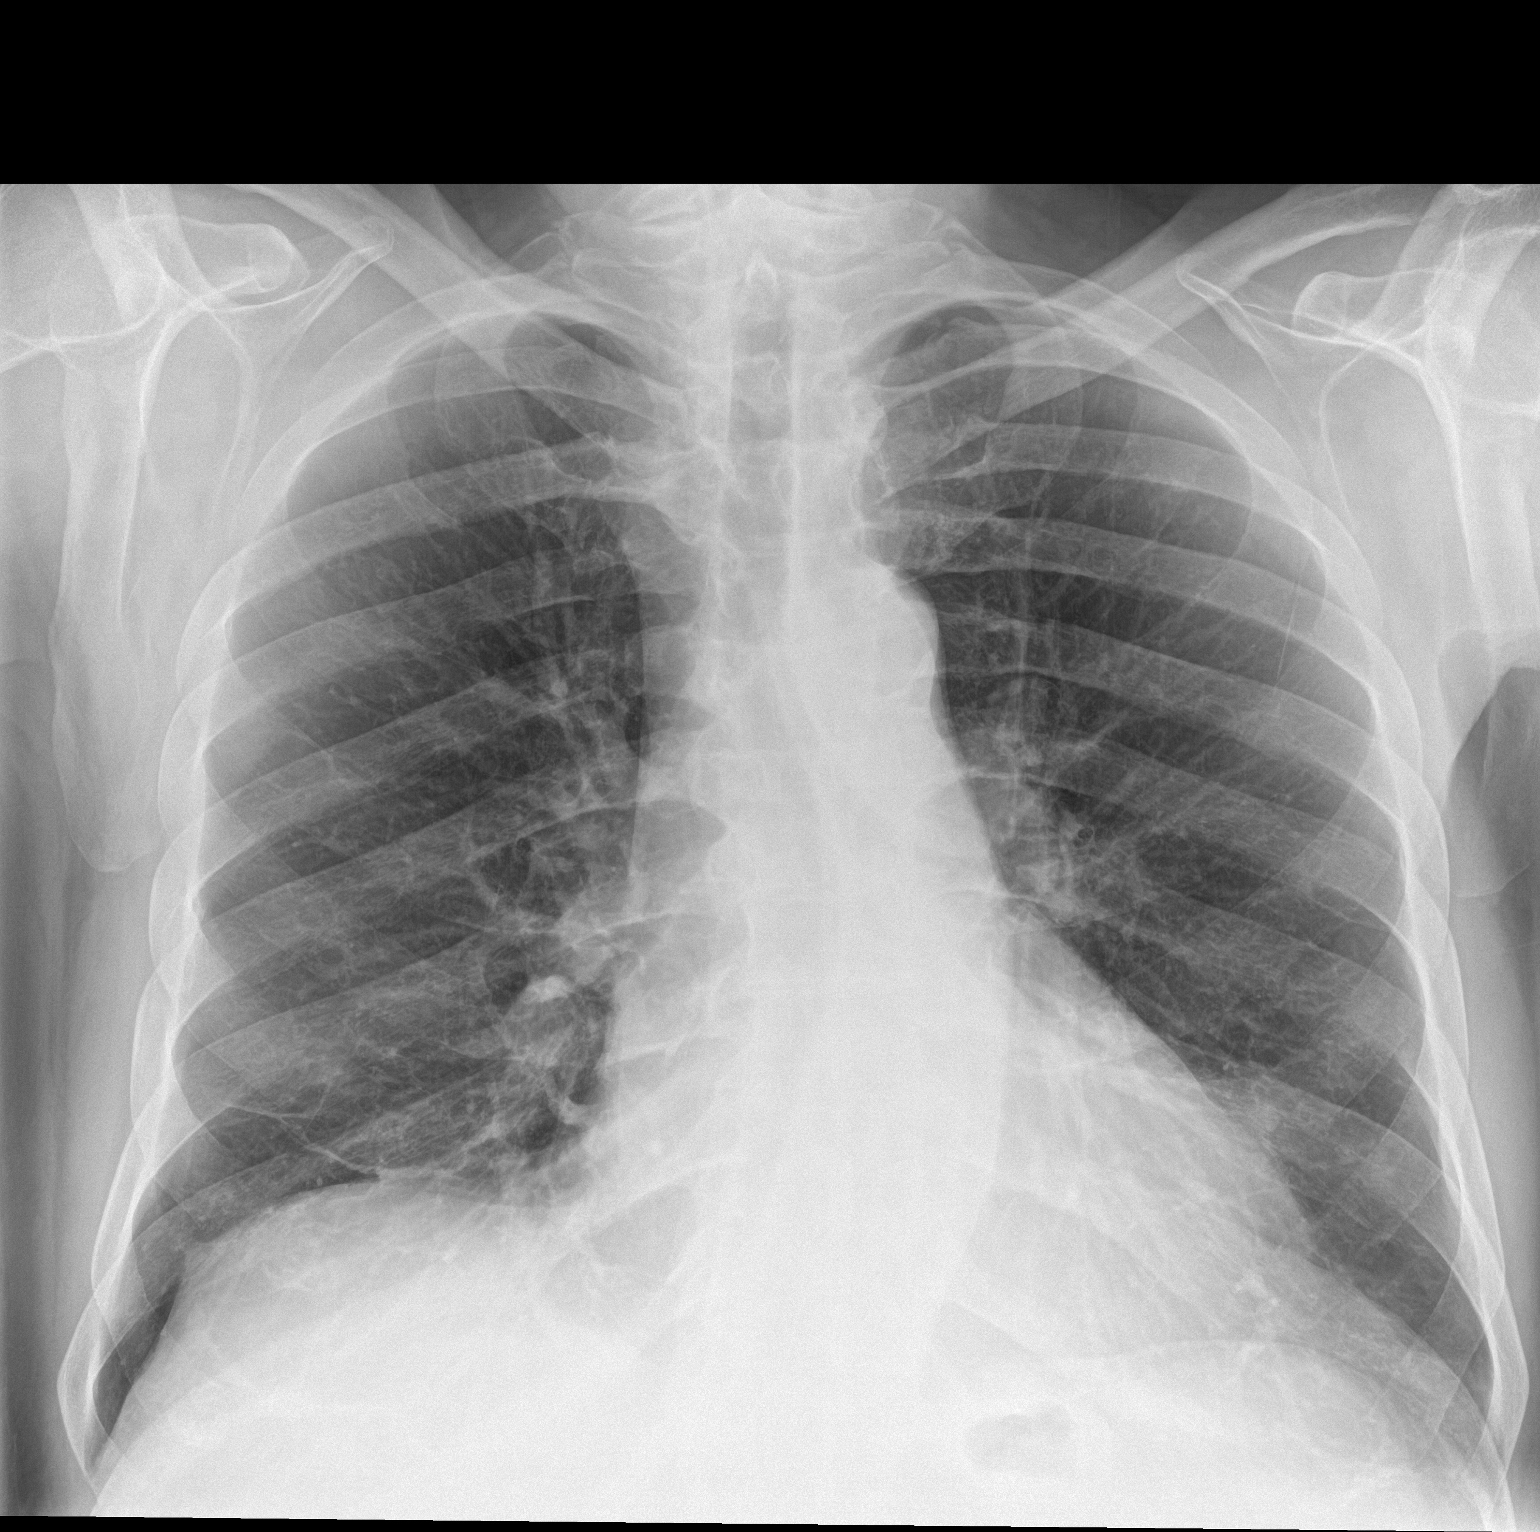

[chest lat]
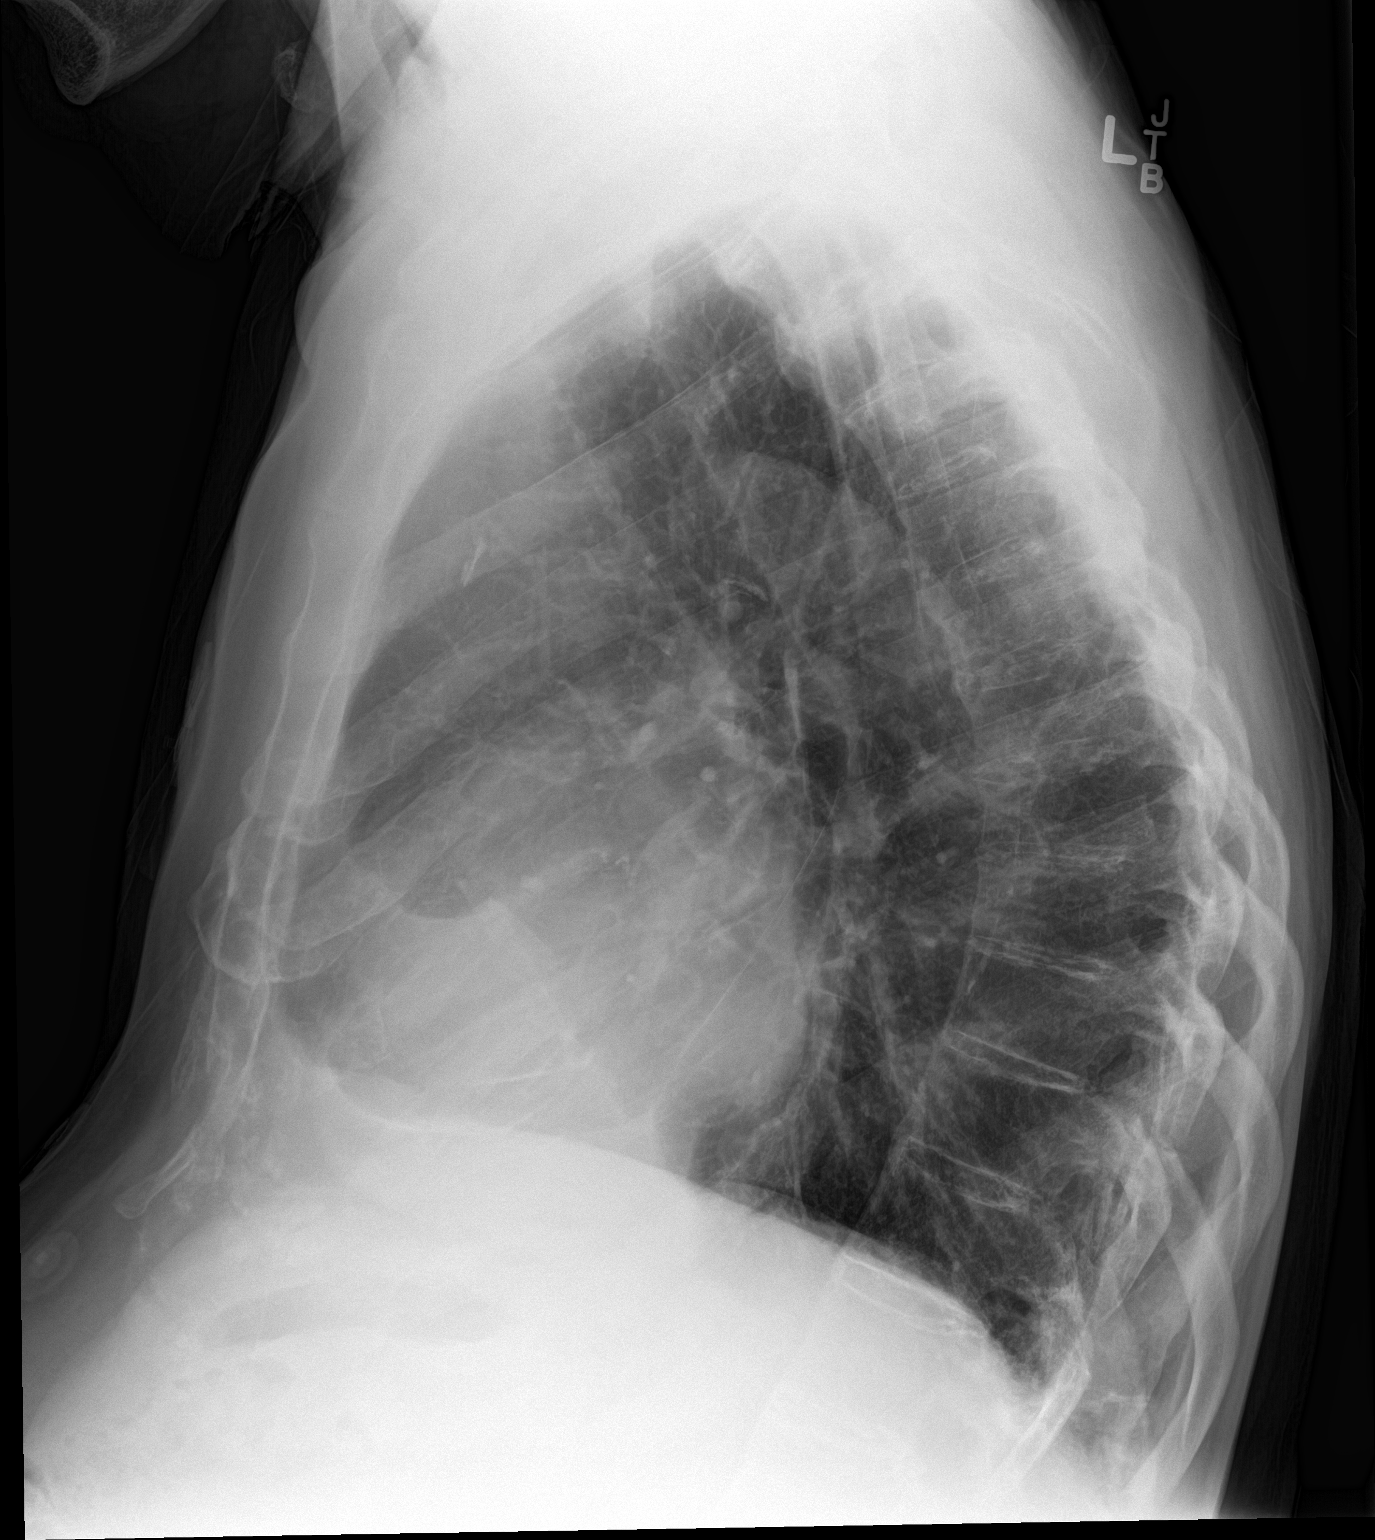

[2 of 2 positions shown; findings below may reference images not displayed]

FINDINGS: Mild right basilar scarring. There is no focal parenchymal opacity.
There is no pleural effusion or pneumothorax. The heart and
mediastinal contours are unremarkable.

The osseous structures are unremarkable.
IMPRESSION: No active cardiopulmonary disease.

## 2017-05-31 MED FILL — HYDROCODON-APAP 10-325: 10-325 | 19 days supply | Qty: 75 | Fill #0

## 2017-05-31 NOTE — Telephone Encounter (Signed)
Last OV 03/01/17, No future OV  Last filled 05/03/17, # 75 with 0 refills

## 2017-05-31 NOTE — Telephone Encounter (Signed)
Drug name: apixaban (ELIQUIS) 5 MG TABS tablet                  Qty: 3 boxes             LOT: [id number]               Exp.Date: 06-2019 Samples left at front desk for patient pick-up. Patient notified

## 2017-05-31 NOTE — Telephone Encounter (Signed)
New Message   Patient calling the office for samples of medication:   1.  What medication and dosage are you requesting samples for? apixaban (ELIQUIS) 5 MG TABS tablet  2.  Are you currently out of this medication? Just enough for 3 days

## 2017-06-10 ENCOUNTER — Other Ambulatory Visit: Payer: Self-pay | Admitting: Physician Assistant

## 2017-06-26 ENCOUNTER — Telehealth: Payer: Self-pay | Admitting: Cardiology

## 2017-06-26 NOTE — Telephone Encounter (Signed)
New Message   Patient calling the office for samples of medication:   1.  What medication and dosage are you requesting samples for? apixaban (ELIQUIS) 5 MG TABS tablet  2.  Are you currently out of this medication? A weeks worth

## 2017-06-26 NOTE — Telephone Encounter (Signed)
Patient made aware that we are currently out of Eliquis samples. He will try back on Friday.

## 2017-07-01 ENCOUNTER — Telehealth: Payer: Self-pay | Admitting: Cardiology

## 2017-07-01 ENCOUNTER — Other Ambulatory Visit: Payer: Self-pay | Admitting: Physician Assistant

## 2017-07-01 ENCOUNTER — Encounter: Payer: Self-pay | Admitting: Emergency Medicine

## 2017-07-01 NOTE — Telephone Encounter (Signed)
Patient aware samples are at the front desk for pick up  

## 2017-07-01 NOTE — Telephone Encounter (Signed)
Indication for chronic opioid:  Ankylosing spondylitis Medication and dose: Norco 10/325 mg # pills per month: 75 on 05/31/17 Last UDS date: 03/01/17 Opioid Treatment Agreement signed (Y/N): 05/30/16 Opioid Treatment Agreement last reviewed with patient:  03/01/17 NCCSRS reviewed this encounter (include red flags):  11/21/16  Please advise

## 2017-07-01 NOTE — Telephone Encounter (Signed)
My chart message sent to patient.advsising he is due for an appointment every 3 months for his chronic medication

## 2017-07-01 NOTE — Telephone Encounter (Signed)
New Message:       Pt is calling to see if we have any samples of Eliquis

## 2017-07-08 ENCOUNTER — Other Ambulatory Visit: Payer: Self-pay | Admitting: Cardiology

## 2017-07-12 ENCOUNTER — Telehealth: Payer: Self-pay | Admitting: Cardiology

## 2017-07-12 NOTE — Telephone Encounter (Signed)
Spoke with pt and advised that there is no samples available at this time and to try to call back next week. Verbalized understanding.

## 2017-07-12 NOTE — Telephone Encounter (Signed)
New Message    Patient calling the office for samples of medication:   1.  What medication and dosage are you requesting samples for?apixaban (ELIQUIS) 5 MG TABS tablet  2.  Are you currently out of this medication? No has two left

## 2017-07-16 ENCOUNTER — Other Ambulatory Visit: Payer: Self-pay | Admitting: Physician Assistant

## 2017-07-17 ENCOUNTER — Telehealth: Payer: Self-pay | Admitting: Cardiology

## 2017-07-17 NOTE — Telephone Encounter (Signed)
Message left for patient that samples are available at the front.   Medication Samples have been provided to the patient.  Drug name: Eliquis       Strength: 5 mg         Qty: 2 boxes  LOT: [id number]  Exp.Date: 6/21

## 2017-07-17 NOTE — Telephone Encounter (Signed)
Pt calling  Patient calling the office for samples of medication:   1.  What medication and dosage are you requesting samples for?Eliquis 5mg   2.  Are you currently out of this medication? Pt didn't say

## 2017-07-19 ENCOUNTER — Ambulatory Visit (INDEPENDENT_AMBULATORY_CARE_PROVIDER_SITE_OTHER): Payer: Medicare HMO | Admitting: Physician Assistant

## 2017-07-19 ENCOUNTER — Other Ambulatory Visit: Payer: Self-pay

## 2017-07-19 ENCOUNTER — Encounter: Payer: Self-pay | Admitting: Physician Assistant

## 2017-07-19 VITALS — BP 130/76 | HR 63 | Temp 97.7°F | Resp 15 | Ht 72.0 in | Wt 216.4 lb

## 2017-07-19 DIAGNOSIS — L03115 Cellulitis of right lower limb: Secondary | ICD-10-CM | POA: Diagnosis not present

## 2017-07-19 MED ORDER — DOXYCYCLINE HYCLATE 100 MG PO CAPS: 100.0000 mg | ORAL_CAPSULE | Freq: Two times a day (BID) | ORAL | 0 refills | Status: DC

## 2017-07-19 MED FILL — DOXYCYCLINE HYCLATE 100 MG: 100 | 7 days supply | Qty: 14 | Fill #0

## 2017-07-19 NOTE — Progress Notes (Signed)
Patient presents to clinic today c/o redness and tenderness at site of a scraping wound of RLE over the past few days. Notes about 10 days or so ago a cabinet that he was carrying fell out of his hands and scraped his R leg. Has been keeping clean and dry. Had initially noted improvement in tenderness but over the past few days has become red and very tender. Notes wound is scabbed over. Denies and drainage. Denies fever, chills or malaise.   Past Medical History:  Diagnosis Date  . Ankylosing spondylitis ([place])   . Arthritis   . CAD (coronary artery disease) 0932,6712   30% mid LAD lesion on cardiac cath  . Chicken pox   . Cholelithiasis   . GERD (gastroesophageal reflux disease)   . [place] measles   . Hiatal hernia   . Hypertension   . Nephrolithiasis   . Persistent atrial fibrillation ([place])   . Sleep apnea   . 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 Ventricular hypertrophy     Current Outpatient Medications on File Prior to Visit  Medication Sig Dispense Refill  . allopurinol (ZYLOPRIM) 100 MG tablet TAKE 1 TABLET EVERY DAY 90 tablet 1  . apixaban (ELIQUIS) 5 MG TABS tablet Take 1 tablet (5 mg total) by mouth 2 (two) times daily. 28 tablet 0  . atorvastatin (LIPITOR) 10 MG tablet Take 1 tablet (10 mg total) by mouth daily. 90 tablet 1  . CARTIA XT 240 MG 24 hr capsule TAKE 1 CAPSULE EVERY DAY 90 capsule 1  . escitalopram (LEXAPRO) 10 MG tablet Take 1 tablet (10 mg total) by mouth daily. 30 tablet 1  . furosemide (LASIX) 20 MG tablet TAKE 1 TABLET EVERY DAY 90 tablet 1  . HYDROcodone-acetaminophen (NORCO) 10-325 MG tablet TAKE 1 TABLET BY MOUTH EVERY 6 HOURS AS NEEDED. 75 tablet 0  . KLOR-CON M20 20 MEQ tablet TAKE 1 TABLET EVERY DAY 90 tablet 1  . Loperamide HCl (IMODIUM PO) Take by mouth as needed. Reported on 05/10/2015    . metoprolol succinate (TOPROL-XL) 100 MG 24 hr tablet TAKE 1 TABLET TWICE DAILY 180 tablet 2  . omeprazole (PRILOSEC) 40 MG capsule Take 1 capsule (40 mg total)  by mouth daily. 30 capsule 5  . Potassium Chloride ER 20 MEQ TBCR Take 20 mEq by mouth daily. 90 tablet 1  . sildenafil (REVATIO) 20 MG tablet Take 1 tablet as needed for ED. Do not take more than 1 dose in 36 hours. 30 tablet 0  . tiZANidine (ZANAFLEX) 4 MG tablet TAKE 1 TABLET BY MOUTH EVERY 8 HOURS AS NEEDED FOR MUSCLE SPASMS. 90 tablet 0   No current facility-administered medications on file prior to visit.     Allergies  Allergen Reactions  . Coumadin [Warfarin] Other (See Comments)    GI bleeds    Family History  Problem Relation Age of Onset  . Heart disease Father 83       Deceased  . Heart attack Father 6  . Heart disease Brother 17       "Died in his sleep"  . Arthritis/Rheumatoid Mother        Deceased-86  . Hyperlipidemia Brother   . Stroke Brother 41       Deceased  . Other Sister        Estate agent in [place]  . Other Sister        MVA  . Other Brother        Carjacked-killed  .  Dementia Paternal Grandmother   . Cancer Paternal Uncle   . Healthy Brother   . Healthy Son        X3  . Healthy Daughter        x1  . Colon cancer Neg Hx     Social History   Socioeconomic History  . Marital status: Married    Spouse name: Not on file  . Number of children: 6  . Years of education: Not on file  . Highest education level: Not on file  Occupational History  . 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 Financial resource strain: Not on file  . Food insecurity:    Worry: Not on file    Inability: Not on file  . Transportation needs:    Medical: Not on file    Non-medical: Not on file  Tobacco Use  . Smoking status: Never Smoker  . Smokeless tobacco: Never Used  Substance and Sexual Activity  . Alcohol use: No  . Drug use: No  . Sexual activity: Not on file  Lifestyle  . Physical activity:    Days per week: Not on file    Minutes per session: Not on file  . Stress: Not on file  Relationships  . Social  connections:    Talks on phone: Not on file    Gets together: Not on file    Attends religious service: Not on file    Active member of club or organization: Not on file    Attends meetings of clubs or organizations: Not on file    Relationship status: Not on file  Other Topics Concern  . Not on file  Social History Narrative   4 caffeine drinks daily    Review of Systems - See HPI.  All other ROS are negative.  BP 130/76   Pulse 63   Temp 97.7 F (36.5 C) (Oral)   Resp 15   Ht 6' (1.829 m)   Wt 216 lb 6.4 oz (98.2 kg)   SpO2 98%   BMI 29.35 kg/m   Physical Exam  Constitutional: He is oriented to person, place, and time. He appears well-developed and well-nourished.  HENT:  Head: Normocephalic and atraumatic.  Cardiovascular: Normal rate, regular rhythm and normal heart sounds.  Pulmonary/Chest: Effort normal.  Neurological: He is alert and oriented to person, place, and time.  Skin:     Vitals reviewed.  Assessment/Plan: 1. Cellulitis of right leg With healing wound. Start Doxycycline BID x 7 days. Supportive measures reviewed. Close follow-up recommended. Appt to be scheduled for Monday. ER precautions given.   Leeanne Rio, PA-C

## 2017-07-19 NOTE — Patient Instructions (Signed)
Please keep skin clean and dry. Wash daily with soap and water. Pat dry. Apply thin layer of Vaseline to protect.  Take the antibiotic as directed with food.  Follow-up with me Monday for reassessment and to follow-up for chronic pain medication (must be done every 3 months).  ER for any worsening symptoms over the weekend.

## 2017-07-21 NOTE — Progress Notes (Signed)
Cardiology Office Note   Date:  07/22/2017   ID:  [name] Woods, DOB [birth year], MRN [id number]  PCP:  Brunetta Jeans, PA-C  Cardiologist:   No primary care provider on file.   Chief Complaint  Patient presents with  . Follow-up      History of Present Illness: [name] Woods is a 77 y.o. male who presents for follow up of chronic atrial fibrillation, minimal CAD and HTN. He was noted to have very mild nonobstructive CAD by cath on 03/24/2012. EF was mildly reduced at 45% at time. Repeat 2-D echo in June 2015 revealed improved EF of 55%. He had a ETT on 04/10/2016 that showed ST segment depression in lead 2, 3, aVF, V5 and V6, poor exercise tolerance. Echocardiogram obtained on 04/16/2016 showed EF 55-60%, mild AI, small posterior pericardial effusion.   He had presyncope and was last seen by [place] in Sept.  He had two episodes and it was decided to watch and wait to see if he had any more episodes.    Since he was last seen he has not had any of the dizziness.  He has Cardizem increased.  He is actually done relatively well.  His blood pressure is elevated today but he says this is unusual.  Did not take his meds this morning. The patient denies any new symptoms such as chest discomfort, neck or arm discomfort. There has been no new shortness of breath, PND or orthopnea. There have been no reported palpitations, presyncope or syncope.     Past Medical History:  Diagnosis Date  . Ankylosing spondylitis ([place])   . Arthritis   . CAD (coronary artery disease) 3846,6599   30% mid LAD lesion on cardiac cath  . Chicken pox   . Cholelithiasis   . GERD (gastroesophageal reflux disease)   . [place] measles   . Hiatal hernia   . Hypertension   . Nephrolithiasis   . Persistent atrial fibrillation ([place])   . Sleep apnea   . 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 Ventricular hypertrophy     Past Surgical History:  Procedure Laterality Date  . CARDIAC CATHETERIZATION  03/2012   30% mid  LAD lesion otherwise normal cors, LVEF 65%  . CHOLECYSTECTOMY    . ELBOW ARTHROPLASTY     Left  . LEFT AND RIGHT HEART CATHETERIZATION WITH CORONARY ANGIOGRAM N/A 03/24/2012   Procedure: LEFT AND RIGHT HEART CATHETERIZATION WITH CORONARY ANGIOGRAM;  Surgeon: Sherren Mocha, MD;  Location: [facility] CATH LAB;  Service: Cardiovascular;  Laterality: N/A;  . LEG FLUID REMOVAL RIGHT    . TONSILLECTOMY       Current Outpatient Medications  Medication Sig Dispense Refill  . allopurinol (ZYLOPRIM) 100 MG tablet TAKE 1 TABLET EVERY DAY 90 tablet 1  . apixaban (ELIQUIS) 5 MG TABS tablet Take 1 tablet (5 mg total) by mouth 2 (two) times daily. 28 tablet 0  . atorvastatin (LIPITOR) 10 MG tablet Take 1 tablet (10 mg total) by mouth daily. 90 tablet 1  . CARTIA XT 240 MG 24 hr capsule TAKE 1 CAPSULE EVERY DAY 90 capsule 1  . doxycycline (VIBRAMYCIN) 100 MG capsule Take 1 capsule (100 mg total) by mouth 2 (two) times daily. 14 capsule 0  . escitalopram (LEXAPRO) 10 MG tablet Take 1 tablet (10 mg total) by mouth daily. 30 tablet 1  . furosemide (LASIX) 20 MG tablet TAKE 1 TABLET EVERY DAY 90 tablet 1  . HYDROcodone-acetaminophen (NORCO) 10-325 MG tablet  TAKE 1 TABLET BY MOUTH EVERY 6 HOURS AS NEEDED. 75 tablet 0  . KLOR-CON M20 20 MEQ tablet TAKE 1 TABLET EVERY DAY 90 tablet 1  . Loperamide HCl (IMODIUM PO) Take by mouth as needed. Reported on 05/10/2015    . metoprolol succinate (TOPROL-XL) 100 MG 24 hr tablet TAKE 1 TABLET TWICE DAILY 180 tablet 2  . omeprazole (PRILOSEC) 40 MG capsule Take 1 capsule (40 mg total) by mouth daily. 30 capsule 5  . Potassium Chloride ER 20 MEQ TBCR Take 20 mEq by mouth daily. 90 tablet 1  . sildenafil (REVATIO) 20 MG tablet Take 1 tablet as needed for ED. Do not take more than 1 dose in 36 hours. 30 tablet 0  . tiZANidine (ZANAFLEX) 4 MG tablet TAKE 1 TABLET BY MOUTH EVERY 8 HOURS AS NEEDED FOR MUSCLE SPASMS. 90 tablet 0   No current facility-administered medications for this  visit.     Allergies:   Coumadin [warfarin]     ROS:  Please see the history of present illness.   Otherwise, review of systems are positive for insomnia.   All other systems are reviewed and negative.    PHYSICAL EXAM: VS:  BP (!) 160/98   Pulse 63   Ht 6' (1.829 m)   Wt 216 lb 3.2 oz (98.1 kg)   BMI 29.32 kg/m  , BMI Body mass index is 29.32 kg/m. GENERAL:  Well appearing NECK:  No jugular venous distention, waveform within normal limits, carotid upstroke brisk and symmetric, no bruits, no thyromegaly LUNGS:  Clear to auscultation bilaterally BACK:  No CVA tenderness CHEST:  Unremarkable HEART:  PMI not displaced or sustained,S1 and S2 within normal limits, no S3, no S4, no clicks, no rubs, no murmurs ABD:  Flat, positive bowel sounds normal in frequency in pitch, no bruits, no rebound, no guarding, no midline pulsatile mass, no hepatomegaly, no splenomegaly EXT:  2 plus pulses throughout, no edema, no cyanosis no clubbing    EKG:  EKG is not ordered today.    Recent Labs: 07/24/2016: ALT 12; BUN 17; Creat 1.34; Potassium 3.7; Sodium 143 10/17/2016: Hemoglobin 16.4; Platelets 218; TSH 2.770    Lipid Panel    Component Value Date/Time   CHOL 144 03/02/2016 1016   TRIG 104.0 03/02/2016 1016   HDL 36.30 (L) 03/02/2016 1016   CHOLHDL 4 03/02/2016 1016   VLDL 20.8 03/02/2016 1016   LDLCALC 87 03/02/2016 1016      Wt Readings from Last 3 Encounters:  07/22/17 216 lb 3.2 oz (98.1 kg)  07/19/17 216 lb 6.4 oz (98.2 kg)  03/01/17 212 lb 4 oz (96.3 kg)      Other studies Reviewed: Additional studies/ records that were reviewed today include: None. Review of the above records demonstrates:  Please see elsewhere in the note.     ASSESSMENT AND PLAN:  DIZZINESS:  This has resolved.  No change in therapy.   CAD:  The patient has no new sypmtoms.  No further cardiovascular testing is indicated.  We will continue with aggressive risk reduction and meds as  listed.  CHRONIC ATRIAL FIB:    [name] has a CHA2DS2 - VASc score of 3.    HTN: Blood pressure is mildly elevated today.  However, this seems to be somewhat unusual and he did not take his medicines he is going to keep a blood pressure diary.  DYSLIPIDEMIA:  I will check a lipid profile.     Current medicines  are reviewed at length with the patient today.  The patient does not have concerns regarding medicines.  The following changes have been made:  no change  Labs/ tests ordered today include:  No orders of the defined types were placed in this encounter.    Disposition:   FU with Almyra Deforest PA in six months.      Signed, Minus Breeding, MD  07/22/2017 9:07 AM    [place]

## 2017-07-22 ENCOUNTER — Other Ambulatory Visit: Payer: Self-pay

## 2017-07-22 ENCOUNTER — Ambulatory Visit (INDEPENDENT_AMBULATORY_CARE_PROVIDER_SITE_OTHER): Payer: Medicare HMO | Admitting: Physician Assistant

## 2017-07-22 ENCOUNTER — Encounter: Payer: Self-pay | Admitting: Physician Assistant

## 2017-07-22 ENCOUNTER — Encounter: Payer: Self-pay | Admitting: Cardiology

## 2017-07-22 ENCOUNTER — Ambulatory Visit: Payer: Medicare HMO | Admitting: Cardiology

## 2017-07-22 VITALS — BP 128/70 | HR 86 | Temp 97.7°F | Resp 16 | Ht 72.0 in | Wt 216.0 lb

## 2017-07-22 VITALS — BP 160/98 | HR 63 | Ht 72.0 in | Wt 216.2 lb

## 2017-07-22 DIAGNOSIS — I2510 Atherosclerotic heart disease of native coronary artery without angina pectoris: Secondary | ICD-10-CM

## 2017-07-22 DIAGNOSIS — G8929 Other chronic pain: Secondary | ICD-10-CM

## 2017-07-22 DIAGNOSIS — E785 Hyperlipidemia, unspecified: Secondary | ICD-10-CM

## 2017-07-22 DIAGNOSIS — I10 Essential (primary) hypertension: Secondary | ICD-10-CM | POA: Diagnosis not present

## 2017-07-22 LAB — HEPATIC FUNCTION PANEL
ALT: 17 IU/L (ref 0–44)
AST: 24 IU/L (ref 0–40)
Albumin: 4.2 g/dL (ref 3.5–4.8)
Alkaline Phosphatase: 75 IU/L (ref 39–117)
BILIRUBIN, DIRECT: 0.44 mg/dL — AB (ref 0.00–0.40)
Bilirubin Total: 2.1 mg/dL — ABNORMAL HIGH (ref 0.0–1.2)
TOTAL PROTEIN: 6.4 g/dL (ref 6.0–8.5)

## 2017-07-22 LAB — LIPID PANEL
CHOL/HDL RATIO: 3.4 ratio (ref 0.0–5.0)
Cholesterol, Total: 124 mg/dL (ref 100–199)
HDL: 37 mg/dL — AB (ref >39)
LDL Calculated: 72 mg/dL (ref 0–99)
TRIGLYCERIDES: 77 mg/dL (ref 0–149)
VLDL Cholesterol Cal: 15 mg/dL (ref 5–40)

## 2017-07-22 MED ORDER — ATORVASTATIN CALCIUM 10 MG PO TABS: 10.0000 mg | ORAL_TABLET | Freq: Every day | ORAL | 1 refills | Status: DC

## 2017-07-22 MED ORDER — HYDROCODONE-ACETAMINOPHEN 10-325 MG PO TABS: 1.0000 | ORAL_TABLET | Freq: Four times a day (QID) | ORAL | 0 refills | Status: DC | PRN

## 2017-07-22 MED FILL — HYDROCODON-APAP 10-325: 10-325 | 19 days supply | Qty: 75 | Fill #0

## 2017-07-22 NOTE — Progress Notes (Signed)
Reviewed and updated today.  Indication for chronic opioid: Ankylosing Spondylitis Medication and dose: Norco 10-325 mg # pills per month: 75 Last UDS date: 03/01/2017 Pain contract signed (date): 06/08/2016. Due to renew today. Date narcotic database last reviewed (include red flags): 07/22/17  Pain Inventory (1-10 worse): Average Pain -- 7-8/10 Pain Right Now -- 3/10 My pain is aching (character i.e. sharp, stabbing, dull, constant etc)  Pain is worse with: ambulation Relief from Meds: significant improvement with medication.  In the last 24 hours, has pain interfered with the following (1-10 greatest interference) ? General activity - 4 Relation with others 4 Enjoyment of life 4 What TIME of day is your pain at its worst? evening       Sleep (in general) stable  Mobility/Function: Assistance device: None How many minutes can you walk? 30 Ability to climb steps?  difficulty Do you drive? y Disabled (date): N Neuro/Psych Sx: (bladder, bowel, weakness, dizziness, depression etc) None  Prior Studies: See EMR.  Past Medical History:  Diagnosis Date  . Ankylosing spondylitis ([place])   . Arthritis   . CAD (coronary artery disease) 8889,1694   30% mid LAD lesion on cardiac cath  . Chicken pox   . Cholelithiasis   . GERD (gastroesophageal reflux disease)   . [place] measles   . Hiatal hernia   . Hypertension   . Nephrolithiasis   . Persistent atrial fibrillation ([place])   . Sleep apnea    No CPAP  . 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 Ventricular hypertrophy     Current Outpatient Medications on File Prior to Visit  Medication Sig Dispense Refill  . allopurinol (ZYLOPRIM) 100 MG tablet TAKE 1 TABLET EVERY DAY 90 tablet 1  . apixaban (ELIQUIS) 5 MG TABS tablet Take 1 tablet (5 mg total) by mouth 2 (two) times daily. 28 tablet 0  . CARTIA XT 240 MG 24 hr capsule TAKE 1 CAPSULE EVERY DAY 90 capsule 1  . doxycycline (VIBRAMYCIN) 100 MG capsule Take 1 capsule (100 mg total)  by mouth 2 (two) times daily. 14 capsule 0  . escitalopram (LEXAPRO) 10 MG tablet Take 1 tablet (10 mg total) by mouth daily. 30 tablet 1  . furosemide (LASIX) 20 MG tablet TAKE 1 TABLET EVERY DAY 90 tablet 1  . HYDROcodone-acetaminophen (NORCO) 10-325 MG tablet TAKE 1 TABLET BY MOUTH EVERY 6 HOURS AS NEEDED. 75 tablet 0  . KLOR-CON M20 20 MEQ tablet TAKE 1 TABLET EVERY DAY 90 tablet 1  . Loperamide HCl (IMODIUM PO) Take by mouth as needed. Reported on 05/10/2015    . metoprolol succinate (TOPROL-XL) 100 MG 24 hr tablet TAKE 1 TABLET TWICE DAILY 180 tablet 2  . omeprazole (PRILOSEC) 40 MG capsule Take 1 capsule (40 mg total) by mouth daily. 30 capsule 5  . Potassium Chloride ER 20 MEQ TBCR Take 20 mEq by mouth daily. 90 tablet 1  . sildenafil (REVATIO) 20 MG tablet Take 1 tablet as needed for ED. Do not take more than 1 dose in 36 hours. 30 tablet 0  . tiZANidine (ZANAFLEX) 4 MG tablet TAKE 1 TABLET BY MOUTH EVERY 8 HOURS AS NEEDED FOR MUSCLE SPASMS. 90 tablet 0   No current facility-administered medications on file prior to visit.     Allergies  Allergen Reactions  . Coumadin [Warfarin] Other (See Comments)    GI bleeds    Family History  Problem Relation Age of Onset  . Heart disease Father 52  Deceased  . Heart attack Father 53  . Heart disease Brother 80       "Died in his sleep"  . Arthritis/Rheumatoid Mother        Deceased-86  . Hyperlipidemia Brother   . Stroke Brother 3       Deceased  . Other Sister        Estate agent in [place]  . Other Sister        MVA  . Other Brother        Carjacked-killed  . Dementia Paternal Grandmother   . Cancer Paternal Uncle   . Healthy Brother   . Healthy Son        X3  . Healthy Daughter        x1  . Colon cancer Neg Hx     Social History   Socioeconomic History  . Marital status: Married    Spouse name: Not on file  . Number of children: 6  . Years of education: Not on file  . Highest education level: Not on file    Occupational History  . 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 Financial resource strain: Not on file  . Food insecurity:    Worry: Not on file    Inability: Not on file  . Transportation needs:    Medical: Not on file    Non-medical: Not on file  Tobacco Use  . Smoking status: Never Smoker  . Smokeless tobacco: Never Used  Substance and Sexual Activity  . Alcohol use: No  . Drug use: No  . Sexual activity: Not on file  Lifestyle  . Physical activity:    Days per week: Not on file    Minutes per session: Not on file  . Stress: Not on file  Relationships  . Social connections:    Talks on phone: Not on file    Gets together: Not on file    Attends religious service: Not on file    Active member of club or organization: Not on file    Attends meetings of clubs or organizations: Not on file    Relationship status: Not on file  Other Topics Concern  . Not on file  Social History Narrative   4 caffeine drinks daily    Review of Systems - See HPI.  All other ROS are negative.  BP 128/70   Pulse 86   Temp 97.7 F (36.5 C) (Oral)   Resp 16   Ht 6' (1.829 m)   Wt 216 lb (98 kg)   SpO2 98%   BMI 29.29 kg/m   Physical Exam  Constitutional: He appears well-developed and well-nourished.  Cardiovascular: Normal rate, regular rhythm and normal heart sounds.  Pulmonary/Chest: Effort normal and breath sounds normal.  Psychiatric: He has a normal mood and affect.  Vitals reviewed.  Assessment/Plan: 1. Encounter for chronic pain management CSC renewed. UDS up-to-date. CS Database reviewed with no red flags. Printout to be scanned into chart. Medications refilled. Follow-up 3 months.       Leeanne Rio, PA-C

## 2017-07-22 NOTE — Patient Instructions (Signed)
Please continue medications as directed. Follow-up with me in 3 months.  Return sooner if needed.  The nurse has refilled your cholesterol medication. I have sent in the refill of pain medication to your pharmacy.

## 2017-07-22 NOTE — Patient Instructions (Signed)
Medication Instructions:  Continue current medications  If you need a refill on your cardiac medications before your next appointment, please call your pharmacy.  Labwork: Fasting Lipid and Liver HERE IN OUR OFFICE AT [facility]  Take the provided lab slips with you to the lab for your blood draw.   You will need to fast. DO NOT EAT OR DRINK PAST MIDNIGHT.   Testing/Procedures: None Ordered  Follow-Up: Your physician wants you to follow-up in: 6 Months with Almyra Deforest. You should receive a reminder letter in the mail two months in advance. If you do not receive a letter, please call our office [phone].    Thank you for choosing [facility] at [facility]!!

## 2017-07-30 NOTE — Telephone Encounter (Signed)
New Message ° ° °Patient calling the office for samples of medication: ° ° °1.  What medication and dosage are you requesting samples for?apixaban (ELIQUIS) 5 MG TABS tablet  ° °2.  Are you currently out of this medication? Yes  ° ° ° °

## 2017-07-31 ENCOUNTER — Telehealth: Payer: Self-pay | Admitting: Cardiology

## 2017-07-31 NOTE — Telephone Encounter (Signed)
Eliquis 5 #3 exp 6/21 lot # [id number] at front desk for pick up Left message to call back

## 2017-07-31 NOTE — Telephone Encounter (Signed)
New Message    Patient calling the office for samples of medication:   1.  What medication and dosage are you requesting samples for? apixaban (ELIQUIS) 5 MG TABS tablet  2.  Are you currently out of this medication? 2 days.

## 2017-08-02 NOTE — Telephone Encounter (Signed)
LM that samples are available for pick up

## 2017-08-21 ENCOUNTER — Other Ambulatory Visit: Payer: Self-pay | Admitting: Physician Assistant

## 2017-08-21 ENCOUNTER — Telehealth: Payer: Self-pay | Admitting: Cardiology

## 2017-08-21 MED ORDER — APIXABAN 5 MG PO TABS: 5.0000 mg | ORAL_TABLET | Freq: Two times a day (BID) | ORAL | 0 refills | Status: DC

## 2017-08-21 NOTE — Telephone Encounter (Signed)
Indication for chronic opioid: Ankylosing Spondylitis Medication and dose: Norco 10-325 mg # pills per month: 75 Last UDS date: 03/01/17 Opioid Treatment Agreement signed (Y/N): Yes 07/22/17 Opioid Treatment Agreement last reviewed with patient:   NCCSRS reviewed this encounter (include red flags):

## 2017-08-21 NOTE — Telephone Encounter (Signed)
New Message:       Patient calling the office for samples of medication:   1.  What medication and dosage are you requesting samples for?apixaban (ELIQUIS) 5 MG TABS tablet  2.  Are you currently out of this medication? No

## 2017-08-22 MED FILL — HYDROCODON-APAP 10-325: 10-325 | 19 days supply | Qty: 75 | Fill #0

## 2017-08-29 ENCOUNTER — Telehealth: Payer: Self-pay | Admitting: Cardiology

## 2017-08-29 ENCOUNTER — Other Ambulatory Visit: Payer: Self-pay | Admitting: Cardiology

## 2017-08-29 MED ORDER — METOPROLOL SUCCINATE ER 100 MG PO TB24: 100.0000 mg | ORAL_TABLET | Freq: Two times a day (BID) | ORAL | 2 refills | Status: DC

## 2017-08-29 MED FILL — METOPROLOL SUCCINATE ER 100: 100 | 7 days supply | Qty: 14 | Fill #0

## 2017-08-29 NOTE — Telephone Encounter (Signed)
Patient calling the office for samples of medication:   1.  What medication and dosage are you requesting samples for? Metoprolol 100mg    2.  Are you currently out of this medication? Yes

## 2017-08-29 NOTE — Telephone Encounter (Signed)
Spoke with patient and advised no samples available. Rx for 1 week sent to [facility] and 90 day to [facility] mail order. Verified dose of Metoprolol Suc 100 mg twice a day

## 2017-08-29 NOTE — Telephone Encounter (Signed)
Spoke with Lars Mage from [place] pharmacy who was inquiring about prescription for metoprolol that was sent for only 14 tablets. Informed that per Chart review, pt normally usually [facility] and a 2 week prescription was sent to them for 2 weeks until he receive his mail order. Verbalized understanding.

## 2017-09-03 ENCOUNTER — Telehealth: Payer: Self-pay | Admitting: Cardiology

## 2017-09-03 MED ORDER — METOPROLOL SUCCINATE ER 100 MG PO TB24: 100.0000 mg | ORAL_TABLET | Freq: Two times a day (BID) | ORAL | 1 refills | Status: DC

## 2017-09-03 NOTE — Telephone Encounter (Signed)
°*  STAT* If patient is at the pharmacy, call can be transferred to refill team.   1. Which medications need to be refilled? (please list name of each medication and dose if known) Metoprolol 100mg    2. Which pharmacy/location (including street and city if local pharmacy) is medication to be sent to?[facility]  3. Do they need a 30 day or 90 day supply? Pt stated 3wks was called in instead of 74months. Pt need more medication.

## 2017-09-16 ENCOUNTER — Telehealth: Payer: Self-pay | Admitting: Cardiology

## 2017-09-16 NOTE — Telephone Encounter (Signed)
New message ° ° ° °Patient calling the office for samples of medication: ° ° °1.  What medication and dosage are you requesting samples for? apixaban (ELIQUIS) 5 MG TABS tablet ° °2.  Are you currently out of this medication? no ° ° °

## 2017-09-16 NOTE — Telephone Encounter (Signed)
Spoke with pt. Adv pt that samples for Elquis 5mg  bid will be left at the front desk for pick up.  Pt verbalized understanding and voiced appreciation.

## 2017-09-17 ENCOUNTER — Other Ambulatory Visit: Payer: Self-pay | Admitting: Physician Assistant

## 2017-09-17 NOTE — Telephone Encounter (Signed)
Last OV 07/22/17, No future OV  Last filled 08/22/17, # 75 with 0 refills

## 2017-09-19 ENCOUNTER — Other Ambulatory Visit: Payer: Self-pay | Admitting: Physician Assistant

## 2017-09-23 MED FILL — HYDROCODON-APAP 10-325: 10-325 | 20 days supply | Qty: 75 | Fill #0

## 2017-10-03 ENCOUNTER — Telehealth: Payer: Self-pay | Admitting: Cardiology

## 2017-10-03 NOTE — Telephone Encounter (Signed)
Medication samples have been provided to the patient.  Drug name: eliquis 5mg   Qty: 4 boxes  LOT: [id number]  Exp.Date: 11/2019  Samples left at front desk for patient pick-up. Patient notified.  Sheral Apley M 10:47 AM 10/03/2017

## 2017-10-03 NOTE — Telephone Encounter (Signed)
New Message ° ° °Patient calling the office for samples of medication: ° ° °1.  What medication and dosage are you requesting samples for?apixaban (ELIQUIS) 5 MG TABS tablet  ° ° °2.  Are you currently out of this medication? 2 days remaining  ° ° ° °

## 2017-10-24 DIAGNOSIS — C44519 Basal cell carcinoma of skin of other part of trunk: Secondary | ICD-10-CM | POA: Diagnosis not present

## 2017-10-24 DIAGNOSIS — S20461A Insect bite (nonvenomous) of right back wall of thorax, initial encounter: Secondary | ICD-10-CM | POA: Diagnosis not present

## 2017-10-24 DIAGNOSIS — L720 Epidermal cyst: Secondary | ICD-10-CM | POA: Diagnosis not present

## 2017-10-28 ENCOUNTER — Other Ambulatory Visit: Payer: Self-pay | Admitting: Physician Assistant

## 2017-10-28 ENCOUNTER — Telehealth: Payer: Self-pay | Admitting: Cardiology

## 2017-10-28 NOTE — Telephone Encounter (Signed)
Pt given 4 boxes of Eliquis samples.. Lot [id number]                                                              EXP 11/2019

## 2017-10-28 NOTE — Telephone Encounter (Signed)
Patient calling the office for samples of medication: ° ° °1.  What medication and dosage are you requesting samples for? ° °apixaban (ELIQUIS) 5 MG TABS tablet ° °2.  Are you currently out of this medication?  yes ° ° °

## 2017-10-28 NOTE — Telephone Encounter (Signed)
Indication for chronic opioid:  Ankylosing Spondylitis Medication and dose: Norco 10-325 mg # pills per month: 75 Last UDS date: 03/01/17 Opioid Treatment Agreement signed (Y/N): Yes Opioid Treatment Agreement last reviewed with patient:   NCCSRS reviewed this encounter (include red flags):     LOV: 07/22/17  Please advise

## 2017-10-29 ENCOUNTER — Other Ambulatory Visit: Payer: Self-pay | Admitting: Physician Assistant

## 2017-11-14 ENCOUNTER — Other Ambulatory Visit: Payer: Self-pay | Admitting: Physician Assistant

## 2017-11-14 MED FILL — HYDROCODON-APAP 10-325: 10-325 | 19 days supply | Qty: 75 | Fill #0

## 2017-11-14 NOTE — Telephone Encounter (Signed)
Last Filled: 09/17/17 #75,0 Last OV: 07/22/17 Next OV: 11/27/17

## 2017-11-21 ENCOUNTER — Other Ambulatory Visit: Payer: Self-pay | Admitting: Cardiology

## 2017-11-26 ENCOUNTER — Other Ambulatory Visit: Payer: Self-pay

## 2017-11-26 ENCOUNTER — Ambulatory Visit (INDEPENDENT_AMBULATORY_CARE_PROVIDER_SITE_OTHER): Payer: Medicare HMO | Admitting: Physician Assistant

## 2017-11-26 ENCOUNTER — Encounter: Payer: Self-pay | Admitting: Physician Assistant

## 2017-11-26 ENCOUNTER — Telehealth: Payer: Self-pay | Admitting: Physician Assistant

## 2017-11-26 VITALS — BP 138/70 | HR 61 | Temp 97.9°F | Resp 14 | Ht 72.0 in | Wt 221.0 lb

## 2017-11-26 DIAGNOSIS — Z23 Encounter for immunization: Secondary | ICD-10-CM | POA: Diagnosis not present

## 2017-11-26 DIAGNOSIS — H6121 Impacted cerumen, right ear: Secondary | ICD-10-CM | POA: Insufficient documentation

## 2017-11-26 DIAGNOSIS — G8929 Other chronic pain: Secondary | ICD-10-CM | POA: Diagnosis not present

## 2017-11-26 DIAGNOSIS — M159 Polyosteoarthritis, unspecified: Secondary | ICD-10-CM | POA: Diagnosis not present

## 2017-11-26 DIAGNOSIS — H833X3 Noise effects on inner ear, bilateral: Secondary | ICD-10-CM | POA: Insufficient documentation

## 2017-11-26 MED ORDER — CYCLOBENZAPRINE HCL 10 MG PO TABS: 10.0000 mg | ORAL_TABLET | Freq: Three times a day (TID) | ORAL | 0 refills | Status: DC | PRN

## 2017-11-26 MED FILL — CYCLOBENZAPRINE 10 MG TAB: 10 | 5 days supply | Qty: 15 | Fill #0

## 2017-11-26 NOTE — Progress Notes (Signed)
Reviewed and updated  today  Indication for chronic opioid: Ankylosing Spondylitis, OA of knees bilaterally; OA of multiple sites.  Medication and dose: Norco 10-325 mg. # pills per month: 75 Last UDS date: 03/01/17. Low risk Pain contract signed (date): Updated 07/24/17 Date narcotic database last reviewed (include red flags): 11/26/17.   Pain Inventory (1-10 worse): Average Pain 7/10 Pain Right Now 4/10 My pain is aching and sharp (character i.e. sharp, stabbing, dull, constant etc)  Pain is worse with: ambulation Relief from Meds: great  In the last 24 hours, has pain interfered with the following (1-10 greatest interference) ? General activity 4 Relation with others 4 Enjoyment of life 4 What TIME of day is your pain at its worst? evening       Sleep (in general) good  Mobility/Function: Assistance device: None How many minutes can you walk? 30 Ability to climb steps?  yes Do you drive? yes Disabled (date): N/A Neuro/Psych Sx: (bladder, bowel, weakness, dizziness, depression etc) Depression -- controlled Prior Studies: See EMR. Due for repeat imaging of knees.  Physicians involved in your care: Any changes since last visit?  None  Past Medical History:  Diagnosis Date  . Ankylosing spondylitis ([place])   . Arthritis   . CAD (coronary artery disease) 5462,7035   30% mid LAD lesion on cardiac cath  . Chicken pox   . Cholelithiasis   . GERD (gastroesophageal reflux disease)   . [place] measles   . Hiatal hernia   . Hypertension   . Nephrolithiasis   . Persistent atrial fibrillation   . Sleep apnea    No CPAP  . 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 Ventricular hypertrophy     Current Outpatient Medications on File Prior to Visit  Medication Sig Dispense Refill  . allopurinol (ZYLOPRIM) 100 MG tablet TAKE 1 TABLET EVERY DAY 90 tablet 1  . apixaban (ELIQUIS) 5 MG TABS tablet Take 1 tablet (5 mg total) by mouth 2 (two) times daily. 56 tablet 0  . atorvastatin  (LIPITOR) 10 MG tablet Take 1 tablet (10 mg total) by mouth daily. 90 tablet 1  . diltiazem (CARDIZEM CD) 240 MG 24 hr capsule TAKE 1 CAPSULE EVERY DAY (DUE FOR APPOINTMENT FOR FUTURE REFILLS) 90 capsule 1  . escitalopram (LEXAPRO) 10 MG tablet Take 1 tablet (10 mg total) by mouth daily. 30 tablet 1  . furosemide (LASIX) 20 MG tablet TAKE 1 TABLET EVERY DAY 90 tablet 1  . HYDROcodone-acetaminophen (NORCO) 10-325 MG tablet TAKE 1 TABLET BY MOUTH EVERY 6 HOURS AS NEEDED (09/21/17) 75 tablet 0  . KLOR-CON M20 20 MEQ tablet TAKE 1 TABLET EVERY DAY 90 tablet 1  . Loperamide HCl (IMODIUM PO) Take by mouth as needed. Reported on 05/10/2015    . metoprolol succinate (TOPROL-XL) 100 MG 24 hr tablet Take 1 tablet (100 mg total) by mouth 2 (two) times daily. Take with or immediately following a meal. 180 tablet 1  . omeprazole (PRILOSEC) 40 MG capsule Take 1 capsule (40 mg total) by mouth daily. 30 capsule 5  . Potassium Chloride ER 20 MEQ TBCR Take 20 mEq by mouth daily. 90 tablet 1  . sildenafil (REVATIO) 20 MG tablet TAKE 1 TABLET BY MOUTH AS NEEDED FOR ERECTILE DYSFUNCTION. DO NO TAKE MORE THAN 1 DOSE IN 36 HOURS. 30 tablet 0  . tiZANidine (ZANAFLEX) 4 MG tablet TAKE 1 TABLET BY MOUTH EVERY 8 HOURS AS NEEDED FOR MUSCLE SPASMS. 90 tablet 0   No current  facility-administered medications on file prior to visit.     Allergies  Allergen Reactions  . Coumadin [Warfarin] Other (See Comments)    GI bleeds    Family History  Problem Relation Age of Onset  . Heart disease Father 18       Deceased  . Heart attack Father 37  . Heart disease Brother 76       "Died in his sleep"  . Arthritis/Rheumatoid Mother        Deceased-86  . Hyperlipidemia Brother   . Stroke Brother 43       Deceased  . Other Sister        Estate agent in [place]  . Other Sister        MVA  . Other Brother        Carjacked-killed  . Dementia Paternal Grandmother   . Cancer Paternal Uncle   . Healthy Brother   . Healthy Son        X3    . Healthy Daughter        x1  . Colon cancer Neg Hx     Social History   Socioeconomic History  . Marital status: Married    Spouse name: Not on file  . Number of children: 6  . Years of education: Not on file  . Highest education level: Not on file  Occupational History  . 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 Financial resource strain: Not on file  . Food insecurity:    Worry: Not on file    Inability: Not on file  . Transportation needs:    Medical: Not on file    Non-medical: Not on file  Tobacco Use  . Smoking status: Never Smoker  . Smokeless tobacco: Never Used  Substance and Sexual Activity  . Alcohol use: No  . Drug use: No  . Sexual activity: Not on file  Lifestyle  . Physical activity:    Days per week: Not on file    Minutes per session: Not on file  . Stress: Not on file  Relationships  . Social connections:    Talks on phone: Not on file    Gets together: Not on file    Attends religious service: Not on file    Active member of club or organization: Not on file    Attends meetings of clubs or organizations: Not on file    Relationship status: Not on file  Other Topics Concern  . Not on file  Social History Narrative   4 caffeine drinks daily    Review of Systems - See HPI.  All other ROS are negative.  BP 138/70   Pulse 61   Temp 97.9 F (36.6 C) (Oral)   Resp 14   Ht 6' (1.829 m)   Wt 221 lb (100.2 kg)   SpO2 97%   BMI 29.97 kg/m   Physical Exam  Constitutional: He is oriented to person, place, and time. He appears well-developed and well-nourished.  HENT:  Head: Normocephalic and atraumatic.  Eyes: Conjunctivae are normal.  Cardiovascular: Normal rate, regular rhythm, normal heart sounds and intact distal pulses.  Pulmonary/Chest: Effort normal.  Neurological: He is alert and oriented to person, place, and time.  Psychiatric: He has a normal mood and affect.  Vitals  reviewed.  Assessment/Plan: 1. Encounter for chronic pain management 2. Generalized osteoarthritis of multiple sites CSC and UDS up-to-date. Database reviewed without  red flags. Due for repeat imaging of knees. Orders placed. Medications refilled. Follow-up 3 months.  - DG Knee Complete 4 Views Left; Future - DG Knee Complete 4 Views Right; Future  3. Encounter for immunization High-dose flu shot given today. - Flu vaccine HIGH DOSE PF    Leeanne Rio, PA-C

## 2017-11-26 NOTE — Telephone Encounter (Signed)
LM for patient on cell phone letting him know that medication has been sent in.

## 2017-11-26 NOTE — Telephone Encounter (Signed)
Copied from [place] [id number]. Topic: Quick Communication - Rx Refill/Question >> Nov 26, 2017  2:33 PM Margot Ables wrote: Medication: flexeril - pt called stating he checked with pharmacy and RX has not been received.  Has the patient contacted their pharmacy? Yes - have not received RX Preferred Pharmacy (with phone number or street name): [place], [place] - [address] [phone] (Phone) [phone] (Fax)

## 2017-11-26 NOTE — Telephone Encounter (Signed)
Medication resent. Should be able to pick up now.

## 2017-11-26 NOTE — Patient Instructions (Signed)
Please go to the [facility] office for x-rays of the knees. We need follow-up imaging on file.   I have sent in refills of medication along with a short course of flexeril for muscle tension in the R shoulder.   Follow-up 3 months. Check with the schedulers to see when you are due for your medicare wellness.

## 2017-11-27 ENCOUNTER — Ambulatory Visit: Payer: Medicare HMO | Admitting: Physician Assistant

## 2017-11-28 ENCOUNTER — Telehealth: Payer: Self-pay | Admitting: Cardiology

## 2017-11-28 NOTE — Telephone Encounter (Signed)
  Samples Eliquis 5mg  were given to the patient, quantity 2 boxes Lot Number [id number] Adv pt that the samples will be left at the front desk for him to pick up. Pt voiced appreciation for the assistance.

## 2017-11-28 NOTE — Telephone Encounter (Signed)
New Message         Patient calling the office for samples of medication:   1.  What medication and dosage are you requesting samples for? Eliquis 5 mg  2.  Are you currently out of this medication? Not quite out

## 2017-12-05 ENCOUNTER — Other Ambulatory Visit: Payer: Self-pay | Admitting: Physician Assistant

## 2017-12-05 DIAGNOSIS — I2510 Atherosclerotic heart disease of native coronary artery without angina pectoris: Secondary | ICD-10-CM

## 2017-12-12 ENCOUNTER — Telehealth: Payer: Self-pay | Admitting: Cardiology

## 2017-12-12 NOTE — Telephone Encounter (Signed)
Returned call to patient Eliquis 5 mg samples left at [facility] office front desk. 

## 2017-12-12 NOTE — Telephone Encounter (Signed)
Left message with RPH advice on home VM - OK per DPR.

## 2017-12-12 NOTE — Telephone Encounter (Signed)
Atorvastatin does not cause frequent urination

## 2017-12-12 NOTE — Telephone Encounter (Signed)
New message   Pt c/o medication issue:  1. Name of Medication: atorvastatin (LIPITOR) 10 MG tablet  2. How are you currently taking this medication (dosage and times per day)? 1 time daily in morning  3. Are you having a reaction (difficulty breathing--STAT)?no  4. What is your medication issue?patient wants to know if this medication causes frequent urination. Please call to discuss.

## 2017-12-12 NOTE — Telephone Encounter (Signed)
New message  Patient calling the office for samples of medication:   1.  What medication and dosage are you requesting samples for? Eliquis  2.  Are you currently out of this medication? no

## 2017-12-12 NOTE — Telephone Encounter (Signed)
Routed to CVRR 

## 2017-12-18 ENCOUNTER — Other Ambulatory Visit: Payer: Self-pay | Admitting: Family Medicine

## 2017-12-18 MED FILL — HYDROCODON-APAP 10-325: 10-325 | 18 days supply | Qty: 75 | Fill #0

## 2017-12-18 NOTE — Telephone Encounter (Signed)
Last OV 11/26/17 Hydrocodone last filled 11/14/17 #75 with 0

## 2018-01-07 ENCOUNTER — Telehealth: Payer: Self-pay | Admitting: Cardiology

## 2018-01-07 NOTE — Telephone Encounter (Signed)
Patient calling the office for samples of medication:   1.  What medication and dosage are you requesting samples for? Eliquis  2.  Are you currently out of this medication? yes    

## 2018-01-07 NOTE — Telephone Encounter (Signed)
Returned pt call,lmom.  Samples of Eliquis 5mg  (2 boxes) will be left at the front desk for him to pick up.

## 2018-01-15 ENCOUNTER — Other Ambulatory Visit: Payer: Self-pay | Admitting: Physician Assistant

## 2018-01-15 MED FILL — HYDROCODON-APAP 10-325: 10-325 | 18 days supply | Qty: 75 | Fill #0

## 2018-01-15 MED FILL — SILDENAFIL CITRATE 20 MG TA: 20 | 20 days supply | Qty: 10 | Fill #0

## 2018-01-15 NOTE — Telephone Encounter (Signed)
Database reviewed 01/15/18. No red flags. UDS to be obtained at next 12-month follow-up. Refill sent.

## 2018-01-15 NOTE — Telephone Encounter (Signed)
Indication for chronic opioid: Ankylosing Spondylitis, OA of knees bilaterally; OA of multiple sites.  Medication and dose: Norco 10-325 mg # pills per month: 75 Last UDS date: 03/01/17 Opioid Treatment Agreement signed (Y/N): Y Opioid Treatment Agreement last reviewed with patient:  Yes [place] reviewed this encounter (include red flags):   07/22/17  LOV: 11/26/17  Please advise

## 2018-01-23 ENCOUNTER — Telehealth: Payer: Self-pay | Admitting: Cardiology

## 2018-01-23 NOTE — Telephone Encounter (Signed)
Advised patient no samples available at this time. Advised he could check back next week.  Patient verbalized understanding.

## 2018-01-23 NOTE — Telephone Encounter (Signed)
New Message          Patient calling the office for samples of medication:   1.  What medication and dosage are you requesting samples for?Eliquis 5 mg  2.  Are you currently out of this medication? No, enough to get him thru the weekend

## 2018-01-23 NOTE — Telephone Encounter (Signed)
Left message to call back  

## 2018-01-24 ENCOUNTER — Ambulatory Visit (INDEPENDENT_AMBULATORY_CARE_PROVIDER_SITE_OTHER): Payer: Medicare HMO

## 2018-01-24 DIAGNOSIS — M1711 Unilateral primary osteoarthritis, right knee: Secondary | ICD-10-CM | POA: Diagnosis not present

## 2018-01-24 DIAGNOSIS — M1712 Unilateral primary osteoarthritis, left knee: Secondary | ICD-10-CM | POA: Diagnosis not present

## 2018-01-24 DIAGNOSIS — M159 Polyosteoarthritis, unspecified: Secondary | ICD-10-CM

## 2018-01-27 ENCOUNTER — Other Ambulatory Visit: Payer: Self-pay | Admitting: Physician Assistant

## 2018-01-27 DIAGNOSIS — M170 Bilateral primary osteoarthritis of knee: Secondary | ICD-10-CM

## 2018-01-30 ENCOUNTER — Telehealth: Payer: Self-pay | Admitting: Cardiology

## 2018-01-30 NOTE — Telephone Encounter (Signed)
New Message    Patient calling the office for samples of medication:   1.  What medication and dosage are you requesting samples for? Eliquis 5mg   2.  Are you currently out of this medication? Just enough until tomorrow

## 2018-01-30 NOTE — Telephone Encounter (Signed)
Made patient aware no samples available.   Advised to call [phone] to see if he qualifies for patient assistance as he has consistently requested samples.   He has enough medication to last until Saturday.     Advised would reach out to [facility] location to see if any samples available.

## 2018-02-03 ENCOUNTER — Encounter (INDEPENDENT_AMBULATORY_CARE_PROVIDER_SITE_OTHER): Payer: Self-pay | Admitting: Family Medicine

## 2018-02-03 ENCOUNTER — Ambulatory Visit (INDEPENDENT_AMBULATORY_CARE_PROVIDER_SITE_OTHER): Payer: Medicare HMO | Admitting: Family Medicine

## 2018-02-03 ENCOUNTER — Other Ambulatory Visit: Payer: Self-pay | Admitting: Family Medicine

## 2018-02-03 DIAGNOSIS — G8929 Other chronic pain: Secondary | ICD-10-CM

## 2018-02-03 DIAGNOSIS — S0540XA Penetrating wound of orbit with or without foreign body, unspecified eye, initial encounter: Secondary | ICD-10-CM

## 2018-02-03 DIAGNOSIS — M1711 Unilateral primary osteoarthritis, right knee: Secondary | ICD-10-CM

## 2018-02-03 DIAGNOSIS — M25561 Pain in right knee: Secondary | ICD-10-CM | POA: Diagnosis not present

## 2018-02-03 DIAGNOSIS — M1712 Unilateral primary osteoarthritis, left knee: Secondary | ICD-10-CM | POA: Diagnosis not present

## 2018-02-03 MED ORDER — APIXABAN 5 MG PO TABS: 5.0000 mg | ORAL_TABLET | Freq: Two times a day (BID) | ORAL | 3 refills | Status: DC

## 2018-02-03 MED FILL — ELIQUIS 5 MG TABLET: 5 | 7 days supply | Qty: 14 | Fill #0

## 2018-02-03 NOTE — Telephone Encounter (Signed)
Returned call to patient. Notified him that no samples of eliquis are available at this time. Rx sent to pharmacy. Patient assistance application sent to patient via MyChart with instructions on how to re-set password.

## 2018-02-03 NOTE — Telephone Encounter (Signed)
Follow Up:      Pt calling to see if we have any samples of Eliquis please.

## 2018-02-03 NOTE — Progress Notes (Signed)
Office Visit Note   Patient: [name] Woods           Date of Birth: Aug 08, 1940           MRN: [id number] Visit Date: 02/03/2018 Requested by: Brunetta Jeans, PA-C [address] [place], [place] [address] PCP: Brunetta Jeans, PA-C  Subjective: Chief Complaint  Patient presents with  . bilateral knee pain/OA, pain worse in right    HPI: He is a 78 year old with bilateral knee pain.  Longstanding problems with his knees.  He was told many years ago he would need knee replacement but he has wanted to avoid surgery as long as possible.  In the past he had Baker's cyst removal from the right knee.  It recurred and he had a second procedure along with arthroscopic debridement.  At that time he was told that the nerves in the back of his knee were getting compressed.  He feels like the posterior pain has returned and he has medial and lateral joint line pain in both knees chronically.  No significant locking or giving way.  He is treated with hydrocodone which no longer seems to be helping.  He is still reluctant to consider surgery.               ROS: He has hypertension, paroxysmal atrial fibrillation, coronary artery disease, sleep apnea and emphysema.  He has regarding, ankylosing spondylitis, gout.  Other systems were reviewed and are negative.  Objective: Vital Signs: There were no vitals taken for this visit.  Physical Exam:  Knees: Bilateral flexion contractures of about 5 degrees.  Trace effusion in both knees with no warmth or erythema.  Both knees are tender along the medial and lateral joint lines.  Right knee has firmness in the popliteal fossa.  Imaging: None today.  Previous x-rays reviewed showing end-stage osteoarthritis in both knees, right greater than left, with loose bodies in the right and probable loose bodies in the posterior right knee joint.  Assessment & Plan: 1.  Bilateral end-stage knee osteoarthritis with loose bodies in the right knee, particularly  in the posterior joint, possibly compressing the neurovascular structures. -Temporary pain relief with cortisone injection.  MRI of the right knee to assess the neurovascular structures in the posterior knee.  If there is sign of compromise, he will be willing to consider knee replacement.  Otherwise we will try Visco supplementation in the future if short-term relief from cortisone.   Follow-Up Instructions: No follow-ups on file.      Procedures: Bilateral knee steroid injections: After sterile prep with Betadine, injected 3 cc 1% lidocaine without epinephrine and 40 mg methylprednisolone from superolateral approach into each knee, a flash of synovial fluid was obtained prior to each injection.     PMFS History: Patient Active Problem List   Diagnosis Date Noted  . Encounter for chronic pain management 03/01/2017  . Abnormal stress test 05/10/2016  . Elevated coronary artery calcium score 05/10/2016  . Hyperglycemia 04/23/2016  . Anxiety and depression 09/12/2015  . History of colonic polyps 03/02/2015  . Chronic anticoagulation 03/02/2015  . Multiple lung nodules 10/22/2014  . Lung nodule, multiple 08/24/2014  . Emphysema of lung ([place]) 07/28/2014  . Gout 06/30/2014  . Paroxysmal atrial fibrillation ([place]) 03/21/2012  . CAD (coronary artery disease) 03/21/2012  . GERD (gastroesophageal reflux disease) 03/21/2012  . COLONIC POLYPS, ADENOMATOUS 04/25/2007  . Essential hypertension 04/25/2007  . VENTRICULAR HYPERTROPHY, LEFT 04/25/2007  . HIATAL HERNIA 04/25/2007  .  ARTHRITIS 04/25/2007  . SLEEP APNEA 04/25/2007  . CHOLELITHIASIS, HX OF 04/25/2007  . NEPHROLITHIASIS, HX OF 04/25/2007  . Ankylosing spondylitis ([place]) 09/04/2006   Past Medical History:  Diagnosis Date  . Ankylosing spondylitis ([place])   . Arthritis   . CAD (coronary artery disease) 7517,0017   30% mid LAD lesion on cardiac cath  . Chicken pox   . Cholelithiasis   . GERD (gastroesophageal reflux disease)     . [place] measles   . Hiatal hernia   . Hypertension   . Nephrolithiasis   . Persistent atrial fibrillation   . Sleep apnea    No CPAP  . 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 Ventricular hypertrophy     Family History  Problem Relation Age of Onset  . Heart disease Father 77       Deceased  . Heart attack Father 82  . Heart disease Brother 20       "Died in his sleep"  . Arthritis/Rheumatoid Mother        Deceased-86  . Hyperlipidemia Brother   . Stroke Brother 18       Deceased  . Other Sister        Estate agent in [place]  . Other Sister        MVA  . Other Brother        Carjacked-killed  . Dementia Paternal Grandmother   . Cancer Paternal Uncle   . Healthy Brother   . Healthy Son        X3  . Healthy Daughter        x1  . Colon cancer Neg Hx     Past Surgical History:  Procedure Laterality Date  . CARDIAC CATHETERIZATION  03/2012   30% mid LAD lesion otherwise normal cors, LVEF 65%  . CHOLECYSTECTOMY    . ELBOW ARTHROPLASTY     Left  . LEFT AND RIGHT HEART CATHETERIZATION WITH CORONARY ANGIOGRAM N/A 03/24/2012   Procedure: LEFT AND RIGHT HEART CATHETERIZATION WITH CORONARY ANGIOGRAM;  Surgeon: Sherren Mocha, MD;  Location[facility] CATH LAB;  Service: Cardiovascular;  Laterality: N/A;  . LEG FLUID REMOVAL RIGHT    . TONSILLECTOMY     Social History   Occupational History  . 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 Smoking status: Never Smoker  . Smokeless tobacco: Never Used  Substance and Sexual Activity  . Alcohol use: No  . Drug use: No  . Sexual activity: Not on file

## 2018-02-05 ENCOUNTER — Other Ambulatory Visit: Payer: Self-pay | Admitting: Cardiology

## 2018-02-05 NOTE — Telephone Encounter (Signed)
Pt assistance form for Eliquis mailed to pt to fill out and return

## 2018-02-06 ENCOUNTER — Other Ambulatory Visit: Payer: Self-pay

## 2018-02-06 ENCOUNTER — Encounter: Payer: Self-pay | Admitting: Physician Assistant

## 2018-02-06 ENCOUNTER — Ambulatory Visit (INDEPENDENT_AMBULATORY_CARE_PROVIDER_SITE_OTHER): Payer: Medicare HMO | Admitting: Physician Assistant

## 2018-02-06 VITALS — BP 140/70 | HR 77 | Temp 97.8°F | Resp 14 | Ht 72.0 in | Wt 220.0 lb

## 2018-02-06 DIAGNOSIS — B9689 Other specified bacterial agents as the cause of diseases classified elsewhere: Secondary | ICD-10-CM | POA: Diagnosis not present

## 2018-02-06 DIAGNOSIS — E785 Hyperlipidemia, unspecified: Secondary | ICD-10-CM | POA: Diagnosis not present

## 2018-02-06 DIAGNOSIS — J019 Acute sinusitis, unspecified: Secondary | ICD-10-CM | POA: Diagnosis not present

## 2018-02-06 MED ORDER — PRAVASTATIN SODIUM 20 MG PO TABS: 20.0000 mg | ORAL_TABLET | Freq: Every day | ORAL | 3 refills | Status: DC

## 2018-02-06 MED ORDER — AMOXICILLIN-POT CLAVULANATE 875-125 MG PO TABS: 1.0000 | ORAL_TABLET | Freq: Two times a day (BID) | ORAL | 0 refills | Status: DC

## 2018-02-06 MED ORDER — BENZONATATE 100 MG PO CAPS: 100.0000 mg | ORAL_CAPSULE | Freq: Three times a day (TID) | ORAL | 0 refills | Status: DC | PRN

## 2018-02-06 MED FILL — AMOX-CLAV 875-125 MG TABLET: 875-125 | 7 days supply | Qty: 14 | Fill #0

## 2018-02-06 MED FILL — BENZONATATE 100 MG CAPS: 100 | 10 days supply | Qty: 30 | Fill #0

## 2018-02-06 NOTE — Progress Notes (Signed)
Patient presents to clinic today c/o 2 weeks of nasal congestion, pnd, hacking cough, sinus pain and headaches. Denies fever, chills, chest pain or SOB. Some sweating noted occasionally. Denies ear pain. Denies recent travel or sick contact. Has taken cough drops but nothing else OTC.  Past Medical History:  Diagnosis Date  . Ankylosing spondylitis ([place])   . Arthritis   . CAD (coronary artery disease) 2122,4825   30% mid LAD lesion on cardiac cath  . Chicken pox   . Cholelithiasis   . GERD (gastroesophageal reflux disease)   . [place] measles   . Hiatal hernia   . Hypertension   . Nephrolithiasis   . Persistent atrial fibrillation   . Sleep apnea    No CPAP  . 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 Ventricular hypertrophy     Current Outpatient Medications on File Prior to Visit  Medication Sig Dispense Refill  . allopurinol (ZYLOPRIM) 100 MG tablet TAKE 1 TABLET EVERY DAY 90 tablet 1  . apixaban (ELIQUIS) 5 MG TABS tablet Take 1 tablet (5 mg total) by mouth 2 (two) times daily. 60 tablet 3  . cyclobenzaprine (FLEXERIL) 10 MG tablet Take 1 tablet (10 mg total) by mouth 3 (three) times daily as needed for muscle spasms. 15 tablet 0  . diltiazem (CARDIZEM CD) 240 MG 24 hr capsule TAKE 1 CAPSULE EVERY DAY (DUE FOR APPOINTMENT FOR FUTURE REFILLS) 90 capsule 1  . escitalopram (LEXAPRO) 10 MG tablet Take 1 tablet (10 mg total) by mouth daily. 30 tablet 1  . furosemide (LASIX) 20 MG tablet TAKE 1 TABLET EVERY DAY 90 tablet 0  . HYDROcodone-acetaminophen (NORCO) 10-325 MG tablet TAKE 1 TABLET BY MOUTH EVERY 6 HOURS AS NEEDED 75 tablet 0  . KLOR-CON M20 20 MEQ tablet TAKE 1 TABLET EVERY DAY 90 tablet 1  . Loperamide HCl (IMODIUM PO) Take by mouth as needed. Reported on 05/10/2015    . metoprolol succinate (TOPROL-XL) 100 MG 24 hr tablet Take 1 tablet (100 mg total) by mouth 2 (two) times daily. Take with or immediately following a meal. 180 tablet 1  . omeprazole (PRILOSEC) 40 MG capsule Take  1 capsule (40 mg total) by mouth daily. 30 capsule 5  . Potassium Chloride ER 20 MEQ TBCR Take 20 mEq by mouth daily. 90 tablet 1  . sildenafil (REVATIO) 20 MG tablet TAKE 1 TABLET BY MOUTH AS NEEDED FOR ERECTILE DYSFUNCTION. DO NO TAKE MORE THAN 1 DOSE IN 36 HOURS. 30 tablet 0  . atorvastatin (LIPITOR) 10 MG tablet TAKE 1 TABLET (10 MG TOTAL) BY MOUTH DAILY. (Patient not taking: Reported on 02/06/2018) 90 tablet 0   No current facility-administered medications on file prior to visit.     Allergies  Allergen Reactions  . Coumadin [Warfarin] Other (See Comments)    GI bleeds    Family History  Problem Relation Age of Onset  . Heart disease Father 82       Deceased  . Heart attack Father 86  . Heart disease Brother 34       "Died in his sleep"  . Arthritis/Rheumatoid Mother        Deceased-86  . Hyperlipidemia Brother   . Stroke Brother 40       Deceased  . Other Sister        Estate agent in [place]  . Other Sister        MVA  . Other Brother        Carjacked-killed  .  Dementia Paternal Grandmother   . Cancer Paternal Uncle   . Healthy Brother   . Healthy Son        X3  . Healthy Daughter        x1  . Colon cancer Neg Hx     Social History   Socioeconomic History  . Marital status: Married    Spouse name: Not on file  . Number of children: 6  . Years of education: Not on file  . Highest education level: Not on file  Occupational History  . 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 Financial resource strain: Not on file  . Food insecurity:    Worry: Not on file    Inability: Not on file  . Transportation needs:    Medical: Not on file    Non-medical: Not on file  Tobacco Use  . Smoking status: Never Smoker  . Smokeless tobacco: Never Used  Substance and Sexual Activity  . Alcohol use: No  . Drug use: No  . Sexual activity: Not on file  Lifestyle  . Physical activity:    Days per week: Not on file    Minutes per  session: Not on file  . Stress: Not on file  Relationships  . Social connections:    Talks on phone: Not on file    Gets together: Not on file    Attends religious service: Not on file    Active member of club or organization: Not on file    Attends meetings of clubs or organizations: Not on file    Relationship status: Not on file  Other Topics Concern  . Not on file  Social History Narrative   4 caffeine drinks daily    Review of Systems - See HPI.  All other ROS are negative.  BP 140/70   Pulse 77   Temp 97.8 F (36.6 C) (Oral)   Resp 14   Ht 6' (1.829 m)   Wt 220 lb (99.8 kg)   SpO2 98%   BMI 29.84 kg/m   Physical Exam Vitals signs reviewed.  Constitutional:      Appearance: Normal appearance.  HENT:     Head: Normocephalic and atraumatic.     Right Ear: Tympanic membrane normal.     Left Ear: Tympanic membrane normal.     Nose: Congestion and rhinorrhea present.     Right Turbinates: Enlarged and swollen.     Left Turbinates: Enlarged and swollen.     Mouth/Throat:     Mouth: Mucous membranes are moist.  Eyes:     Conjunctiva/sclera: Conjunctivae normal.  Neck:     Musculoskeletal: Neck supple. No muscular tenderness.  Cardiovascular:     Rate and Rhythm: Normal rate and regular rhythm.     Pulses: Normal pulses.     Heart sounds: Normal heart sounds.  Pulmonary:     Effort: Pulmonary effort is normal.  Neurological:     Mental Status: He is alert.    Assessment/Plan: 1. Acute bacterial sinusitis Rx Augmentin.  Increase fluids.  Rest.  Saline nasal spray.  Probiotic.  Mucinex as directed.  Humidifier in bedroom. Tessalon per orders.  Call or return to clinic if symptoms are not improving.   2. Hyperlipidemia, unspecified hyperlipidemia type Noted at end of visit. Nocturia with Atorvastatin, resolved with stopping medication. Will start 20 mg Pravachol. Will recheck lipids and LFT at follow-up in 4 weeks.  Leeanne Rio, PA-C

## 2018-02-06 NOTE — Patient Instructions (Signed)
Please take antibiotic as directed.  Increase fluid intake.  Use Saline nasal spray.  Take a daily multivitamin. Tessalon as directed for cough.  Place a humidifier in the bedroom.  Please call or return clinic if symptoms are not improving.  Sinusitis Sinusitis is redness, soreness, and swelling (inflammation) of the paranasal sinuses. Paranasal sinuses are air pockets within the bones of your face (beneath the eyes, the middle of the forehead, or above the eyes). In healthy paranasal sinuses, mucus is able to drain out, and air is able to circulate through them by way of your nose. However, when your paranasal sinuses are inflamed, mucus and air can become trapped. This can allow bacteria and other germs to grow and cause infection. Sinusitis can develop quickly and last only a short time (acute) or continue over a long period (chronic). Sinusitis that lasts for more than 12 weeks is considered chronic.  CAUSES  Causes of sinusitis include:  Allergies.  Structural abnormalities, such as displacement of the cartilage that separates your nostrils (deviated septum), which can decrease the air flow through your nose and sinuses and affect sinus drainage.  Functional abnormalities, such as when the small hairs (cilia) that line your sinuses and help remove mucus do not work properly or are not present. SYMPTOMS  Symptoms of acute and chronic sinusitis are the same. The primary symptoms are pain and pressure around the affected sinuses. Other symptoms include:  Upper toothache.  Earache.  Headache.  Bad breath.  Decreased sense of smell and taste.  A cough, which worsens when you are lying flat.  Fatigue.  Fever.  Thick drainage from your nose, which often is green and may contain pus (purulent).  Swelling and warmth over the affected sinuses. DIAGNOSIS  Your caregiver will perform a physical exam. During the exam, your caregiver may:  Look in your nose for signs of abnormal growths  in your nostrils (nasal polyps).  Tap over the affected sinus to check for signs of infection.  View the inside of your sinuses (endoscopy) with a special imaging device with a light attached (endoscope), which is inserted into your sinuses. If your caregiver suspects that you have chronic sinusitis, one or more of the following tests may be recommended:  Allergy tests.  Nasal culture A sample of mucus is taken from your nose and sent to a lab and screened for bacteria.  Nasal cytology A sample of mucus is taken from your nose and examined by your caregiver to determine if your sinusitis is related to an allergy. TREATMENT  Most cases of acute sinusitis are related to a viral infection and will resolve on their own within 10 days. Sometimes medicines are prescribed to help relieve symptoms (pain medicine, decongestants, nasal steroid sprays, or saline sprays).  However, for sinusitis related to a bacterial infection, your caregiver will prescribe antibiotic medicines. These are medicines that will help kill the bacteria causing the infection.  Rarely, sinusitis is caused by a fungal infection. In theses cases, your caregiver will prescribe antifungal medicine. For some cases of chronic sinusitis, surgery is needed. Generally, these are cases in which sinusitis recurs more than 3 times per year, despite other treatments. HOME CARE INSTRUCTIONS   Drink plenty of water. Water helps thin the mucus so your sinuses can drain more easily.  Use a humidifier.  Inhale steam 3 to 4 times a day (for example, sit in the bathroom with the shower running).  Apply a warm, moist washcloth to your face 3  to 4 times a day, or as directed by your caregiver.  Use saline nasal sprays to help moisten and clean your sinuses.  Take over-the-counter or prescription medicines for pain, discomfort, or fever only as directed by your caregiver. SEEK IMMEDIATE MEDICAL CARE IF:  You have increasing pain or severe  headaches.  You have nausea, vomiting, or drowsiness.  You have swelling around your face.  You have vision problems.  You have a stiff neck.  You have difficulty breathing. MAKE SURE YOU:   Understand these instructions.  Will watch your condition.  Will get help right away if you are not doing well or get worse. Document Released: 01/15/2005 Document Revised: 04/09/2011 Document Reviewed: 01/30/2011 [facility] Patient Information 2014 [place], [place].

## 2018-02-10 ENCOUNTER — Other Ambulatory Visit: Payer: Self-pay | Admitting: Physician Assistant

## 2018-02-12 ENCOUNTER — Ambulatory Visit
Admission: RE | Admit: 2018-02-12 | Discharge: 2018-02-12 | Disposition: A | Discharge status: Home / Self Care | Payer: Medicare HMO | Source: Ambulatory Visit | Attending: Family Medicine | Admitting: Family Medicine

## 2018-02-12 ENCOUNTER — Telehealth: Payer: Self-pay | Admitting: Cardiology

## 2018-02-12 DIAGNOSIS — M25561 Pain in right knee: Principal | ICD-10-CM

## 2018-02-12 DIAGNOSIS — S0540XA Penetrating wound of orbit with or without foreign body, unspecified eye, initial encounter: Secondary | ICD-10-CM

## 2018-02-12 DIAGNOSIS — G8929 Other chronic pain: Secondary | ICD-10-CM

## 2018-02-12 DIAGNOSIS — Z135 Encounter for screening for eye and ear disorders: Secondary | ICD-10-CM | POA: Diagnosis not present

## 2018-02-12 DIAGNOSIS — Z01818 Encounter for other preprocedural examination: Secondary | ICD-10-CM | POA: Diagnosis not present

## 2018-02-12 NOTE — Telephone Encounter (Signed)
Called patient, LVM, advised that samples were here and up front for pick up.   Medication given: Eliquis 5 mg Lot #: [id number] Expiration: 06/2020 Qty: 2 boxes.

## 2018-02-12 NOTE — Telephone Encounter (Signed)
New Message   Patient calling the office for samples of medication:   1.  What medication and dosage are you requesting samples for? Eliquis 5mg    2.  Are you currently out of this medication? 1 more

## 2018-02-13 ENCOUNTER — Telehealth (INDEPENDENT_AMBULATORY_CARE_PROVIDER_SITE_OTHER): Payer: Self-pay | Admitting: Family Medicine

## 2018-02-13 NOTE — Telephone Encounter (Signed)
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

## 2018-02-13 NOTE — Telephone Encounter (Signed)
Left message on patient's mobile voice mail informing him that a MyChart message has been sent to him.  He may call us back with any questions.  I will hold this message for now - may check the chart again tomorrow to see if he has read the message.

## 2018-02-14 ENCOUNTER — Other Ambulatory Visit: Payer: Self-pay | Admitting: Physician Assistant

## 2018-02-14 MED FILL — HYDROCODON-APAP 10-325: 10-325 | 18 days supply | Qty: 75 | Fill #0

## 2018-02-14 NOTE — Telephone Encounter (Signed)
Last refill: 01/15/18 #75, 0 Last OV: 11/26/17 - pain management

## 2018-02-17 ENCOUNTER — Encounter (INDEPENDENT_AMBULATORY_CARE_PROVIDER_SITE_OTHER): Payer: Self-pay

## 2018-02-17 NOTE — Telephone Encounter (Signed)
Left message on patient's mobile voice mail to call back - will go over MRI results with him.

## 2018-02-17 NOTE — Telephone Encounter (Signed)
I scheduled him for 02/21/2018 at 10:45 am with Dr. Marlou Sa - I sent this information in a MyChart message, as I cannot reach him by phone (keep getting his voice mail).  Instructed him to call our office to reschedule this if it is not a convenient time/date for him.

## 2018-02-21 ENCOUNTER — Telehealth: Payer: Self-pay | Admitting: Cardiology

## 2018-02-21 ENCOUNTER — Ambulatory Visit (INDEPENDENT_AMBULATORY_CARE_PROVIDER_SITE_OTHER): Payer: Self-pay | Admitting: Orthopedic Surgery

## 2018-02-21 NOTE — Telephone Encounter (Signed)
Patient calling the office for samples of medication:   1.  What medication and dosage are you requesting samples for?   Eliquis 5 mg  2.  Are you currently out of this medication? No, has enough for the weekend.

## 2018-02-21 NOTE — Telephone Encounter (Signed)
Samples given, placed up front.  Patient notified.

## 2018-02-24 ENCOUNTER — Ambulatory Visit (INDEPENDENT_AMBULATORY_CARE_PROVIDER_SITE_OTHER): Payer: Medicare HMO | Admitting: Physician Assistant

## 2018-02-24 ENCOUNTER — Other Ambulatory Visit: Payer: Self-pay

## 2018-02-24 ENCOUNTER — Encounter: Payer: Self-pay | Admitting: Physician Assistant

## 2018-02-24 VITALS — BP 118/78 | HR 56 | Temp 98.1°F | Resp 16 | Ht 72.0 in | Wt 219.0 lb

## 2018-02-24 DIAGNOSIS — B9689 Other specified bacterial agents as the cause of diseases classified elsewhere: Secondary | ICD-10-CM

## 2018-02-24 DIAGNOSIS — J019 Acute sinusitis, unspecified: Secondary | ICD-10-CM | POA: Diagnosis not present

## 2018-02-24 MED ORDER — DOXYCYCLINE HYCLATE 100 MG PO CAPS: 100.0000 mg | ORAL_CAPSULE | Freq: Two times a day (BID) | ORAL | 0 refills | Status: DC

## 2018-02-24 MED FILL — DOXYCYCLINE HYC 100 MG CAPS: 100 | 10 days supply | Qty: 20 | Fill #0

## 2018-02-24 NOTE — Patient Instructions (Signed)
Please take antibiotic as directed.  Increase fluid intake.  Use Saline nasal spray.  Take a daily multivitamin. Coricidin HBP for cough OTC. Can continue the Tessalon perles.  Place a humidifier in the bedroom.  Please call or return clinic if symptoms are not improving.  Sinusitis Sinusitis is redness, soreness, and swelling (inflammation) of the paranasal sinuses. Paranasal sinuses are air pockets within the bones of your face (beneath the eyes, the middle of the forehead, or above the eyes). In healthy paranasal sinuses, mucus is able to drain out, and air is able to circulate through them by way of your nose. However, when your paranasal sinuses are inflamed, mucus and air can become trapped. This can allow bacteria and other germs to grow and cause infection. Sinusitis can develop quickly and last only a short time (acute) or continue over a long period (chronic). Sinusitis that lasts for more than 12 weeks is considered chronic.  CAUSES  Causes of sinusitis include:  Allergies.  Structural abnormalities, such as displacement of the cartilage that separates your nostrils (deviated septum), which can decrease the air flow through your nose and sinuses and affect sinus drainage.  Functional abnormalities, such as when the small hairs (cilia) that line your sinuses and help remove mucus do not work properly or are not present. SYMPTOMS  Symptoms of acute and chronic sinusitis are the same. The primary symptoms are pain and pressure around the affected sinuses. Other symptoms include:  Upper toothache.  Earache.  Headache.  Bad breath.  Decreased sense of smell and taste.  A cough, which worsens when you are lying flat.  Fatigue.  Fever.  Thick drainage from your nose, which often is green and may contain pus (purulent).  Swelling and warmth over the affected sinuses. DIAGNOSIS  Your caregiver will perform a physical exam. During the exam, your caregiver may:  Look in your  nose for signs of abnormal growths in your nostrils (nasal polyps).  Tap over the affected sinus to check for signs of infection.  View the inside of your sinuses (endoscopy) with a special imaging device with a light attached (endoscope), which is inserted into your sinuses. If your caregiver suspects that you have chronic sinusitis, one or more of the following tests may be recommended:  Allergy tests.  Nasal culture A sample of mucus is taken from your nose and sent to a lab and screened for bacteria.  Nasal cytology A sample of mucus is taken from your nose and examined by your caregiver to determine if your sinusitis is related to an allergy. TREATMENT  Most cases of acute sinusitis are related to a viral infection and will resolve on their own within 10 days. Sometimes medicines are prescribed to help relieve symptoms (pain medicine, decongestants, nasal steroid sprays, or saline sprays).  However, for sinusitis related to a bacterial infection, your caregiver will prescribe antibiotic medicines. These are medicines that will help kill the bacteria causing the infection.  Rarely, sinusitis is caused by a fungal infection. In theses cases, your caregiver will prescribe antifungal medicine. For some cases of chronic sinusitis, surgery is needed. Generally, these are cases in which sinusitis recurs more than 3 times per year, despite other treatments. HOME CARE INSTRUCTIONS   Drink plenty of water. Water helps thin the mucus so your sinuses can drain more easily.  Use a humidifier.  Inhale steam 3 to 4 times a day (for example, sit in the bathroom with the shower running).  Apply a warm, moist  washcloth to your face 3 to 4 times a day, or as directed by your caregiver.  Use saline nasal sprays to help moisten and clean your sinuses.  Take over-the-counter or prescription medicines for pain, discomfort, or fever only as directed by your caregiver. SEEK IMMEDIATE MEDICAL CARE  IF:  You have increasing pain or severe headaches.  You have nausea, vomiting, or drowsiness.  You have swelling around your face.  You have vision problems.  You have a stiff neck.  You have difficulty breathing. MAKE SURE YOU:   Understand these instructions.  Will watch your condition.  Will get help right away if you are not doing well or get worse. Document Released: 01/15/2005 Document Revised: 04/09/2011 Document Reviewed: 01/30/2011 [facility] Patient Information 2014 [place], [place].

## 2018-02-24 NOTE — Progress Notes (Signed)
Patient presents to clinic today c/o recurrence of sinus pressure, sinus pain, ear pressure, cough and fatigue s/p treatment for sinusitis. Was diagnosed on 02/06/18 by this provider and started on 7-day course of Augmentin. Endorses taking as directed and completing entire course of medication. Notes doing very well then but symptoms recurred after completion of medication.   Past Medical History:  Diagnosis Date  . Ankylosing spondylitis ([place])   . Arthritis   . CAD (coronary artery disease) 7893,8101   30% mid LAD lesion on cardiac cath  . Chicken pox   . Cholelithiasis   . GERD (gastroesophageal reflux disease)   . [place] measles   . Hiatal hernia   . Hypertension   . Nephrolithiasis   . Persistent atrial fibrillation   . Sleep apnea    No CPAP  . 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 Ventricular hypertrophy     Current Outpatient Medications on File Prior to Visit  Medication Sig Dispense Refill  . allopurinol (ZYLOPRIM) 100 MG tablet TAKE 1 TABLET EVERY DAY 90 tablet 1  . apixaban (ELIQUIS) 5 MG TABS tablet Take 1 tablet (5 mg total) by mouth 2 (two) times daily. 60 tablet 3  . atorvastatin (LIPITOR) 10 MG tablet TAKE 1 TABLET (10 MG TOTAL) BY MOUTH DAILY. 90 tablet 0  . cyclobenzaprine (FLEXERIL) 10 MG tablet Take 1 tablet (10 mg total) by mouth 3 (three) times daily as needed for muscle spasms. 15 tablet 0  . diltiazem (CARDIZEM CD) 240 MG 24 hr capsule TAKE 1 CAPSULE EVERY DAY (DUE FOR APPOINTMENT FOR FUTURE REFILLS) 90 capsule 1  . escitalopram (LEXAPRO) 10 MG tablet Take 1 tablet (10 mg total) by mouth daily. 30 tablet 1  . furosemide (LASIX) 20 MG tablet TAKE 1 TABLET EVERY DAY 90 tablet 0  . HYDROcodone-acetaminophen (NORCO) 10-325 MG tablet TAKE 1 TABLET BY MOUTH EVERY 6 HOURS AS NEEDED 75 tablet 0  . KLOR-CON M20 20 MEQ tablet TAKE 1 TABLET EVERY DAY 90 tablet 1  . Loperamide HCl (IMODIUM PO) Take by mouth as needed. Reported on 05/10/2015    . metoprolol succinate  (TOPROL-XL) 100 MG 24 hr tablet TAKE 1 TABLET  TWO TIMES DAILY  WITH OR IMMEDIATELY FOLLOWING A MEAL. 180 tablet 1  . omeprazole (PRILOSEC) 40 MG capsule Take 1 capsule (40 mg total) by mouth daily. 30 capsule 5  . Potassium Chloride ER 20 MEQ TBCR Take 20 mEq by mouth daily. 90 tablet 1  . pravastatin (PRAVACHOL) 20 MG tablet Take 1 tablet (20 mg total) by mouth daily. 90 tablet 3  . sildenafil (REVATIO) 20 MG tablet TAKE 1 TABLET BY MOUTH AS NEEDED FOR ERECTILE DYSFUNCTION. DO NO TAKE MORE THAN 1 DOSE IN 36 HOURS. 30 tablet 0   No current facility-administered medications on file prior to visit.     Allergies  Allergen Reactions  . Coumadin [Warfarin] Other (See Comments)    GI bleeds    Family History  Problem Relation Age of Onset  . Heart disease Father 80       Deceased  . Heart attack Father 31  . Heart disease Brother 39       "Died in his sleep"  . Arthritis/Rheumatoid Mother        Deceased-86  . Hyperlipidemia Brother   . Stroke Brother 42       Deceased  . Other Sister        Estate agent in [place]  . Other Sister  MVA  . Other Brother        Carjacked-killed  . Dementia Paternal Grandmother   . Cancer Paternal Uncle   . Healthy Brother   . Healthy Son        X3  . Healthy Daughter        x1  . Colon cancer Neg Hx     Social History   Socioeconomic History  . Marital status: Married    Spouse name: Not on file  . Number of children: 6  . Years of education: Not on file  . Highest education level: Not on file  Occupational History  . 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 Financial resource strain: Not on file  . Food insecurity:    Worry: Not on file    Inability: Not on file  . Transportation needs:    Medical: Not on file    Non-medical: Not on file  Tobacco Use  . Smoking status: Never Smoker  . Smokeless tobacco: Never Used  Substance and Sexual Activity  . Alcohol use: No  . Drug use: No    . Sexual activity: Not on file  Lifestyle  . Physical activity:    Days per week: Not on file    Minutes per session: Not on file  . Stress: Not on file  Relationships  . Social connections:    Talks on phone: Not on file    Gets together: Not on file    Attends religious service: Not on file    Active member of club or organization: Not on file    Attends meetings of clubs or organizations: Not on file    Relationship status: Not on file  Other Topics Concern  . Not on file  Social History Narrative   4 caffeine drinks daily    Review of Systems - See HPI.  All other ROS are negative.  BP 118/78   Pulse (!) 56   Temp 98.1 F (36.7 C) (Oral)   Resp 16   Ht 6' (1.829 m)   Wt 219 lb (99.3 kg)   SpO2 97%   BMI 29.70 kg/m   Physical Exam Vitals signs reviewed.  Constitutional:      Appearance: Normal appearance.  HENT:     Head: Normocephalic and atraumatic.     Right Ear: Tympanic membrane normal.     Left Ear: Tympanic membrane normal.     Nose: Congestion present.     Right Sinus: Frontal sinus tenderness present.     Left Sinus: Frontal sinus tenderness present.     Mouth/Throat:     Mouth: Mucous membranes are moist.  Eyes:     Conjunctiva/sclera: Conjunctivae normal.  Neck:     Musculoskeletal: Neck supple.  Cardiovascular:     Rate and Rhythm: Normal rate and regular rhythm.     Pulses: Normal pulses.  Pulmonary:     Effort: Pulmonary effort is normal.     Breath sounds: Normal breath sounds.  Neurological:     General: No focal deficit present.     Mental Status: He is alert and oriented to person, place, and time.    Assessment/Plan: 1. Acute bacterial sinusitis Rx Doxycycline. Supportive measures and OTC medications as directed. Follow-up discussed.    Leeanne Rio, PA-C

## 2018-02-27 ENCOUNTER — Ambulatory Visit: Payer: Medicare HMO | Admitting: Physician Assistant

## 2018-03-06 ENCOUNTER — Ambulatory Visit: Payer: Medicare HMO | Admitting: Physician Assistant

## 2018-03-07 ENCOUNTER — Ambulatory Visit (INDEPENDENT_AMBULATORY_CARE_PROVIDER_SITE_OTHER): Payer: Medicare HMO | Admitting: Physician Assistant

## 2018-03-07 ENCOUNTER — Other Ambulatory Visit: Payer: Self-pay

## 2018-03-07 ENCOUNTER — Encounter: Payer: Self-pay | Admitting: Physician Assistant

## 2018-03-07 ENCOUNTER — Telehealth: Payer: Self-pay | Admitting: Cardiology

## 2018-03-07 VITALS — BP 142/70 | HR 59 | Temp 97.5°F | Resp 16 | Ht 72.0 in | Wt 217.0 lb

## 2018-03-07 DIAGNOSIS — G8929 Other chronic pain: Secondary | ICD-10-CM

## 2018-03-07 DIAGNOSIS — M5441 Lumbago with sciatica, right side: Secondary | ICD-10-CM

## 2018-03-07 DIAGNOSIS — Z79899 Other long term (current) drug therapy: Secondary | ICD-10-CM

## 2018-03-07 MED ORDER — METHYLPREDNISOLONE 4 MG PO TBPK: ORAL_TABLET | ORAL | 0 refills | Status: DC

## 2018-03-07 MED FILL — METHYLPREDNISOLONE 4 MG TAB: 4 | 6 days supply | Qty: 21 | Fill #0

## 2018-03-07 MED FILL — SILDENAFIL CITRATE 20 MG TA: 20 | 20 days supply | Qty: 10 | Fill #1

## 2018-03-07 NOTE — Telephone Encounter (Signed)
Patient calling the office for samples of medication:   1.  What medication and dosage are you requesting samples for? Eliquis 5 mg.   2.  Are you currently out of this medication? No, patient has enough for the weekend.

## 2018-03-07 NOTE — Progress Notes (Signed)
Reviewed and updated  Today.  Indication for chronic opioid: Osteoarthritis of Multiple Sites; ES OA of knees bilaterally, Ankylosing Spondylitis Medication and dose: Hydrocodone-APAP 10-325 mg # pills per month: 75 Last UDS date: 03/01/2017 Pain contract signed (date): 06/29/2017 Date narcotic database last reviewed (include red flags): Today.No red flags. Documented in PDMP.  Pain Inventory (1-10 worse): Average Pain 5-6 Pain Right Now 6-7/10 (also having some new back pain -- see below) My pain is aching and severe (character i.e. sharp, stabbing, dull, constant etc)  Pain is worse with: prolonged standing and ambulation Relief from Meds: Fair.  In the last 24 hours, has pain interfered with the following (1-10 greatest interference) ? General activity 7-8/10 Relation with others 0/10 Enjoyment of life 4/10 What TIME of day is your pain at its worst? evening       Sleep (in general) decent per patient.  Mobility/Function: Assistance device: none How many minutes can you walk? 15-20 Ability to climb steps?  present but painful Do you drive? yes  Neuro/Psych Sx: (bladder, bowel, weakness, dizziness, depression etc) None Prior Studies: See EMR Physicians involved in your care: Any changes since last visit?  No change since last visit  Patient also with a few days of lower back pain after an episode of heavy lifting. Is bilateral and sometimes radiates into RLE. Denies numbness, tingling or weakness. Denies saddle anesthesia or change to bowel/bladder habits.   Past Medical History:  Diagnosis Date  . Ankylosing spondylitis ([place])   . Arthritis   . CAD (coronary artery disease) 0277,4128   30% mid LAD lesion on cardiac cath  . Chicken pox   . Cholelithiasis   . GERD (gastroesophageal reflux disease)   . [place] measles   . Hiatal hernia   . Hypertension   . Nephrolithiasis   . Persistent atrial fibrillation   . Sleep apnea    No CPAP  . Tubular adenoma of colon  03/1993  . Ventricular hypertrophy     Current Outpatient Medications on File Prior to Visit  Medication Sig Dispense Refill  . allopurinol (ZYLOPRIM) 100 MG tablet TAKE 1 TABLET EVERY DAY 90 tablet 1  . apixaban (ELIQUIS) 5 MG TABS tablet Take 1 tablet (5 mg total) by mouth 2 (two) times daily. 60 tablet 3  . atorvastatin (LIPITOR) 10 MG tablet TAKE 1 TABLET (10 MG TOTAL) BY MOUTH DAILY. 90 tablet 0  . cyclobenzaprine (FLEXERIL) 10 MG tablet Take 1 tablet (10 mg total) by mouth 3 (three) times daily as needed for muscle spasms. 15 tablet 0  . diltiazem (CARDIZEM CD) 240 MG 24 hr capsule TAKE 1 CAPSULE EVERY DAY (DUE FOR APPOINTMENT FOR FUTURE REFILLS) 90 capsule 1  . escitalopram (LEXAPRO) 10 MG tablet Take 1 tablet (10 mg total) by mouth daily. 30 tablet 1  . furosemide (LASIX) 20 MG tablet TAKE 1 TABLET EVERY DAY 90 tablet 0  . HYDROcodone-acetaminophen (NORCO) 10-325 MG tablet TAKE 1 TABLET BY MOUTH EVERY 6 HOURS AS NEEDED 75 tablet 0  . KLOR-CON M20 20 MEQ tablet TAKE 1 TABLET EVERY DAY 90 tablet 1  . Loperamide HCl (IMODIUM PO) Take by mouth as needed. Reported on 05/10/2015    . metoprolol succinate (TOPROL-XL) 100 MG 24 hr tablet TAKE 1 TABLET  TWO TIMES DAILY  WITH OR IMMEDIATELY FOLLOWING A MEAL. 180 tablet 1  . omeprazole (PRILOSEC) 40 MG capsule Take 1 capsule (40 mg total) by mouth daily. 30 capsule 5  . Potassium Chloride ER  20 MEQ TBCR Take 20 mEq by mouth daily. 90 tablet 1  . pravastatin (PRAVACHOL) 20 MG tablet Take 1 tablet (20 mg total) by mouth daily. 90 tablet 3  . sildenafil (REVATIO) 20 MG tablet TAKE 1 TABLET BY MOUTH AS NEEDED FOR ERECTILE DYSFUNCTION. DO NO TAKE MORE THAN 1 DOSE IN 36 HOURS. 30 tablet 0   No current facility-administered medications on file prior to visit.     Allergies  Allergen Reactions  . Coumadin [Warfarin] Other (See Comments)    GI bleeds    Family History  Problem Relation Age of Onset  . Heart disease Father 57       Deceased  .  Heart attack Father 49  . Heart disease Brother 19       "Died in his sleep"  . Arthritis/Rheumatoid Mother        Deceased-86  . Hyperlipidemia Brother   . Stroke Brother 76       Deceased  . Other Sister        Estate agent in [place]  . Other Sister        MVA  . Other Brother        Carjacked-killed  . Dementia Paternal Grandmother   . Cancer Paternal Uncle   . Healthy Brother   . Healthy Son        X3  . Healthy Daughter        x1  . Colon cancer Neg Hx     Social History   Socioeconomic History  . Marital status: Married    Spouse name: Not on file  . Number of children: 6  . Years of education: Not on file  . Highest education level: Not on file  Occupational History  . 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 Financial resource strain: Not on file  . Food insecurity:    Worry: Not on file    Inability: Not on file  . Transportation needs:    Medical: Not on file    Non-medical: Not on file  Tobacco Use  . Smoking status: Never Smoker  . Smokeless tobacco: Never Used  Substance and Sexual Activity  . Alcohol use: No  . Drug use: No  . Sexual activity: Not on file  Lifestyle  . Physical activity:    Days per week: Not on file    Minutes per session: Not on file  . Stress: Not on file  Relationships  . Social connections:    Talks on phone: Not on file    Gets together: Not on file    Attends religious service: Not on file    Active member of club or organization: Not on file    Attends meetings of clubs or organizations: Not on file    Relationship status: Not on file  Other Topics Concern  . Not on file  Social History Narrative   4 caffeine drinks daily    Review of Systems - See HPI.  All other ROS are negative.  BP (!) 142/70   Pulse (!) 59   Temp (!) 97.5 F (36.4 C) (Oral)   Resp 16   Ht 6' (1.829 m)   Wt 217 lb (98.4 kg)   SpO2 97%   BMI 29.43 kg/m   Physical Exam Vitals signs reviewed.    Constitutional:      Appearance: Normal appearance.  HENT:     Head: Normocephalic  and atraumatic.     Right Ear: Tympanic membrane normal.     Left Ear: Tympanic membrane normal.     Nose: Nose normal.     Mouth/Throat:     Mouth: Mucous membranes are moist.  Eyes:     Conjunctiva/sclera: Conjunctivae normal.  Neck:     Musculoskeletal: Neck supple.  Cardiovascular:     Rate and Rhythm: Normal rate and regular rhythm.     Pulses: Normal pulses.     Heart sounds: Normal heart sounds.  Pulmonary:     Effort: Pulmonary effort is normal.     Breath sounds: Normal breath sounds.  Musculoskeletal:     Right hip: Normal.     Left hip: Normal.     Lumbar back: He exhibits pain. He exhibits normal range of motion, no tenderness and no spasm.  Neurological:     General: No focal deficit present.     Mental Status: He is alert and oriented to person, place, and time.    Assessment/Plan: 1. Encounter for long-term (current) use of high-risk medication 2. Encounter for chronic pain management CSC on file and up-to-date. UDS obtained today. Database reviewed without any discrepancy. Noted in PDMP review. Continue current medication regimen.  - Pain Mgmt, Profile 8 w/Conf, U   3. Acute right-sided low back pain with right-sided sciatica Atraumatic. No alarm signs/symptoms. Start Medrol pack. Continue chronic medications. OTC medications and supportive measures reviewed. Follow-up discussed.    Leeanne Rio, PA-C

## 2018-03-07 NOTE — Patient Instructions (Signed)
Please continue the chronic pain medication as directed. Start the Medrol dosepack to help with sciatica symptoms. No heavy lifting or overexertion.  If symptoms are not resolving we will need to consider imaging.

## 2018-03-07 NOTE — Telephone Encounter (Signed)
Eliquis 5 mg #3 Lot [id number] exp 6/22  Advised patient at front for pick up. Tried to schedule follow up visit but patient stated he would have to check with wife and call back.

## 2018-03-12 LAB — PAIN MGMT, PROFILE 8 W/CONF, U
6 Acetylmorphine: NEGATIVE ng/mL (ref <10)
Alcohol Metabolites: NEGATIVE ng/mL (ref <500)
Amphetamines: NEGATIVE ng/mL (ref <500)
Benzodiazepines: NEGATIVE ng/mL (ref <100)
Buprenorphine, Urine: NEGATIVE ng/mL (ref <5)
Cocaine Metabolite: NEGATIVE ng/mL (ref <150)
Codeine: NEGATIVE ng/mL (ref <50)
Creatinine: 143.9 mg/dL
Hydrocodone: 2389 ng/mL — ABNORMAL HIGH (ref <50)
Hydromorphone: 74 ng/mL — ABNORMAL HIGH (ref <50)
MDMA: NEGATIVE ng/mL (ref <500)
Marijuana Metabolite: NEGATIVE ng/mL (ref <20)
Morphine: NEGATIVE ng/mL (ref <50)
Norhydrocodone: 998 ng/mL — ABNORMAL HIGH (ref <50)
Opiates: POSITIVE ng/mL — AB (ref <100)
Oxidant: NEGATIVE ug/mL (ref <200)
Oxycodone: NEGATIVE ng/mL (ref <100)
pH: 6.5 (ref 4.5–9.0)

## 2018-03-17 ENCOUNTER — Other Ambulatory Visit: Payer: Self-pay | Admitting: Physician Assistant

## 2018-03-17 NOTE — Telephone Encounter (Signed)
Indication for chronic opioid: Osteoarthritis of Multiple Sites; ES OA of knees bilaterally, Ankylosing Spondylitis Medication and dose: Hydrocodone-APAP 10-325 mg # pills per month: 75 on 02/14/18 Last UDS date: 03/01/2017 Pain contract signed (date): 06/29/2017 Date narcotic database last reviewed (include red flags): 03/07/18 No red flags. Documented in PDMP.  LOV: 03/07/18  Please advised

## 2018-03-18 MED FILL — HYDROCODON-APAP 10-325: 10-325 | 19 days supply | Qty: 75 | Fill #0

## 2018-03-18 NOTE — Telephone Encounter (Signed)
Chart reviewed. Last UDS date 03/02/2018, at last visit.   Med refilled

## 2018-03-19 ENCOUNTER — Other Ambulatory Visit: Payer: Self-pay | Admitting: Physician Assistant

## 2018-03-31 ENCOUNTER — Telehealth: Payer: Self-pay | Admitting: Cardiology

## 2018-03-31 MED ORDER — APIXABAN 5 MG PO TABS: 5.0000 mg | ORAL_TABLET | Freq: Two times a day (BID) | ORAL | 0 refills | Status: DC

## 2018-03-31 NOTE — Telephone Encounter (Signed)
Patient calling the office for samples of medication: ° ° °1.  What medication and dosage are you requesting samples for? apixaban (ELIQUIS) 5 MG TABS tablet ° °2.  Are you currently out of this medication? no ° ° °

## 2018-03-31 NOTE — Telephone Encounter (Signed)
Aware 2 box of sample available  For pickup

## 2018-04-17 ENCOUNTER — Other Ambulatory Visit: Payer: Self-pay | Admitting: Physician Assistant

## 2018-04-17 ENCOUNTER — Telehealth: Payer: Self-pay | Admitting: Cardiology

## 2018-04-17 MED FILL — HYDROCODON-APAP 10-325: 10-325 | 19 days supply | Qty: 75 | Fill #0

## 2018-04-17 NOTE — Telephone Encounter (Signed)
Patient calling the office for samples of medication:   1.  What medication and dosage are you requesting samples for?   apixaban (ELIQUIS) 5 MG TABS tablet   2.  Are you currently out of this medication? No, still has a few left.

## 2018-04-17 NOTE — Telephone Encounter (Signed)
Samples at the front desk-pt notified

## 2018-04-17 NOTE — Telephone Encounter (Signed)
Indication for chronic opioid: Osteoarthritis of Multiple Sites; ES OA of knees bilaterally, Ankylosing Spondylitis Medication and dose: Hydrocodone-APAP 10-325 mg # pills per month: 75 Last UDS date: 03/07/18 Pain contract signed (date): 06/29/2017 Date narcotic database last reviewed (include red flags):  Today.No red flags. Documented in PDMP.

## 2018-05-20 ENCOUNTER — Other Ambulatory Visit: Payer: Self-pay | Admitting: Physician Assistant

## 2018-05-20 ENCOUNTER — Telehealth: Payer: Self-pay | Admitting: Cardiology

## 2018-05-20 ENCOUNTER — Other Ambulatory Visit: Payer: Self-pay

## 2018-05-20 MED ORDER — APIXABAN 5 MG PO TABS: 5.0000 mg | ORAL_TABLET | Freq: Two times a day (BID) | ORAL | 0 refills | Status: DC

## 2018-05-20 MED FILL — HYDROCODON-APAP 10-325: 10-325 | 19 days supply | Qty: 75 | Fill #0

## 2018-05-20 NOTE — Telephone Encounter (Signed)
  Patient calling the office for samples of medication:   1.  What medication and dosage are you requesting samples for? apixaban (ELIQUIS) 5 MG TABS tablet  2.  Are you currently out of this medication?   enough to last to Friday 05/23/18

## 2018-05-20 NOTE — Telephone Encounter (Signed)
Called patient, advised that we did not have samples at this time.  Sent in low supply to pharmacy so patient could try and pick up that way.  Patient will call back if any issues.

## 2018-05-20 NOTE — Telephone Encounter (Signed)
Indication for chronic opioid: OA, Ankylosing Spondylitis Medication and dose: Hydrocodone-APAP 10/325 mg # pills per month: 75 on 04/17/18 Last UDS date: 03/07/18 Opioid Treatment Agreement signed (Y/N): Yes, 07/24/17 Opioid Treatment Agreement last reviewed with patient:   [facility] reviewed this encounter (include red flags):     LOV: 03/07/18

## 2018-05-21 MED FILL — ELIQUIS 5 MG TABLET: 5 | 7 days supply | Qty: 14 | Fill #0

## 2018-05-22 ENCOUNTER — Other Ambulatory Visit: Payer: Self-pay | Admitting: Cardiology

## 2018-05-22 MED ORDER — DILTIAZEM HCL ER COATED BEADS 240 MG PO CP24: ORAL_CAPSULE | ORAL | 1 refills | Status: DC

## 2018-05-22 NOTE — Telephone Encounter (Signed)
Diltiazem 240 mg refilled.

## 2018-05-22 NOTE — Telephone Encounter (Signed)
 *  STAT* If patient is at the pharmacy, call can be transferred to refill team.   1. Which medications need to be refilled? (please list name of each medication and dose if known) diltiazem (CARDIZEM CD) 240 MG 24 hr capsule  2. Which pharmacy/location (including street and city if local pharmacy) is medication to be sent to? [facility]  3. Do they need a 30 day or 90 day supply? 90 days

## 2018-06-04 ENCOUNTER — Encounter: Payer: Self-pay | Admitting: Physician Assistant

## 2018-06-04 ENCOUNTER — Ambulatory Visit: Payer: Self-pay

## 2018-06-04 ENCOUNTER — Ambulatory Visit (INDEPENDENT_AMBULATORY_CARE_PROVIDER_SITE_OTHER): Payer: Medicare HMO | Admitting: Physician Assistant

## 2018-06-04 ENCOUNTER — Other Ambulatory Visit: Payer: Self-pay

## 2018-06-04 VITALS — BP 140/77 | HR 62

## 2018-06-04 DIAGNOSIS — M109 Gout, unspecified: Secondary | ICD-10-CM | POA: Diagnosis not present

## 2018-06-04 MED ORDER — PREDNISONE 10 MG PO TABS: ORAL_TABLET | ORAL | 0 refills | Status: AC

## 2018-06-04 NOTE — Progress Notes (Signed)
I have discussed the procedure for the virtual visit with the patient who has given consent to proceed with assessment and treatment.   Charlee Whitebread S Chavy Avera, CMA     

## 2018-06-04 NOTE — Progress Notes (Signed)
Virtual Visit via Video   I connected with patient on 06/04/18 at 10:40 AM EDT by a video enabled telemedicine application and verified that I am speaking with the correct person using two identifiers.  Location patient: Home Location provider: [facility], Office Persons participating in the virtual visit: Patient, Provider, [place] (Patina Moore)  I discussed the limitations of evaluation and management by telemedicine and the availability of in person appointments. The patient expressed understanding and agreed to proceed.  Subjective:   HPI:   Patient presents via [place] today c/o redness, swelling and pain of L forefoot starting around 2 AM. Denies trauma or injury. Notes feeling fine yesterday. Does have significant history of gout that has been pretty well-controlled with daily 100 mg Allopurinol. Patient endorses talking his Allopurinol as directed. Has been eating a lot of red meat recently due to being stuck at home during [place] pandemic. Is not hydrating as well as he does when working. Notes pain and swelling if of the distal left forefoot, just next to big toe. Denies any numbness or tingling. Denies radiation of pain elsewhere.  ROS:   See pertinent positives and negatives per HPI.  Patient Active Problem List   Diagnosis Date Noted  . Hyperlipidemia 02/06/2018  . Encounter for chronic pain management 03/01/2017  . Abnormal stress test 05/10/2016  . Elevated coronary artery calcium score 05/10/2016  . Hyperglycemia 04/23/2016  . Anxiety and depression 09/12/2015  . History of colonic polyps 03/02/2015  . Chronic anticoagulation 03/02/2015  . Multiple lung nodules 10/22/2014  . Lung nodule, multiple 08/24/2014  . Emphysema of lung ([place]) 07/28/2014  . Gout 06/30/2014  . Paroxysmal atrial fibrillation ([place]) 03/21/2012  . CAD (coronary artery disease) 03/21/2012  . GERD (gastroesophageal reflux disease) 03/21/2012  . COLONIC POLYPS, ADENOMATOUS 04/25/2007  .  Essential hypertension 04/25/2007  . VENTRICULAR HYPERTROPHY, LEFT 04/25/2007  . HIATAL HERNIA 04/25/2007  . ARTHRITIS 04/25/2007  . SLEEP APNEA 04/25/2007  . CHOLELITHIASIS, HX OF 04/25/2007  . NEPHROLITHIASIS, HX OF 04/25/2007  . Ankylosing spondylitis ([place]) 09/04/2006    Social History   Tobacco Use  . Smoking status: Never Smoker  . Smokeless tobacco: Never Used  Substance Use Topics  . Alcohol use: No    Current Outpatient Medications:  .  allopurinol (ZYLOPRIM) 100 MG tablet, TAKE 1 TABLET EVERY DAY, Disp: 90 tablet, Rfl: 1 .  apixaban (ELIQUIS) 5 MG TABS tablet, Take 1 tablet (5 mg total) by mouth 2 (two) times daily., Disp: 30 tablet, Rfl: 0 .  atorvastatin (LIPITOR) 10 MG tablet, TAKE 1 TABLET (10 MG TOTAL) BY MOUTH DAILY., Disp: 90 tablet, Rfl: 0 .  cyclobenzaprine (FLEXERIL) 10 MG tablet, Take 1 tablet (10 mg total) by mouth 3 (three) times daily as needed for muscle spasms., Disp: 15 tablet, Rfl: 0 .  diltiazem (CARDIZEM CD) 240 MG 24 hr capsule, TAKE 1 CAPSULE EVERY DAY (DUE FOR APPOINTMENT FOR FUTURE REFILLS), Disp: 90 capsule, Rfl: 1 .  escitalopram (LEXAPRO) 10 MG tablet, Take 1 tablet (10 mg total) by mouth daily., Disp: 30 tablet, Rfl: 1 .  furosemide (LASIX) 20 MG tablet, TAKE 1 TABLET EVERY DAY, Disp: 90 tablet, Rfl: 0 .  HYDROcodone-acetaminophen (NORCO) 10-325 MG tablet, TAKE 1 TABLET BY MOUTH EVERY 6 HOURS AS NEEDED, Disp: 75 tablet, Rfl: 0 .  KLOR-CON M20 20 MEQ tablet, TAKE 1 TABLET EVERY DAY, Disp: 90 tablet, Rfl: 1 .  Loperamide HCl (IMODIUM PO), Take by mouth as needed. Reported on 05/10/2015, Disp: ,  Rfl:  .  metoprolol succinate (TOPROL-XL) 100 MG 24 hr tablet, TAKE 1 TABLET  TWO TIMES DAILY  WITH OR IMMEDIATELY FOLLOWING A MEAL., Disp: 180 tablet, Rfl: 1 .  omeprazole (PRILOSEC) 40 MG capsule, Take 1 capsule (40 mg total) by mouth daily., Disp: 30 capsule, Rfl: 5 .  Potassium Chloride ER 20 MEQ TBCR, Take 20 mEq by mouth daily., Disp: 90 tablet, Rfl: 1 .   pravastatin (PRAVACHOL) 20 MG tablet, Take 1 tablet (20 mg total) by mouth daily., Disp: 90 tablet, Rfl: 3 .  sildenafil (REVATIO) 20 MG tablet, TAKE 1 TABLET BY MOUTH AS NEEDED FOR ERECTILE DYSFUNCTION. DO NO TAKE MORE THAN 1 DOSE IN 36 HOURS., Disp: 30 tablet, Rfl: 0  Allergies  Allergen Reactions  . Coumadin [Warfarin] Other (See Comments)    GI bleeds    Objective:   BP 140/77   Pulse 62   Patient is well-developed, well-nourished in no acute distress.  Resting comfortably at home.  Head is normocephalic, atraumatic.  No labored breathing.  Speech is clear and coherent with logical contest.  Patient is alert and oriented at baseline.   Left MTP joint red and swollen on exam. No noted bruising or gross deformity noted in the area on video exam. He does have some evidence of hemosiderin deposition in the distal lower extremities bilaterally without current edema.  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 Continue prophylactic Allopurinol. Increase water intake. Cut back on food triggers (big intake recently). Rx Prednisone taper to calm down flare. Follow-up if not resolving.     Leeanne Rio, PA-C 06/04/2018

## 2018-06-04 NOTE — Telephone Encounter (Signed)
Pt c/o left foot pain and edema. Pt stated he took his pain medication and has not gotten any relief. Pt stated he thinks it is a gout flare. No redness. No fever.  Care advice given and pt verbalized understanding. Email verified. Call transferred to office to schedule virtual visit. Reason for Disposition . [1] SEVERE pain (e.g., excruciating, unable to do any normal activities) AND [2] not improved after 2 hours of pain medicine  Answer Assessment - Initial Assessment Questions 1. ONSET: "When did the pain start?"      2 am this morning 2. LOCATION: "Where is the pain located?"      Left foot starts ankle to the top of the foot 3. PAIN: "How bad is the pain?"    (Scale 1-10; or mild, moderate, severe)   -  MILD (1-3): doesn't interfere with normal activities    -  MODERATE (4-7): interferes with normal activities (e.g., work or school) or awakens from sleep, limping    -  SEVERE (8-10): excruciating pain, unable to do any normal activities, unable to walk   severe 4. WORK OR EXERCISE: "Has there been any recent work or exercise that involved this part of the body?"      no 5. CAUSE: "What do you think is causing the foot pain?"     gout 6. OTHER SYMPTOMS: "Do you have any other symptoms?" (e.g., leg pain, rash, fever, numbness)   Edema from the ankle down 7. PREGNANCY: "Is there any chance you are pregnant?" "When was your last menstrual period?"    n/a  Protocols used: FOOT PAIN-A-AH

## 2018-06-09 ENCOUNTER — Other Ambulatory Visit: Payer: Self-pay | Admitting: Cardiology

## 2018-06-09 ENCOUNTER — Encounter: Payer: Self-pay | Admitting: Gastroenterology

## 2018-06-09 MED FILL — ELIQUIS 5 MG TABLET: 5 | 7 days supply | Qty: 14 | Fill #1

## 2018-06-09 NOTE — Telephone Encounter (Signed)
Follow up    Pt is calling back wondering about the samples of Eliquis   Please call

## 2018-06-09 NOTE — Telephone Encounter (Signed)
Spoke with pt and advised that unfortunately we are currently out of Eliquis samples. Pt voiced understanding.

## 2018-06-09 NOTE — Telephone Encounter (Signed)
Patient calling the office for samples of medication: ° ° °1.  What medication and dosage are you requesting samples for? °Eliquis 5 mg  °2.  Are you currently out of this medication?  °Yes ° ° ° °

## 2018-06-16 ENCOUNTER — Telehealth: Payer: Self-pay | Admitting: Cardiology

## 2018-06-16 ENCOUNTER — Other Ambulatory Visit: Payer: Self-pay | Admitting: Physician Assistant

## 2018-06-16 MED FILL — HYDROCODON-APAP 10-325: 10-325 | 18 days supply | Qty: 75 | Fill #0

## 2018-06-16 NOTE — Telephone Encounter (Signed)
Patient calling the office for samples of medication:   1.  What medication and dosage are you requesting samples for? eliquis   2.  Are you currently out of this medication?  Pt has a few days left

## 2018-06-16 NOTE — Telephone Encounter (Signed)
Spoke with pt and informed that we are currently out of samples for Eliquis. Pt voiced understanding and will try to call back towards the middle of the week.

## 2018-06-16 NOTE — Telephone Encounter (Signed)
Last UDS is 03/07/2018 per EMR review. No red flags. PDMP reviewed again today. No red flags.  Is due for follow-up for chronic pain before next refill -- please call patient to schedule sometime within the next 4 weeks.

## 2018-06-16 NOTE — Telephone Encounter (Signed)
  Indication for chronic opioid: Osteoarthritis of Multiple Sites; ES OA of knees bilaterally, Ankylosing Spondylitis Medication and dose: Hydrocodone-APAP 10-325 mg # pills per month: 75 on 05/20/18 Last UDS date: 03/01/2017 Pain contract signed (date): 06/29/2017 Date narcotic database last reviewed (include red flags): 03/07/18.No red flags. Documented in PDMP.

## 2018-06-16 NOTE — Telephone Encounter (Signed)
Follow up ° ° °Patient is returning your call per the previous message. Please call. °

## 2018-06-16 NOTE — Telephone Encounter (Signed)
Left message to call back  

## 2018-06-17 ENCOUNTER — Encounter: Payer: Self-pay | Admitting: Emergency Medicine

## 2018-06-17 NOTE — Telephone Encounter (Signed)
My chart message sent to patient to schedule a chronic pain follow up in 4 weeks for his next medication refill of the Hydrocodone

## 2018-06-19 ENCOUNTER — Telehealth: Payer: Self-pay | Admitting: Cardiology

## 2018-06-19 NOTE — Telephone Encounter (Signed)
Pt called following up on the TOPROL-XL  100 mg  Stated he mail in [place] requested [facility] still has not gotten.   Pt would all so like some samples of   ELIQUIS 5mg    Please give him a call with any questions.    *STAT* If patient is at the pharmacy, call can be transferred to refill team.   1. Which medications need to be refilled? (please list name of each medication and dose if known) TOPROL    And samples of  ELIQUIS  2. Which pharmacy/location (including street and city if local pharmacy) is medication to be sent to? [facility]  Stated no reply    3. Do they need a 30 day or 90 day supply? [place]

## 2018-06-20 NOTE — Telephone Encounter (Signed)
Spoke with pt and informed that we are currently out of Eliquis samples. Will mail patient assistance form and send message to Care guide coordinator.

## 2018-06-20 NOTE — Telephone Encounter (Signed)
Patient calling the office for samples of medication:   1.  What medication and dosage are you requesting samples for?  apixaban (ELIQUIS) 5 MG TABS tablet  2.  Are you currently out of this medication? Patient has one pill left.

## 2018-06-24 MED FILL — ELIQUIS 5 MG TABLET: 5 | 7 days supply | Qty: 14 | Fill #2

## 2018-07-09 MED FILL — ELIQUIS 5 MG TABLET: 5 | 5 days supply | Qty: 10 | Fill #0

## 2018-07-11 ENCOUNTER — Telehealth: Payer: Self-pay | Admitting: Cardiology

## 2018-07-11 MED ORDER — METOPROLOL SUCCINATE ER 100 MG PO TB24: ORAL_TABLET | ORAL | 1 refills | Status: DC

## 2018-07-11 NOTE — Telephone Encounter (Signed)
New message   Pt c/o medication issue:  1. Name of Medication:metoprolol succinate (TOPROL-XL) 100 MG 24 hr tablet  2. How are you currently taking this medication (dosage and times per day)?  2 times daily  3. Are you having a reaction (difficulty breathing--STAT) n/a  4. What is your medication issue? Patient states that he needs pre authorization for this medication to go to [facility].

## 2018-07-11 NOTE — Telephone Encounter (Signed)
New Message     Patient calling the office for samples of medication:   1.  What medication and dosage are you requesting samples for? Eliquis 5mg    2.  Are you currently out of this medication? Pt will be out Monday

## 2018-07-11 NOTE — Telephone Encounter (Signed)
Left message for patient, samples at the front desk for pick up. 

## 2018-07-11 NOTE — Telephone Encounter (Signed)
Refilled as requested.   Needs follow up visit scheduled, last seen 06/2017 and was to f/u 6 monhts  Left message to call back

## 2018-07-14 ENCOUNTER — Other Ambulatory Visit: Payer: Self-pay | Admitting: Physician Assistant

## 2018-07-14 NOTE — Telephone Encounter (Signed)
Scheduled virtual visit for patient in July with Janan Ridge PA

## 2018-07-14 NOTE — Telephone Encounter (Signed)
Follow up    Patient is returning  Call.

## 2018-07-16 ENCOUNTER — Encounter: Payer: Self-pay | Admitting: Physician Assistant

## 2018-07-16 ENCOUNTER — Ambulatory Visit (INDEPENDENT_AMBULATORY_CARE_PROVIDER_SITE_OTHER): Payer: Medicare HMO | Admitting: Physician Assistant

## 2018-07-16 ENCOUNTER — Other Ambulatory Visit: Payer: Self-pay

## 2018-07-16 VITALS — BP 120/63 | HR 64 | Temp 98.5°F | Resp 16 | Wt 215.2 lb

## 2018-07-16 DIAGNOSIS — G8929 Other chronic pain: Secondary | ICD-10-CM | POA: Diagnosis not present

## 2018-07-16 DIAGNOSIS — M170 Bilateral primary osteoarthritis of knee: Secondary | ICD-10-CM | POA: Diagnosis not present

## 2018-07-16 MED ORDER — HYDROCODONE-ACETAMINOPHEN 10-325 MG PO TABS: 1.0000 | ORAL_TABLET | Freq: Four times a day (QID) | ORAL | 0 refills | Status: DC | PRN

## 2018-07-16 MED FILL — HYDROCODON-APAP 10-325: 10-325 | 18 days supply | Qty: 75 | Fill #0

## 2018-07-16 NOTE — Progress Notes (Signed)
Virtual Visit via Video   I connected with patient on 07/16/18 at  2:20 PM EDT by a video enabled telemedicine application and verified that I am speaking with the correct person using two identifiers.  Location patient: Home Location provider: [facility], Office Persons participating in the virtual visit: Patient, Provider, [place] (Patina Moore)  I discussed the limitations of evaluation and management by telemedicine and the availability of in person appointments. The patient expressed understanding and agreed to proceed.  Subjective:   HPI:   Reviewed and updated  Today.  Indication for chronic opioid: Osteoarthritis of Multiple sites, ES OA knees bilaterally Medication and dose: Hydrocodone-APAP 10-325 mg # pills per month: 75 Last UDS date: 03/2018 Pain contract signed (date): 06/29/2017 Date narcotic database last reviewed (include red flags): today. No red flags.   Pain Inventory (1-10 worse): Average Pain 6-7/10 Pain Right Now 5 My pain is dull (character i.e. sharp, stabbing, dull, constant etc)  Pain is worse with: exertion, prolonged standing Relief from Meds: good relief with medicatiuons  In the last 24 hours, has pain interfered with the following (1-10 greatest interference) ? General activity 7/10 Relation with others 0/10 Enjoyment of life 4/10 What TIME of day is your pain at its worst? evening       Sleep (in general). Stable.  Mobility/Function: Assistance device: none How many minutes can you walk? 15 Ability to climb steps?  Yes but limited Do you drive? Yes  Neuro/Psych Sx: (bladder, bowel, weakness, dizziness, depression etc) None Prior Studies: See EMR Physicians involved in your care: Any changes since last visit?  None since last visit. See list of Care Team   ROS:   See pertinent positives and negatives per HPI.  Patient Active Problem List   Diagnosis Date Noted  . Hyperlipidemia 02/06/2018  . Encounter for chronic pain  management 03/01/2017  . Abnormal stress test 05/10/2016  . Elevated coronary artery calcium score 05/10/2016  . Hyperglycemia 04/23/2016  . Anxiety and depression 09/12/2015  . History of colonic polyps 03/02/2015  . Chronic anticoagulation 03/02/2015  . Multiple lung nodules 10/22/2014  . Lung nodule, multiple 08/24/2014  . Emphysema of lung ([place]) 07/28/2014  . Gout 06/30/2014  . Paroxysmal atrial fibrillation ([place]) 03/21/2012  . CAD (coronary artery disease) 03/21/2012  . GERD (gastroesophageal reflux disease) 03/21/2012  . COLONIC POLYPS, ADENOMATOUS 04/25/2007  . Essential hypertension 04/25/2007  . VENTRICULAR HYPERTROPHY, LEFT 04/25/2007  . HIATAL HERNIA 04/25/2007  . ARTHRITIS 04/25/2007  . SLEEP APNEA 04/25/2007  . CHOLELITHIASIS, HX OF 04/25/2007  . NEPHROLITHIASIS, HX OF 04/25/2007  . Ankylosing spondylitis ([place]) 09/04/2006    Social History   Tobacco Use  . Smoking status: Never Smoker  . Smokeless tobacco: Never Used  Substance Use Topics  . Alcohol use: No    Current Outpatient Medications:  .  allopurinol (ZYLOPRIM) 100 MG tablet, TAKE 1 TABLET EVERY DAY, Disp: 90 tablet, Rfl: 1 .  apixaban (ELIQUIS) 5 MG TABS tablet, Take 1 tablet (5 mg total) by mouth 2 (two) times daily., Disp: 30 tablet, Rfl: 0 .  atorvastatin (LIPITOR) 10 MG tablet, TAKE 1 TABLET (10 MG TOTAL) BY MOUTH DAILY., Disp: 90 tablet, Rfl: 0 .  cyclobenzaprine (FLEXERIL) 10 MG tablet, Take 1 tablet (10 mg total) by mouth 3 (three) times daily as needed for muscle spasms., Disp: 15 tablet, Rfl: 0 .  diltiazem (CARDIZEM CD) 240 MG 24 hr capsule, TAKE 1 CAPSULE EVERY DAY (DUE FOR APPOINTMENT FOR FUTURE REFILLS), Disp:  90 capsule, Rfl: 1 .  escitalopram (LEXAPRO) 10 MG tablet, Take 1 tablet (10 mg total) by mouth daily., Disp: 30 tablet, Rfl: 1 .  furosemide (LASIX) 20 MG tablet, TAKE 1 TABLET EVERY DAY, Disp: 90 tablet, Rfl: 0 .  HYDROcodone-acetaminophen (NORCO) 10-325 MG tablet, TAKE 1 TABLET BY  MOUTH EVERY 6 HOURS AS NEEDED, Disp: 75 tablet, Rfl: 0 .  KLOR-CON M20 20 MEQ tablet, TAKE 1 TABLET EVERY DAY, Disp: 90 tablet, Rfl: 1 .  Loperamide HCl (IMODIUM PO), Take by mouth as needed. Reported on 05/10/2015, Disp: , Rfl:  .  metoprolol succinate (TOPROL-XL) 100 MG 24 hr tablet, TAKE 1 TABLET  TWO TIMES DAILY  WITH OR IMMEDIATELY FOLLOWING A MEAL., Disp: 180 tablet, Rfl: 1 .  omeprazole (PRILOSEC) 40 MG capsule, Take 1 capsule (40 mg total) by mouth daily., Disp: 30 capsule, Rfl: 5 .  Potassium Chloride ER 20 MEQ TBCR, Take 20 mEq by mouth daily., Disp: 90 tablet, Rfl: 1 .  pravastatin (PRAVACHOL) 20 MG tablet, Take 1 tablet (20 mg total) by mouth daily., Disp: 90 tablet, Rfl: 3 .  sildenafil (REVATIO) 20 MG tablet, TAKE 1 TABLET BY MOUTH AS NEEDED FOR ERECTILE DYSFUNCTION. DO NO TAKE MORE THAN 1 DOSE IN 36 HOURS., Disp: 30 tablet, Rfl: 0  Allergies  Allergen Reactions  . Coumadin [Warfarin] Other (See Comments)    GI bleeds    Objective:   BP 120/63   Pulse 64   Temp 98.5 F (36.9 C) (Oral)   Resp 16   Wt 215 lb 3.2 oz (97.6 kg)   BMI 29.19 kg/m   Patient is well-developed, well-nourished in no acute distress.  Resting comfortably at home.  Head is normocephalic, atraumatic.  No labored breathing.  Speech is clear and coherent with logical contest.  Patient is alert and oriented at baseline.   Assessment and Plan:   1. Encounter for chronic pain management Stable. Continue current regimen. Due for update of CSC but trying to keep out of office due to [place] currently. We will update this at next follow-up in office. PDMP reviewed. No red flags. Medications refilled.     Leeanne Rio, PA-C 07/16/2018

## 2018-07-16 NOTE — Progress Notes (Signed)
I have discussed the procedure for the virtual visit with the patient who has given consent to proceed with assessment and treatment.   Bryanda Mikel S Viyan Rosamond, CMA     

## 2018-07-17 ENCOUNTER — Other Ambulatory Visit: Payer: Self-pay | Admitting: Physician Assistant

## 2018-07-23 ENCOUNTER — Telehealth: Payer: Self-pay | Admitting: Cardiology

## 2018-07-23 NOTE — Telephone Encounter (Signed)
Informed pt that there is no current samples available at this time. Pt voiced understanding.

## 2018-07-23 NOTE — Telephone Encounter (Signed)
Patient calling the office for samples of medication:   1.  What medication and dosage are you requesting samples for? apixaban (ELIQUIS) 5 MG TABS tablet  2.  Are you currently out of this medication? No, has a few pills left.

## 2018-07-25 ENCOUNTER — Telehealth: Payer: Self-pay | Admitting: Physician Assistant

## 2018-07-25 NOTE — Telephone Encounter (Signed)
smartphone/ consent/ my chart/ pre reg completed °

## 2018-07-29 MED FILL — ELIQUIS 5 MG TABLET: 5 | 7 days supply | Qty: 14 | Fill #1

## 2018-07-30 ENCOUNTER — Telehealth (INDEPENDENT_AMBULATORY_CARE_PROVIDER_SITE_OTHER): Payer: Medicare HMO | Admitting: Physician Assistant

## 2018-07-30 VITALS — BP 144/88 | HR 79 | Temp 97.9°F | Ht 72.0 in | Wt 213.6 lb

## 2018-07-30 DIAGNOSIS — I482 Chronic atrial fibrillation, unspecified: Secondary | ICD-10-CM

## 2018-07-30 DIAGNOSIS — Z79899 Other long term (current) drug therapy: Secondary | ICD-10-CM

## 2018-07-30 DIAGNOSIS — I2510 Atherosclerotic heart disease of native coronary artery without angina pectoris: Secondary | ICD-10-CM | POA: Diagnosis not present

## 2018-07-30 DIAGNOSIS — I10 Essential (primary) hypertension: Secondary | ICD-10-CM

## 2018-07-30 NOTE — Patient Instructions (Addendum)
Medication Instructions:   Your physician recommends that you continue on your current medications as directed. Please refer to the Current Medication list given to you today.  If you need a refill on your cardiac medications before your next appointment, please call your pharmacy.   Lab work:  You will need to have labs (blood work) drawn in our office or your PCP office before your 6 month follow up virtual appointment:  BMET  If you have labs (blood work) drawn today and your tests are completely normal, you will receive your results only by: Marland Kitchen MyChart Message (if you have MyChart) OR . A paper copy in the mail If you have any lab test that is abnormal or we need to change your treatment, we will call you to review the results.  Testing/Procedures:  NONE ordered at this time of appointment   Follow-Up: At [facility], you and your health needs are our priority.  As part of our continuing mission to provide you with exceptional heart care, we have created designated Provider Care Teams.  These Care Teams include your primary Cardiologist (physician) and Advanced Practice Providers (APPs -  Physician Assistants and Nurse Practitioners) who all work together to provide you with the care you need, when you need it. You will need a virtual follow up appointment in 6 months (Janurary 2021).  Please call our office in October 2020 to schedule this appointment.  You may see Minus Breeding, MD or one of the following Advanced Practice Providers on your designated Care Team:   Rosaria Ferries, PA-C . Jory Sims, DNP, ANP  Any Other Special Instructions Will Be Listed Below (If Applicable).

## 2018-07-30 NOTE — Progress Notes (Signed)
Virtual Visit via Video Note   This visit type was conducted due to national recommendations for restrictions regarding the COVID-19 Pandemic (e.g. social distancing) in an effort to limit this patient's exposure and mitigate transmission in our community.  Due to his co-morbid illnesses, this patient is at least at moderate risk for complications without adequate follow up.  This format is felt to be most appropriate for this patient at this time.  All issues noted in this document were discussed and addressed.  A limited physical exam was performed with this format.  Please refer to the patient's chart for his consent to telehealth for [facility].   Date:  07/31/2018   ID:  [name] Woods, DOB [birth year], MRN [id number]  Patient Location: Home Provider Location: Office  PCP:  Brunetta Jeans, PA-C  Cardiologist:  Minus Breeding, MD  Electrophysiologist:  None   Evaluation Performed:  Follow-Up Visit  Chief Complaint:  followup  History of Present Illness:    [name] Woods is a 78 y.o. male with chronic atrial fibrillation, minimal CAD and hypertension.  He had a cardiac catheterization on 03/24/2012 which showed very mild nonobstructive CAD.  EF was mildly reduced to 45% the time.  Repeat echocardiogram in June 2015 showed an EF improved to 55%.  ETT in March 2018 showed ST segment depressions in inferolateral leads, poor exercise tolerance.  Echocardiogram in March 2018 showed EF 55 to 60%, mild AI, small posterior pericardial effusion.  He did have 2 episode of presyncope and was seen in September 2018, it was decided to watch and wait to see if he has any more episodes.  His diltiazem was increased at the time. He was last seen by Dr. Percival Spanish in June 2019, he has not had any further dizzy spell.  Patient was contacted today via doximity video conference visit.  He denies any recent exertional chest pain or shortness of breath.  He does not do much strenuous activity  however does walk up and down stairs on a daily basis.  He denies any lower extremity edema, orthopnea or PND.  He has no bleeding issues on the Eliquis.  For some reason he says he is no longer taking the potassium.  However he still takes the low-dose Lasix.  He will need a basic metabolic panel to make sure his potassium level is normal on the diuretic.  Overall I think he is doing quite well from cardiology perspective and [name] follow-up with Dr. Percival Spanish in 6 months as a virtual visit.  The patient does not have symptoms concerning for COVID-19 infection (fever, chills, cough, or new shortness of breath).   162/85 before morning medication 144/88 after medication. No HR   Past Medical History:  Diagnosis Date  . Ankylosing spondylitis ([place])   . Arthritis   . CAD (coronary artery disease) 2585,2778   30% mid LAD lesion on cardiac cath  . Chicken pox   . Cholelithiasis   . GERD (gastroesophageal reflux disease)   . [place] measles   . Hiatal hernia   . Hypertension   . Nephrolithiasis   . Persistent atrial fibrillation   . Sleep apnea    No CPAP  . 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 Ventricular hypertrophy    Past Surgical History:  Procedure Laterality Date  . CARDIAC CATHETERIZATION  03/2012   30% mid LAD lesion otherwise normal cors, LVEF 65%  . CHOLECYSTECTOMY    . ELBOW ARTHROPLASTY     Left  .  LEFT AND RIGHT HEART CATHETERIZATION WITH CORONARY ANGIOGRAM N/A 03/24/2012   Procedure: LEFT AND RIGHT HEART CATHETERIZATION WITH CORONARY ANGIOGRAM;  Surgeon: Sherren Mocha, MD;  Location: [facility] CATH LAB;  Service: Cardiovascular;  Laterality: N/A;  . LEG FLUID REMOVAL RIGHT    . TONSILLECTOMY       Current Meds  Medication Sig  . allopurinol (ZYLOPRIM) 100 MG tablet TAKE 1 TABLET EVERY DAY  . apixaban (ELIQUIS) 5 MG TABS tablet Take 1 tablet (5 mg total) by mouth 2 (two) times daily.  Marland Kitchen atorvastatin (LIPITOR) 10 MG tablet TAKE 1 TABLET EVERY DAY  . cyclobenzaprine (FLEXERIL)  10 MG tablet Take 1 tablet (10 mg total) by mouth 3 (three) times daily as needed for muscle spasms.  Marland Kitchen diltiazem (CARDIZEM CD) 240 MG 24 hr capsule TAKE 1 CAPSULE EVERY DAY (DUE FOR APPOINTMENT FOR FUTURE REFILLS)  . escitalopram (LEXAPRO) 10 MG tablet Take 1 tablet (10 mg total) by mouth daily.  . furosemide (LASIX) 20 MG tablet TAKE 1 TABLET EVERY DAY  . HYDROcodone-acetaminophen (NORCO) 10-325 MG tablet Take 1 tablet by mouth every 6 (six) hours as needed.  . Loperamide HCl (IMODIUM PO) Take by mouth as needed. Reported on 05/10/2015  . metoprolol succinate (TOPROL-XL) 100 MG 24 hr tablet TAKE 1 TABLET  TWO TIMES DAILY  WITH OR IMMEDIATELY FOLLOWING A MEAL.  Marland Kitchen omeprazole (PRILOSEC) 40 MG capsule Take 1 capsule (40 mg total) by mouth daily.  . sildenafil (REVATIO) 20 MG tablet TAKE 1 TABLET BY MOUTH AS NEEDED FOR ERECTILE DYSFUNCTION. DO NO TAKE MORE THAN 1 DOSE IN 36 HOURS.  . [DISCONTINUED] KLOR-CON M20 20 MEQ tablet TAKE 1 TABLET EVERY DAY  . [DISCONTINUED] Potassium Chloride ER 20 MEQ TBCR Take 20 mEq by mouth daily.  . [DISCONTINUED] pravastatin (PRAVACHOL) 20 MG tablet Take 1 tablet (20 mg total) by mouth daily.     Allergies:   Coumadin [warfarin]   Social History   Tobacco Use  . Smoking status: Never Smoker  . Smokeless tobacco: Never Used  Substance Use Topics  . Alcohol use: No  . Drug use: No     Family Hx: The patient's family history includes Arthritis/Rheumatoid in his mother; Cancer in his paternal uncle; Dementia in his paternal grandmother; Healthy in his brother, daughter, and son; Heart attack (age of onset: 100) in his father; Heart disease (age of onset: 86) in his father; Heart disease (age of onset: 48) in his brother; Hyperlipidemia in his brother; Other in his brother, sister, and sister; Stroke (age of onset: 78) in his brother. There is no history of Colon cancer.  ROS:   Please see the history of present illness.     All other systems reviewed and are  negative.   Prior CV studies:   The following studies were reviewed today:  Cath 03/24/2012 Procedural Findings: Hemodynamics RA 8 RV 32/8 PA 29/14 mean 20 PCWP 10 LV 104/18 AO 107/64  Oxygen saturations: PA 64 AO 92  Cardiac Output (Fick) 4.7  Cardiac Index (Fick) 2.2  Coronary angiography: Coronary dominance: right  Left mainstem: Widely patent no obstructive disease  Left anterior descending (LAD): Widely patent in proximal vessel. Mid-vessel with mild irregularity. Diag widely patent. No significant stenosis.  Left circumflex (LCx): Large vessel without obstructive disease. Large OM without significant stenosis.  Right coronary artery (RCA): Moderate caliber vessel. Mild irregularity in the mid vessel with no more than 30% stenosis. Large acute marginal branch without significant stenosis. PDA and PLA  branches are small without significant disease.  Left ventriculography: deferred  Final Conclusions:  1. Patent coronary arteries with minor nonobstructive CAD 2. Essentially normal hemodynamics  Recommendations: Resume oral lasix and resume apixaban in the am. Increased beta-blocker was ordered this am for better rate-control.    ETT 04/10/2016 Study Highlights     Blood pressure demonstrated a hypertensive response to exercise.  ST segment depression was noted during stress in the II, III, aVF, V5 and V6 leads.  ETT with poor exercise tolerance (5: 05); no chest pain; hypertensive BP response; pt in atrial fibrillation during the study; significant artifact; 1-2 mm ST depression in the inferolateral leads with peak exertion felt to be abnormal.       Echo 04/16/2016 LV EF: 55% - 60%  Study Conclusions  - Left ventricle: The cavity size was normal. Systolic function was normal. The estimated ejection fraction was in the range of 55% to 60%. Wall motion was normal; there were no regional wall motion  abnormalities. - Aortic valve: There was mild regurgitation. - Left atrium: The atrium was mildly dilated. - Atrial septum: No defect or patent foramen ovale was identified. - Pericardium, extracardiac: Small posteriro pericardial effusion   Labs/Other Tests and Data Reviewed:    EKG:  An ECG dated 10/17/2016 was personally reviewed today and demonstrated:  Atrial fibrillation, rate controlled  Recent Labs: No results found for requested labs within last 8760 hours.   Recent Lipid Panel Lab Results  Component Value Date/Time   CHOL 124 07/22/2017 09:45 AM   TRIG 77 07/22/2017 09:45 AM   HDL 37 (L) 07/22/2017 09:45 AM   CHOLHDL 3.4 07/22/2017 09:45 AM   CHOLHDL 4 03/02/2016 10:16 AM   LDLCALC 72 07/22/2017 09:45 AM    Wt Readings from Last 3 Encounters:  07/30/18 213 lb 9.6 oz (96.9 kg)  07/16/18 215 lb 3.2 oz (97.6 kg)  03/07/18 217 lb (98.4 kg)     Objective:    Vital Signs:  BP (!) 144/88   Pulse 79   Temp 97.9 F (36.6 C)   Ht 6' (1.829 m)   Wt 213 lb 9.6 oz (96.9 kg)   BMI 28.97 kg/m    VITAL SIGNS:  reviewed  ASSESSMENT & PLAN:    1. CAD: Patient denies any recent exertional chest discomfort.  Previous cardiac catheterization in 2014 showed a very minimal amount of disease.  Not on aspirin given the need for Eliquis.  2. Chronic atrial fibrillation: Continue Eliquis, metoprolol and the diltiazem.  3. Hypertension: Blood pressure borderline elevated this morning however it was normal last month.   COVID-19 Education: The signs and symptoms of COVID-19 were discussed with the patient and how to seek care for testing (follow up with PCP or arrange E-visit).  The importance of social distancing was discussed today.  Time:   Today, I have spent 11 minutes with the patient with telehealth technology discussing the above problems.     Medication Adjustments/Labs and Tests Ordered: Current medicines are reviewed at length with the patient today.  Concerns  regarding medicines are outlined above.   Tests Ordered: Orders Placed This Encounter  Procedures  . Basic metabolic panel    Medication Changes: No orders of the defined types were placed in this encounter.   Follow Up:  Virtual Visit in 6 month(s)  Signed, Almyra Deforest, [place]  07/31/2018 [place]

## 2018-08-12 ENCOUNTER — Other Ambulatory Visit: Payer: Self-pay | Admitting: Physician Assistant

## 2018-08-12 MED FILL — ELIQUIS 5 MG TABLET: 5 | 7 days supply | Qty: 14 | Fill #2

## 2018-08-12 NOTE — Telephone Encounter (Signed)
Last refill:07/16/18 #75, 0 Last OV:06/04/18, doxy.me Dx. Podagra

## 2018-08-13 ENCOUNTER — Telehealth: Payer: Self-pay | Admitting: Physician Assistant

## 2018-08-13 MED ORDER — ALLOPURINOL 100 MG PO TABS: 100.0000 mg | ORAL_TABLET | Freq: Every day | ORAL | 1 refills | Status: DC

## 2018-08-13 MED FILL — HYDROCODON-APAP 10-325: 10-325 | 19 days supply | Qty: 75 | Fill #0

## 2018-08-13 NOTE — Telephone Encounter (Signed)
Medication Refill - Medication: allopurinol (ZYLOPRIM) 100 MG tablet, HYDROcodone-acetaminophen (NORCO) 10-325 MG tablet        Pt would like allopurinol (ZYLOPRIM) 100 MG tablet sent to: [facility], [place] [phone] (Phone) [phone] (Fax)   Pt would like  HYDROcodone-acetaminophen ([place]) 10-325 MG tablet sent to:  [place], [place] - [address]. [phone] (Phone) [phone] (Fax)    Pt was advised that RX refills may take up to 3 business days. We ask that you follow-up with your pharmacy.

## 2018-08-13 NOTE — Telephone Encounter (Signed)
Last OV was actually 07/16/2018 for his pain. He is up-to-date on parameters. Refill sent.

## 2018-08-13 NOTE — Telephone Encounter (Signed)
Both prescription has been sent to pharmacy.

## 2018-08-22 MED FILL — ELIQUIS 5 MG TABLET: 5 | 7 days supply | Qty: 14 | Fill #3

## 2018-09-01 ENCOUNTER — Telehealth: Payer: Self-pay | Admitting: Cardiology

## 2018-09-01 MED FILL — ELIQUIS 5 MG TABLET: 5 | 7 days supply | Qty: 14 | Fill #4

## 2018-09-01 NOTE — Telephone Encounter (Signed)
Samples at the front desk Pt notified, will CB for more samples

## 2018-09-01 NOTE — Telephone Encounter (Signed)
New Message   Patient calling the office for samples of medication:   1.  What medication and dosage are you requesting samples for? apixaban (ELIQUIS) 5 MG TABS tablet    2.  Are you currently out of this medication? No  3 remaining

## 2018-09-11 MED FILL — ELIQUIS 5 MG TABLET: 5 | 7 days supply | Qty: 14 | Fill #5

## 2018-09-16 ENCOUNTER — Telehealth: Payer: Self-pay | Admitting: Cardiology

## 2018-09-16 ENCOUNTER — Other Ambulatory Visit: Payer: Self-pay | Admitting: Physician Assistant

## 2018-09-16 MED FILL — SILDENAFIL CITRATE 20 MG TA: 20 | 10 days supply | Qty: 5 | Fill #2

## 2018-09-16 MED FILL — HYDROCODON-APAP 10-325: 10-325 | 18 days supply | Qty: 75 | Fill #0

## 2018-09-16 NOTE — Telephone Encounter (Signed)
Patient aware samples are at the front desk for pick up  

## 2018-09-16 NOTE — Telephone Encounter (Signed)
° ° °  Patient calling the office for samples of medication:   1.  What medication and dosage are you requesting samples for? Eliquis 2.  Are you currently out of this medication? NO

## 2018-09-16 NOTE — Telephone Encounter (Signed)
Indication for chronic opioid: Osteoarthritis of Multiple Sites Medication and dose: Hydrocodone-APAP 10/325 mg # pills per month: 75 on 08/13/18 Last UDS date: 03/07/18 Opioid Treatment Agreement signed (Y/N): Yes, 07/24/17 Opioid Treatment Agreement last reviewed with patient:   N[facility] reviewed this encounter (include red flags):

## 2018-09-25 ENCOUNTER — Encounter: Payer: Self-pay | Admitting: Physician Assistant

## 2018-09-25 ENCOUNTER — Other Ambulatory Visit: Payer: Self-pay

## 2018-09-25 DIAGNOSIS — Z20822 Contact with and (suspected) exposure to covid-19: Secondary | ICD-10-CM

## 2018-09-25 DIAGNOSIS — R6889 Other general symptoms and signs: Secondary | ICD-10-CM | POA: Diagnosis not present

## 2018-09-26 ENCOUNTER — Other Ambulatory Visit: Payer: Self-pay | Admitting: Cardiology

## 2018-09-27 LAB — NOVEL CORONAVIRUS, NAA: SARS-CoV-2, NAA: NOT DETECTED

## 2018-10-08 ENCOUNTER — Ambulatory Visit (INDEPENDENT_AMBULATORY_CARE_PROVIDER_SITE_OTHER): Payer: Medicare HMO | Admitting: Physician Assistant

## 2018-10-08 ENCOUNTER — Encounter: Payer: Self-pay | Admitting: Physician Assistant

## 2018-10-08 ENCOUNTER — Telehealth: Payer: Self-pay

## 2018-10-08 VITALS — BP 136/60 | HR 68 | Temp 97.9°F | Ht 70.5 in | Wt 217.1 lb

## 2018-10-08 DIAGNOSIS — Z1211 Encounter for screening for malignant neoplasm of colon: Secondary | ICD-10-CM

## 2018-10-08 DIAGNOSIS — K5732 Diverticulitis of large intestine without perforation or abscess without bleeding: Secondary | ICD-10-CM

## 2018-10-08 DIAGNOSIS — R1032 Left lower quadrant pain: Secondary | ICD-10-CM

## 2018-10-08 DIAGNOSIS — Z86010 Personal history of colonic polyps: Secondary | ICD-10-CM | POA: Diagnosis not present

## 2018-10-08 DIAGNOSIS — M459 Ankylosing spondylitis of unspecified sites in spine: Secondary | ICD-10-CM | POA: Diagnosis not present

## 2018-10-08 MED ORDER — NA SULFATE-K SULFATE-MG SULF 17.5-3.13-1.6 GM/177ML PO SOLN: ORAL | 0 refills | Status: DC

## 2018-10-08 MED ORDER — AMOXICILLIN-POT CLAVULANATE 875-125 MG PO TABS: 1.0000 | ORAL_TABLET | Freq: Two times a day (BID) | ORAL | 0 refills | Status: DC

## 2018-10-08 MED FILL — AMOX-CLAV 875-125 MG TABLET: 875-125 | 10 days supply | Qty: 20 | Fill #0

## 2018-10-08 NOTE — Telephone Encounter (Signed)
[facility] Pre-operative Risk Assessment     Request for surgical clearance:     Endoscopy Procedure  What type of surgery is being performed?     Colon  When is this surgery scheduled?     11/26/18  What type of clearance is required ?   Pharmacy  Are there any medications that need to be held prior to surgery and how long? HOLD ELIQUIS 2 DAYS PRIOR  Practice name and name of physician performing surgery?       Gastroenterology/Dr. Fuller Plan  What is your office phone and fax number?      Phone- [phone]  Fax- [phone]  Attn: Peter Congo, RMA  Anesthesia type (None, local, MAC, general) ?       MAC Dr. Percival Spanish please advise. Thanks, Peter Congo

## 2018-10-08 NOTE — Progress Notes (Signed)
Subjective:    Patient ID: [name] Woods, male    DOB: 12/10/1940, 78 y.o.   MRN: [id number]  HPI [name] Woods is a pleasant 78 year old white male, known to Dr. Fuller Plan who comes in today with complaints of left-sided abdominal pain. He was last seen in 2017 when he had endoscopy and colonoscopy.  EGD showed a small hiatal hernia, mild Schatzki's ring and a 5 mm polyp in the gastric body,also noted to have a periampullary diverticulum. Colonoscopy showed multiple sigmoid diverticuli, and 6 sessile polyps were removed all 6 to 8 mm in size and path consistent with tubular adenomas.  He is due for interval follow-up. Patient has several chronic medical problems including hypertension, atrial fibrillation for which he is on Eliquis, coronary artery disease, sleep apnea, and ankylosing spondylitis for which she is on chronic pain meds. He says he has been noticing left-sided abdominal pain over the past 6 to 8 weeks which seems to be worse at night.  He thinks the pain is been fairly constant and radiate some towards his left flank.  He has not noticed any positional changes, no recent injuries etc.  He has not noticed any change with p.o. intake or with bowel movements.  No associated fever or chills.  Bowel movements have been fairly normal and no melena or hematochezia.  Review of Systems Pertinent positive and negative review of systems were noted in the above HPI section.  All other review of systems was otherwise negative.  Outpatient Encounter Medications as of 10/08/2018  Medication Sig  . allopurinol (ZYLOPRIM) 100 MG tablet Take 1 tablet (100 mg total) by mouth daily.  Marland Kitchen apixaban (ELIQUIS) 5 MG TABS tablet Take 1 tablet (5 mg total) by mouth 2 (two) times daily.  Marland Kitchen atorvastatin (LIPITOR) 10 MG tablet TAKE 1 TABLET EVERY DAY  . cyclobenzaprine (FLEXERIL) 10 MG tablet Take 1 tablet (10 mg total) by mouth 3 (three) times daily as needed for muscle spasms.  Marland Kitchen diltiazem (CARDIZEM CD) 240 MG 24 hr  capsule TAKE 1 CAPSULE EVERY DAY (DUE FOR APPOINTMENT FOR FUTURE REFILLS)  . furosemide (LASIX) 20 MG tablet TAKE 1 TABLET EVERY DAY  . HYDROcodone-acetaminophen (NORCO) 10-325 MG tablet TAKE 1 TABLET BY MOUTH EVERY 6 HOURS AS NEEDED.  Marland Kitchen Loperamide HCl (IMODIUM PO) Take by mouth as needed. Reported on 05/10/2015  . metoprolol succinate (TOPROL-XL) 100 MG 24 hr tablet TAKE 1 TABLET  TWO TIMES DAILY  WITH OR IMMEDIATELY FOLLOWING A MEAL.  . sildenafil (REVATIO) 20 MG tablet TAKE 1 TABLET BY MOUTH AS NEEDED FOR ERECTILE DYSFUNCTION. DO NO TAKE MORE THAN 1 DOSE IN 36 HOURS.  Marland Kitchen amoxicillin-clavulanate (AUGMENTIN) 875-125 MG tablet Take 1 tablet by mouth 2 (two) times daily.  . Na Sulfate-K Sulfate-Mg Sulf 17.5-3.13-1.6 GM/177ML SOLN Suprep-Use as directed  . [DISCONTINUED] escitalopram (LEXAPRO) 10 MG tablet Take 1 tablet (10 mg total) by mouth daily.  . [DISCONTINUED] omeprazole (PRILOSEC) 40 MG capsule Take 1 capsule (40 mg total) by mouth daily.   No facility-administered encounter medications on file as of 10/08/2018.    Allergies  Allergen Reactions  . Coumadin [Warfarin] Other (See Comments)    GI bleeds   Patient Active Problem List   Diagnosis Date Noted  . Hyperlipidemia 02/06/2018  . Encounter for chronic pain management 03/01/2017  . Abnormal stress test 05/10/2016  . Elevated coronary artery calcium score 05/10/2016  . Hyperglycemia 04/23/2016  . Anxiety and depression 09/12/2015  . History of colonic polyps 03/02/2015  .  Chronic anticoagulation 03/02/2015  . Multiple lung nodules 10/22/2014  . Lung nodule, multiple 08/24/2014  . Emphysema of lung ([place]) 07/28/2014  . Gout 06/30/2014  . Paroxysmal atrial fibrillation ([place]) 03/21/2012  . CAD (coronary artery disease) 03/21/2012  . GERD (gastroesophageal reflux disease) 03/21/2012  . COLONIC POLYPS, ADENOMATOUS 04/25/2007  . Essential hypertension 04/25/2007  . VENTRICULAR HYPERTROPHY, LEFT 04/25/2007  . HIATAL HERNIA  04/25/2007  . ARTHRITIS 04/25/2007  . SLEEP APNEA 04/25/2007  . CHOLELITHIASIS, HX OF 04/25/2007  . NEPHROLITHIASIS, HX OF 04/25/2007  . Ankylosing spondylitis ([place]) 09/04/2006   Social History   Socioeconomic History  . Marital status: Married    Spouse name: Not on file  . Number of children: 4  . Years of education: Not on file  . Highest education level: Not on file  Occupational History  . 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 Financial resource strain: Not on file  . Food insecurity    Worry: Not on file    Inability: Not on file  . Transportation needs    Medical: Not on file    Non-medical: Not on file  Tobacco Use  . Smoking status: Never Smoker  . Smokeless tobacco: Never Used  Substance and Sexual Activity  . Alcohol use: No  . Drug use: No  . Sexual activity: Not on file  Lifestyle  . Physical activity    Days per week: Not on file    Minutes per session: Not on file  . Stress: Not on file  Relationships  . Social Herbalist on phone: Not on file    Gets together: Not on file    Attends religious service: Not on file    Active member of club or organization: Not on file    Attends meetings of clubs or organizations: Not on file    Relationship status: Not on file  . Intimate partner violence    Fear of current or ex partner: Not on file    Emotionally abused: Not on file    Physically abused: Not on file    Forced sexual activity: Not on file  Other Topics Concern  . Not on file  Social History Narrative   4 caffeine drinks daily     [name] Woods family history includes Arthritis/Rheumatoid in his mother; Cancer in his paternal uncle; Dementia in his paternal grandmother; Healthy in his brother, daughter, and son; Heart attack (age of onset: 74) in his father; Heart disease (age of onset: 33) in his father; Heart disease (age of onset: 70) in his brother; Hyperlipidemia in his brother;  Other in his brother, sister, and sister; Stroke (age of onset: 84) in his brother.      Objective:    Vitals:   10/08/18 1416  BP: 136/60  Pulse: 68  Temp: 97.9 F (36.6 C)    Physical Exam;Well-developed well-nourished  Elderly male  in no acute distress. Weight,217 BMI 30.7  HEENT; nontraumatic normocephalic, EOMI, PER RR LA, sclera anicteric. Oropharynx; not examined/wearing mask/COVID Neck; supple, no JVD Cardiovascular; regular rate and rhythm with S1-S2, no murmur rub or gallop Pulmonary; Clear bilaterally Abdomen; soft, tender in the left mid quadrant, no guarding or rebound no palpable mass, nondistended, no  hepatosplenomegaly, bowel sounds are active Rectal; not done today Skin; benign exam, no jaundice rash or appreciable lesions Extremities; no clubbing cyanosis or edema skin warm and dry Neuro/Psych; alert and  oriented x4, grossly nonfocal mood and affect appropriate       Assessment & Plan:   #28 78 year old male with 6 to 8-week history of persistent left mid quadrant abdominal pain.  Patient has known sigmoid diverticulosis.  Probable smoldering diverticulitis.  #2 history of multiple adenomatous colon polyps-last colonoscopy April 2017 and overdue for 3-year interval follow-up 3.  Chronic GERD 4.  Coronary artery disease 5.  Sleep apnea 6.  Atrial fibrillation 7.  Chronic anticoagulation on Eliquis 8.  Hypertension 9.  Ankylosing spondylitis  Plan; will start Augmentin 875 mg p.o. twice daily x10 days.  Patient is asked to call back when he finishes antibiotics, if he has not had resolution of left mid quadrant pain he will need imaging with CT of the abdomen and pelvis.  As he is overdue for Colonoscopy will go ahead and schedule Colonoscopy with Dr. Fuller Plan, but intentionally schedule out into October to allow time for resolution of probable mild diverticulitis and/or further imaging..  Procedure was discussed in detail with patient including  indications risks and benefits and he is agreeable to proceed. Patient will need to hold Eliquis for 48 hours prior to colonoscopy, we will communicate with Dr. Hochrein/cardiology to assure this is reasonable for this patient.   Ramy Greth Genia Harold PA-C 10/08/2018   Cc: Brunetta Jeans, PA-C

## 2018-10-08 NOTE — Patient Instructions (Signed)
If you are age 77 or older, your body mass index should be between 23-30. Your Body mass index is 30.71 kg/m. If this is out of the aforementioned range listed, please consider follow up with your Primary Care Provider.  If you are age 37 or younger, your body mass index should be between 19-25. Your Body mass index is 30.71 kg/m. If this is out of the aformentioned range listed, please consider follow up with your Primary Care Provider.   You have been scheduled for a colonoscopy. Please follow written instructions given to you at your visit today.  Please pick up your prep supplies at the pharmacy within the next 1-3 days. If you use inhalers (even only as needed), please bring them with you on the day of your procedure. Your physician has requested that you go to www.startemmi.com and enter the access code given to you at your visit today. This web site gives a general overview about your procedure. However, you should still follow specific instructions given to you by our office regarding your preparation for the procedure.  We have sent the following medications to your pharmacy for you to pick up at your convenience: Suprep Augmentin   You will be contacted by our office prior to your procedure for directions on holding your Eliquis.  If you do not hear from our office 1 week prior to your scheduled procedure, please call [phone] to discuss.   Call back when you have finished antibiotic.  Speak to Dr. Lynne Leader nurse with an update.  If not better, will need a CT scan and possibly delay colonoscopy.  Thank you for choosing me and [facility].   Amy Esterwood, PA-C

## 2018-10-09 NOTE — Telephone Encounter (Signed)
   Primary Cardiologist: Minus Breeding, MD  Chart reviewed as part of pre-operative protocol coverage. Per pharmacy recommendations, patient can hold eliquis 2 days prior to his upcoming colonoscopy. He should restart eliquis as soon as he is cleared to do so by his gastroenterologist.   I will route this recommendation to the requesting party via [facility] fax function and remove from pre-op pool.  Please call with questions.  Abigail Butts, PA-C 10/09/2018, 3:32 PM

## 2018-10-09 NOTE — Telephone Encounter (Signed)
Patient with diagnosis of afib on Eliquis for anticoagulation.    Procedure: Endoscopy Procedure Date of procedure: 11/26/2018  CHADS2-VASc score of  4 (HTN, AGE, CAD, AGE)  Per office protocol, patient can hold Eliquis for 2 days prior to procedure.

## 2018-10-09 NOTE — Telephone Encounter (Signed)
Left message on patients  Voicemail to return my call to discuss holding Eliquis 2 days prior to his procedure.

## 2018-10-10 NOTE — Progress Notes (Signed)
Reviewed and agree with management plan.  Lya Holben T. Dacari Beckstrand, MD FACG [facility]  

## 2018-10-13 NOTE — Telephone Encounter (Signed)
Spoke with patient today regarding holding his Eliquis two days prior to procedure.  Patient verbalized understanding.

## 2018-10-15 ENCOUNTER — Telehealth: Payer: Self-pay | Admitting: Physician Assistant

## 2018-10-15 ENCOUNTER — Other Ambulatory Visit: Payer: Self-pay | Admitting: Physician Assistant

## 2018-10-15 MED FILL — ELIQUIS 5 MG TABLET: 5 | 7 days supply | Qty: 14 | Fill #6

## 2018-10-15 NOTE — Telephone Encounter (Signed)
Last OV 07/16/18 Hydrocodone last filled 09/16/18 #75 with 0

## 2018-10-15 NOTE — Telephone Encounter (Signed)
Called patient back. No answer. Got the voicemail. Left a message to call back.

## 2018-10-16 MED FILL — HYDROCODON-APAP 10-325: 10-325 | 19 days supply | Qty: 75 | Fill #0

## 2018-10-20 ENCOUNTER — Other Ambulatory Visit: Payer: Self-pay

## 2018-10-20 DIAGNOSIS — R1032 Left lower quadrant pain: Secondary | ICD-10-CM

## 2018-10-20 NOTE — Telephone Encounter (Signed)
Spoke with the patient. He has taken the antibiotics. Still has discomfort in his left side. Cannot lie on the left comfortably. He has areas of tenderness on the left. Afebrile. Bowel movement "smells bad." He wants to proceed with the CT. Colonoscopy with Dr Fuller Plan is in October on the 28th.

## 2018-10-20 NOTE — Telephone Encounter (Signed)
Yes - please schedule for Ct of abd/pelvis with contrast -persistent  Left abd pain , hx diverticulitis

## 2018-10-20 NOTE — Telephone Encounter (Signed)
Patient is scheduled at [place] 10/23/18 at [facility]. He will pick up his contrast and instructions here.

## 2018-10-22 ENCOUNTER — Other Ambulatory Visit: Payer: Self-pay

## 2018-10-22 DIAGNOSIS — R1032 Left lower quadrant pain: Secondary | ICD-10-CM

## 2018-10-23 ENCOUNTER — Other Ambulatory Visit (INDEPENDENT_AMBULATORY_CARE_PROVIDER_SITE_OTHER): Payer: Medicare HMO

## 2018-10-23 ENCOUNTER — Ambulatory Visit (INDEPENDENT_AMBULATORY_CARE_PROVIDER_SITE_OTHER)
Admission: RE | Admit: 2018-10-23 | Discharge: 2018-10-23 | Disposition: A | Discharge status: Home / Self Care | Payer: Medicare HMO | Source: Ambulatory Visit | Attending: Physician Assistant | Admitting: Physician Assistant

## 2018-10-23 ENCOUNTER — Other Ambulatory Visit: Payer: Self-pay

## 2018-10-23 DIAGNOSIS — R1032 Left lower quadrant pain: Secondary | ICD-10-CM

## 2018-10-23 DIAGNOSIS — N200 Calculus of kidney: Secondary | ICD-10-CM | POA: Diagnosis not present

## 2018-10-23 LAB — BASIC METABOLIC PANEL
BUN: 14 mg/dL (ref 6–23)
CO2: 29 mEq/L (ref 19–32)
Calcium: 9.6 mg/dL (ref 8.4–10.5)
Chloride: 104 mEq/L (ref 96–112)
Creatinine, Ser: 1.19 mg/dL (ref 0.40–1.50)
GFR: 59.17 mL/min — ABNORMAL LOW (ref >60.00)
Glucose, Bld: 123 mg/dL — ABNORMAL HIGH (ref 70–99)
Potassium: 3.8 mEq/L (ref 3.5–5.1)
Sodium: 143 mEq/L (ref 135–145)

## 2018-10-23 MED ORDER — IOHEXOL 300 MG/ML  SOLN
100.0000 mL | Freq: Once | INTRAMUSCULAR | Status: AC | PRN
  Administered 2018-10-23: 14:00:00 100 mL via INTRAVENOUS

## 2018-10-28 ENCOUNTER — Telehealth: Payer: Self-pay | Admitting: Gastroenterology

## 2018-10-28 NOTE — Telephone Encounter (Signed)
Pt inquired about CT results.  

## 2018-10-28 NOTE — Telephone Encounter (Signed)
Pt calling for CT results. Please advise. 

## 2018-10-29 ENCOUNTER — Telehealth: Payer: Self-pay | Admitting: Cardiology

## 2018-10-29 MED FILL — ELIQUIS 5 MG TABLET: 5 | 7 days supply | Qty: 14 | Fill #7

## 2018-10-29 NOTE — Telephone Encounter (Signed)
° ° °  Patient calling the office for samples of medication:    1.  What medication and dosage are you requesting samples for? ELIQUIS  2.  Are you currently out of this medication? YES   

## 2018-10-30 ENCOUNTER — Encounter: Payer: Self-pay | Admitting: Cardiology

## 2018-10-30 NOTE — Telephone Encounter (Signed)
error 

## 2018-10-30 NOTE — Telephone Encounter (Signed)
Patient calling again to check on status.

## 2018-10-30 NOTE — Telephone Encounter (Signed)
Left message to call back  

## 2018-11-04 NOTE — Telephone Encounter (Signed)
LM2CB-DOES PT NEED SAMPLES?

## 2018-11-07 NOTE — Telephone Encounter (Signed)
° °  Patient calling the office for samples of medication:   1.  What medication and dosage are you requesting samples for? ELIQUIS  2.  Are you currently out of this medication? no

## 2018-11-13 MED FILL — ELIQUIS 5 MG TABLET: 5 | 7 days supply | Qty: 14 | Fill #8

## 2018-11-13 NOTE — Telephone Encounter (Signed)
Left message for patient, no samples available at this time. 

## 2018-11-17 ENCOUNTER — Other Ambulatory Visit: Payer: Self-pay

## 2018-11-17 ENCOUNTER — Encounter: Payer: Self-pay | Admitting: Physician Assistant

## 2018-11-17 ENCOUNTER — Other Ambulatory Visit: Payer: Self-pay | Admitting: Family Medicine

## 2018-11-17 ENCOUNTER — Ambulatory Visit (INDEPENDENT_AMBULATORY_CARE_PROVIDER_SITE_OTHER): Payer: Medicare HMO | Admitting: Physician Assistant

## 2018-11-17 VITALS — BP 120/60 | HR 43 | Temp 98.1°F | Resp 16 | Ht 70.5 in | Wt 216.0 lb

## 2018-11-17 DIAGNOSIS — G8929 Other chronic pain: Secondary | ICD-10-CM

## 2018-11-17 DIAGNOSIS — Z23 Encounter for immunization: Secondary | ICD-10-CM | POA: Diagnosis not present

## 2018-11-17 DIAGNOSIS — M79609 Pain in unspecified limb: Secondary | ICD-10-CM | POA: Diagnosis not present

## 2018-11-17 MED ORDER — OXYCODONE-ACETAMINOPHEN 10-325 MG PO TABS: 1.0000 | ORAL_TABLET | Freq: Four times a day (QID) | ORAL | 0 refills | Status: AC | PRN

## 2018-11-17 MED FILL — OXYCODONE-ACETAMINOPHEN 10-: 10-325 | 3 days supply | Qty: 10 | Fill #0

## 2018-11-17 NOTE — Patient Instructions (Signed)
Please elevate the leg while resting. You can continue the compression sleeve daily. Consider some over-the-counter Voltaren gel to the knee. Start the new pain medication for the next couple of days to help with acute pain until you see the specialist.  We will refill the regular medication for you once we know what the specialist plans to do.   You have an appointment with Dr. Junius Roads on Wednesday, 11/19/18 at 9:20 AM -- Please arrive by 9:00 AM.

## 2018-11-17 NOTE — Telephone Encounter (Signed)
Indication for chronic opioid: OA of knees, multiple sites Medication and dose: Norco 10-325 mg # pills per month: 75 Last UDS date: 03/07/2018 Opioid Treatment Agreement signed (Y/N): Yes, 07/27/2017 Opioid Treatment Agreement last reviewed with patient:   [facility] reviewed this encounter (include red flags):

## 2018-11-17 NOTE — Progress Notes (Signed)
Reviewed and updated today.  Increase in right knee pain over the last 9-10 days, no injury or fall. Pain is mainly posterior and aching and pulling in nature. Pain is 10/10 with walking. Denies numbness, tingling, weakness. Questions mild bruising in the popliteal region. 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 Followed by Ortho but has not seen them since January of this year.   Indication for chronic opioid: Ankylosing Spondylitis, OA multiple sites, ES OA R knee Medication and dose: Hydrocodone 10-325 mg  # pills per month: 75 Last UDS date: 03/2018 Pain contract signed (date): 03/2018 Date narcotic database last reviewed (include red flags): Today. No red flags  Pain Inventory (1-10 worse): Average Pain 10 Pain Right Now 7 My pain is constant, occasionally sharp (character i.e. sharp, stabbing, dull, constant etc)  Pain is worse with: walking, weightbearing, knee extension  Relief from Meds: relief lasts ~1-1.5 hours  In the last 24 hours, has pain interfered with the following (1-10 greatest interference) ? General activity: 10 Relation with others: 1 Enjoyment of life: 10 What TIME of day is your pain at its worst? constant        Sleep (in general) -- interferes with sleep on occasion  Mobility/Function: Assistance device: n/a How many minutes can you walk? 10-15 Ability to climb steps? Yes- one step at a time Do you drive? yes Disabled (date) Neuro/Psych Sx: (bladder, bowel, weakness, dizziness, depression etc): none  Prior Studies: See EMR  -- MRI R knee 01/2018  Physicians involved in your care: Any changes since last visit?  no  Past Medical History:  Diagnosis Date  . Ankylosing spondylitis ([place])   . Arthritis   . CAD (coronary artery disease) [id number]   30% mid LAD lesion on cardiac cath  . Chicken pox   . Cholelithiasis   . Gallstones   . GERD (gastroesophageal reflux disease)   .  [place] measles   . Hiatal hernia   . HLD (hyperlipidemia)   . HTN (hypertension)   . Hypertension   . Nephrolithiasis   . Persistent atrial fibrillation ([place])   . Sleep apnea    No CPAP  . 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 Ventricular hypertrophy     Current Outpatient Medications on File Prior to Visit  Medication Sig Dispense Refill  . allopurinol (ZYLOPRIM) 100 MG tablet Take 1 tablet (100 mg total) by mouth daily. 90 tablet 1  . amoxicillin-clavulanate (AUGMENTIN) 875-125 MG tablet Take 1 tablet by mouth 2 (two) times daily. 20 tablet 0  . apixaban (ELIQUIS) 5 MG TABS tablet Take 1 tablet (5 mg total) by mouth 2 (two) times daily. 30 tablet 0  . atorvastatin (LIPITOR) 10 MG tablet TAKE 1 TABLET EVERY DAY 90 tablet 1  . cyclobenzaprine (FLEXERIL) 10 MG tablet Take 1 tablet (10 mg total) by mouth 3 (three) times daily as needed for muscle spasms. 15 tablet 0  . diltiazem (CARDIZEM CD) 240 MG 24 hr capsule TAKE 1 CAPSULE EVERY DAY (DUE FOR APPOINTMENT FOR FUTURE REFILLS) 90 capsule 1  . furosemide (LASIX) 20 MG tablet TAKE 1 TABLET EVERY DAY 90 tablet 0  . HYDROcodone-acetaminophen (NORCO) 10-325 MG tablet TAKE 1 TABLET BY MOUTH EVERY 6 HOURS AS NEEDED 75 tablet 0  . Loperamide HCl (IMODIUM PO) Take by mouth as needed. Reported on 05/10/2015    . metoprolol succinate (TOPROL-XL) 100 MG 24 hr tablet TAKE 1 TABLET  TWO TIMES DAILY  WITH OR IMMEDIATELY FOLLOWING A MEAL. 180 tablet 1  . Na Sulfate-K Sulfate-Mg Sulf 17.5-3.13-1.6 GM/177ML SOLN Suprep-Use as directed 354 mL 0  . sildenafil (REVATIO) 20 MG tablet TAKE 1 TABLET BY MOUTH AS NEEDED FOR ERECTILE DYSFUNCTION. DO NO TAKE MORE THAN 1 DOSE IN 36 HOURS. 30 tablet 0   No current facility-administered medications on file prior to visit.     Allergies  Allergen Reactions  . Coumadin [Warfarin] Other (See Comments)    GI bleeds    Family History  Problem Relation Age of Onset  . Heart disease Father 82       Deceased  . Heart  attack Father 6  . Heart disease Brother 5       "Died in his sleep"  . Arthritis/Rheumatoid Mother        Deceased-86  . Hyperlipidemia Brother   . Stroke Brother 34       Deceased  . Other Sister        Estate agent in [place]  . Other Sister        MVA  . Other Brother        Carjacked-killed  . Dementia Paternal Grandmother   . Cancer Paternal Uncle   . Healthy Brother   . Healthy Son        X3  . Healthy Daughter        x1  . Colon cancer Neg Hx     Social History   Socioeconomic History  . Marital status: Married    Spouse name: Not on file  . Number of children: 4  . Years of education: Not on file  . Highest education level: Not on file  Occupational History  . 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 Financial resource strain: Not on file  . Food insecurity    Worry: Not on file    Inability: Not on file  . Transportation needs    Medical: Not on file    Non-medical: Not on file  Tobacco Use  . Smoking status: Never Smoker  . Smokeless tobacco: Never Used  Substance and Sexual Activity  . Alcohol use: No  . Drug use: No  . Sexual activity: Not on file  Lifestyle  . Physical activity    Days per week: Not on file    Minutes per session: Not on file  . Stress: Not on file  Relationships  . Social Herbalist on phone: Not on file    Gets together: Not on file    Attends religious service: Not on file    Active member of club or organization: Not on file    Attends meetings of clubs or organizations: Not on file    Relationship status: Not on file  Other Topics Concern  . Not on file  Social History Narrative   4 caffeine drinks daily    Review of Systems - See HPI.  All other ROS are negative.  BP 120/60   Pulse (!) 43   Temp 98.1 F (36.7 C) (Temporal)   Resp 16   Ht 5' 10.5" (1.791 m)   Wt 216 lb (98 kg)   SpO2 97%   BMI 30.55 kg/m   Physical Exam Vitals signs reviewed.   Constitutional:      Appearance: Normal appearance.  HENT:     Head: Normocephalic and atraumatic.  Right Ear: Tympanic membrane normal.     Left Ear: Tympanic membrane normal.  Neck:     Musculoskeletal: Neck supple.  Pulmonary:     Effort: Pulmonary effort is normal.  Abdominal:     Palpations: Abdomen is soft.  Musculoskeletal:     Right knee: He exhibits decreased range of motion (very slight limitation of full extension due to pulling in popliteal region. No decreased stregngth. No joint line tenderness).     Right lower leg: He exhibits swelling (mild trace edema -- chronic -- bilateral). He exhibits no tenderness and no bony tenderness.  Neurological:     General: No focal deficit present.     Mental Status: He is alert and oriented to person, place, and time.  Psychiatric:        Mood and Affect: Mood normal.     Recent Results (from the past 2160 hour(s))  Novel Coronavirus, NAA ([facility])     Status: None   Collection Time: 09/25/18 12:00 AM   Specimen: Oropharyngeal(OP) collection in vial transport medium   OROPHARYNGEA  TESTING  Result Value Ref Range   SARS-CoV-2, NAA Not Detected Not Detected    Comment: This test was developed and its performance characteristics determined by [employer]. This test has not been FDA cleared or approved. This test has been authorized by FDA under an Emergency Use Authorization (EUA). This test is only authorized for the duration of time the declaration that circumstances exist justifying the authorization of the emergency use of in vitro diagnostic tests for detection of SARS-CoV-2 virus and/or diagnosis of COVID-19 infection under section 564(b)(1) of the Act, 21 U.S.C. KA:123727), unless the authorization is terminated or revoked sooner. When diagnostic testing is negative, the possibility of a false negative result should be considered in the context of a patient's recent exposures and the presence of clinical  signs and symptoms consistent with COVID-19. An individual without symptoms of COVID-19 and who is not shedding SARS-CoV-2 virus would expect to have a negative (not detected) result in this assay.   Basic metabolic panel     Status: Abnormal   Collection Time: 10/23/18  9:28 AM  Result Value Ref Range   Sodium 143 135 - 145 mEq/L   Potassium 3.8 3.5 - 5.1 mEq/L   Chloride 104 96 - 112 mEq/L   CO2 29 19 - 32 mEq/L   Glucose, Bld 123 (H) 70 - 99 mg/dL   BUN 14 6 - 23 mg/dL   Creatinine, Ser 1.19 0.40 - 1.50 mg/dL   Calcium 9.6 8.4 - 10.5 mg/dL   GFR 59.17 (L) >60.00 mL/min    Assessment/Plan: 1. Encounter for chronic pain management Indication for chronic opioid: Ankylosing Spondylitis, OA multiple sites, ES OA R knee Medication and dose: Hydrocodone 10-325 mg  # pills per month: 75 Last UDS date: 03/2018 Pain contract signed (date): 03/2018 Date narcotic database last reviewed (include red flags): Today. No red flags  Will increase to Oxycodone 10-325 for a few days due to acute flare in knee pain until he sees Ortho. Will then resume his normal monthly prescription.  2. Popliteal pain Question worsening of his Baker's cyst noted on prior imaging. Will increase meds to Percocet 10-325 mg. Ice and elevate. Topical Voltaren OTC to be considered. Have patient scheduled with his Ortho on Wednesday morning.   3. Need for immunization against influenza - Flu Vaccine QUAD High Dose(Fluad)   Leeanne Rio, PA-C

## 2018-11-19 ENCOUNTER — Ambulatory Visit (INDEPENDENT_AMBULATORY_CARE_PROVIDER_SITE_OTHER): Payer: Medicare HMO | Admitting: Family Medicine

## 2018-11-19 ENCOUNTER — Ambulatory Visit: Payer: Self-pay

## 2018-11-19 ENCOUNTER — Encounter: Payer: Self-pay | Admitting: Family Medicine

## 2018-11-19 ENCOUNTER — Other Ambulatory Visit: Payer: Self-pay

## 2018-11-19 DIAGNOSIS — M25561 Pain in right knee: Secondary | ICD-10-CM

## 2018-11-19 DIAGNOSIS — G8929 Other chronic pain: Secondary | ICD-10-CM | POA: Diagnosis not present

## 2018-11-19 DIAGNOSIS — M7121 Synovial cyst of popliteal space [Baker], right knee: Secondary | ICD-10-CM

## 2018-11-19 NOTE — Progress Notes (Signed)
Office Visit Note   Patient: [name] Woods           Date of Birth: November 15, 1940           MRN: [id number] Visit Date: 11/19/2018 Requested by: Brunetta Jeans, PA-C [address] [place],  [place] [address] PCP: Delorse Limber  Subjective: Chief Complaint  Patient presents with  . Right Knee - Pain    Pain in the posterior aspect of the knee, mainly. Wearing a sleeve - helps some. Swells off & on. Hurts with walking.    HPI: He is here with recurrent right posterior knee pain.  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  After that, he was not able to follow-up due to COVID-19.  But somehow his pain improved and he was feeling much better until recently when he started noticing increasing pain on the posterior knee again.  It bothers him mainly to walk.  Denies any numbness or tingling in his leg, no cramping.  He cannot take NSAIDs due to blood thinners.              ROS:   All other systems were reviewed and are negative.  Objective: Vital Signs: There were no vitals taken for this visit.  Physical Exam:  General:  Alert and oriented, in no acute distress. Pulm:  Breathing unlabored. Psy:  Normal mood, congruent affect. Skin: No rash or bruising. Right knee: No significant intra-articular effusion today.  He is tender to palpation posteriorly over a large loose body.  No significant joint line tenderness.  Imaging: None today other than for ultrasound guidance  Assessment & Plan: 1.  Recurrent right posterior knee pain due to large loose body -Discussed options with him, he wants to try an ultrasound guided injection into the Baker's cyst where the large loose body is located.  He will follow-up as needed.  We can repeat this in the future if necessary.     Procedures: Right knee Baker's cyst injection: After sterile prep with Betadine, injected 5  cc 1% lidocaine without epinephrine and 40 mg methylprednisolone into the posterior knee at the area of maximum tenderness over the large loose body.  Injectate was seen filling the cyst.  He had excellent immediate relief.    PMFS History: Patient Active Problem List   Diagnosis Date Noted  . Hyperlipidemia 02/06/2018  . Encounter for chronic pain management 03/01/2017  . Abnormal stress test 05/10/2016  . Elevated coronary artery calcium score 05/10/2016  . Hyperglycemia 04/23/2016  . Anxiety and depression 09/12/2015  . History of colonic polyps 03/02/2015  . Chronic anticoagulation 03/02/2015  . Multiple lung nodules 10/22/2014  . Lung nodule, multiple 08/24/2014  . Emphysema of lung ([place]) 07/28/2014  . Gout 06/30/2014  . Paroxysmal atrial fibrillation ([place]) 03/21/2012  . CAD (coronary artery disease) 03/21/2012  . GERD (gastroesophageal reflux disease) 03/21/2012  . COLONIC POLYPS, ADENOMATOUS 04/25/2007  . Essential hypertension 04/25/2007  . VENTRICULAR HYPERTROPHY, LEFT 04/25/2007  . HIATAL HERNIA 04/25/2007  . ARTHRITIS 04/25/2007  . SLEEP APNEA 04/25/2007  . CHOLELITHIASIS, HX OF 04/25/2007  . NEPHROLITHIASIS, HX OF 04/25/2007  . Ankylosing spondylitis ([place]) 09/04/2006   Past Medical History:  Diagnosis Date  . Ankylosing spondylitis ([place])   . Arthritis   . CAD (coronary artery disease) [id number]   30% mid LAD lesion on cardiac cath  .  Chicken pox   . Cholelithiasis   . Gallstones   . GERD (gastroesophageal reflux disease)   . [place] measles   . Hiatal hernia   . HLD (hyperlipidemia)   . HTN (hypertension)   . Hypertension   . Nephrolithiasis   . Persistent atrial fibrillation ([place])   . Sleep apnea    No CPAP  . 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 Ventricular hypertrophy     Family History  Problem Relation Age of Onset  . Heart disease Father 21       Deceased  . Heart attack Father 37  . Heart disease Brother 11       "Died in his sleep"  .  Arthritis/Rheumatoid Mother        Deceased-86  . Hyperlipidemia Brother   . Stroke Brother 43       Deceased  . Other Sister        Estate agent in [place]  . Other Sister        MVA  . Other Brother        Carjacked-killed  . Dementia Paternal Grandmother   . Cancer Paternal Uncle   . Healthy Brother   . Healthy Son        X3  . Healthy Daughter        x1  . Colon cancer Neg Hx     Past Surgical History:  Procedure Laterality Date  . CARDIAC CATHETERIZATION  03/2012   30% mid LAD lesion otherwise normal cors, LVEF 65%  . CHOLECYSTECTOMY    . ELBOW ARTHROPLASTY Left   . HAND SURGERY Left   . LEFT AND RIGHT HEART CATHETERIZATION WITH CORONARY ANGIOGRAM N/A 03/24/2012   Procedure: LEFT AND RIGHT HEART CATHETERIZATION WITH CORONARY ANGIOGRAM;  Surgeon: Sherren Mocha, MD;  Location: [facility] CATH LAB;  Service: Cardiovascular;  Laterality: N/A;  . LEG FLUID REMOVAL RIGHT    . TONSILLECTOMY     Social History   Occupational History  . 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 Smoking status: Never Smoker  . Smokeless tobacco: Never Used  Substance and Sexual Activity  . Alcohol use: No  . Drug use: No  . Sexual activity: Not on file

## 2018-11-20 ENCOUNTER — Other Ambulatory Visit: Payer: Self-pay | Admitting: Emergency Medicine

## 2018-11-20 ENCOUNTER — Telehealth: Payer: Self-pay | Admitting: Cardiology

## 2018-11-20 MED ORDER — HYDROCODONE-ACETAMINOPHEN 10-325 MG PO TABS: 1.0000 | ORAL_TABLET | Freq: Four times a day (QID) | ORAL | 0 refills | Status: DC | PRN

## 2018-11-20 NOTE — Telephone Encounter (Signed)
New message ° ° °Patient calling the office for samples of medication: ° ° °1.  What medication and dosage are you requesting samples for?apixaban (ELIQUIS) 5 MG TABS tablet ° °2.  Are you currently out of this medication? No  ° ° °

## 2018-11-20 NOTE — Telephone Encounter (Signed)
No samples available Left message to call back

## 2018-11-20 NOTE — Telephone Encounter (Signed)
Patient calling for refill of this Norco 10-325 mg Patient was recently in the office on 11/17/18 He was given Percocet 10-325 mg for his knee pain until his appointment with specialist.  Please advise

## 2018-11-21 MED FILL — HYDROCODON-APAP 10-325: 10-325 | 19 days supply | Qty: 75 | Fill #0

## 2018-11-24 ENCOUNTER — Encounter: Payer: Self-pay | Admitting: Gastroenterology

## 2018-11-24 ENCOUNTER — Telehealth: Payer: Self-pay | Admitting: Physician Assistant

## 2018-11-24 ENCOUNTER — Other Ambulatory Visit: Payer: Self-pay

## 2018-11-24 ENCOUNTER — Telehealth: Payer: Self-pay

## 2018-11-24 MED FILL — SUPREP BOWEL PREP KIT: 17.5-3.13-1 | 1 days supply | Qty: 354 | Fill #0

## 2018-11-24 NOTE — Telephone Encounter (Signed)
Adrienne from [facility] called asking if suprep can be changed to trilyte because pt cannot afford copay for suprep.

## 2018-11-24 NOTE — Telephone Encounter (Signed)
New instructions given to patient with Plenvu sample. Patient is picking up today.

## 2018-11-25 ENCOUNTER — Telehealth: Payer: Self-pay

## 2018-11-25 NOTE — Telephone Encounter (Signed)
Let pt know we have Eliquis samples. Ready at the desk for pt.

## 2018-11-25 NOTE — Telephone Encounter (Signed)
Covid-19 screening questions   Do you now or have you had a fever in the last 14 days? NO   Do you have any respiratory symptoms of shortness of breath or cough now or in the last 14 days? NO  Do you have any family members or close contacts with diagnosed or suspected Covid-19 in the past 14 days? NO  Have you been tested for Covid-19 and found to be positive? NO        

## 2018-11-26 ENCOUNTER — Encounter: Payer: Self-pay | Admitting: Gastroenterology

## 2018-11-26 ENCOUNTER — Ambulatory Visit (AMBULATORY_SURGERY_CENTER): Payer: Medicare HMO | Admitting: Gastroenterology

## 2018-11-26 ENCOUNTER — Other Ambulatory Visit: Payer: Self-pay | Admitting: Gastroenterology

## 2018-11-26 ENCOUNTER — Other Ambulatory Visit: Payer: Self-pay

## 2018-11-26 VITALS — BP 118/63 | HR 60 | Temp 97.6°F | Resp 21 | Ht 70.5 in | Wt 216.0 lb

## 2018-11-26 DIAGNOSIS — D120 Benign neoplasm of cecum: Secondary | ICD-10-CM

## 2018-11-26 DIAGNOSIS — D122 Benign neoplasm of ascending colon: Secondary | ICD-10-CM

## 2018-11-26 DIAGNOSIS — Z860100 Personal history of colon polyps, unspecified: Secondary | ICD-10-CM

## 2018-11-26 DIAGNOSIS — K5790 Diverticulosis of intestine, part unspecified, without perforation or abscess without bleeding: Secondary | ICD-10-CM

## 2018-11-26 DIAGNOSIS — D124 Benign neoplasm of descending colon: Secondary | ICD-10-CM | POA: Diagnosis not present

## 2018-11-26 DIAGNOSIS — R1032 Left lower quadrant pain: Secondary | ICD-10-CM | POA: Diagnosis not present

## 2018-11-26 HISTORY — DX: Diverticulosis of intestine, part unspecified, without perforation or abscess without bleeding: K57.90

## 2018-11-26 HISTORY — DX: Personal history of colon polyps, unspecified: Z86.0100

## 2018-11-26 HISTORY — PX: COLONOSCOPY: SHX174

## 2018-11-26 HISTORY — DX: Personal history of colonic polyps: Z86.010

## 2018-11-26 MED ORDER — SODIUM CHLORIDE 0.9 % IV SOLN: 500.0000 mL | Freq: Once | INTRAVENOUS | Status: DC

## 2018-11-26 NOTE — Progress Notes (Signed)
PT taken to PACU. Monitors in place. VSS. Report given to RN. 

## 2018-11-26 NOTE — Op Note (Signed)
[facility] Patient Name: Deveon Ezekiel Procedure Date: 11/26/2018 1:24 PM MRN: [id number] Endoscopist: Ladene Artist , MD Age: 78 Referring MD:  Date of Birth: 20-Oct-1940 Gender: Male Account #: [id number] Procedure:                Colonoscopy Indications:              Surveillance: Personal history of adenomatous                            polyps on last colonoscopy 3 years ago. LLQ pain. Medicines:                Monitored Anesthesia Care Procedure:                Pre-Anesthesia Assessment:                           - Prior to the procedure, a History and Physical                            was performed, and patient medications and                            allergies were reviewed. The patient's tolerance of                            previous anesthesia was also reviewed. The risks                            and benefits of the procedure and the sedation                            options and risks were discussed with the patient.                            All questions were answered, and informed consent                            was obtained. Prior Anticoagulants: The patient has                            taken Eliquis (apixaban), last dose was 2 days                            prior to procedure. ASA Grade Assessment: III - A                            patient with severe systemic disease. After                            reviewing the risks and benefits, the patient was                            deemed in satisfactory condition to undergo the  procedure.                           After obtaining informed consent, the colonoscope                            was passed under direct vision. Throughout the                            procedure, the patient's blood pressure, pulse, and                            oxygen saturations were monitored continuously. The                            Colonoscope was introduced through the anus and                             advanced to the the cecum, identified by                            appendiceal orifice and ileocecal valve. The                            ileocecal valve, appendiceal orifice, and rectum                            were photographed. The quality of the bowel                            preparation was excellent. The colonoscopy was                            performed without difficulty. The patient tolerated                            the procedure well. Scope In: 1:40:58 PM Scope Out: 1:56:07 PM Scope Withdrawal Time: 0 hours 13 minutes 46 seconds  Total Procedure Duration: 0 hours 15 minutes 9 seconds  Findings:                 The perianal and digital rectal examinations were                            normal.                           A 8 mm polyp was found in the cecum. The polyp was                            sessile. The polyp was removed with a hot snare.                            Resection and retrieval were complete.  Two sessile polyps were found in the descending                            colon and ascending colon. The polyps were 6 to 7                            mm in size. These polyps were removed with a cold                            snare. Resection and retrieval were complete.                           A 3 mm polyp was found in the descending colon. The                            polyp was sessile. The polyp was removed with a                            cold biopsy forceps. Resection and retrieval were                            complete.                           Two medium-sized localized angiodysplastic lesions                            without bleeding were found in the transverse colon                            and at the ileocecal valve.                           Multiple medium-mouthed diverticula were found in                            the left colon. There was narrowing of the colon in                             association with the diverticular opening. There                            was evidence of diverticular spasm.                            Peri-diverticular erythema was seen. There was no                            evidence of diverticular bleeding.                           The exam was otherwise without abnormality on  direct and retroflexion views. Complications:            No immediate complications. Estimated blood loss:                            None. Estimated Blood Loss:     Estimated blood loss: none. Impression:               - One 8 mm polyp in the cecum, removed with a hot                            snare. Resected and retrieved.                           - Two 6 to 7 mm polyps in the descending colon and                            in the ascending colon, removed with a cold snare.                            Resected and retrieved.                           - One 3 mm polyp in the descending colon, removed                            with a cold biopsy forceps. Resected and retrieved.                           - Two non-bleeding colonic angiodysplastic lesions.                           - Moderate diverticulosis in the left colon.                           - The examination was otherwise normal on direct                            and retroflexion views. Recommendation:           - Resume Eliquis (apixaban) in 2 days at prior                            dose. Refer to managing physician for further                            adjustment of therapy.                           - Patient has a contact number available for                            emergencies. The signs and symptoms of potential  delayed complications were discussed with the                            patient. Return to normal activities tomorrow.                            Written discharge instructions were provided to the                             patient.                           - Resume previous diet.                           - Continue present medications.                           - Await pathology results.                           - No aspirin, ibuprofen, naproxen, or other                            non-steroidal anti-inflammatory drugs for 2 weeks                            after polyp removal.                           - No clear GI cause for ongoing LLQ pain noted.                           - No repeat colonoscopy due to age. Ladene Artist, MD 11/26/2018 2:07:39 PM This report has been signed electronically.

## 2018-11-26 NOTE — Patient Instructions (Signed)
Resume eliquis in 2 days at previous dose.  No NSAIDS for 2 weeks due to polyp removal.  Handouts given for polyps, high fiber diet and diverticulosis.  YOU HAD AN ENDOSCOPIC PROCEDURE TODAY AT [facility]:   Refer to the procedure report that was given to you for any specific questions about what was found during the examination.  If the procedure report does not answer your questions, please call your gastroenterologist to clarify.  If you requested that your care partner not be given the details of your procedure findings, then the procedure report has been included in a sealed envelope for you to review at your convenience later.  YOU SHOULD EXPECT: Some feelings of bloating in the abdomen. Passage of more gas than usual.  Walking can help get rid of the air that was put into your GI tract during the procedure and reduce the bloating. If you had a lower endoscopy (such as a colonoscopy or flexible sigmoidoscopy) you may notice spotting of blood in your stool or on the toilet paper. If you underwent a bowel prep for your procedure, you may not have a normal bowel movement for a few days.  Please Note:  You might notice some irritation and congestion in your nose or some drainage.  This is from the oxygen used during your procedure.  There is no need for concern and it should clear up in a day or so.  SYMPTOMS TO REPORT IMMEDIATELY:   Following lower endoscopy (colonoscopy or flexible sigmoidoscopy):  Excessive amounts of blood in the stool  Significant tenderness or worsening of abdominal pains  Swelling of the abdomen that is new, acute  Fever of 100F or higher  For urgent or emergent issues, a gastroenterologist can be reached at any hour by calling [phone].   DIET:  We do recommend a small meal at first, but then you may proceed to your regular diet.  Drink plenty of fluids but you should avoid alcoholic beverages for 24 hours.  ACTIVITY:  You should plan to  take it easy for the rest of today and you should NOT DRIVE or use heavy machinery until tomorrow (because of the sedation medicines used during the test).    FOLLOW UP: Our staff will call the number listed on your records 48-72 hours following your procedure to check on you and address any questions or concerns that you may have regarding the information given to you following your procedure. If we do not reach you, we will leave a message.  We will attempt to reach you two times.  During this call, we will ask if you have developed any symptoms of COVID 19. If you develop any symptoms (ie: fever, flu-like symptoms, shortness of breath, cough etc.) before then, please call [phone].  If you test positive for Covid 19 in the 2 weeks post procedure, please call and report this information to [place].    If any biopsies were taken you will be contacted by phone or by letter within the next 1-3 weeks.  Please call us at [phone] if you have not heard about the biopsies in 3 weeks.    SIGNATURES/CONFIDENTIALITY: You and/or your care partner have signed paperwork which will be entered into your electronic medical record.  These signatures attest to the fact that that the information above on your After Visit Summary has been reviewed and is understood.  Full responsibility of the confidentiality of this discharge information lies with you and/or your  care-partner.

## 2018-11-26 NOTE — Progress Notes (Signed)
Temperature- June Bullock VS- Courtney Washington 

## 2018-11-26 NOTE — Progress Notes (Signed)
Called to room to assist during endoscopic procedure.  Patient ID and intended procedure confirmed with present staff. Received instructions for my participation in the procedure from the performing physician.  

## 2018-11-28 ENCOUNTER — Telehealth: Payer: Self-pay

## 2018-11-28 NOTE — Telephone Encounter (Signed)
Attempted to reach patient for post-procedure f/u call. No answer. Left message that we will make another attempt to reach him again later today and for him to please not hesitate to call us if he has any questions/concerns regarding his care. 

## 2018-11-28 NOTE — Telephone Encounter (Signed)
  Follow up Call-  Call back number 11/26/2018  Post procedure Call Back phone  # [phone]  Permission to leave phone message Yes  Some recent data might be hidden     Patient questions:  Do you have a fever, pain , or abdominal swelling? No.  Yesterday patient experience cramping and pain but it has subsided and he is feeling better now. Pain Score  0 *  Have you tolerated food without any problems? Yes.    Have you been able to return to your normal activities? Yes.    Do you have any questions about your discharge instructions: Diet   No. Medications  No. Follow up visit  No.  Do you have questions or concerns about your Care? No.  Actions: * If pain score is 4 or above: No action needed, pain <4.  1. Have you developed a fever since your procedure? no  2.   Have you had an respiratory symptoms (SOB or cough) since your procedure? no  3.   Have you tested positive for COVID 19 since your procedure no  4.   Have you had any family members/close contacts diagnosed with the COVID 19 since your procedure?  no   If yes to any of these questions please route to Joylene John, RN and Alphonsa Gin, Therapist, sports.

## 2018-12-03 DIAGNOSIS — N200 Calculus of kidney: Secondary | ICD-10-CM | POA: Insufficient documentation

## 2018-12-04 ENCOUNTER — Encounter: Payer: Self-pay | Admitting: Gastroenterology

## 2018-12-10 ENCOUNTER — Other Ambulatory Visit: Payer: Self-pay | Admitting: Cardiology

## 2018-12-19 ENCOUNTER — Other Ambulatory Visit: Payer: Self-pay | Admitting: Physician Assistant

## 2018-12-23 ENCOUNTER — Other Ambulatory Visit: Payer: Self-pay | Admitting: Physician Assistant

## 2018-12-23 ENCOUNTER — Encounter: Payer: Self-pay | Admitting: Physician Assistant

## 2018-12-23 ENCOUNTER — Telehealth: Payer: Self-pay | Admitting: Cardiology

## 2018-12-23 NOTE — Telephone Encounter (Signed)
Spoke to patient Eliquis 5 mg samples left at [facility] office front desk. 

## 2018-12-23 NOTE — Telephone Encounter (Signed)
Indication for chronic opioid: Ankylosing Spondylitis, OA Medication and dose: Hydrocodone 10/325 mg # pills per month: #75  Last UDS date: 03/07/2018 Opioid Treatment Agreement signed (Y/N): Yes, 07/27/17 Opioid Treatment Agreement last reviewed with patient:   [facility] reviewed this encounter (include red flags):

## 2018-12-23 NOTE — Telephone Encounter (Signed)
Patient calling the office for samples of medication:   1.  What medication and dosage are you requesting samples for?   apixaban (ELIQUIS) 5 MG TABS tablet    2.  Are you currently out of this medication? Almost   

## 2018-12-24 ENCOUNTER — Other Ambulatory Visit: Payer: Self-pay | Admitting: *Deleted

## 2018-12-24 ENCOUNTER — Ambulatory Visit
Admission: RE | Admit: 2018-12-24 | Discharge: 2018-12-24 | Disposition: A | Discharge status: Home / Self Care | Payer: Medicare HMO | Source: Ambulatory Visit | Attending: Physician Assistant | Admitting: Physician Assistant

## 2018-12-24 ENCOUNTER — Ambulatory Visit (INDEPENDENT_AMBULATORY_CARE_PROVIDER_SITE_OTHER): Payer: Medicare HMO | Admitting: Physician Assistant

## 2018-12-24 ENCOUNTER — Other Ambulatory Visit: Payer: Self-pay

## 2018-12-24 ENCOUNTER — Encounter: Payer: Self-pay | Admitting: Physician Assistant

## 2018-12-24 VITALS — BP 130/80 | HR 60 | Temp 97.3°F | Resp 16 | Ht 70.5 in | Wt 210.0 lb

## 2018-12-24 DIAGNOSIS — R310 Gross hematuria: Secondary | ICD-10-CM

## 2018-12-24 DIAGNOSIS — N201 Calculus of ureter: Secondary | ICD-10-CM | POA: Diagnosis not present

## 2018-12-24 DIAGNOSIS — N200 Calculus of kidney: Secondary | ICD-10-CM

## 2018-12-24 DIAGNOSIS — R109 Unspecified abdominal pain: Secondary | ICD-10-CM | POA: Diagnosis not present

## 2018-12-24 LAB — POCT URINALYSIS DIPSTICK
Bilirubin, UA: NEGATIVE
Glucose, UA: NEGATIVE
Ketones, UA: NEGATIVE
Leukocytes, UA: NEGATIVE
Nitrite, UA: NEGATIVE
Protein, UA: POSITIVE — AB
Spec Grav, UA: 1.025 (ref 1.010–1.025)
Urobilinogen, UA: 0.2 E.U./dL
pH, UA: 6 (ref 5.0–8.0)

## 2018-12-24 MED FILL — HYDROCODON-APAP 10-325: 10-325 | 19 days supply | Qty: 75 | Fill #0

## 2018-12-24 MED FILL — ONDANSETRON HCL 4 MG TABLET: 4 | 3 days supply | Qty: 10 | Fill #0

## 2018-12-24 MED FILL — TAMSULOSIN HCL 0.4 MG CAP: 0.4 | 14 days supply | Qty: 14 | Fill #0

## 2018-12-24 NOTE — Patient Instructions (Signed)
Please go to the lab today for blood work.  I will call you with your results. We will alter treatment regimen(s) if indicated by your results.   Please speak with [facility] regarding your CT Scan.   Continue pain medication as directed. If pain becomes severe before we can get imaging, please go to the ER.  We will know how best to treat once we get your results.

## 2018-12-24 NOTE — Progress Notes (Signed)
Patient presents to clinic today c/o a few days of gross hematuria now with 1 day of L flank pain. Denies fever, chills, malaise or fatigue. Denies dysuria, urgency, frequency, hestiancy or hematuria. Denies abdominal pain or pain radiating into groin. Has history of nephrolithiasis. Pain currently 3/5 with his chronic pain medication.   Past Medical History:  Diagnosis Date  . Ankylosing spondylitis ([place])   . Arthritis   . CAD (coronary artery disease) 6294,7654   30% mid LAD lesion on cardiac cath  . Chicken pox   . Cholelithiasis   . Gallstones   . GERD (gastroesophageal reflux disease)   . [place] measles   . Hiatal hernia   . HLD (hyperlipidemia)   . HTN (hypertension)   . Hypertension   . Nephrolithiasis   . Persistent atrial fibrillation ([place])   . Sleep apnea    No CPAP  . 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 Ventricular hypertrophy     Current Outpatient Medications on File Prior to Visit  Medication Sig Dispense Refill  . allopurinol (ZYLOPRIM) 100 MG tablet TAKE 1 TABLET EVERY DAY 90 tablet 1  . apixaban (ELIQUIS) 5 MG TABS tablet Take 1 tablet (5 mg total) by mouth 2 (two) times daily. 30 tablet 0  . atorvastatin (LIPITOR) 10 MG tablet TAKE 1 TABLET EVERY DAY 90 tablet 1  . cyclobenzaprine (FLEXERIL) 10 MG tablet Take 1 tablet (10 mg total) by mouth 3 (three) times daily as needed for muscle spasms. 15 tablet 0  . diltiazem (CARDIZEM CD) 240 MG 24 hr capsule TAKE 1 CAPSULE EVERY DAY (DUE FOR APPOINTMENT FOR FUTURE REFILLS) 90 capsule 1  . furosemide (LASIX) 20 MG tablet TAKE 1 TABLET EVERY DAY 90 tablet 0  . HYDROcodone-acetaminophen (NORCO) 10-325 MG tablet TAKE 1 TABLET BY MOUTH EVERY 6 HOURS AS NEEDED. 75 tablet 0  . Loperamide HCl (IMODIUM PO) Take by mouth as needed. Reported on 05/10/2015    . metoprolol succinate (TOPROL-XL) 100 MG 24 hr tablet TAKE 1 TABLET  TWO TIMES DAILY  WITH OR IMMEDIATELY FOLLOWING A MEAL. 180 tablet 1  . sildenafil (REVATIO) 20 MG tablet  TAKE 1 TABLET BY MOUTH AS NEEDED FOR ERECTILE DYSFUNCTION. DO NO TAKE MORE THAN 1 DOSE IN 36 HOURS. 30 tablet 0   No current facility-administered medications on file prior to visit.     Allergies  Allergen Reactions  . Coumadin [Warfarin] Other (See Comments)    GI bleeds    Family History  Problem Relation Age of Onset  . Heart disease Father 76       Deceased  . Heart attack Father 7  . Heart disease Brother 61       "Died in his sleep"  . Arthritis/Rheumatoid Mother        Deceased-86  . Hyperlipidemia Brother   . Stroke Brother 37       Deceased  . Other Sister        Estate agent in [place]  . Other Sister        MVA  . Other Brother        Carjacked-killed  . Dementia Paternal Grandmother   . Cancer Paternal Uncle   . Healthy Brother   . Healthy Son        X3  . Healthy Daughter        x1  . Colon cancer Neg Hx   . Esophageal cancer Neg Hx   . Stomach cancer Neg Hx   .  Rectal cancer Neg Hx     Social History   Socioeconomic History  . Marital status: Married    Spouse name: Not on file  . Number of children: 4  . Years of education: Not on file  . Highest education level: Not on file  Occupational History  . 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 Financial resource strain: Not on file  . Food insecurity    Worry: Not on file    Inability: Not on file  . Transportation needs    Medical: Not on file    Non-medical: Not on file  Tobacco Use  . Smoking status: Never Smoker  . Smokeless tobacco: Never Used  Substance and Sexual Activity  . Alcohol use: No  . Drug use: No  . Sexual activity: Not on file  Lifestyle  . Physical activity    Days per week: Not on file    Minutes per session: Not on file  . Stress: Not on file  Relationships  . Social Herbalist on phone: Not on file    Gets together: Not on file    Attends religious service: Not on file    Active member of club or organization:  Not on file    Attends meetings of clubs or organizations: Not on file    Relationship status: Not on file  Other Topics Concern  . Not on file  Social History Narrative   4 caffeine drinks daily    Review of Systems - See HPI.  All other ROS are negative.  There were no vitals taken for this visit.  Physical Exam Vitals signs reviewed.  Constitutional:      Appearance: Normal appearance.  HENT:     Head: Normocephalic and atraumatic.  Neck:     Musculoskeletal: Neck supple.  Cardiovascular:     Rate and Rhythm: Normal rate and regular rhythm.     Pulses: Normal pulses.     Heart sounds: Normal heart sounds.  Pulmonary:     Effort: Pulmonary effort is normal.  Abdominal:     General: Bowel sounds are normal. There is no distension.     Palpations: Abdomen is soft.     Tenderness: There is no abdominal tenderness. There is no right CVA tenderness or left CVA tenderness.  Neurological:     Mental Status: He is alert.  Psychiatric:        Mood and Affect: Mood normal.    Recent Results (from the past 2160 hour(s))  Basic metabolic panel     Status: Abnormal   Collection Time: 10/23/18  9:28 AM  Result Value Ref Range   Sodium 143 135 - 145 mEq/L   Potassium 3.8 3.5 - 5.1 mEq/L   Chloride 104 96 - 112 mEq/L   CO2 29 19 - 32 mEq/L   Glucose, Bld 123 (H) 70 - 99 mg/dL   BUN 14 6 - 23 mg/dL   Creatinine, Ser 1.19 0.40 - 1.50 mg/dL   Calcium 9.6 8.4 - 10.5 mg/dL   GFR 59.17 (L) >60.00 mL/min    Assessment/Plan: 1. Gross hematuria 2. Nephrolithiasis Initially gross hematuria x 2 days now with L-sided flank pain. UA + for blood and trace protein. No LE or nitrites. Giving current symptomology and history of nephrolithiasis with proceed with CT renal study STAT so we can further characterize stone and determine if MET reasonable or will need  Urology referral for ESWL/ureteroscopy. Will check BMP to ensure stable renal function with likely obstruction. Patient already has Rx  pain medication. Strict ER precautions reviewed.   - CT RENAL STONE STUDY; Future - Basic metabolic panel   Leeanne Rio, PA-C

## 2018-12-25 LAB — BASIC METABOLIC PANEL
BUN/Creatinine Ratio: 13 (calc) (ref 6–22)
BUN: 23 mg/dL (ref 7–25)
CO2: 25 mmol/L (ref 20–32)
Calcium: 9.1 mg/dL (ref 8.6–10.3)
Chloride: 104 mmol/L (ref 98–110)
Creat: 1.77 mg/dL — ABNORMAL HIGH (ref 0.70–1.18)
Glucose, Bld: 104 mg/dL — ABNORMAL HIGH (ref 65–99)
Potassium: 4.4 mmol/L (ref 3.5–5.3)
Sodium: 141 mmol/L (ref 135–146)

## 2018-12-25 LAB — EXTRA LAV TOP TUBE

## 2018-12-30 ENCOUNTER — Encounter (HOSPITAL_COMMUNITY): Payer: Self-pay

## 2018-12-30 ENCOUNTER — Other Ambulatory Visit: Payer: Self-pay | Admitting: Urology

## 2018-12-31 ENCOUNTER — Other Ambulatory Visit: Payer: Self-pay

## 2018-12-31 ENCOUNTER — Encounter (HOSPITAL_COMMUNITY): Payer: Self-pay | Admitting: *Deleted

## 2018-12-31 ENCOUNTER — Other Ambulatory Visit (HOSPITAL_COMMUNITY)
Admission: RE | Admit: 2018-12-31 | Discharge: 2018-12-31 | Disposition: A | Discharge status: Home / Self Care | Payer: Medicare HMO | Source: Ambulatory Visit | Attending: Urology | Admitting: Urology

## 2018-12-31 DIAGNOSIS — Z20828 Contact with and (suspected) exposure to other viral communicable diseases: Secondary | ICD-10-CM | POA: Diagnosis not present

## 2018-12-31 DIAGNOSIS — Z01812 Encounter for preprocedural laboratory examination: Secondary | ICD-10-CM | POA: Insufficient documentation

## 2018-12-31 LAB — SARS CORONAVIRUS 2 (TAT 6-24 HRS): SARS Coronavirus 2: NEGATIVE

## 2018-12-31 NOTE — Progress Notes (Addendum)
PCP - Raiford Noble, PA LOV 12/24/2018 Cardiologist - Dr. Rhae Hammock -telemedicine with Desiree Hane on 07/30/2018  Chest x-ray - 06/16/2015 EPIC EKG -10/17/2016 EPIC  Stress Test - 04/10/2016 EPIC ECHO - 04/16/2016  EPIC Cardiac Cath - 03/24/2012 EPIC  Sleep Study - 7-8 years ago-Diagnosed with Sleep apnea CPAP - patient states that he does not use CPAP  Fasting Blood Sugar - N/A Checks Blood Sugar ___0__ times a day  Blood Thinner Instructions:Per Dr. Tresa Moore, Patient to remain on anti-coagulants -Eliquis Aspirin Instructions:N/A Last Dose:will be morning of surgery 01/02/2019  Anesthesia review:  Chart given to Konrad Felix, PA to review.  Patient has a history of Atrial Fibrillation, emphysema, OSA (No CPAP), CAD,  And HTN.  Patient denies shortness of breath, fever, cough and chest pain at PAT appointment   Patient verbalized understanding of instructions that were given to them at the PAT appointment. Patient was also instructed that they will need to review over the PAT instructions again at home before surgery.

## 2019-01-01 ENCOUNTER — Other Ambulatory Visit: Payer: Self-pay | Admitting: Physician Assistant

## 2019-01-01 ENCOUNTER — Other Ambulatory Visit: Payer: Self-pay | Admitting: Emergency Medicine

## 2019-01-01 DIAGNOSIS — R7989 Other specified abnormal findings of blood chemistry: Secondary | ICD-10-CM

## 2019-01-01 NOTE — Progress Notes (Signed)
Anesthesia Chart Review   Case: [id number] Date/Time: 01/02/19 1601   Procedures:      CYSTOSCOPY WITH RETROGRADE PYELOGRAM, URETEROSCOPY AND STENT PLACEMENT (Right ) - 1 HR     HOLMIUM LASER APPLICATION (Right )   Anesthesia type: General   Pre-op diagnosis: RIGHT URETERAL STONE   Location: [facility] ROOM 03 / [facility]   Surgeon: Alexis Frock, MD      DISCUSSION:78 y.o. never smoker with h/o atrial fibrillation (on Eliquis), HTN, sleep apnea w/o device, ankylosing spondylitis, GERD, nonobstructive CAD, HLD, right ureteral stone scheduled for above procedure 01/02/2019 with Dr. Alexis Frock.   Last seen by cardiology 07/30/2018.  Stable at this visit.    He has been advised by Dr. Tresa Moore not to hold Eliquis prior to procedure.   Anticipate pt can proceed with planned procedure barring acute status change and after evaluation DOS.  VS: There were no vitals taken for this visit.  PROVIDERS: Brunetta Jeans, PA-C is PCP   Minus Breeding, MD is Cardiologist  LABS: Labs reviewed: Acceptable for surgery. (all labs ordered are listed, but only abnormal results are displayed)  Labs Reviewed - No data to display   IMAGES:   EKG:   CV: Echo 04/16/2016 Study Conclusions  - Left ventricle: The cavity size was normal. Systolic function was   normal. The estimated ejection fraction was in the range of 55%   to 60%. Wall motion was normal; there were no regional wall   motion abnormalities. - Aortic valve: There was mild regurgitation. - Left atrium: The atrium was mildly dilated. - Atrial septum: No defect or patent foramen ovale was identified. - Pericardium, extracardiac: Small posteriro pericardial effusion  ETT 04/10/2016  Blood pressure demonstrated a hypertensive response to exercise.  ST segment depression was noted during stress in the II, III, aVF, V5 and V6 leads.   ETT with poor exercise tolerance (5: 05); no chest pain; hypertensive BP response; pt in atrial  fibrillation during the study; significant artifact; 1-2 mm ST depression in the inferolateral leads with peak exertion felt to be abnormal. Past Medical History:  Diagnosis Date  . Ankylosing spondylitis ([place])   . Aortic atherosclerosis ([place])   . Arthritis   . Bilateral inguinal hernia   . CAD (coronary artery disease) [id number]   30% mid LAD lesion on cardiac cath  . Chicken pox   . Cholelithiasis   . Diverticulosis 11/26/2018   Moderate, Left Noted on Colonoscopy  . Dysrhythmia    atrial fibrillation  . Gallstones   . GERD (gastroesophageal reflux disease)   . [place] measles   . Hiatal hernia   . History of colon polyps 11/26/2018  . History of kidney stones   . HLD (hyperlipidemia)   . HTN (hypertension)   . Hypertension   . Nephrolithiasis   . Persistent atrial fibrillation ([place])   . Sleep apnea    No CPAP  . 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 Ventricular hypertrophy     Past Surgical History:  Procedure Laterality Date  . CARDIAC CATHETERIZATION  03/2012   30% mid LAD lesion otherwise normal cors, LVEF 65%  . CHOLECYSTECTOMY    . COLONOSCOPY  11/26/2018  . ELBOW ARTHROPLASTY Left   . HAND SURGERY Left   . KNEE SURGERY     x2 right  . LEFT AND RIGHT HEART CATHETERIZATION WITH CORONARY ANGIOGRAM N/A 03/24/2012   Procedure: LEFT AND RIGHT HEART CATHETERIZATION WITH CORONARY ANGIOGRAM;  Surgeon: Sherren Mocha, MD;  Location: [facility] [place] CATH LAB;  Service: Cardiovascular;  Laterality: N/A;  . LEG FLUID REMOVAL RIGHT    . TONSILLECTOMY    . UPPER GI ENDOSCOPY      MEDICATIONS: No current facility-administered medications for this encounter.    Marland Kitchen allopurinol (ZYLOPRIM) 100 MG tablet  . apixaban (ELIQUIS) 5 MG TABS tablet  . atorvastatin (LIPITOR) 10 MG tablet  . cyclobenzaprine (FLEXERIL) 10 MG tablet  . diltiazem (CARDIZEM CD) 240 MG 24 hr capsule  . furosemide (LASIX) 20 MG tablet  . HYDROcodone-acetaminophen (NORCO) 10-325 MG tablet  . loperamide (IMODIUM) 2 MG  capsule  . metoprolol succinate (TOPROL-XL) 100 MG 24 hr tablet  . ondansetron (ZOFRAN) 4 MG tablet  . sildenafil (REVATIO) 20 MG tablet  . tamsulosin (FLOMAX) 0.4 MG CAPS capsule  . Loperamide HCl (IMODIUM PO)     Maia Plan [facility] Pre-Surgical Testing [phone] 01/01/19  2:23 PM

## 2019-01-02 ENCOUNTER — Encounter (HOSPITAL_COMMUNITY)
Admission: RE | Disposition: A | Discharge status: Home / Self Care | Payer: Self-pay | Source: Home / Self Care | Attending: Urology

## 2019-01-02 ENCOUNTER — Encounter (HOSPITAL_COMMUNITY): Payer: Self-pay | Admitting: Certified Registered"

## 2019-01-02 ENCOUNTER — Ambulatory Visit (HOSPITAL_COMMUNITY): Payer: Medicare HMO

## 2019-01-02 ENCOUNTER — Ambulatory Visit (HOSPITAL_COMMUNITY): Payer: Medicare HMO | Admitting: Physician Assistant

## 2019-01-02 ENCOUNTER — Other Ambulatory Visit: Payer: Self-pay

## 2019-01-02 ENCOUNTER — Ambulatory Visit (HOSPITAL_COMMUNITY)
Admission: RE | Admit: 2019-01-02 | Discharge: 2019-01-02 | Disposition: A | Discharge status: Home / Self Care | Payer: Medicare HMO | Attending: Urology | Admitting: Urology

## 2019-01-02 DIAGNOSIS — N201 Calculus of ureter: Secondary | ICD-10-CM | POA: Diagnosis not present

## 2019-01-02 DIAGNOSIS — M109 Gout, unspecified: Secondary | ICD-10-CM | POA: Insufficient documentation

## 2019-01-02 DIAGNOSIS — E785 Hyperlipidemia, unspecified: Secondary | ICD-10-CM | POA: Diagnosis not present

## 2019-01-02 DIAGNOSIS — Z87442 Personal history of urinary calculi: Secondary | ICD-10-CM | POA: Insufficient documentation

## 2019-01-02 DIAGNOSIS — I2510 Atherosclerotic heart disease of native coronary artery without angina pectoris: Secondary | ICD-10-CM | POA: Diagnosis not present

## 2019-01-02 DIAGNOSIS — Z7901 Long term (current) use of anticoagulants: Secondary | ICD-10-CM | POA: Diagnosis not present

## 2019-01-02 DIAGNOSIS — J449 Chronic obstructive pulmonary disease, unspecified: Secondary | ICD-10-CM | POA: Insufficient documentation

## 2019-01-02 DIAGNOSIS — G4730 Sleep apnea, unspecified: Secondary | ICD-10-CM | POA: Diagnosis not present

## 2019-01-02 DIAGNOSIS — I4819 Other persistent atrial fibrillation: Secondary | ICD-10-CM | POA: Diagnosis not present

## 2019-01-02 DIAGNOSIS — I10 Essential (primary) hypertension: Secondary | ICD-10-CM | POA: Diagnosis not present

## 2019-01-02 DIAGNOSIS — Z79899 Other long term (current) drug therapy: Secondary | ICD-10-CM | POA: Insufficient documentation

## 2019-01-02 DIAGNOSIS — N132 Hydronephrosis with renal and ureteral calculous obstruction: Secondary | ICD-10-CM | POA: Diagnosis not present

## 2019-01-02 HISTORY — PX: HOLMIUM LASER APPLICATION: SHX5852

## 2019-01-02 HISTORY — DX: Personal history of urinary calculi: Z87.442

## 2019-01-02 HISTORY — DX: Cardiac arrhythmia, unspecified: I49.9

## 2019-01-02 HISTORY — DX: Bilateral inguinal hernia, without obstruction or gangrene, not specified as recurrent: K40.20

## 2019-01-02 HISTORY — DX: Atherosclerosis of aorta: I70.0

## 2019-01-02 HISTORY — PX: CYSTOSCOPY WITH RETROGRADE PYELOGRAM, URETEROSCOPY AND STENT PLACEMENT: SHX5789

## 2019-01-02 LAB — CBC
HCT: 51.5 % (ref 39.0–52.0)
Hemoglobin: 17 g/dL (ref 13.0–17.0)
MCH: 30.1 pg (ref 26.0–34.0)
MCHC: 33 g/dL (ref 30.0–36.0)
MCV: 91.3 fL (ref 80.0–100.0)
Platelets: 224 10*3/uL (ref 150–400)
RBC: 5.64 MIL/uL (ref 4.22–5.81)
RDW: 13.9 % (ref 11.5–15.5)
WBC: 10.3 10*3/uL (ref 4.0–10.5)
nRBC: 0 % (ref 0.0–0.2)

## 2019-01-02 SURGERY — CYSTOURETEROSCOPY, WITH RETROGRADE PYELOGRAM AND STENT INSERTION
Anesthesia: General | Laterality: Right

## 2019-01-02 MED ORDER — FENTANYL CITRATE (PF) 100 MCG/2ML IJ SOLN
INTRAMUSCULAR | Status: AC
  Filled 2019-01-02: qty 2

## 2019-01-02 MED ORDER — EPHEDRINE 5 MG/ML INJ
INTRAVENOUS | Status: AC
  Filled 2019-01-02: qty 10

## 2019-01-02 MED ORDER — LIDOCAINE 2% (20 MG/ML) 5 ML SYRINGE
INTRAMUSCULAR | Status: DC | PRN
  Administered 2019-01-02: 40 mg via INTRAVENOUS
  Administered 2019-01-02: 60 mg via INTRAVENOUS

## 2019-01-02 MED ORDER — IOHEXOL 300 MG/ML  SOLN
INTRAMUSCULAR | Status: DC | PRN
  Administered 2019-01-02: 12 mL via URETHRAL

## 2019-01-02 MED ORDER — PHENYLEPHRINE 40 MCG/ML (10ML) SYRINGE FOR IV PUSH (FOR BLOOD PRESSURE SUPPORT)
PREFILLED_SYRINGE | INTRAVENOUS | Status: DC | PRN
  Administered 2019-01-02: 80 ug via INTRAVENOUS
  Administered 2019-01-02: 200 ug via INTRAVENOUS
  Administered 2019-01-02 (×2): 120 ug via INTRAVENOUS
  Administered 2019-01-02: 80 ug via INTRAVENOUS

## 2019-01-02 MED ORDER — FENTANYL CITRATE (PF) 100 MCG/2ML IJ SOLN: 25.0000 ug | INTRAMUSCULAR | Status: DC | PRN

## 2019-01-02 MED ORDER — ACETAMINOPHEN 500 MG PO TABS
1000.0000 mg | ORAL_TABLET | Freq: Once | ORAL | Status: AC
  Administered 2019-01-02: 1000 mg via ORAL
  Filled 2019-01-02: qty 2

## 2019-01-02 MED ORDER — OXYCODONE-ACETAMINOPHEN 5-325 MG PO TABS: 1.0000 | ORAL_TABLET | Freq: Three times a day (TID) | ORAL | 0 refills | Status: DC | PRN

## 2019-01-02 MED ORDER — ROCURONIUM BROMIDE 10 MG/ML (PF) SYRINGE
PREFILLED_SYRINGE | INTRAVENOUS | Status: AC
  Filled 2019-01-02: qty 10

## 2019-01-02 MED ORDER — SUCCINYLCHOLINE CHLORIDE 200 MG/10ML IV SOSY
PREFILLED_SYRINGE | INTRAVENOUS | Status: AC
  Filled 2019-01-02: qty 10

## 2019-01-02 MED ORDER — DEXAMETHASONE SODIUM PHOSPHATE 10 MG/ML IJ SOLN
INTRAMUSCULAR | Status: DC | PRN
  Administered 2019-01-02: 10 mg via INTRAVENOUS

## 2019-01-02 MED ORDER — DEXAMETHASONE SODIUM PHOSPHATE 10 MG/ML IJ SOLN
INTRAMUSCULAR | Status: AC
  Filled 2019-01-02: qty 1

## 2019-01-02 MED ORDER — PHENYLEPHRINE HCL-NACL 10-0.9 MG/250ML-% IV SOLN
INTRAVENOUS | Status: DC | PRN
  Administered 2019-01-02: 25 ug/min via INTRAVENOUS

## 2019-01-02 MED ORDER — CEFAZOLIN (ANCEF) 1 G IV SOLR: 2.0000 g | INTRAVENOUS | Status: DC

## 2019-01-02 MED ORDER — CEPHALEXIN 500 MG PO CAPS: 500.0000 mg | ORAL_CAPSULE | Freq: Two times a day (BID) | ORAL | 0 refills | Status: DC

## 2019-01-02 MED ORDER — OXYCODONE HCL 5 MG/5ML PO SOLN: 5.0000 mg | Freq: Once | ORAL | Status: DC | PRN

## 2019-01-02 MED ORDER — CEFAZOLIN SODIUM-DEXTROSE 2-4 GM/100ML-% IV SOLN
2.0000 g | Freq: Once | INTRAVENOUS | Status: AC
  Administered 2019-01-02: 2 g via INTRAVENOUS

## 2019-01-02 MED ORDER — MIDAZOLAM HCL 2 MG/2ML IJ SOLN
INTRAMUSCULAR | Status: DC | PRN
  Administered 2019-01-02: 1 mg via INTRAVENOUS

## 2019-01-02 MED ORDER — PHENYLEPHRINE 40 MCG/ML (10ML) SYRINGE FOR IV PUSH (FOR BLOOD PRESSURE SUPPORT)
PREFILLED_SYRINGE | INTRAVENOUS | Status: AC
  Filled 2019-01-02: qty 10

## 2019-01-02 MED ORDER — CEFAZOLIN SODIUM-DEXTROSE 2-4 GM/100ML-% IV SOLN
INTRAVENOUS | Status: AC
  Filled 2019-01-02: qty 100

## 2019-01-02 MED ORDER — SUCCINYLCHOLINE CHLORIDE 20 MG/ML IJ SOLN
INTRAMUSCULAR | Status: DC | PRN
  Administered 2019-01-02: 120 mg via INTRAVENOUS

## 2019-01-02 MED ORDER — ONDANSETRON HCL 4 MG/2ML IJ SOLN
INTRAMUSCULAR | Status: DC | PRN
  Administered 2019-01-02: 4 mg via INTRAVENOUS

## 2019-01-02 MED ORDER — LACTATED RINGERS IV SOLN
INTRAVENOUS | Status: DC
  Administered 2019-01-02 (×2): via INTRAVENOUS

## 2019-01-02 MED ORDER — PROPOFOL 10 MG/ML IV BOLUS
INTRAVENOUS | Status: AC
  Filled 2019-01-02: qty 20

## 2019-01-02 MED ORDER — FENTANYL CITRATE (PF) 100 MCG/2ML IJ SOLN
INTRAMUSCULAR | Status: DC | PRN
  Administered 2019-01-02: 50 ug via INTRAVENOUS
  Administered 2019-01-02: 25 ug via INTRAVENOUS

## 2019-01-02 MED ORDER — PROMETHAZINE HCL 25 MG/ML IJ SOLN: 6.2500 mg | INTRAMUSCULAR | Status: DC | PRN

## 2019-01-02 MED ORDER — ONDANSETRON HCL 4 MG/2ML IJ SOLN
INTRAMUSCULAR | Status: AC
  Filled 2019-01-02: qty 2

## 2019-01-02 MED ORDER — FENTANYL CITRATE (PF) 250 MCG/5ML IJ SOLN: INTRAMUSCULAR | Status: DC | PRN

## 2019-01-02 MED ORDER — PHENYLEPHRINE HCL (PRESSORS) 10 MG/ML IV SOLN
INTRAVENOUS | Status: AC
  Filled 2019-01-02: qty 1

## 2019-01-02 MED ORDER — MIDAZOLAM HCL 2 MG/2ML IJ SOLN
INTRAMUSCULAR | Status: AC
  Filled 2019-01-02: qty 2

## 2019-01-02 MED ORDER — LIDOCAINE 2% (20 MG/ML) 5 ML SYRINGE
INTRAMUSCULAR | Status: AC
  Filled 2019-01-02: qty 5

## 2019-01-02 MED ORDER — SUGAMMADEX SODIUM 500 MG/5ML IV SOLN
INTRAVENOUS | Status: AC
  Filled 2019-01-02: qty 5

## 2019-01-02 MED ORDER — SODIUM CHLORIDE 0.9 % IR SOLN
Status: DC | PRN
  Administered 2019-01-02: 3000 mL via INTRAVESICAL

## 2019-01-02 MED ORDER — 0.9 % SODIUM CHLORIDE (POUR BTL) OPTIME
TOPICAL | Status: DC | PRN
  Administered 2019-01-02: 1000 mL

## 2019-01-02 MED ORDER — OXYCODONE HCL 5 MG PO TABS: 5.0000 mg | ORAL_TABLET | Freq: Once | ORAL | Status: DC | PRN

## 2019-01-02 MED ORDER — PROPOFOL 10 MG/ML IV BOLUS
INTRAVENOUS | Status: DC | PRN
  Administered 2019-01-02: 160 mg via INTRAVENOUS
  Administered 2019-01-02: 40 mg via INTRAVENOUS

## 2019-01-02 SURGICAL SUPPLY — 28 items
BAG URO CATCHER STRL LF (MISCELLANEOUS) ×3 IMPLANT
BASKET LASER NITINOL 1.9FR (BASKET) ×2 IMPLANT
BSKT STON RTRVL 120 1.9FR (BASKET) ×1
CATH INTERMIT  6FR 70CM (CATHETERS) ×3 IMPLANT
CLOTH BEACON ORANGE TIMEOUT ST (SAFETY) ×3 IMPLANT
COVER SURGICAL LIGHT HANDLE (MISCELLANEOUS) ×1 IMPLANT
COVER WAND RF STERILE (DRAPES) IMPLANT
EXTRACTOR STONE 1.7FRX115CM (UROLOGICAL SUPPLIES) IMPLANT
FIBER LASER FLEXIVA 1000 (UROLOGICAL SUPPLIES) IMPLANT
FIBER LASER FLEXIVA 365 (UROLOGICAL SUPPLIES) IMPLANT
FIBER LASER FLEXIVA 550 (UROLOGICAL SUPPLIES) IMPLANT
FIBER LASER TRAC TIP (UROLOGICAL SUPPLIES) ×2 IMPLANT
GLOVE BIOGEL M STRL SZ7.5 (GLOVE) ×3 IMPLANT
GOWN STRL REUS W/TWL LRG LVL3 (GOWN DISPOSABLE) ×3 IMPLANT
GOWN STRL REUS W/TWL XL LVL3 (GOWN DISPOSABLE) ×2 IMPLANT
GUIDEWIRE ANG ZIPWIRE 038X150 (WIRE) ×3 IMPLANT
GUIDEWIRE STR DUAL SENSOR (WIRE) ×3 IMPLANT
KIT TURNOVER KIT A (KITS) ×2 IMPLANT
MANIFOLD NEPTUNE II (INSTRUMENTS) ×3 IMPLANT
PACK CYSTO (CUSTOM PROCEDURE TRAY) ×3 IMPLANT
PENCIL SMOKE EVACUATOR (MISCELLANEOUS) IMPLANT
SHEATH URETERAL 12FRX28CM (UROLOGICAL SUPPLIES) IMPLANT
SHEATH URETERAL 12FRX35CM (MISCELLANEOUS) ×2 IMPLANT
STENT POLARIS 5FRX26 (STENTS) ×2 IMPLANT
TUBE FEEDING 8FR 16IN STR KANG (MISCELLANEOUS) ×3 IMPLANT
TUBING CONNECTING 10 (TUBING) ×2 IMPLANT
TUBING CONNECTING 10' (TUBING) ×1
TUBING UROLOGY SET (TUBING) ×3 IMPLANT

## 2019-01-02 NOTE — H&P (Signed)
[name] Woods is an 78 y.o. male.    Chief Complaint: Pre-Op RIGHT Ureteroscopic Stone Manipulation  HPI:   1 - Recurrent Nephrolithiasis -  Pre 2016 - multiple episodes colic managed medically, none in 20 years 03/2014 - CT bilateraal small intrarenal stones, non-obstructing, largest approx 19mm, no hydro 11/2018 - CT Rt 67mm prox stone with mod hydro + 29mm Rt mid and Lt non-opbstructing by Er CT.   PMH sig for lap chole, TNA, HTN, AFib / Eliquus for primary prevention (follows Hochrein, cards) , Gout. His PCP is Dr. Sherren Mocha.  Today " [name] Woods " is seen to proceed with RIGHT ureteroscopic stone manipulation with goal of Rt side stone free. No interval fevers. C19 screen negative.    Past Medical History:  Diagnosis Date  . Ankylosing spondylitis ([place])   . Aortic atherosclerosis ([place])   . Arthritis   . Bilateral inguinal hernia   . CAD (coronary artery disease) [id number]   30% mid LAD lesion on cardiac cath  . Chicken pox   . Cholelithiasis   . Diverticulosis 11/26/2018   Moderate, Left Noted on Colonoscopy  . Dysrhythmia    atrial fibrillation  . Gallstones   . GERD (gastroesophageal reflux disease)   . [place] measles   . Hiatal hernia   . History of colon polyps 11/26/2018  . History of kidney stones   . HLD (hyperlipidemia)   . HTN (hypertension)   . Hypertension   . Nephrolithiasis   . Persistent atrial fibrillation ([place])   . Sleep apnea    No CPAP  . 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 Ventricular hypertrophy     Past Surgical History:  Procedure Laterality Date  . CARDIAC CATHETERIZATION  03/2012   30% mid LAD lesion otherwise normal cors, LVEF 65%  . CHOLECYSTECTOMY    . COLONOSCOPY  11/26/2018  . ELBOW ARTHROPLASTY Left   . HAND SURGERY Left   . KNEE SURGERY     x2 right  . LEFT AND RIGHT HEART CATHETERIZATION WITH CORONARY ANGIOGRAM N/A 03/24/2012   Procedure: LEFT AND RIGHT HEART CATHETERIZATION WITH CORONARY ANGIOGRAM;  Surgeon: Sherren Mocha, MD;   Location: [facility] CATH LAB;  Service: Cardiovascular;  Laterality: N/A;  . LEG FLUID REMOVAL RIGHT    . TONSILLECTOMY    . UPPER GI ENDOSCOPY      Family History  Problem Relation Age of Onset  . Heart disease Father 78       Deceased  . Heart attack Father 63  . Heart disease Brother 7       "Died in his sleep"  . Arthritis/Rheumatoid Mother        Deceased-86  . Hyperlipidemia Brother   . Stroke Brother 1       Deceased  . Other Sister        Estate agent in [place]  . Other Sister        MVA  . Other Brother        Carjacked-killed  . Dementia Paternal Grandmother   . Cancer Paternal Uncle   . Healthy Brother   . Healthy Son        X3  . Healthy Daughter        x1  . Colon cancer Neg Hx   . Esophageal cancer Neg Hx   . Stomach cancer Neg Hx   . Rectal cancer Neg Hx    Social History:  reports that he has never smoked. He has never used smokeless tobacco. He  reports that he does not drink alcohol or use drugs.  Allergies:  Allergies  Allergen Reactions  . Coumadin [Warfarin] Other (See Comments)    GI bleeds    No medications prior to admission.    Results for orders placed or performed during the hospital encounter of 12/31/18 (from the past 48 hour(s))  SARS CORONAVIRUS 2 (TAT 6-24 HRS) Nasopharyngeal Nasopharyngeal Swab     Status: None   Collection Time: 12/31/18 11:23 AM   Specimen: Nasopharyngeal Swab  Result Value Ref Range   SARS Coronavirus 2 NEGATIVE NEGATIVE    Comment: (NOTE) SARS-CoV-2 target nucleic acids are NOT DETECTED. The SARS-CoV-2 RNA is generally detectable in upper and lower respiratory specimens during the acute phase of infection. Negative results do not preclude SARS-CoV-2 infection, do not rule out co-infections with other pathogens, and should not be used as the sole basis for treatment or other patient management decisions. Negative results must be combined with clinical observations, patient history, and epidemiological information. The  expected result is Negative. Fact Sheet for Patients: SugarRoll.be Fact Sheet for Healthcare Providers: https://www.woods-mathews.com/ This test is not yet approved or cleared by the [place] FDA and  has been authorized for detection and/or diagnosis of SARS-CoV-2 by FDA under an Emergency Use Authorization (EUA). This EUA will remain  in effect (meaning this test can be used) for the duration of the COVID-19 declaration under Section 56 4(b)(1) of the Act, 21 U.S.C. section 360bbb-3(b)(1), unless the authorization is terminated or revoked sooner. Performed at [facility], [place] [address], [place], [place] [address]    No results found.  Review of Systems  Constitutional: Negative for chills and fever.  Genitourinary: Positive for flank pain and hematuria.    There were no vitals taken for this visit. Physical Exam  Constitutional: He appears well-developed.  HENT:  Head: Normocephalic.  Eyes: Pupils are equal, round, and reactive to light.  Neck: Normal range of motion.  Respiratory: Effort normal.  GI: Soft.  Genitourinary:    Genitourinary Comments: Mild Rt CVAT at present.    Musculoskeletal: Normal range of motion.  Neurological: He is alert.  Skin: Skin is warm.  Psychiatric: He has a normal mood and affect.     Assessment/Plan  Proceed as planned with RIGHT ureteroscopic stone manipulation. Risks, benefits, alternative, expected peri-op course discussed previously and reiterated today.   Alexis Frock, MD 01/02/2019, 10:33 AM

## 2019-01-02 NOTE — Brief Op Note (Signed)
01/02/2019  5:09 PM  PATIENT:  [name] Woods  78 y.o. male  PRE-OPERATIVE DIAGNOSIS:  RIGHT URETERAL STONE  POST-OPERATIVE DIAGNOSIS:  RIGHT URETERAL STONE  PROCEDURE:  Procedure(s) with comments: CYSTOSCOPY WITH RETROGRADE PYELOGRAM, URETEROSCOPY AND STENT PLACEMENT (Right) - 1 HR HOLMIUM LASER APPLICATION (Right)  SURGEON:  Surgeon(s) and Role:    Alexis Frock, MD - Primary  PHYSICIAN ASSISTANT:   ASSISTANTS: none   ANESTHESIA:   general  EBL:  minimal   BLOOD ADMINISTERED:none  DRAINS: none   LOCAL MEDICATIONS USED:  NONE  SPECIMEN:  Source of Specimen:  right ureteral / renal stone fragments  DISPOSITION OF SPECIMEN:  [facility] for compositional analysis  COUNTS:  YES  TOURNIQUET:  * No tourniquets in log *  DICTATION: .Other Dictation: Dictation Number [id number]  PLAN OF CARE: Discharge to home after PACU  PATIENT DISPOSITION:  PACU - hemodynamically stable.   Delay start of Pharmacological VTE agent (>24hrs) due to surgical blood loss or risk of bleeding: yes

## 2019-01-02 NOTE — Anesthesia Preprocedure Evaluation (Addendum)
Anesthesia Evaluation  Patient identified by MRN, date of birth, ID band Patient awake    Reviewed: Allergy & Precautions, NPO status , Patient's Chart, lab work & pertinent test results, reviewed documented beta blocker date and time   History of Anesthesia Complications Negative for: history of anesthetic complications  Airway Mallampati: II  TM Distance: >3 FB Neck ROM: Full    Dental  (+) Poor Dentition, Missing,    Pulmonary sleep apnea , COPD,    Pulmonary exam normal        Cardiovascular hypertension, Pt. on medications and Pt. on home beta blockers + CAD  Normal cardiovascular exam+ dysrhythmias Atrial Fibrillation   TTE 03/2016: EF 55-60%, mild AR, mild LAE    Neuro/Psych Anxiety Depression negative neurological ROS     GI/Hepatic Neg liver ROS, hiatal hernia, GERD  ,  Endo/Other  negative endocrine ROS  Renal/GU negative Renal ROS  negative genitourinary   Musculoskeletal negative musculoskeletal ROS (+)   Abdominal   Peds  Hematology negative hematology ROS (+)   Anesthesia Other Findings Day of surgery medications reviewed with patient.  Reproductive/Obstetrics negative OB ROS                            Anesthesia Physical Anesthesia Plan  ASA: II  Anesthesia Plan: General   Post-op Pain Management:    Induction: Intravenous  PONV Risk Score and Plan: 2 and Treatment may vary due to age or medical condition, Ondansetron and Dexamethasone  Airway Management Planned: Oral ETT  Additional Equipment:   Intra-op Plan:   Post-operative Plan: Extubation in OR  Informed Consent: I have reviewed the patients History and Physical, chart, labs and discussed the procedure including the risks, benefits and alternatives for the proposed anesthesia with the patient or authorized representative who has indicated his/her understanding and acceptance.     Dental advisory  given  Plan Discussed with: CRNA  Anesthesia Plan Comments:        Anesthesia Quick Evaluation

## 2019-01-02 NOTE — Transfer of Care (Signed)
Immediate Anesthesia Transfer of Care Note  Patient: [name] Woods  Procedure(s) Performed: CYSTOSCOPY WITH RETROGRADE PYELOGRAM, URETEROSCOPY AND STENT PLACEMENT (Right ) HOLMIUM LASER APPLICATION (Right )  Patient Location: PACU  Anesthesia Type:General  Level of Consciousness: awake and alert   Airway & Oxygen Therapy: Patient Spontanous Breathing and Patient connected to face mask oxygen  Post-op Assessment: Report given to RN and Post -op Vital signs reviewed and stable  Post vital signs: Reviewed and stable  Last Vitals:  Vitals Value Taken Time  BP 128/77 01/02/19 1726  Temp    Pulse 69 01/02/19 1729  Resp 16 01/02/19 1729  SpO2 99 % 01/02/19 1729  Vitals shown include unvalidated device data.  Last Pain:  Vitals:   01/02/19 1726  TempSrc:   PainSc: (P) 0-No pain         Complications: No apparent anesthesia complications

## 2019-01-02 NOTE — Anesthesia Procedure Notes (Signed)
Date/Time: 01/02/2019 5:17 PM Performed by: Cynda Familia, CRNA Oxygen Delivery Method: Simple face mask Placement Confirmation: positive ETCO2 and breath sounds checked- equal and bilateral Dental Injury: Teeth and Oropharynx as per pre-operative assessment

## 2019-01-02 NOTE — Anesthesia Procedure Notes (Signed)
Procedure Name: Intubation Date/Time: 01/02/2019 4:13 PM Performed by: Cynda Familia, CRNA Pre-anesthesia Checklist: Patient identified, Emergency Drugs available, Suction available and Patient being monitored Patient Re-evaluated:Patient Re-evaluated prior to induction Oxygen Delivery Method: Circle System Utilized Preoxygenation: Pre-oxygenation with 100% oxygen Induction Type: IV induction Ventilation: Mask ventilation without difficulty Laryngoscope Size: Miller and 2 Grade View: Grade I Tube type: Oral Tube size: 7.5 mm Number of attempts: 1 Airway Equipment and Method: Stylet Placement Confirmation: ETT inserted through vocal cords under direct vision,  positive ETCO2 and breath sounds checked- equal and bilateral Secured at: 22 cm Tube secured with: Tape Dental Injury: Teeth and Oropharynx as per pre-operative assessment  Comments: Smooth RSI by [facility]-- intubation AM CRNA atraumatic-- pt with very poor dentition-- many missing broken chipped teeth-- teeth and mouth unchanged with laryngoscopy-- bilat BS [place]

## 2019-01-02 NOTE — Discharge Instructions (Signed)
1 - You may have urinary urgency (bladder spasms) and bloody urine on / off with stent in place. This is normal.  2 - Remove tethered stent on Monday morning at home by pulling on string, then blue-white plastic tubing, and discarding. Office is open Monday if problems arise.   3 - Call MD or go to ER for fever >102, severe pain / nausea / vomiting not relieved by medications, or acute change in medical status

## 2019-01-02 NOTE — Anesthesia Postprocedure Evaluation (Signed)
Anesthesia Post Note  Patient: [name] Woods  Procedure(s) Performed: CYSTOSCOPY WITH RETROGRADE PYELOGRAM, URETEROSCOPY AND STENT PLACEMENT (Right ) HOLMIUM LASER APPLICATION (Right )     Patient location during evaluation: PACU Anesthesia Type: General Level of consciousness: awake and alert and oriented Pain management: pain level controlled Vital Signs Assessment: post-procedure vital signs reviewed and stable Respiratory status: spontaneous breathing, nonlabored ventilation and respiratory function stable Cardiovascular status: blood pressure returned to baseline Postop Assessment: no apparent nausea or vomiting Anesthetic complications: no    Last Vitals:  Vitals:   01/02/19 1745 01/02/19 1800  BP: (!) 128/56 124/86  Pulse: 73 71  Resp: 20 20  Temp:  (!) 36.3 C  SpO2: 96% 97%    Last Pain:  Vitals:   01/02/19 1800  TempSrc:   PainSc: 0-No pain                 Brennan Bailey

## 2019-01-03 ENCOUNTER — Encounter (HOSPITAL_COMMUNITY): Payer: Self-pay | Admitting: Urology

## 2019-01-05 DIAGNOSIS — N201 Calculus of ureter: Secondary | ICD-10-CM | POA: Diagnosis not present

## 2019-01-05 NOTE — Op Note (Signed)
NAME: [name] Woods, ALWARDT MEDICAL RECORD [id number] ACCOUNT [id number] DATE OF BIRTH:December 31, 1940 FACILITY: [facility] LOCATION: [facility] PHYSICIAN:Jalonda Antigua Tresa Moore, MD  OPERATIVE REPORT  DATE OF PROCEDURE:  01/02/2019  PREOPERATIVE DIAGNOSIS:  Right ureteral stone.  PROCEDURE: 1.  Cystoscopy, retrograde pyelogram, interpretation. 2.  Right ureteroscopy with laser lithotripsy. 3.  Insertion of right ureteral stent 5 x 26 Polaris with tether.    ESTIMATED BLOOD LOSS:  Nil.  COMPLICATIONS:  None.  SPECIMENS:  Right ureteral stones for analysis.  FINDINGS: 1.  Right mid ureteral stone. 2.  No evidence of right intraluminal renal stones. 3.  Successful placement of right ureteral stent proximal end curled in the renal pelvis, distal end in urinary bladder.  INDICATIONS:  The patient is a pleasant 78 year old man with history of recurrent urolithiasis.  He was found on evaluation of right flank pain to have recurrent right mid ureteral stone.  His pain was not severe.  No infectious parameters at the time  and options were discussed and he wished to proceed with elective right ureteroscopy, presents for this today.  Informed consent was then placed and obtained in medical record.  DESCRIPTION OF PROCEDURE:  Patient being identified as patient, procedure being right ureteroscopic stimulation was confirmed.  Procedure timeout was performed.  Anesthesia general anesthesia induced.  The patient was placed into a low lithotomy  position, sterile field was created prepped and draped base of the penis, perineum and proximal thighs using iodine.  Cystourethroscopy was performed using a 21-French rigid cystoscope.  The right ureter was cannulated with a 6-French catheter and right  pyelogram was obtained.  Right pyelogram does show single right ureter single system right kidney inducing hydronephrosis.  A 0.03 ZIPwire into the lower pole advanced to the level of the upper pole and set aside as a  safety wire.  An 8-French placed in the urinary bladder for  pressure release, and semirigid ureteroscopy was performed at distal right ureter alongside a separate sensor working wire.  At the upper reaches of the semi-rigid scope, the ureteral stone in question was seen in the midureter.  It was much too large  for simple basketing.  Given at the very high reach of the semirigid scope it was felt that a more efficient technique would be sheath and flexible scope.  As such, the semirigid scope was exchanged for 12-14 medium length ureteral access sheath to the  level of the mid ureter.  Exquisite care not to pass the sheath proximal to the visualized portion and flexible digital ureteroscopy was performed using a right mid ureter, relaxed and placement of the sheath approximately 2 cm below the stone.  Holmium  laser energy applied 70 setting of 0.2 joules and 20 Hz.  Approximately 80% of stone volume was ablated.  The remaining 50% fragmented and the escape basket removed.  It was fragmented aside for analysis.  In ureteroscopy of the proximal ureter revealed  no distal calcifications.  There was some question of right papillary tip calcification based on prior CT and as a goal today was to render stone free.  The right kidney was inspected, including all calices x3.  There is no evidence of intraluminal  stones seen whatsoever.  It is however, likely prior was calcification represented at a submucosal stone versus stone and also migrated into the ureter and had already been removed.  The access sheath was then removed under continuous vision, no mucosal  abnormalities were found, and a new 5 x 26 Polaris-type stent was placed  as a safety wire using fluoroscopic guidance.  Good pulses a plane were noted.  The procedure was terminated.  The patient tolerated the procedure well.  No immediate complications.   The patient taken to postanesthesia care in stable condition with plan for discharge  home.  CN/NUANCE  D:01/02/2019 T:01/03/2019 JOB:[id number]

## 2019-01-08 DIAGNOSIS — N201 Calculus of ureter: Secondary | ICD-10-CM | POA: Diagnosis not present

## 2019-01-08 MED FILL — CIPROFLOXACIN HCL 500 MG TA: 500 | 7 days supply | Qty: 14 | Fill #0

## 2019-01-13 ENCOUNTER — Telehealth: Payer: Self-pay | Admitting: Cardiology

## 2019-01-13 NOTE — Telephone Encounter (Signed)
New Message ° ° °Patient calling the office for samples of medication: ° ° °1.  What medication and dosage are you requesting samples for?apixaban (ELIQUIS) 5 MG TABS tablet  ° °2.  Are you currently out of this medication? Yes  ° ° ° °

## 2019-01-13 NOTE — Telephone Encounter (Signed)
3 weeks worth of samples left at front desk .Adonis Housekeeper

## 2019-01-19 ENCOUNTER — Other Ambulatory Visit: Payer: Self-pay | Admitting: Physician Assistant

## 2019-01-21 ENCOUNTER — Telehealth: Payer: Self-pay | Admitting: *Deleted

## 2019-01-21 MED FILL — HYDROCODON-APAP 10-325: 10-325 | 19 days supply | Qty: 75 | Fill #0

## 2019-01-21 NOTE — Telephone Encounter (Signed)
Refill sent.

## 2019-01-21 NOTE — Telephone Encounter (Signed)
.  Indication for chronic opioid: Ankylosing Spondylitis, OA of multiple sites, OA Right knee Medication and dose: Hydrocodone 10-325 mg # pills per month: 75 Last UDS date: 03/07/18 Opioid Treatment Agreement signed (Y/N): Yes, 07/24/17 Opioid Treatment Agreement last reviewed with patient:   NCCSRS reviewed this encounter (include red flags):

## 2019-01-21 NOTE — Telephone Encounter (Signed)
Pt requesting Rx Hydrocodone  Stated still with pain  Pt used [facility]

## 2019-02-12 DIAGNOSIS — Z7189 Other specified counseling: Secondary | ICD-10-CM | POA: Insufficient documentation

## 2019-02-12 DIAGNOSIS — I482 Chronic atrial fibrillation, unspecified: Secondary | ICD-10-CM | POA: Insufficient documentation

## 2019-02-12 NOTE — Progress Notes (Signed)
Cardiology Office Note   Date:  02/13/2019   ID:  [name] Woods, DOB 10/16/1940, MRN [id number]  PCP:  Brunetta Jeans, PA-C  Cardiologist:   Minus Breeding, MD   Chief Complaint  Patient presents with  . Atrial Fibrillation      History of Present Illness: [name] Woods is a 79 y.o. male who with chronic atrial fibrillation, minimal CAD and hypertension.  He had a cardiac catheterization on 03/24/2012 which showed very mild nonobstructive CAD.  EF was mildly reduced to 45% the time.  Repeat echocardiogram in June 2015 showed an EF improved to 55%.  ETT in March 2018 showed ST segment depressions in inferolateral leads, poor exercise tolerance.  Echocardiogram in March 2018 showed EF 55 to 60%, mild AI, small posterior pericardial effusion.  He did have 2 episode of presyncope and was seen in September 2018, it was decided to watch and wait to see if he has any more episodes.  His diltiazem was increased at the time.   Since we last talked to him he was in the hospital for procedure to remove kidney stones.  He had hematuria.  His Eliquis was interrupted twice for that.  I do note that his creatinine was 1.77.  He has had no follow-up labs.  He feels okay.  He still doing some heating and air work.  He denies any chest discomfort, neck or arm discomfort.  He has no new shortness of breath, PND or orthopnea.  He does not really notice his atrial fibrillation.  He has had no weight gain or edema.   Past Medical History:  Diagnosis Date  . Ankylosing spondylitis ([place])   . Aortic atherosclerosis ([place])   . Arthritis   . Bilateral inguinal hernia   . CAD (coronary artery disease) 4818,5631   30% mid LAD lesion on cardiac cath  . Chicken pox   . Cholelithiasis   . Diverticulosis 11/26/2018   Moderate, Left Noted on Colonoscopy  . Dysrhythmia    atrial fibrillation  . Gallstones   . GERD (gastroesophageal reflux disease)   . [place] measles   . Hiatal hernia   . History  of colon polyps 11/26/2018  . History of kidney stones   . HLD (hyperlipidemia)   . HTN (hypertension)   . Hypertension   . Nephrolithiasis   . Persistent atrial fibrillation ([place])   . Sleep apnea    No CPAP  . 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 Ventricular hypertrophy     Past Surgical History:  Procedure Laterality Date  . CARDIAC CATHETERIZATION  03/2012   30% mid LAD lesion otherwise normal cors, LVEF 65%  . CHOLECYSTECTOMY    . COLONOSCOPY  11/26/2018  . CYSTOSCOPY WITH RETROGRADE PYELOGRAM, URETEROSCOPY AND STENT PLACEMENT Right 01/02/2019   Procedure: CYSTOSCOPY WITH RETROGRADE PYELOGRAM, URETEROSCOPY AND STENT PLACEMENT;  Surgeon: Alexis Frock, MD;  Location: [facility];  Service: Urology;  Laterality: Right;  1 HR  . ELBOW ARTHROPLASTY Left   . HAND SURGERY Left   . HOLMIUM LASER APPLICATION Right 49/07/261   Procedure: HOLMIUM LASER APPLICATION;  Surgeon: Alexis Frock, MD;  Location: [facility];  Service: Urology;  Laterality: Right;  . KNEE SURGERY     x2 right  . LEFT AND RIGHT HEART CATHETERIZATION WITH CORONARY ANGIOGRAM N/A 03/24/2012   Procedure: LEFT AND RIGHT HEART CATHETERIZATION WITH CORONARY ANGIOGRAM;  Surgeon: Sherren Mocha, MD;  Location[facility];  Service: Cardiovascular;  Laterality: N/A;  .  LEG FLUID REMOVAL RIGHT    . TONSILLECTOMY    . UPPER GI ENDOSCOPY       Current Outpatient Medications  Medication Sig Dispense Refill  . allopurinol (ZYLOPRIM) 100 MG tablet TAKE 1 TABLET EVERY DAY 90 tablet 1  . apixaban (ELIQUIS) 5 MG TABS tablet Take 1 tablet (5 mg total) by mouth 2 (two) times daily. 30 tablet 0  . atorvastatin (LIPITOR) 10 MG tablet TAKE 1 TABLET EVERY DAY 90 tablet 1  . cyclobenzaprine (FLEXERIL) 10 MG tablet Take 1 tablet (10 mg total) by mouth 3 (three) times daily as needed for muscle spasms. 15 tablet 0  . diltiazem (CARDIZEM CD) 240 MG 24 hr capsule TAKE 1 CAPSULE EVERY DAY (DUE FOR APPOINTMENT FOR FUTURE REFILLS) (Patient taking  differently: Take 240 mg by mouth daily. ) 90 capsule 1  . furosemide (LASIX) 20 MG tablet TAKE 1 TABLET EVERY DAY (Patient taking differently: Take 20 mg by mouth daily. ) 90 tablet 0  . HYDROcodone-acetaminophen (NORCO) 10-325 MG tablet TAKE 1 TABLET BY MOUTH EVERY 6 HOURS AS NEEDED. 75 tablet 0  . loperamide (IMODIUM) 2 MG capsule Take 2-4 mg by mouth 4 (four) times daily as needed for diarrhea or loose stools.    . metoprolol succinate (TOPROL-XL) 100 MG 24 hr tablet TAKE 1 TABLET  TWO TIMES DAILY  WITH OR IMMEDIATELY FOLLOWING A MEAL. (Patient taking differently: Take 100 mg by mouth 2 (two) times daily. ) 180 tablet 1  . ondansetron (ZOFRAN) 4 MG tablet Take 4 mg by mouth every 8 (eight) hours as needed for nausea/vomiting.    . sildenafil (REVATIO) 20 MG tablet TAKE 1 TABLET BY MOUTH AS NEEDED FOR ERECTILE DYSFUNCTION. DO NO TAKE MORE THAN 1 DOSE IN 36 HOURS. (Patient taking differently: Take 20 mg by mouth daily as needed (erectile dysfunction.). ) 30 tablet 0  . tamsulosin (FLOMAX) 0.4 MG CAPS capsule Take 0.4 mg by mouth at bedtime.     No current facility-administered medications for this visit.    Allergies:   Coumadin [warfarin]    ROS:  Please see the history of present illness.   Otherwise, review of systems are positive for none.   All other systems are reviewed and negative.    PHYSICAL EXAM: VS:  BP 126/84   Pulse (!) 56   Ht 6' (1.829 m)   Wt 215 lb 12.8 oz (97.9 kg)   BMI 29.27 kg/m  , BMI Body mass index is 29.27 kg/m. GENERAL:  Well appearing NECK:  No jugular venous distention, waveform within normal limits, carotid upstroke brisk and symmetric, no bruits, no thyromegaly LUNGS:  Clear to auscultation bilaterally CHEST:  Unremarkable HEART:  PMI not displaced or sustained,S1 and S2 within normal limits, no S3, no clicks, no rubs, no murmurs, irregular ABD:  Flat, positive bowel sounds normal in frequency in pitch, no bruits, no rebound, no guarding, no midline  pulsatile mass, no hepatomegaly, no splenomegaly EXT:  2 plus pulses throughout, no edema, no cyanosis no clubbing    EKG:  EKG is not ordered today.   Recent Labs: 12/24/2018: BUN 23; Creat 1.77; Potassium 4.4; Sodium 141 01/02/2019: Hemoglobin 17.0; Platelets 224    Lipid Panel    Component Value Date/Time   CHOL 124 07/22/2017 0945   TRIG 77 07/22/2017 0945   HDL 37 (L) 07/22/2017 0945   CHOLHDL 3.4 07/22/2017 0945   CHOLHDL 4 03/02/2016 1016   VLDL 20.8 03/02/2016 1016   LDLCALC 72  07/22/2017 0945      Wt Readings from Last 3 Encounters:  02/13/19 215 lb 12.8 oz (97.9 kg)  01/02/19 209 lb (94.8 kg)  12/24/18 210 lb (95.3 kg)      Other studies Reviewed: Additional studies/ records that were reviewed today include: Labs. Review of the above records demonstrates:  Please see elsewhere in the note.     ASSESSMENT AND PLAN:  CAD:    The patient has no new sypmtoms.  No further cardiovascular testing is indicated.  We will continue with aggressive risk reduction and meds as listed.he had minimal coronary disease on previous cath.  No further work-up.  Chronic atrial fibrillation:   He tolerates anticoagulation or rate control.  I will check a CBC.   AKI: Needs follow-up be met.  I suspect this was related to obstructive uropathy.  I will order a basic metabolic profile today.    Covid education: He has signed up to get the vaccine.  Current medicines are reviewed at length with the patient today.  The patient does not have concerns regarding medicines.  The following changes have been made:  no change  Labs/ tests ordered today include:   Orders Placed This Encounter  Procedures  . CBC  . Basic Metabolic Panel (BMET)     Disposition:   FU with me in one year.     Signed, Minus Breeding, MD  02/13/2019 1:00 PM    [place]

## 2019-02-13 ENCOUNTER — Other Ambulatory Visit: Payer: Self-pay

## 2019-02-13 ENCOUNTER — Encounter: Payer: Self-pay | Admitting: Cardiology

## 2019-02-13 ENCOUNTER — Ambulatory Visit (INDEPENDENT_AMBULATORY_CARE_PROVIDER_SITE_OTHER): Payer: Medicare HMO | Admitting: Cardiology

## 2019-02-13 VITALS — BP 126/84 | HR 56 | Ht 72.0 in | Wt 215.8 lb

## 2019-02-13 DIAGNOSIS — Z7901 Long term (current) use of anticoagulants: Secondary | ICD-10-CM | POA: Diagnosis not present

## 2019-02-13 DIAGNOSIS — I482 Chronic atrial fibrillation, unspecified: Secondary | ICD-10-CM

## 2019-02-13 DIAGNOSIS — I2510 Atherosclerotic heart disease of native coronary artery without angina pectoris: Secondary | ICD-10-CM

## 2019-02-13 DIAGNOSIS — N179 Acute kidney failure, unspecified: Secondary | ICD-10-CM | POA: Diagnosis not present

## 2019-02-13 DIAGNOSIS — I10 Essential (primary) hypertension: Secondary | ICD-10-CM

## 2019-02-13 DIAGNOSIS — Z7189 Other specified counseling: Secondary | ICD-10-CM

## 2019-02-13 NOTE — Patient Instructions (Signed)
Medication Instructions:  No changes *If you need a refill on your cardiac medications before your next appointment, please call your pharmacy*  Lab Work: Your physician recommends that you return for lab work today (CBC, BMP) If you have labs (blood work) drawn today and your tests are completely normal, you will receive your results only by: Marland Kitchen MyChart Message (if you have MyChart) OR . A paper copy in the mail If you have any lab test that is abnormal or we need to change your treatment, we will call you to review the results.  Testing/Procedures: None  Follow-Up: At [facility], you and your health needs are our priority.  As part of our continuing mission to provide you with exceptional heart care, we have created designated Provider Care Teams.  These Care Teams include your primary Cardiologist (physician) and Advanced Practice Providers (APPs -  Physician Assistants and Nurse Practitioners) who all work together to provide you with the care you need, when you need it.  Your next appointment:   1 year(s)  You will receive a reminder letter in the mail two months in advance. If you don't receive a letter, please call our office to schedule the follow-up appointment.   The format for your next appointment:   In Person  Provider:   Dr. Percival Spanish

## 2019-02-14 LAB — BASIC METABOLIC PANEL
BUN/Creatinine Ratio: 14 (ref 10–24)
BUN: 17 mg/dL (ref 8–27)
CO2: 27 mmol/L (ref 20–29)
Calcium: 9.9 mg/dL (ref 8.6–10.2)
Chloride: 102 mmol/L (ref 96–106)
Creatinine, Ser: 1.21 mg/dL (ref 0.76–1.27)
GFR calc Af Amer: 66 mL/min/{1.73_m2} (ref >59)
GFR calc non Af Amer: 57 mL/min/{1.73_m2} — ABNORMAL LOW (ref >59)
Glucose: 73 mg/dL (ref 65–99)
Potassium: 4.7 mmol/L (ref 3.5–5.2)
Sodium: 144 mmol/L (ref 134–144)

## 2019-02-14 LAB — CBC
Hematocrit: 50.3 % (ref 37.5–51.0)
Hemoglobin: 17 g/dL (ref 13.0–17.7)
MCH: 30.2 pg (ref 26.6–33.0)
MCHC: 33.8 g/dL (ref 31.5–35.7)
MCV: 89 fL (ref 79–97)
Platelets: 209 10*3/uL (ref 150–450)
RBC: 5.63 x10E6/uL (ref 4.14–5.80)
RDW: 13.5 % (ref 11.6–15.4)
WBC: 10.2 10*3/uL (ref 3.4–10.8)

## 2019-02-19 ENCOUNTER — Other Ambulatory Visit: Payer: Self-pay | Admitting: Physician Assistant

## 2019-02-19 NOTE — Telephone Encounter (Signed)
Requesting refill Rx Hydrocodone #75 Last Rx RF on 01/21/2019 Last OV 12/24/2018

## 2019-02-20 ENCOUNTER — Other Ambulatory Visit: Payer: Self-pay | Admitting: Cardiology

## 2019-02-20 MED FILL — HYDROCODON-APAP 10-325: 10-325 | 18 days supply | Qty: 75 | Fill #0

## 2019-02-26 ENCOUNTER — Other Ambulatory Visit: Payer: Self-pay

## 2019-02-26 MED ORDER — FUROSEMIDE 20 MG PO TABS: 20.0000 mg | ORAL_TABLET | Freq: Every day | ORAL | 3 refills | Status: DC

## 2019-02-27 ENCOUNTER — Other Ambulatory Visit: Payer: Self-pay

## 2019-02-27 ENCOUNTER — Ambulatory Visit: Payer: Medicare HMO

## 2019-02-27 NOTE — Telephone Encounter (Signed)
Refill already done. 

## 2019-03-03 ENCOUNTER — Telehealth: Payer: Self-pay | Admitting: Cardiology

## 2019-03-03 NOTE — Telephone Encounter (Signed)
Patient calling the office for samples of medication:   1.  What medication and dosage are you requesting samples for? apixaban (ELIQUIS) 5 MG TABS tablet  2.  Are you currently out of this medication? No, has a few left

## 2019-03-03 NOTE — Telephone Encounter (Signed)
Called and notified patient of samples, placed up front for patient-  No other questions or concerns.

## 2019-03-07 ENCOUNTER — Ambulatory Visit: Payer: Medicare HMO | Attending: Internal Medicine

## 2019-03-07 DIAGNOSIS — Z23 Encounter for immunization: Secondary | ICD-10-CM | POA: Insufficient documentation

## 2019-03-07 NOTE — Progress Notes (Signed)
   Covid-19 Vaccination Clinic  Name:  ZAREN DUSCH    MRN: [id number] DOB: 07/02/1940  03/07/2019  Mr. Necessary was observed post Covid-19 immunization for 15 minutes without incidence. He was provided with Vaccine Information Sheet and instruction to access the V-Safe system.   Mr. Carradine was instructed to call 911 with any severe reactions post vaccine: Marland Kitchen Difficulty breathing  . Swelling of your face and throat  . A fast heartbeat  . A bad rash all over your body  . Dizziness and weakness    Immunizations Administered    Name Date Dose VIS Date Route   Pfizer COVID-19 Vaccine 03/07/2019  2:43 PM 0.3 mL 01/09/2019 Intramuscular   Manufacturer: [place]   Lot: [id number]   [place]: [id number]

## 2019-03-11 ENCOUNTER — Other Ambulatory Visit: Payer: Self-pay | Admitting: Cardiology

## 2019-03-11 MED FILL — ELIQUIS 5 MG TABLET: 5 | 7 days supply | Qty: 14 | Fill #0

## 2019-03-18 MED FILL — ELIQUIS 5 MG TABLET: 5 | 7 days supply | Qty: 14 | Fill #1

## 2019-03-24 ENCOUNTER — Other Ambulatory Visit: Payer: Self-pay | Admitting: Physician Assistant

## 2019-03-24 MED FILL — HYDROCODON-APAP 10-325: 10-325 | 19 days supply | Qty: 75 | Fill #0

## 2019-03-24 NOTE — Telephone Encounter (Signed)
Indication for chronic opioid:  Ankylosing Spondylitis, OA multiple sites, ES OA R knee Medication and dose: Hydrocodone 10-325 mg # pills per month: 75 Last UDS date: 03/07/18 Opioid Treatment Agreement signed (Y/N): 07/24/2017 Opioid Treatment Agreement last reviewed with patient:   NCCSRS reviewed this encounter (include red flags):    LOV 12/24/2018 for gross hematuria Please advise.

## 2019-03-27 ENCOUNTER — Ambulatory Visit (INDEPENDENT_AMBULATORY_CARE_PROVIDER_SITE_OTHER): Payer: Medicare HMO | Admitting: Physician Assistant

## 2019-03-27 ENCOUNTER — Other Ambulatory Visit: Payer: Self-pay

## 2019-03-27 ENCOUNTER — Encounter: Payer: Self-pay | Admitting: Physician Assistant

## 2019-03-27 VITALS — BP 127/68 | HR 70 | Temp 96.7°F | Wt 217.0 lb

## 2019-03-27 DIAGNOSIS — G8929 Other chronic pain: Secondary | ICD-10-CM

## 2019-03-27 NOTE — Progress Notes (Signed)
Virtual Visit via Video Note  I connected with [name] Woods on 03/27/19 at  9:30 AM EST by a video enabled telemedicine application and verified that I am speaking with the correct person using two identifiers.  Location: Patient: Home Provider: [facility]   I discussed the limitations of evaluation and management by telemedicine and the availability of in person appointments. The patient expressed understanding and agreed to proceed.  History of Present Illness: Reviewed and updated  today  Indication for chronic opioid: ankylosing spondylitis Medication and dose: Norco 10-325mg  # pills per month: 75 Last UDS date: 03/2018 (Due) Pain contract signed (date): 03/2018 (Due) Date narcotic database last reviewed (include red flags): today. No red flags  Pain Inventory (1-10 worse): Average Pain 8 Pain Right Now 4 My pain is constant and mainly dull (character i.e. sharp, stabbing, dull, constant etc)  Pain is worse with: walking, weightbearing, knee extension Relief from Meds: Fair  In the last 24 hours, has pain interfered with the following (1-10 greatest interference) ? General activity 9 Relation with others 1 Enjoyment of life 9 What TIME of day is your pain at its worst? Throughout day       Sleep (in general) overall good  Mobility/Function: Assistance device: n/a How many minutes can you walk? 15-20 min Ability to climb steps?  Y but has to take one step at a time Do you drive? Y  Neuro/Psych Sx: (bladder, bowel, weakness, dizziness, depression etc) none Prior Studies: See EMR Physicians involved in your care: Any changes since last visit?  none -- still followed by [facility]. Gets injections periodically. Replacement recommended. He is not ready for this.   Observations/Objective: Patient is well-developed, well-nourished in no acute distress.  Resting comfortably at home.  Head is normocephalic, atraumatic.  No labored breathing.   Speech is clear and coherent with logical content.  Patient is alert and oriented at baseline.   Assessment and Plan: 1. Encounter for chronic pain management CSC and UDS due for update. This is scheduled. [place] printed and ready for signature. Continue current regimen. OTC medications reviewed. Follow-up with Ortho as scheduled. Discussed if any further deterioration and he is not proceeding with treatment by Ortho (TKR) will be referred to pain management.    Follow Up Instructions: I discussed the assessment and treatment plan with the patient. The patient was provided an opportunity to ask questions and all were answered. The patient agreed with the plan and demonstrated an understanding of the instructions.   The patient was advised to call back or seek an in-person evaluation if the symptoms worsen or if the condition fails to improve as anticipated.  I provided 10 minutes of non-face-to-face time during this encounter.   Leeanne Rio, PA-C

## 2019-03-27 NOTE — Progress Notes (Signed)
I have discussed the procedure for the virtual visit with the patient who has given consent to proceed with assessment and treatment.   Solara Goodchild S Fidelis Loth, CMA     

## 2019-04-01 ENCOUNTER — Ambulatory Visit: Payer: Medicare HMO | Attending: Internal Medicine

## 2019-04-01 ENCOUNTER — Other Ambulatory Visit: Payer: Self-pay

## 2019-04-01 ENCOUNTER — Ambulatory Visit (INDEPENDENT_AMBULATORY_CARE_PROVIDER_SITE_OTHER): Payer: Medicare HMO

## 2019-04-01 ENCOUNTER — Encounter: Payer: Self-pay | Admitting: Physician Assistant

## 2019-04-01 DIAGNOSIS — G8929 Other chronic pain: Secondary | ICD-10-CM

## 2019-04-01 DIAGNOSIS — Z23 Encounter for immunization: Secondary | ICD-10-CM | POA: Insufficient documentation

## 2019-04-01 NOTE — Progress Notes (Signed)
   Covid-19 Vaccination Clinic  Name:  [name] Woods    MRN: [id number] DOB: [birth year]  04/01/2019  [name] was observed post Covid-19 immunization for 15 minutes without incident. He was provided with Vaccine Information Sheet and instruction to access the V-Safe system.   [name] was instructed to call 911 with any severe reactions post vaccine: Marland Kitchen Difficulty breathing  . Swelling of face and throat  . A fast heartbeat  . A bad rash all over body  . Dizziness and weakness   Immunizations Administered    Name Date Dose VIS Date Route   Pfizer COVID-19 Vaccine 04/01/2019 11:01 AM 0.3 mL 01/09/2019 Intramuscular   Manufacturer: [place]   Lot: [id number]   [place]: [id number]

## 2019-04-03 LAB — PAIN MGMT, PROFILE 8 W/CONF, U
6 Acetylmorphine: NEGATIVE ng/mL
Alcohol Metabolites: NEGATIVE ng/mL (ref <500)
Amphetamines: NEGATIVE ng/mL
Benzodiazepines: NEGATIVE ng/mL
Buprenorphine, Urine: NEGATIVE ng/mL
Cocaine Metabolite: NEGATIVE ng/mL
Codeine: NEGATIVE ng/mL
Creatinine: 129.4 mg/dL
Hydrocodone: 2485 ng/mL
Hydromorphone: NEGATIVE ng/mL
MDMA: NEGATIVE ng/mL
Marijuana Metabolite: NEGATIVE ng/mL
Morphine: NEGATIVE ng/mL
Norhydrocodone: 1051 ng/mL
Opiates: POSITIVE ng/mL
Oxidant: NEGATIVE ug/mL
Oxycodone: NEGATIVE ng/mL
pH: 5.4 (ref 4.5–9.0)

## 2019-04-03 MED FILL — ELIQUIS 5 MG TABLET: 5 | 7 days supply | Qty: 14 | Fill #2

## 2019-04-16 MED FILL — ELIQUIS 5 MG TABLET: 5 | 7 days supply | Qty: 14 | Fill #3

## 2019-04-20 ENCOUNTER — Other Ambulatory Visit: Payer: Self-pay | Admitting: Cardiology

## 2019-04-20 ENCOUNTER — Other Ambulatory Visit: Payer: Self-pay | Admitting: Family Medicine

## 2019-04-20 MED FILL — HYDROCODON-APAP 10-325: 10-325 | 19 days supply | Qty: 75 | Fill #0

## 2019-04-20 NOTE — Telephone Encounter (Signed)
Indication for chronic opioid: Ankylosing Spondylitis Medication and dose: Hydrocodone-APAP 10/325  # pills per month: 75 on 03/24/19 Last UDS date: 04/01/19 Opioid Treatment Agreement signed (Y/N): Y, 04/01/19 Opioid Treatment Agreement last reviewed with patient:   NCCSRS reviewed this encounter (include red flags):     LOV: 2/26/221 Chronic pain

## 2019-04-28 ENCOUNTER — Other Ambulatory Visit: Payer: Self-pay | Admitting: Cardiology

## 2019-04-29 ENCOUNTER — Other Ambulatory Visit: Payer: Self-pay | Admitting: Cardiology

## 2019-04-29 MED FILL — ELIQUIS 5 MG TABLET: 5 | 14 days supply | Qty: 28 | Fill #0

## 2019-05-06 ENCOUNTER — Other Ambulatory Visit: Payer: Self-pay | Admitting: Physician Assistant

## 2019-05-12 ENCOUNTER — Telehealth: Payer: Self-pay | Admitting: Physician Assistant

## 2019-05-12 NOTE — Progress Notes (Signed)
  Chronic Care Management   Note  05/12/2019 Name: AADITYA SCHACHT MRN: [id number] DOB: [birth year]  CHOZEN RAPUANO is a 79 y.o. year old male who is a primary care patient of Delorse Limber. I reached out to [name] by phone today in response to a referral sent by Mr. Mckenzie Nudd Kennerly's PCP, Brunetta Jeans, PA-C.   Mr. Thesing was given information about Chronic Care Management services today including:  1. CCM service includes personalized support from designated clinical staff supervised by his physician, including individualized plan of care and coordination with other care providers 2. 24/7 contact phone numbers for assistance for urgent and routine care needs. 3. Service will only be billed when office clinical staff spend 20 minutes or more in a month to coordinate care. 4. Only one practitioner may furnish and bill the service in a calendar month. 5. The patient may stop CCM services at any time (effective at the end of the month) by phone call to the office staff.   Patient agreed to services and verbal consent obtained.   Follow up plan:   Earney [place] [employer] Scheduler

## 2019-05-15 MED FILL — ELIQUIS 5 MG TABLET: 5 | 14 days supply | Qty: 28 | Fill #1

## 2019-05-21 ENCOUNTER — Other Ambulatory Visit: Payer: Self-pay | Admitting: Physician Assistant

## 2019-05-21 DIAGNOSIS — M459 Ankylosing spondylitis of unspecified sites in spine: Secondary | ICD-10-CM

## 2019-05-21 MED ORDER — HYDROCODONE-ACETAMINOPHEN 10-325 MG PO TABS: 1.0000 | ORAL_TABLET | Freq: Four times a day (QID) | ORAL | 0 refills | Status: DC | PRN

## 2019-05-21 MED FILL — HYDROCODON-APAP 10-325: 10-325 | 19 days supply | Qty: 75 | Fill #0

## 2019-05-21 NOTE — Telephone Encounter (Signed)
Indication for chronic opioid: Ankylosing Spondylitis Medication and dose: Norco 10-325mg  # pills per month: 75 on 04/20/19 Last UDS date: 04/01/19 Opioid Treatment Agreement signed (Y/N): Yes on 04/01/19 Opioid Treatment Agreement last reviewed with patient:   NCCSRS reviewed this encounter (include red flags):     LOV: 03/27/19 Chronic pain

## 2019-05-21 NOTE — Telephone Encounter (Signed)
Pt called in asking for a refill on the Hydrocodone, please advise

## 2019-05-22 ENCOUNTER — Other Ambulatory Visit: Payer: Self-pay | Admitting: General Practice

## 2019-05-22 DIAGNOSIS — I2510 Atherosclerotic heart disease of native coronary artery without angina pectoris: Secondary | ICD-10-CM

## 2019-05-22 DIAGNOSIS — I480 Paroxysmal atrial fibrillation: Secondary | ICD-10-CM

## 2019-05-22 DIAGNOSIS — I10 Essential (primary) hypertension: Secondary | ICD-10-CM

## 2019-06-02 ENCOUNTER — Telehealth (INDEPENDENT_AMBULATORY_CARE_PROVIDER_SITE_OTHER): Payer: Medicare HMO | Admitting: Physician Assistant

## 2019-06-02 ENCOUNTER — Other Ambulatory Visit: Payer: Self-pay

## 2019-06-02 ENCOUNTER — Encounter: Payer: Self-pay | Admitting: Physician Assistant

## 2019-06-02 VITALS — BP 101/67 | HR 68 | Temp 97.3°F

## 2019-06-02 DIAGNOSIS — M545 Low back pain, unspecified: Secondary | ICD-10-CM

## 2019-06-02 MED ORDER — CYCLOBENZAPRINE HCL 5 MG PO TABS: 5.0000 mg | ORAL_TABLET | Freq: Three times a day (TID) | ORAL | 0 refills | Status: DC | PRN

## 2019-06-02 MED FILL — CYCLOBENZAPRINE HCL 5 MG TA: 5 | 5 days supply | Qty: 15 | Fill #0

## 2019-06-02 MED FILL — ELIQUIS 5 MG TABLET: 5 | 7 days supply | Qty: 14 | Fill #2

## 2019-06-02 NOTE — Patient Instructions (Signed)
Instructions sent to MyChart

## 2019-06-02 NOTE — Progress Notes (Signed)
Virtual Visit via Video   I connected with patient on 06/02/19 at 10:00 AM EDT by a video enabled telemedicine application and verified that I am speaking with the correct person using two identifiers.  Location patient: Home Location provider: [facility], Office Persons participating in the virtual visit: Patient, Provider, [place] (Patina Moore)  I discussed the limitations of evaluation and management by telemedicine and the availability of in person appointments. The patient expressed understanding and agreed to proceed.  Subjective:   HPI:   Patient presents via [place] today c/o flare up of back pain after working on his car and doing yard work yesterday. Notes he twisted his lower back as he was working but did not note any significant pain at the time. Notes pain is in the lower back bilaterally, does not radiate, is aggravated with ROM, alleviated with pain medication and laying down. Pain is achy and with movement becomes sharp. Has continued his chronic pain medication and has added on some OTC tylenol. Pain 5 at rest but higher with movement.   ROS:   See pertinent positives and negatives per HPI.  Patient Active Problem List   Diagnosis Date Noted  . Chronic atrial fibrillation ([place]) 02/12/2019  . Educated about COVID-19 virus infection 02/12/2019  . Kidney stones, calcium oxalate 12/03/2018  . Hyperlipidemia 02/06/2018  . Encounter for chronic pain management 03/01/2017  . Abnormal stress test 05/10/2016  . Elevated coronary artery calcium score 05/10/2016  . Hyperglycemia 04/23/2016  . Anxiety and depression 09/12/2015  . History of colonic polyps 03/02/2015  . Chronic anticoagulation 03/02/2015  . Multiple lung nodules 10/22/2014  . Lung nodule, multiple 08/24/2014  . Emphysema of lung ([place]) 07/28/2014  . Gout 06/30/2014  . Paroxysmal atrial fibrillation ([place]) 03/21/2012  . CAD (coronary artery disease) 03/21/2012  . GERD (gastroesophageal reflux  disease) 03/21/2012  . COLONIC POLYPS, ADENOMATOUS 04/25/2007  . Essential hypertension 04/25/2007  . VENTRICULAR HYPERTROPHY, LEFT 04/25/2007  . HIATAL HERNIA 04/25/2007  . ARTHRITIS 04/25/2007  . SLEEP APNEA 04/25/2007  . CHOLELITHIASIS, HX OF 04/25/2007  . NEPHROLITHIASIS, HX OF 04/25/2007  . Ankylosing spondylitis ([place]) 09/04/2006    Social History   Tobacco Use  . Smoking status: Never Smoker  . Smokeless tobacco: Never Used  Substance Use Topics  . Alcohol use: No    Current Outpatient Medications:  .  allopurinol (ZYLOPRIM) 100 MG tablet, TAKE 1 TABLET EVERY DAY, Disp: 90 tablet, Rfl: 1 .  atorvastatin (LIPITOR) 10 MG tablet, TAKE 1 TABLET EVERY DAY, Disp: 90 tablet, Rfl: 1 .  cyclobenzaprine (FLEXERIL) 10 MG tablet, Take 1 tablet (10 mg total) by mouth 3 (three) times daily as needed for muscle spasms., Disp: 15 tablet, Rfl: 0 .  diltiazem (CARDIZEM CD) 240 MG 24 hr capsule, TAKE 1 CAPSULE EVERY DAY, Disp: 90 capsule, Rfl: 3 .  ELIQUIS 5 MG TABS tablet, TAKE 1 TABLET BY MOUTH 2 TIMES DAILY., Disp: 180 tablet, Rfl: 1 .  furosemide (LASIX) 20 MG tablet, Take 1 tablet (20 mg total) by mouth daily., Disp: 90 tablet, Rfl: 3 .  HYDROcodone-acetaminophen (NORCO) 10-325 MG tablet, Take 1 tablet by mouth every 6 (six) hours as needed., Disp: 75 tablet, Rfl: 0 .  metoprolol succinate (TOPROL-XL) 100 MG 24 hr tablet, TAKE 1 TABLET  TWO TIMES DAILY  WITH OR IMMEDIATELY FOLLOWING A MEAL., Disp: 180 tablet, Rfl: 3 .  sildenafil (REVATIO) 20 MG tablet, TAKE 1 TABLET BY MOUTH AS NEEDED FOR ERECTILE DYSFUNCTION. DO NO TAKE  MORE THAN 1 DOSE IN 36 HOURS. (Patient taking differently: Take 20 mg by mouth daily as needed (erectile dysfunction.). ), Disp: 30 tablet, Rfl: 0 .  tamsulosin (FLOMAX) 0.4 MG CAPS capsule, Take 0.4 mg by mouth at bedtime., Disp: , Rfl:  .  ondansetron (ZOFRAN) 4 MG tablet, Take 4 mg by mouth every 8 (eight) hours as needed for nausea/vomiting., Disp: , Rfl:   Allergies    Allergen Reactions  . Coumadin [Warfarin] Other (See Comments)    GI bleeds    Objective:   There were no vitals taken for this visit.  Patient is well-developed, well-nourished in no acute distress.  Resting comfortably at home.  Head is normocephalic, atraumatic.  No labored breathing.  Speech is clear and coherent with logical content.  Patient is alert and oriented at baseline.   Assessment and Plan:   1. Acute bilateral low back pain without sciatica Atraumatic.  No alarm signs or symptoms present.  We will have him continue his chronic pain medication as directed, using Tylenol in between doses if needed.  Rx short course of Flexeril 5 mg nightly to help with muscle tension.  Supportive measures and other OTC medications reviewed with patient.  Follow-up if symptoms or not improving, anything worsens or new symptoms develop.  Patient voiced understanding and agreement with the plan.  Written copy of instructions was sent to patient's MyChart.    Leeanne Rio, PA-C 06/02/2019

## 2019-06-02 NOTE — Progress Notes (Signed)
I have discussed the procedure for the virtual visit with the patient who has given consent to proceed with assessment and treatment.   Per Beagley N Cara Thaxton, LPN    . 

## 2019-06-08 ENCOUNTER — Ambulatory Visit: Payer: Medicare HMO

## 2019-06-08 ENCOUNTER — Telehealth: Payer: Self-pay

## 2019-06-08 NOTE — Progress Notes (Unsigned)
Chronic Care Management Pharmacy  Name: [name] Woods  MRN: [id number] DOB: [birth year]  Chief Complaint/ HPI  [name] Woods,  79 y.o. , male presents for their Initial CCM visit with the clinical pharmacist via telephone due to COVID-19 Pandemic.  PCP : [name] Jeans, PA-C  Their chronic conditions include: HTN, HLD, CAD, Afib, sleep apnea   Office Visits: 5/4 (PCP): back pain without sciatica; given trial of Flexeril 5 mg TID to help with muscle tension.  2/26 (PCP): pain mgmt; Ankylosing Spondylitis, OA multiple sites, ES OA R knee. Norco 10-325 Q6H.  Consult Visit:*** 1/5 (Dr. Percival Spanish, Cardiology): reports minimal CAD on most recent cath. Patient does not have any symptoms of Afib. Recent kidney stone, possible obstructive nephropathy - scr 12/4 was 1.77. scr 1.21 1/15 lab.   Outpatient Encounter Medications as of 06/08/2019  Medication Sig  . allopurinol (ZYLOPRIM) 100 MG tablet TAKE 1 TABLET EVERY DAY  . atorvastatin (LIPITOR) 10 MG tablet TAKE 1 TABLET EVERY DAY  . cyclobenzaprine (FLEXERIL) 5 MG tablet Take 1 tablet (5 mg total) by mouth 3 (three) times daily as needed for muscle spasms.  Marland Kitchen diltiazem (CARDIZEM CD) 240 MG 24 hr capsule TAKE 1 CAPSULE EVERY DAY  . ELIQUIS 5 MG TABS tablet TAKE 1 TABLET BY MOUTH 2 TIMES DAILY.  . furosemide (LASIX) 20 MG tablet Take 1 tablet (20 mg total) by mouth daily.  Marland Kitchen HYDROcodone-acetaminophen (NORCO) 10-325 MG tablet Take 1 tablet by mouth every 6 (six) hours as needed.  . metoprolol succinate (TOPROL-XL) 100 MG 24 hr tablet TAKE 1 TABLET  TWO TIMES DAILY  WITH OR IMMEDIATELY FOLLOWING A MEAL.  Marland Kitchen ondansetron (ZOFRAN) 4 MG tablet Take 4 mg by mouth every 8 (eight) hours as needed for nausea/vomiting.  . sildenafil (REVATIO) 20 MG tablet TAKE 1 TABLET BY MOUTH AS NEEDED FOR ERECTILE DYSFUNCTION. DO NO TAKE MORE THAN 1 DOSE IN 36 HOURS. (Patient taking differently: Take 20 mg by mouth daily as needed (erectile dysfunction.). )    . tamsulosin (FLOMAX) 0.4 MG CAPS capsule Take 0.4 mg by mouth at bedtime.   No facility-administered encounter medications on file as of 06/08/2019.   Current Diagnosis/Assessment: Goals Addressed   None    Hypertension / Afib   BP today is:  {CHL HP UPSTREAM Pharmacist BP ranges:[phone]}  Office blood pressures are  BP Readings from Last 3 Encounters:  06/02/19 101/67  03/27/19 127/68  02/13/19 126/84   Pulse Readings from Last 3 Encounters:  06/02/19 68  03/27/19 70  02/13/19 (!) 56   Blood pressure consistently <130/80s. *** lows, feeling dizzy, chest pain, headache. Taking:   Diltiazem 240 mg CD once daily   Metoprolol succinate 100 mg once daily   Furosemide 20 mg daily   Patient is currently rate controlled.  HR <70 BPM consistently 01/2019 to 05/2019. Taking the following for rate control:  Diltiazem 240 mg CD once daily   Metoprolol succinate 100 mg once daily  *** dizziness, SOB, chest pain.  Also taking Eliquis 5 mg twice daily for prevention of stroke in Afib.  *** any abnormal bruising, bleeding from nose or gums or blood in urine or stool.  We discussed:  ***  Plan  Continue current medications   Hyperlipidemia   Lipid Panel     Component Value Date/Time   CHOL 124 07/22/2017 0945   TRIG 77 07/22/2017 0945   HDL 37 (L) 07/22/2017 0945   CHOLHDL 3.4 07/22/2017 0945  CHOLHDL 4 03/02/2016 1016   VLDL 20.8 03/02/2016 1016   LDLCALC 72 07/22/2017 0945   LABVLDL 15 07/22/2017 0945     The ASCVD Risk score (Goff DC Jr., et al., 2013) failed to calculate for the following reasons:   The valid total cholesterol range is 130 to 320 mg/dL   Patient has failed these meds in past: *** Patient is currently controlled on the following medications: atorvastatin 10 mg daily.   We discussed:  {CHL HP Upstream Pharmacy discussion:[phone]}  Plan  Continue {CHL HP Upstream Pharmacy [phone]}  Pain   Patient is currently {CHL  Controlled/Uncontrolled:[phone]} on the following medications:   Norco 10-325 mg Q6H as needed  Cyclobenzaprine 5 mg every 8 hours as needed for muscle spasms   We discussed:  ***  Plan  Continue {CHL HP Upstream Pharmacy [id number]   BPH    Lab Results  Component Value Date   PSA 3.21 09/22/2014   PSA 1.76 11/12/2011   PSA 2.15 09/10/2008   Patient is currently controlled on tamsulosin 0.4 mg once daily.  Denies urinary symptoms urinary hesitancy, urinary frequency, incomplete voiding. Denies side effects including dizziness.  Plan  Continue current medications.  ED   Patient has failed these meds in past: *** Patient is currently controlled on the following medications: sildenafil 20 mg once daily as needed.   We discussed:  ***  Plan  Continue {CHL HP Upstream Pharmacy [id number]   Vaccines   Reviewed and discussed patient's vaccination history.    Immunization History  Administered Date(s) Administered  . Fluad Quad(high Dose 65+) 11/17/2018  . Influenza Split 11/30/2011  . Influenza Whole 11/10/2009  . Influenza, High Dose Seasonal PF 12/02/2012, 11/07/2013, 11/26/2017  . Influenza,inj,Quad PF,6+ Mos 10/25/2014, 11/08/2015, 10/31/2016  . PFIZER SARS-COV-2 Vaccination 03/07/2019, 04/01/2019  . Pneumococcal Conjugate-13 05/03/2014  . Pneumococcal Polysaccharide-23 03/02/2016  . Td 01/30/2004  . Tdap 05/03/2014   Plan  Recommended patient receive *** vaccine in *** office/pharmacy.   Medication Management   Pt uses *** pharmacy for all medications Uses pill box? {Yes or If no, why not?:20788} Pt endorses ***% compliance  We discussed: ***  Plan  {US Pharmacy [id number]    Follow up: *** month phone visit  *** ______________ Visit Information SDOH (Social Determinants of Health) assessments performed: {testsdoh:23840::"Yes."}  [name] Woods was given information about Chronic Care Management services today  including:  1. CCM service includes personalized support from designated clinical staff supervised by his physician, including individualized plan of care and coordination with other care providers 2. 24/7 contact phone numbers for assistance for urgent and routine care needs. 3. Standard insurance, coinsurance, copays and deductibles apply for chronic care management only during months in which we provide at least 20 minutes of these services. Most insurances cover these services at 100%, however patients may be responsible for any copay, coinsurance and/or deductible if applicable. This service may help you avoid the need for more expensive face-to-face services. 4. Only one practitioner may furnish and bill the service in a calendar month. 5. The patient may stop CCM services at any time (effective at the end of the month) by phone call to the office staff.  Patient agreed to services and verbal consent obtained.   Madelin Rear, Pharm.D., BCGP Clinical Pharmacist [facility] at [facility] [phone]

## 2019-06-08 NOTE — Telephone Encounter (Signed)
  Chronic Care Management   Outreach Note  06/08/2019 Name: [name] Woods MRN: [id number] DOB: 28-Jul-1940  Referred by: Brunetta Jeans, PA-C Reason for referral: Telephone Appointment with Pharmacist, Madelin Rear    An unsuccessful telephone outreach was attempted today. The patient was referred to the pharmacist for assistance with care management and care coordination. If patient returns call, please provide number below to reschedule visit.   Madelin Rear, Pharm.D., BCGP Clinical Pharmacist [facility] at [facility] [phone]

## 2019-06-15 ENCOUNTER — Other Ambulatory Visit: Payer: Self-pay | Admitting: Physician Assistant

## 2019-06-17 ENCOUNTER — Telehealth: Payer: Self-pay | Admitting: Physician Assistant

## 2019-06-17 MED FILL — ELIQUIS 5 MG TABLET: 5 | 14 days supply | Qty: 28 | Fill #3

## 2019-06-17 NOTE — Progress Notes (Signed)
  Chronic Care Management   Outreach Note  06/17/2019 Name: [name] Woods MRN: [id number] DOB: 12/12/1940  Referred by: Brunetta Jeans, PA-C Reason for referral : No chief complaint on file.   An unsuccessful telephone outreach was attempted today. The patient was referred to the pharmacist for assistance with care management and care coordination.   This note is not being shared with the patient for the following reason: To respect privacy (The patient or proxy has requested that the information not be shared).  Follow Up Plan:   Earney Hamburg Upstream Scheduler

## 2019-06-18 ENCOUNTER — Other Ambulatory Visit: Payer: Self-pay | Admitting: Emergency Medicine

## 2019-06-18 DIAGNOSIS — M459 Ankylosing spondylitis of unspecified sites in spine: Secondary | ICD-10-CM

## 2019-06-18 MED ORDER — HYDROCODONE-ACETAMINOPHEN 10-325 MG PO TABS: 1.0000 | ORAL_TABLET | Freq: Four times a day (QID) | ORAL | 0 refills | Status: DC | PRN

## 2019-06-18 MED FILL — HYDROCODON-APAP 10-325: 10-325 | 19 days supply | Qty: 75 | Fill #0

## 2019-06-18 NOTE — Telephone Encounter (Signed)
Indication for chronic opioid: Ankylosing Spondylitis Medication and dose: Norco 10-325 mg # pills per month: 75 on 05/21/19 Last UDS date: 04/01/2019 Opioid Treatment Agreement signed (Y/N): Yes 04/01/19 Opioid Treatment Agreement last reviewed with patient:   NCCSRS reviewed this encounter (include red flags):     LOV: 06/02/19 Acute back pain

## 2019-06-18 NOTE — Telephone Encounter (Signed)
PDMP reviewed. No red flags. Refill sent.

## 2019-06-25 ENCOUNTER — Telehealth: Payer: Self-pay | Admitting: Physician Assistant

## 2019-06-25 NOTE — Progress Notes (Signed)
  Chronic Care Management   Outreach Note  06/25/2019 Name: [name] Woods MRN: [id number] DOB: 08-Feb-1940  Referred by: Brunetta Jeans, PA-C Reason for referral : No chief complaint on file.   A second unsuccessful telephone outreach was attempted today. The patient was referred to pharmacist for assistance with care management and care coordination.  This note is not being shared with the patient for the following reason: To respect privacy (The patient or proxy has requested that the information not be shared).  Follow Up Plan:   Earney Hamburg Upstream Scheduler

## 2019-07-07 ENCOUNTER — Telehealth: Payer: Self-pay | Admitting: Physician Assistant

## 2019-07-07 NOTE — Telephone Encounter (Signed)
Left message for patient to schedule Annual Wellness Visit.  Please schedule with Nurse Health Advisor Martha Stanley, RN at [facility]  

## 2019-07-15 ENCOUNTER — Other Ambulatory Visit: Payer: Self-pay | Admitting: Physician Assistant

## 2019-07-15 DIAGNOSIS — M459 Ankylosing spondylitis of unspecified sites in spine: Secondary | ICD-10-CM

## 2019-07-15 MED FILL — ELIQUIS 5 MG TABLET: 5 | 14 days supply | Qty: 28 | Fill #4

## 2019-07-15 NOTE — Telephone Encounter (Signed)
Hydrocodone-acetaminophen 10/325 LFD 06/18/19 #75 with 0 refills LOV 06/02/19 NOV none

## 2019-07-16 MED FILL — HYDROCODON-APAP 10-325: 10-325 | 19 days supply | Qty: 75 | Fill #0

## 2019-08-12 ENCOUNTER — Other Ambulatory Visit: Payer: Self-pay | Admitting: Physician Assistant

## 2019-08-12 DIAGNOSIS — M459 Ankylosing spondylitis of unspecified sites in spine: Secondary | ICD-10-CM

## 2019-08-12 MED FILL — ELIQUIS 5 MG TABLET: 5 | 10 days supply | Qty: 20 | Fill #5

## 2019-08-12 NOTE — Telephone Encounter (Signed)
Indication for chronic opioid: Ankylosing Spondylisitis  Medication and dose: Hydrocodone-APAP 10/325 mg # pills per month: 75 monthly on 07/16/19 Last UDS date: 04/01/19  Opioid Treatment Agreement signed (Y/N): Yes 04/01/19 Opioid Treatment Agreement last reviewed with patient:   NCCSRS reviewed this encounter (include red flags):     LOV: 03/27/19 Chronic pain -pt is due for follow up every 3 months

## 2019-08-17 ENCOUNTER — Ambulatory Visit (INDEPENDENT_AMBULATORY_CARE_PROVIDER_SITE_OTHER): Payer: Medicare HMO | Admitting: Physician Assistant

## 2019-08-17 ENCOUNTER — Other Ambulatory Visit: Payer: Self-pay

## 2019-08-17 ENCOUNTER — Encounter: Payer: Self-pay | Admitting: Physician Assistant

## 2019-08-17 VITALS — BP 118/60 | HR 45 | Temp 97.9°F | Resp 16 | Ht 73.0 in | Wt 211.0 lb

## 2019-08-17 DIAGNOSIS — M459 Ankylosing spondylitis of unspecified sites in spine: Secondary | ICD-10-CM

## 2019-08-17 DIAGNOSIS — Z7689 Persons encountering health services in other specified circumstances: Secondary | ICD-10-CM

## 2019-08-17 DIAGNOSIS — I480 Paroxysmal atrial fibrillation: Secondary | ICD-10-CM | POA: Diagnosis not present

## 2019-08-17 DIAGNOSIS — G8929 Other chronic pain: Secondary | ICD-10-CM

## 2019-08-17 MED ORDER — HYDROCODONE-ACETAMINOPHEN 10-325 MG PO TABS: 1.0000 | ORAL_TABLET | Freq: Four times a day (QID) | ORAL | 0 refills | Status: DC | PRN

## 2019-08-17 MED FILL — HYDROCODON-APAP 10-325: 10-325 | 19 days supply | Qty: 75 | Fill #0

## 2019-08-17 NOTE — Patient Instructions (Signed)
I will work on seeing if we can get approved for a power mobility scooter.  I have sent in medication refills for you. We will follow-up in 3 months.  Sooner if needed.  Make sure to follow-up with Orthopedics and Cardiology (Dr. Percival Spanish)

## 2019-08-17 NOTE — Progress Notes (Signed)
Reviewed and updated  today - 08/17/2019  Indication for chronic opioid: Chronic OA, AS Medication and dose: Norco 10-325 mg # pills per month: 75 Last UDS date: 04/01/19 Pain contract signed (date): 04/01/2019 Date narcotic database last reviewed (include red flags): today. No red flags.  Pain Inventory (1-10 worse): Average Pain: Typically 3-4/10 but with prolonged walking, 6-7/10 Pain Right Now 4 My pain is dull, aching and constant (character i.e. sharp, stabbing, dull, constant etc)  Pain is worse with: prolonged walking Relief from Meds: good  In the last 24 hours, has pain interfered with the following (1-10 greatest interference) ? General activity 3 Relation with others 1 Enjoyment of life 2 What TIME of day is your pain at its worst? evening       Sleep (in general) Good  Mobility/Function: Assistance device: none at present. Would like to discuss possibility of scooter for longer distanced ambulation. Is ok to ambulate very short distances. Longer ones are more challenging due to pain. Denies issue with balance. Also has issue with pain of shoulders and upper back which makes self propelling with a wheelchair difficult.  How many minutes can you walk? 5-10 Ability to climb steps?  impaired.  Do you drive? Y  Neuro/Psych Sx: (bladder, bowel, weakness, dizziness, depression etc) None Prior Studies: See EMR Physicians involved in your care: Any changes since last visit?  None  Past Medical History:  Diagnosis Date  . Ankylosing spondylitis ([place])   . Aortic atherosclerosis ([place])   . Arthritis   . Bilateral inguinal hernia   . CAD (coronary artery disease) 9983,3825   30% mid LAD lesion on cardiac cath  . Chicken pox   . Cholelithiasis   . Diverticulosis 11/26/2018   Moderate, Left Noted on Colonoscopy  . Dysrhythmia    atrial fibrillation  . Gallstones   . GERD (gastroesophageal reflux disease)   . [place] measles   . Hiatal hernia   . History of colon  polyps 11/26/2018  . History of kidney stones   . HLD (hyperlipidemia)   . HTN (hypertension)   . Hypertension   . Nephrolithiasis   . Persistent atrial fibrillation ([place])   . Sleep apnea    No CPAP  . 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 Ventricular hypertrophy     Current Outpatient Medications on File Prior to Visit  Medication Sig Dispense Refill  . allopurinol (ZYLOPRIM) 100 MG tablet TAKE 1 TABLET EVERY DAY 90 tablet 1  . atorvastatin (LIPITOR) 10 MG tablet TAKE 1 TABLET EVERY DAY 90 tablet 1  . diltiazem (CARDIZEM CD) 240 MG 24 hr capsule TAKE 1 CAPSULE EVERY DAY 90 capsule 3  . ELIQUIS 5 MG TABS tablet TAKE 1 TABLET BY MOUTH 2 TIMES DAILY. 180 tablet 1  . furosemide (LASIX) 20 MG tablet Take 1 tablet (20 mg total) by mouth daily. 90 tablet 3  . metoprolol succinate (TOPROL-XL) 100 MG 24 hr tablet TAKE 1 TABLET  TWO TIMES DAILY  WITH OR IMMEDIATELY FOLLOWING A MEAL. 180 tablet 3  . ondansetron (ZOFRAN) 4 MG tablet Take 4 mg by mouth every 8 (eight) hours as needed for nausea/vomiting.    . sildenafil (REVATIO) 20 MG tablet TAKE 1 TABLET BY MOUTH AS NEEDED FOR ERECTILE DYSFUNCTION. DO NO TAKE MORE THAN 1 DOSE IN 36 HOURS. (Patient taking differently: Take 20 mg by mouth daily as needed (erectile dysfunction.). ) 30 tablet 0  . tamsulosin (FLOMAX) 0.4 MG CAPS capsule Take 0.4 mg by  mouth at bedtime.     No current facility-administered medications on file prior to visit.    Allergies  Allergen Reactions  . Coumadin [Warfarin] Other (See Comments)    GI bleeds    Family History  Problem Relation Age of Onset  . Heart disease Father 39       Deceased  . Heart attack Father 48  . Heart disease Brother 66       "Died in his sleep"  . Arthritis/Rheumatoid Mother        Deceased-86  . Hyperlipidemia Brother   . Stroke Brother 26       Deceased  . Other Sister        Estate agent in [place]  . Other Sister        MVA  . Other Brother        Carjacked-killed  . Dementia Paternal  Grandmother   . Cancer Paternal Uncle   . Healthy Brother   . Healthy Son        X3  . Healthy Daughter        x1  . Colon cancer Neg Hx   . Esophageal cancer Neg Hx   . Stomach cancer Neg Hx   . Rectal cancer Neg Hx     Social History   Socioeconomic History  . Marital status: Married    Spouse name: Not on file  . Number of children: 4  . Years of education: Not on file  . Highest education level: Not on file  Occupational History  . 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 Smoking status: Never Smoker  . Smokeless tobacco: Never Used  Vaping Use  . Vaping Use: Never used  Substance and Sexual Activity  . Alcohol use: No  . Drug use: No  . Sexual activity: Yes  Other Topics Concern  . Not on file  Social History Narrative   4 caffeine drinks daily    Social Determinants of Health   Financial Resource Strain:   . Difficulty of Paying Living Expenses:   Food Insecurity:   . Worried About Charity fundraiser in the Last Year:   . Arboriculturist in the Last Year:   Transportation Needs:   . Film/video editor (Medical):   Marland Kitchen Lack of Transportation (Non-Medical):   Physical Activity:   . Days of Exercise per Week:   . Minutes of Exercise per Session:   Stress:   . Feeling of Stress :   Social Connections:   . Frequency of Communication with Friends and Family:   . Frequency of Social Gatherings with Friends and Family:   . Attends Religious Services:   . Active Member of Clubs or Organizations:   . Attends Archivist Meetings:   Marland Kitchen Marital Status:    Review of Systems - See HPI.  All other ROS are negative.  BP 118/60   Pulse (!) 45   Temp 97.9 F (36.6 C) (Temporal)   Resp 16   Ht 6\' 1"  (1.854 m)   Wt 211 lb (95.7 kg)   BMI 27.84 kg/m   Physical Exam Vitals reviewed.  Constitutional:      Appearance: Normal appearance.  HENT:     Head: Normocephalic and atraumatic.  Cardiovascular:       Rate and Rhythm: Normal rate and regular rhythm.     Heart sounds: Normal heart sounds.  Pulmonary:  Effort: Pulmonary effort is normal.     Breath sounds: Normal breath sounds.  Musculoskeletal:     Cervical back: Neck supple.  Neurological:     General: No focal deficit present.     Mental Status: He is alert and oriented to person, place, and time.  Psychiatric:        Mood and Affect: Mood normal.    Assessment/Plan: 1. Encounter for chronic pain management 2. Ankylosing spondylitis, unspecified site of spine ([place]) Stable. CSC and UDS up-to-date. PDMP reviewed. No red flags. Medications refilled. Follow-up every 3 months.  - HYDROcodone-acetaminophen (NORCO) 10-325 MG tablet; Take 1 tablet by mouth every 6 (six) hours as needed.  Dispense: 75 tablet; Refill: 0  3. Paroxysmal atrial fibrillation (HCC) Rate controlled. Slight bradycardia but paroxysmal. Patient to follow-up with Cardiology.   4. Encounter for power mobility device assessment Discussed with patient that giving he has some ability to propel himself in a wheelchair I am not sure if insurance will cover the power mobility scooter. However I do feel it is a reasonable device for him to use for longer distance traveling as ambulation is significantly impaired secondary to pain. Will attempt to get this covered for patient.   This visit occurred during the SARS-CoV-2 public health emergency.  Safety protocols were in place, including screening questions prior to the visit, additional usage of staff PPE, and extensive cleaning of exam room while observing appropriate contact time as indicated for disinfecting solutions.     Leeanne Rio, PA-C

## 2019-08-20 ENCOUNTER — Encounter (HOSPITAL_COMMUNITY): Payer: Self-pay | Admitting: Emergency Medicine

## 2019-08-20 ENCOUNTER — Ambulatory Visit (HOSPITAL_COMMUNITY)
Admission: EM | Admit: 2019-08-20 | Discharge: 2019-08-20 | Disposition: A | Discharge status: Home / Self Care | Payer: Medicare HMO | Attending: Urgent Care | Admitting: Urgent Care

## 2019-08-20 ENCOUNTER — Ambulatory Visit (INDEPENDENT_AMBULATORY_CARE_PROVIDER_SITE_OTHER): Payer: Medicare HMO

## 2019-08-20 ENCOUNTER — Other Ambulatory Visit: Payer: Self-pay

## 2019-08-20 DIAGNOSIS — S4992XA Unspecified injury of left shoulder and upper arm, initial encounter: Secondary | ICD-10-CM | POA: Diagnosis not present

## 2019-08-20 DIAGNOSIS — I10 Essential (primary) hypertension: Secondary | ICD-10-CM

## 2019-08-20 DIAGNOSIS — R030 Elevated blood-pressure reading, without diagnosis of hypertension: Secondary | ICD-10-CM

## 2019-08-20 DIAGNOSIS — S46912A Strain of unspecified muscle, fascia and tendon at shoulder and upper arm level, left arm, initial encounter: Secondary | ICD-10-CM

## 2019-08-20 DIAGNOSIS — I482 Chronic atrial fibrillation, unspecified: Secondary | ICD-10-CM

## 2019-08-20 DIAGNOSIS — M25512 Pain in left shoulder: Secondary | ICD-10-CM

## 2019-08-20 MED ORDER — METHOCARBAMOL 500 MG PO TABS: 500.0000 mg | ORAL_TABLET | Freq: Three times a day (TID) | ORAL | 0 refills | Status: DC | PRN

## 2019-08-20 MED ORDER — ACETAMINOPHEN 325 MG PO TABS
ORAL_TABLET | ORAL | Status: AC
  Filled 2019-08-20: qty 2

## 2019-08-20 MED ORDER — METHYLPREDNISOLONE SODIUM SUCC 125 MG IJ SOLR
125.0000 mg | Freq: Once | INTRAMUSCULAR | Status: AC
  Administered 2019-08-20: 125 mg via INTRAMUSCULAR

## 2019-08-20 MED ORDER — METHYLPREDNISOLONE SODIUM SUCC 125 MG IJ SOLR
INTRAMUSCULAR | Status: AC
  Filled 2019-08-20: qty 2

## 2019-08-20 MED ORDER — ACETAMINOPHEN 325 MG PO TABS
650.0000 mg | ORAL_TABLET | Freq: Once | ORAL | Status: AC
  Administered 2019-08-20: 650 mg via ORAL

## 2019-08-20 MED FILL — METHOCARBAMOL 500 MG TABS: 500 | 10 days supply | Qty: 30 | Fill #0

## 2019-08-20 NOTE — ED Triage Notes (Addendum)
Pt c/o left shoulder pain onset Tuesday evening. He states he was carrying and end table and bumped into the wall and twisted his body. He immediately felt shoulder pain but loss grip in the right hand and all the table weight was carried by the left hand. He denies any previous injuries to that shoulder. He does take blood thinners. He states he has limited range of motion in that arm and cannot lift above him head.

## 2019-08-20 NOTE — Discharge Instructions (Signed)
Please make sure that you follow-up with your orthopedist as soon as possible as you can have a soft tissue injury of your shoulder.  Today the x-ray came out normal and you will receive a steroid injection.  I want you to continue using your chronic pain medication, can also use a muscle relaxant which I will prescribe to you today.

## 2019-08-20 NOTE — ED Provider Notes (Signed)
[place]   MRN: [id number] DOB: Jul 16, 1940  Subjective:   [name] Woods is a 79 y.o. male presenting for 2 day hx of persistent severe left shoulder pain, decreased range of motion from twisting his arm 2 days ago.  Patient says he was try to move the table and the table ended up colliding with something twisting his left arm without relief.  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  Takes hydrocodone chronically for this, last dose was last night.  Also has a history of essential hypertension, atrial fibrillation.  Echocardiogram from 04/16/2016 showed ejection fraction of 55 to 60%.  Takes Eliquis, furosemide and diltiazem. forgot to take his blood pressure medication this morning.  Denies headache, confusion, vision change, weakness on one side of the body, hematuria, chest pain, shortness of breath, heart racing.  No current facility-administered medications for this encounter.  Current Outpatient Medications:  .  allopurinol (ZYLOPRIM) 100 MG tablet, TAKE 1 TABLET EVERY DAY, Disp: 90 tablet, Rfl: 1 .  atorvastatin (LIPITOR) 10 MG tablet, TAKE 1 TABLET EVERY DAY, Disp: 90 tablet, Rfl: 1 .  diltiazem (CARDIZEM CD) 240 MG 24 hr capsule, TAKE 1 CAPSULE EVERY DAY, Disp: 90 capsule, Rfl: 3 .  ELIQUIS 5 MG TABS tablet, TAKE 1 TABLET BY MOUTH 2 TIMES DAILY., Disp: 180 tablet, Rfl: 1 .  furosemide (LASIX) 20 MG tablet, Take 1 tablet (20 mg total) by mouth daily., Disp: 90 tablet, Rfl: 3 .  HYDROcodone-acetaminophen (NORCO) 10-325 MG tablet, Take 1 tablet by mouth every 6 (six) hours as needed., Disp: 75 tablet, Rfl: 0 .  metoprolol succinate (TOPROL-XL) 100 MG 24 hr tablet, TAKE 1 TABLET  TWO TIMES DAILY  WITH OR IMMEDIATELY FOLLOWING A MEAL., Disp: 180 tablet, Rfl: 3 .  ondansetron (ZOFRAN) 4 MG tablet, Take 4 mg by mouth every 8 (eight) hours as needed for nausea/vomiting., Disp: , Rfl:  .  sildenafil (REVATIO) 20 MG tablet, TAKE  1 TABLET BY MOUTH AS NEEDED FOR ERECTILE DYSFUNCTION. DO NO TAKE MORE THAN 1 DOSE IN 36 HOURS. (Patient taking differently: Take 20 mg by mouth daily as needed (erectile dysfunction.). ), Disp: 30 tablet, Rfl: 0 .  tamsulosin (FLOMAX) 0.4 MG CAPS capsule, Take 0.4 mg by mouth at bedtime., Disp: , Rfl:    Allergies  Allergen Reactions  . Coumadin [Warfarin] Other (See Comments)    GI bleeds    Past Medical History:  Diagnosis Date  . Ankylosing spondylitis ([place])   . Aortic atherosclerosis ([place])   . Arthritis   . Bilateral inguinal hernia   . CAD (coronary artery disease) 6568,1275   30% mid LAD lesion on cardiac cath  . Chicken pox   . Cholelithiasis   . Diverticulosis 11/26/2018   Moderate, Left Noted on Colonoscopy  . Dysrhythmia    atrial fibrillation  . Gallstones   . GERD (gastroesophageal reflux disease)   . [place] measles   . Hiatal hernia   . History of colon polyps 11/26/2018  . History of kidney stones   . HLD (hyperlipidemia)   . HTN (hypertension)   . Hypertension   . Nephrolithiasis   . Persistent atrial fibrillation ([place])   . Sleep apnea    No CPAP  . 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 Ventricular hypertrophy      Past Surgical History:  Procedure Laterality Date  . CARDIAC CATHETERIZATION  03/2012   30% mid LAD lesion otherwise normal cors,  LVEF 65%  . CHOLECYSTECTOMY    . COLONOSCOPY  11/26/2018  . CYSTOSCOPY WITH RETROGRADE PYELOGRAM, URETEROSCOPY AND STENT PLACEMENT Right 01/02/2019   Procedure: CYSTOSCOPY WITH RETROGRADE PYELOGRAM, URETEROSCOPY AND STENT PLACEMENT;  Surgeon: Alexis Frock, MD;  Location: [facility];  Service: Urology;  Laterality: Right;  1 HR  . ELBOW ARTHROPLASTY Left   . HAND SURGERY Left   . HOLMIUM LASER APPLICATION Right 55/07/3218   Procedure: HOLMIUM LASER APPLICATION;  Surgeon: Alexis Frock, MD;  Location: [facility];  Service: Urology;  Laterality: Right;  . KNEE SURGERY     x2 right  . LEFT AND RIGHT HEART CATHETERIZATION  WITH CORONARY ANGIOGRAM N/A 03/24/2012   Procedure: LEFT AND RIGHT HEART CATHETERIZATION WITH CORONARY ANGIOGRAM;  Surgeon: Sherren Mocha, MD;  Location: [facility] C[facility] LAB;  Service: Cardiovascular;  Laterality: N/A;  . LEG FLUID REMOVAL RIGHT    . TONSILLECTOMY    . UPPER GI ENDOSCOPY      Family History  Problem Relation Age of Onset  . Heart disease Father 63       Deceased  . Heart attack Father 73  . Heart disease Brother 21       "Died in his sleep"  . Arthritis/Rheumatoid Mother        Deceased-86  . Hyperlipidemia Brother   . Stroke Brother 12       Deceased  . Other Sister        Estate agent in [place]  . Other Sister        MVA  . Other Brother        Carjacked-killed  . Dementia Paternal Grandmother   . Cancer Paternal Uncle   . Healthy Brother   . Healthy Son        X3  . Healthy Daughter        x1  . Colon cancer Neg Hx   . Esophageal cancer Neg Hx   . Stomach cancer Neg Hx   . Rectal cancer Neg Hx     Social History   Tobacco Use  . Smoking status: Never Smoker  . Smokeless tobacco: Never Used  Vaping Use  . Vaping Use: Never used  Substance Use Topics  . Alcohol use: No  . Drug use: No    ROS   Objective:   Vitals: BP (!) 192/97 (BP Location: Left Arm)   Pulse 65   Temp 97.7 F (36.5 C) (Oral)   Resp 18   SpO2 100%   BP 162/85 on recheck.   Physical Exam Constitutional:      General: He is not in acute distress.    Appearance: Normal appearance. He is well-developed. He is not ill-appearing, toxic-appearing or diaphoretic.  HENT:     Head: Normocephalic and atraumatic.     Right Ear: External ear normal.     Left Ear: External ear normal.     Nose: Nose normal.     Mouth/Throat:     Mouth: Mucous membranes are moist.     Pharynx: Oropharynx is clear.  Eyes:     General: No scleral icterus.    Extraocular Movements: Extraocular movements intact.     Pupils: Pupils are equal, round, and reactive to light.  Cardiovascular:     Rate and  Rhythm: Normal rate and regular rhythm.     Heart sounds: Normal heart sounds. No murmur heard.  No friction rub. No gallop.   Pulmonary:     Effort: Pulmonary effort is normal. No respiratory  distress.     Breath sounds: Normal breath sounds. No stridor. No wheezing, rhonchi or rales.  Musculoskeletal:     Left shoulder: Tenderness (mild, lateral inferior deltoid) present. No swelling, deformity, effusion, laceration, bony tenderness or crepitus. Decreased range of motion (very limited active, excellent passive ROM). Normal strength.     Cervical back: No swelling, edema, deformity, erythema, signs of trauma, lacerations, rigidity, spasms, torticollis, tenderness, bony tenderness or crepitus. No pain with movement. Normal range of motion.  Skin:    General: Skin is warm and dry.     Findings: No bruising or rash.  Neurological:     Mental Status: He is alert and oriented to person, place, and time.     Cranial Nerves: No cranial nerve deficit.     Motor: No weakness.     Coordination: Coordination normal.     Gait: Gait normal.     Deep Tendon Reflexes: Reflexes normal.  Psychiatric:        Mood and Affect: Mood normal.        Behavior: Behavior normal.        Thought Content: Thought content normal.        Judgment: Judgment normal.    DG Shoulder Left  Result Date: 08/20/2019 CLINICAL DATA:  Recent injury with left shoulder pain, initial encounter EXAM: LEFT SHOULDER - 2+ VIEW COMPARISON:  None. FINDINGS: There is no evidence of fracture or dislocation. There is no evidence of arthropathy or other focal bone abnormality. Soft tissues are unremarkable. IMPRESSION: No acute abnormality noted. Electronically Signed   By: Inez Catalina M.D.   On: 08/20/2019 13:58    Assessment and Plan :   PDMP not reviewed this encounter.  1. Acute pain of left shoulder   2. Strain of left shoulder, initial encounter   3. Essential hypertension   4. Elevated blood pressure reading   5. Atrial  fibrillation, chronic (HCC)     IM Solumedrol in clinic for pain associated with shoulder strain.  Use chronic pain medications, add Robaxin.  BP recheck was better. No signs of stroke, ACS. Recommended close follow up with PCP and ortho.  Discussed possibility of soft tissue injury including labrum, rotator cuff warranting further imaging like an ultrasound or MRI which can be obtained through his orthopedist.  Counseled patient on potential for adverse effects with medications prescribed/recommended today, ER and return-to-clinic precautions discussed, patient verbalized understanding.    Jaynee Eagles, [place] 08/20/19 1411

## 2019-09-01 MED FILL — ELIQUIS 5 MG TABLET: 5 | 15 days supply | Qty: 30 | Fill #6

## 2019-09-16 ENCOUNTER — Other Ambulatory Visit: Payer: Self-pay | Admitting: Physician Assistant

## 2019-09-16 DIAGNOSIS — M459 Ankylosing spondylitis of unspecified sites in spine: Secondary | ICD-10-CM

## 2019-09-17 MED FILL — HYDROCODON-APAP 10-325: 10-325 | 19 days supply | Qty: 75 | Fill #0

## 2019-09-22 MED FILL — ELIQUIS 5 MG TABLET: 5 | 15 days supply | Qty: 30 | Fill #7

## 2019-10-16 ENCOUNTER — Other Ambulatory Visit: Payer: Self-pay | Admitting: Physician Assistant

## 2019-10-16 DIAGNOSIS — M459 Ankylosing spondylitis of unspecified sites in spine: Secondary | ICD-10-CM

## 2019-10-16 MED FILL — HYDROCODON-APAP 10-325: 10-325 | 19 days supply | Qty: 75 | Fill #0

## 2019-10-16 NOTE — Telephone Encounter (Signed)
Indication for chronic opioid: Chronic OA, AS Medication and dose: Norco 10-325 mg # pills per month: #75 on 09/17/19 Last UDS date: 04/01/2019 Opioid Treatment Agreement signed (Y/N): Yes on 04/01/2019 Opioid Treatment Agreement last reviewed with patient:   NCCSRS reviewed this encounter (include red flags):     LOV: 08/17/19 Chronic pain

## 2019-10-29 ENCOUNTER — Other Ambulatory Visit: Payer: Self-pay

## 2019-10-29 ENCOUNTER — Encounter: Payer: Self-pay | Admitting: Physician Assistant

## 2019-10-29 ENCOUNTER — Other Ambulatory Visit: Payer: Self-pay | Admitting: Physician Assistant

## 2019-10-29 ENCOUNTER — Ambulatory Visit (INDEPENDENT_AMBULATORY_CARE_PROVIDER_SITE_OTHER): Payer: Medicare HMO | Admitting: Physician Assistant

## 2019-10-29 VITALS — BP 130/70 | HR 48 | Temp 98.1°F | Resp 17 | Ht 72.0 in | Wt 207.8 lb

## 2019-10-29 DIAGNOSIS — M62838 Other muscle spasm: Secondary | ICD-10-CM | POA: Diagnosis not present

## 2019-10-29 DIAGNOSIS — G44209 Tension-type headache, unspecified, not intractable: Secondary | ICD-10-CM

## 2019-10-29 DIAGNOSIS — Z23 Encounter for immunization: Secondary | ICD-10-CM | POA: Diagnosis not present

## 2019-10-29 MED ORDER — METHOCARBAMOL 500 MG PO TABS: 500.0000 mg | ORAL_TABLET | Freq: Three times a day (TID) | ORAL | 0 refills | Status: DC | PRN

## 2019-10-29 MED FILL — METHOCARBAMOL 500 MG TABS: 500 | 10 days supply | Qty: 30 | Fill #0

## 2019-10-29 NOTE — Progress Notes (Signed)
Patient presents to clinic today c/o couple weeks of left-sided neck and shoulder tension occasionally associated with left-sided headache describes as a tightness and sensitive skin.  Denies any trauma or injury.  Denies vision changes, photophobia or phonophobia.  Denies any decreased range of motion of neck or shoulders.  Denies symptoms of right side.  Has not taken anything for his symptoms.  Past Medical History:  Diagnosis Date   Ankylosing spondylitis ([place])    Aortic atherosclerosis (HCC)    Arthritis    Bilateral inguinal hernia    CAD (coronary artery disease) 9485,4627   30% mid LAD lesion on cardiac cath   Chicken pox    Cholelithiasis    Diverticulosis 11/26/2018   Moderate, Left Noted on Colonoscopy   Dysrhythmia    atrial fibrillation   Gallstones    GERD (gastroesophageal reflux disease)    [place] measles    Hiatal hernia    History of colon polyps 11/26/2018   History of kidney stones    HLD (hyperlipidemia)    HTN (hypertension)    Hypertension    Nephrolithiasis    Persistent atrial fibrillation (HCC)    Sleep apnea    No CPAP   Tubular adenoma of colon 03/1993   Ventricular hypertrophy     Current Outpatient Medications on File Prior to Visit  Medication Sig Dispense Refill   allopurinol (ZYLOPRIM) 100 MG tablet TAKE 1 TABLET EVERY DAY 90 tablet 1   atorvastatin (LIPITOR) 10 MG tablet TAKE 1 TABLET EVERY DAY 90 tablet 1   diltiazem (CARDIZEM CD) 240 MG 24 hr capsule TAKE 1 CAPSULE EVERY DAY 90 capsule 3   ELIQUIS 5 MG TABS tablet TAKE 1 TABLET BY MOUTH 2 TIMES DAILY. 180 tablet 1   furosemide (LASIX) 20 MG tablet Take 1 tablet (20 mg total) by mouth daily. 90 tablet 3   HYDROcodone-acetaminophen (NORCO) 10-325 MG tablet TAKE 1 TABLET BY MOUTH EVERY 6 HOURS AS NEEDED. 75 tablet 0   metoprolol succinate (TOPROL-XL) 100 MG 24 hr tablet TAKE 1 TABLET  TWO TIMES DAILY  WITH OR IMMEDIATELY FOLLOWING A MEAL. 180 tablet 3    ondansetron (ZOFRAN) 4 MG tablet Take 4 mg by mouth every 8 (eight) hours as needed for nausea/vomiting.     sildenafil (REVATIO) 20 MG tablet TAKE 1 TABLET BY MOUTH AS NEEDED FOR ERECTILE DYSFUNCTION. DO NO TAKE MORE THAN 1 DOSE IN 36 HOURS. (Patient taking differently: Take 20 mg by mouth daily as needed (erectile dysfunction.). ) 30 tablet 0   tamsulosin (FLOMAX) 0.4 MG CAPS capsule Take 0.4 mg by mouth at bedtime.     No current facility-administered medications on file prior to visit.    Allergies  Allergen Reactions   Coumadin [Warfarin] Other (See Comments)    GI bleeds    Family History  Problem Relation Age of Onset   Heart disease Father 51       Deceased   Heart attack Father 83   Heart disease Brother 82       "Died in his sleep"   Arthritis/Rheumatoid Mother        Deceased-86   Hyperlipidemia Brother    Stroke Brother 34       Deceased   Other Sister        Estate agent in [place]   Other Sister        MVA   Other Brother        Carjacked-killed   Dementia Paternal Grandmother  Cancer Paternal Uncle    Healthy Brother    Healthy Son        X3   Healthy Daughter        x1   Colon cancer Neg Hx    Esophageal cancer Neg Hx    Stomach cancer Neg Hx    Rectal cancer Neg Hx     Social History   Socioeconomic History   Marital status: Married    Spouse name: Not on file   Number of children: 4   Years of education: Not on file   Highest education level: Not on file  Occupational History   Occupation: Retired    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  Tobacco Use   Smoking status: Never Smoker   Smokeless tobacco: Never Used  Scientific laboratory technician Use: Never used  Substance and Sexual Activity   Alcohol use: No   Drug use: No   Sexual activity: Yes  Other Topics Concern   Not on file  Social History Narrative   4 caffeine drinks daily    Social Determinants of Health   Financial Resource Strain:    Difficulty  of Paying Living Expenses: Not on file  Food Insecurity:    Worried About Charity fundraiser in the Last Year: Not on file   [place] in the Last Year: Not on file  Transportation Needs:    Lack of Transportation (Medical): Not on file   Lack of Transportation (Non-Medical): Not on file  Physical Activity:    Days of Exercise per Week: Not on file   Minutes of Exercise per Session: Not on file  Stress:    Feeling of Stress : Not on file  Social Connections:    Frequency of Communication with Friends and Family: Not on file   Frequency of Social Gatherings with Friends and Family: Not on file   Attends Religious Services: Not on file   Active Member of Clubs or Organizations: Not on file   Attends Archivist Meetings: Not on file   Marital Status: Not on file   Review of Systems - See HPI.  All other ROS are negative.  BP 130/70    Pulse (!) 48    Temp 98.1 F (36.7 C) (Temporal)    Resp 17    Ht 6' (1.829 m)    Wt 207 lb 12.8 oz (94.3 kg)    SpO2 98%    BMI 28.18 kg/m   Physical Exam Vitals reviewed.  Constitutional:      Appearance: He is well-developed.  HENT:     Head: Normocephalic and atraumatic.  Pulmonary:     Effort: Pulmonary effort is normal.  Musculoskeletal:     Cervical back: Spasms and tenderness present. No swelling, rigidity, torticollis or bony tenderness. Muscular tenderness (L trapezius with notable tension and knotting) present. Normal range of motion.  Neurological:     Mental Status: He is alert.    Assessment/Plan: 1. Tension headache 2. Trapezius muscle spasm/tension Examination without bony tenderness or decreased range of motion.  Significant trapezius tension, especially medially and in the occipital region with noted spasming.  Neurological examination unremarkable.  Supportive measures and OTC medications reviewed with patient.  Recommend heating pad to the area for 10 minutes 3 times daily.  Rx Robaxin.  If not  improving/resolving, recommend physical therapy evaluation and management. - methocarbamol (ROBAXIN) 500 MG tablet; Take 1 tablet (500 mg total) by  mouth every 8 (eight) hours as needed for muscle spasms.  Dispense: 30 tablet; Refill: 0  3. Need for immunization against influenza - Flu Vaccine QUAD High Dose(Fluad)  This visit occurred during the SARS-CoV-2 public health emergency.  Safety protocols were in place, including screening questions prior to the visit, additional usage of staff PPE, and extensive cleaning of exam room while observing appropriate contact time as indicated for disinfecting solutions.     Leeanne Rio, PA-C

## 2019-10-29 NOTE — Patient Instructions (Signed)
Please avoid heavy lifting and overexertion. Apply a heating pad to the neck for 10 minutes, 3 x daily.  A massage will also be beneficial. Use the Robaxin as directed if needed for tension. Make sure you are sleeping on a supportive pillow.   If not improving we will need to consider physical therapy/dry needling.

## 2019-11-10 ENCOUNTER — Other Ambulatory Visit: Payer: Self-pay | Admitting: Physician Assistant

## 2019-11-10 ENCOUNTER — Other Ambulatory Visit: Payer: Self-pay | Admitting: Cardiology

## 2019-11-11 ENCOUNTER — Other Ambulatory Visit: Payer: Self-pay | Admitting: Physician Assistant

## 2019-11-11 DIAGNOSIS — M459 Ankylosing spondylitis of unspecified sites in spine: Secondary | ICD-10-CM

## 2019-11-11 MED FILL — METHOCARBAMOL 500 MG TABS: 500 | 10 days supply | Qty: 30 | Fill #0

## 2019-11-11 MED FILL — HYDROCODON-APAP 10-325: 10-325 | 19 days supply | Qty: 75 | Fill #0

## 2019-11-11 MED FILL — ELIQUIS 5 MG TABLET: 5 | 15 days supply | Qty: 30 | Fill #8

## 2019-11-11 NOTE — Telephone Encounter (Signed)
Indication for chronic opioid: Chronic osteoarthritis, Ankylosing Spondylitis Medication and dose: Hydrocodone-APAP 10/325mg  # pills per month: #75 10/16/19 Last UDS date: 04/01/19 Opioid Treatment Agreement signed (Y/N): Yes, 04/01/19 Opioid Treatment Agreement last reviewed with patient:   NCCSRS reviewed this encounter (include red flags):     LOV: 08/17/19 Chronic pain

## 2019-11-11 NOTE — Telephone Encounter (Signed)
Rx request sent to pharmacy.  

## 2019-12-11 ENCOUNTER — Other Ambulatory Visit: Payer: Self-pay | Admitting: Physician Assistant

## 2019-12-11 DIAGNOSIS — M459 Ankylosing spondylitis of unspecified sites in spine: Secondary | ICD-10-CM

## 2019-12-11 MED FILL — HYDROCODON-APAP 10-325: 10-325 | 19 days supply | Qty: 75 | Fill #0

## 2019-12-11 MED FILL — ELIQUIS 5 MG TABLET: 5 | 15 days supply | Qty: 30 | Fill #9

## 2019-12-11 NOTE — Telephone Encounter (Signed)
Indication for chronic opioid: Chronic OA, AS Medication and dose: Norco 10/325 mg # pills per month: 75 on 11/11/2019 Last UDS date: 04/01/19 Opioid Treatment Agreement signed (Y/N): Yes on 04/01/19 Opioid Treatment Agreement last reviewed with patient:   NCCSRS reviewed this encounter (include red flags):     LOV: 10/29/19 acute  Chronic pain-08/17/19

## 2019-12-11 NOTE — Telephone Encounter (Signed)
Patient is due for chronic pain follow up for more refills.

## 2020-01-08 ENCOUNTER — Other Ambulatory Visit: Payer: Self-pay | Admitting: Physician Assistant

## 2020-01-08 DIAGNOSIS — M459 Ankylosing spondylitis of unspecified sites in spine: Secondary | ICD-10-CM

## 2020-01-08 MED FILL — HYDROCODON-APAP 10-325: 10-325 | 19 days supply | Qty: 75 | Fill #0

## 2020-01-08 NOTE — Telephone Encounter (Signed)
LFD 12/11/19 #75 with no refills LOV 10/29/19 NOV none

## 2020-01-12 MED FILL — ELIQUIS 5 MG TABLET: 5 | 15 days supply | Qty: 30 | Fill #10

## 2020-02-09 ENCOUNTER — Other Ambulatory Visit: Payer: Self-pay | Admitting: Physician Assistant

## 2020-02-09 DIAGNOSIS — M459 Ankylosing spondylitis of unspecified sites in spine: Secondary | ICD-10-CM

## 2020-02-09 NOTE — Telephone Encounter (Signed)
Indication for chronic opioid: Chronic OA, AS Medication and dose: Norco 10-325 mg # pills per month: 75 Last UDS date: 04/01/19 Opioid Treatment Agreement signed (Y/N): Yes, 04/01/19 Opioid Treatment Agreement last reviewed with patient:   NCCSRS reviewed this encounter (include red flags):     LOV: 08/17/19 Chronic pain management

## 2020-02-11 ENCOUNTER — Other Ambulatory Visit: Payer: Self-pay | Admitting: Physician Assistant

## 2020-02-11 ENCOUNTER — Encounter: Payer: Self-pay | Admitting: Physician Assistant

## 2020-02-11 ENCOUNTER — Telehealth (INDEPENDENT_AMBULATORY_CARE_PROVIDER_SITE_OTHER): Payer: Medicare HMO | Admitting: Physician Assistant

## 2020-02-11 DIAGNOSIS — M459 Ankylosing spondylitis of unspecified sites in spine: Secondary | ICD-10-CM | POA: Diagnosis not present

## 2020-02-11 DIAGNOSIS — G8929 Other chronic pain: Secondary | ICD-10-CM | POA: Diagnosis not present

## 2020-02-11 MED ORDER — HYDROCODONE-ACETAMINOPHEN 10-325 MG PO TABS: 1.0000 | ORAL_TABLET | Freq: Four times a day (QID) | ORAL | 0 refills | Status: DC | PRN

## 2020-02-11 MED FILL — HYDROCODON-APAP 10-325: 10-325 | 22 days supply | Qty: 90 | Fill #0

## 2020-02-11 NOTE — Progress Notes (Signed)
Virtual Visit via Video   I connected with patient on 02/11/20 at 10:00 AM EST by a video enabled telemedicine application and verified that I am speaking with the correct person using two identifiers.  Location patient: Home Location provider: [facility], Office Persons participating in the virtual visit: Patient, Provider, [place] (Patina Moore)  I discussed the limitations of evaluation and management by telemedicine and the availability of in person appointments. The patient expressed understanding and agreed to proceed.  Subjective:   HPI:   Reviewed and updated  today 02/11/2020  Indication for chronic opioid: Chronic OA, AS Medication and dose: Norco 10-325 mg # pills per month: 75 Last UDS date: 04/01/2019 Pain contract signed (date): 04/01/2019 Date narcotic database last reviewed (include red flags): today - no red flags  Pain Inventory (1-10 worse): Average Pain : 3-4/10 with medicine. 8/10 when medication runs out of system Pain Right Now: 5/10 My pain is dull and constant (character i.e. sharp, stabbing, dull, constant etc)  Pain is worse with: winter weather Relief from Meds: good but do not last long enough  In the last 24 hours, has pain interfered with the following (1-10 greatest interference) ? General activity 4 Relation with others 1 Enjoyment of life 4 What TIME of day is your pain at its worst? Later in the day       Sleep (in general) 4  Mobility/Function: Assistance device: None How many minutes can you walk? 15-30 Ability to climb steps?  Y Do you drive? Y Disabled (date): N/A  Neuro/Psych Sx: (bladder, bowel, weakness, dizziness, depression etc) No changes since last visit. Prior Studies: See EMR. Physicians involved in your care: Any changes since last visit?  No change since last visit.  ROS:   See pertinent positives and negatives per HPI.  Patient Active Problem List   Diagnosis Date Noted  . Chronic atrial fibrillation  ([place]) 02/12/2019  . Educated about COVID-19 virus infection 02/12/2019  . Kidney stones, calcium oxalate 12/03/2018  . Hyperlipidemia 02/06/2018  . Encounter for chronic pain management 03/01/2017  . Abnormal stress test 05/10/2016  . Elevated coronary artery calcium score 05/10/2016  . Hyperglycemia 04/23/2016  . Anxiety and depression 09/12/2015  . History of colonic polyps 03/02/2015  . Chronic anticoagulation 03/02/2015  . Multiple lung nodules 10/22/2014  . Lung nodule, multiple 08/24/2014  . Emphysema of lung ([place]) 07/28/2014  . Gout 06/30/2014  . Paroxysmal atrial fibrillation ([place]) 03/21/2012  . CAD (coronary artery disease) 03/21/2012  . GERD (gastroesophageal reflux disease) 03/21/2012  . COLONIC POLYPS, ADENOMATOUS 04/25/2007  . Essential hypertension 04/25/2007  . VENTRICULAR HYPERTROPHY, LEFT 04/25/2007  . HIATAL HERNIA 04/25/2007  . ARTHRITIS 04/25/2007  . SLEEP APNEA 04/25/2007  . CHOLELITHIASIS, HX OF 04/25/2007  . NEPHROLITHIASIS, HX OF 04/25/2007  . Ankylosing spondylitis ([place]) 09/04/2006    Social History   Tobacco Use  . Smoking status: Never Smoker  . Smokeless tobacco: Never Used  Substance Use Topics  . Alcohol use: No    Current Outpatient Medications:  .  allopurinol (ZYLOPRIM) 100 MG tablet, TAKE 1 TABLET EVERY DAY, Disp: 90 tablet, Rfl: 1 .  atorvastatin (LIPITOR) 10 MG tablet, TAKE 1 TABLET EVERY DAY, Disp: 90 tablet, Rfl: 1 .  diltiazem (CARDIZEM CD) 240 MG 24 hr capsule, Take 1 capsule (240 mg total) by mouth daily. Please call to schedule appointment with Dr. Percival Spanish in January 2022 for refills., Disp: 90 capsule, Rfl: 0 .  ELIQUIS 5 MG TABS tablet, TAKE 1  TABLET BY MOUTH 2 TIMES DAILY., Disp: 180 tablet, Rfl: 1 .  furosemide (LASIX) 20 MG tablet, Take 1 tablet (20 mg total) by mouth daily. Please call to schedule appointment with Dr. Percival Spanish in January 2022 for refills., Disp: 90 tablet, Rfl: 0 .  HYDROcodone-acetaminophen (NORCO) 10-325 MG  tablet, TAKE 1 TABLET BY MOUTH EVERY 6 HOURS AS NEEDED., Disp: 75 tablet, Rfl: 0 .  methocarbamol (ROBAXIN) 500 MG tablet, Take 1 tablet (500 mg total) by mouth every 8 (eight) hours as needed for muscle spasms., Disp: 30 tablet, Rfl: 0 .  metoprolol succinate (TOPROL-XL) 100 MG 24 hr tablet, TAKE 1 TABLET  TWO TIMES DAILY  WITH OR IMMEDIATELY FOLLOWING A MEAL., Disp: 180 tablet, Rfl: 3 .  ondansetron (ZOFRAN) 4 MG tablet, Take 4 mg by mouth every 8 (eight) hours as needed for nausea/vomiting., Disp: , Rfl:  .  sildenafil (REVATIO) 20 MG tablet, TAKE 1 TABLET BY MOUTH AS NEEDED FOR ERECTILE DYSFUNCTION. DO NO TAKE MORE THAN 1 DOSE IN 36 HOURS. (Patient taking differently: Take 20 mg by mouth daily as needed (erectile dysfunction.).), Disp: 30 tablet, Rfl: 0 .  tamsulosin (FLOMAX) 0.4 MG CAPS capsule, Take 0.4 mg by mouth at bedtime., Disp: , Rfl:   Allergies  Allergen Reactions  . Coumadin [Warfarin] Other (See Comments)    GI bleeds    Objective:   There were no vitals taken for this visit.  Patient is well-developed, well-nourished in no acute distress.  Resting comfortably at home.  Head is normocephalic, atraumatic.  No labored breathing.  Speech is clear and coherent with logical content.  Patient is alert and oriented at baseline.   Assessment and Plan:   1. Encounter for chronic pain management Tolerating well overall. Medication helps but runs out too soon. Will increase up to TID for now. New Rx sent. PDMP reviewed. No red flags. UDS and CSC up-to-date. Will need repeat in March. Order placed to updated CMP since he is due for this. Is having drawn at his visit with Cardiology.     Leeanne Rio, PA-C 02/11/2020

## 2020-02-12 MED FILL — ELIQUIS 5 MG TABLET: 5 | 7 days supply | Qty: 15 | Fill #11

## 2020-02-17 NOTE — Progress Notes (Signed)
Cardiology Office Note   Date:  02/18/2020   ID:  Rashod, [place] 03/05/1940, MRN [id number]  PCP:  Brunetta Jeans, PA-C  Cardiologist:   Minus Breeding, MD   Chief Complaint  Patient presents with  . Atrial Fibrillation      History of Present Illness: [name] Woods is a 80 y.o. male who with chronic atrial fibrillation, minimal CAD and hypertension.  He had a cardiac catheterization on 03/24/2012 which showed very mild nonobstructive CAD.  EF was mildly reduced to 45% the time.  Repeat echocardiogram in June 2015 showed an EF improved to 55%.  ETT in March 2018 showed ST segment depressions in inferolateral leads, poor exercise tolerance.  Echocardiogram in March 2018 showed EF 55 to 60%, mild AI, small posterior pericardial effusion.  He did have 2 episode of presyncope and was seen in September 2018, it was decided to watch and wait to see if he has any more episodes.  His diltiazem was increased at the time.   Since we last talked to him he has had no new cardiovascular complaints.  He had 1 episode of lightheadedness not long ago in his kitchen that passed quickly.  He otherwise does not have any presyncope or syncope.  He does not feel his atrial fibrillation.  He tolerates his blood thinner.  He is still doing some heating and air work.  He has noted some blood pressures in the 277O systolic but it seems to be somewhat sporadic.  He has not had any new chest pressure, neck or arm discomfort.  He has had no new shortness of breath, PND or orthopnea.   Past Medical History:  Diagnosis Date  . Ankylosing spondylitis ([place])   . Aortic atherosclerosis ([place])   . Arthritis   . Bilateral inguinal hernia   . CAD (coronary artery disease) 2423,5361   30% mid LAD lesion on cardiac cath  . Chicken pox   . Cholelithiasis   . Diverticulosis 11/26/2018   Moderate, Left Noted on Colonoscopy  . Dysrhythmia    atrial fibrillation  . Gallstones   . GERD (gastroesophageal  reflux disease)   . [place] measles   . Hiatal hernia   . History of colon polyps 11/26/2018  . History of kidney stones   . HLD (hyperlipidemia)   . HTN (hypertension)   . Hypertension   . Nephrolithiasis   . Persistent atrial fibrillation ([place])   . Sleep apnea    No CPAP  . 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 Ventricular hypertrophy     Past Surgical History:  Procedure Laterality Date  . CARDIAC CATHETERIZATION  03/2012   30% mid LAD lesion otherwise normal cors, LVEF 65%  . CHOLECYSTECTOMY    . COLONOSCOPY  11/26/2018  . CYSTOSCOPY WITH RETROGRADE PYELOGRAM, URETEROSCOPY AND STENT PLACEMENT Right 01/02/2019   Procedure: CYSTOSCOPY WITH RETROGRADE PYELOGRAM, URETEROSCOPY AND STENT PLACEMENT;  Surgeon: Alexis Frock, MD;  Location: [facility];  Service: Urology;  Laterality: Right;  1 HR  . ELBOW ARTHROPLASTY Left   . HAND SURGERY Left   . HOLMIUM LASER APPLICATION Right 44/03/1538   Procedure: HOLMIUM LASER APPLICATION;  Surgeon: Alexis Frock, MD;  Location: [facility];  Service: Urology;  Laterality: Right;  . KNEE SURGERY     x2 right  . LEFT AND RIGHT HEART CATHETERIZATION WITH CORONARY ANGIOGRAM N/A 03/24/2012   Procedure: LEFT AND RIGHT HEART CATHETERIZATION WITH CORONARY ANGIOGRAM;  Surgeon: Sherren Mocha, MD;  Location: [facility] CATH  LAB;  Service: Cardiovascular;  Laterality: N/A;  . LEG FLUID REMOVAL RIGHT    . TONSILLECTOMY    . UPPER GI ENDOSCOPY       Current Outpatient Medications  Medication Sig Dispense Refill  . allopurinol (ZYLOPRIM) 100 MG tablet TAKE 1 TABLET EVERY DAY 90 tablet 1  . atorvastatin (LIPITOR) 10 MG tablet TAKE 1 TABLET EVERY DAY 90 tablet 1  . diltiazem (CARDIZEM CD) 240 MG 24 hr capsule Take 1 capsule (240 mg total) by mouth daily. Please call to schedule appointment with Dr. Percival Spanish in January 2022 for refills. 90 capsule 0  . ELIQUIS 5 MG TABS tablet TAKE 1 TABLET BY MOUTH 2 TIMES DAILY. 180 tablet 1  . furosemide (LASIX) 20 MG tablet Take 1  tablet (20 mg total) by mouth daily. Please call to schedule appointment with Dr. Percival Spanish in January 2022 for refills. 90 tablet 0  . HYDROcodone-acetaminophen (NORCO) 10-325 MG tablet Take 1 tablet by mouth every 6 (six) hours as needed. 90 tablet 0  . methocarbamol (ROBAXIN) 500 MG tablet Take 1 tablet (500 mg total) by mouth every 8 (eight) hours as needed for muscle spasms. 30 tablet 0  . metoprolol succinate (TOPROL-XL) 100 MG 24 hr tablet TAKE 1 TABLET  TWO TIMES DAILY  WITH OR IMMEDIATELY FOLLOWING A MEAL. 180 tablet 3  . ondansetron (ZOFRAN) 4 MG tablet Take 4 mg by mouth every 8 (eight) hours as needed for nausea/vomiting.    . sildenafil (REVATIO) 20 MG tablet TAKE 1 TABLET BY MOUTH AS NEEDED FOR ERECTILE DYSFUNCTION. DO NO TAKE MORE THAN 1 DOSE IN 36 HOURS. (Patient taking differently: Take 20 mg by mouth daily as needed (erectile dysfunction.).) 30 tablet 0  . tamsulosin (FLOMAX) 0.4 MG CAPS capsule Take 0.4 mg by mouth at bedtime.     No current facility-administered medications for this visit.    Allergies:   Coumadin [warfarin]    ROS:  Please see the history of present illness.   Otherwise, review of systems are positive for none.   All other systems are reviewed and negative.    PHYSICAL EXAM: VS:  BP 140/62   Pulse 64   Ht 6' (1.829 m)   Wt 213 lb 3.2 oz (96.7 kg)   SpO2 98%   BMI 28.92 kg/m  , BMI Body mass index is 28.92 kg/m. GENERAL:  Well appearing NECK:  No jugular venous distention, waveform within normal limits, carotid upstroke brisk and symmetric, no bruits, no thyromegaly LUNGS:  Clear to auscultation bilaterally CHEST:  Unremarkable HEART:  PMI not displaced or sustained,S1 and S2 within normal limits, no S3, no clicks, no rubs, no murmurs, irregular  ABD:  Flat, positive bowel sounds normal in frequency in pitch, no bruits, no rebound, no guarding, no midline pulsatile mass, no hepatomegaly, no splenomegaly EXT:  2 plus pulses throughout, no edema, no  cyanosis no clubbing   EKG:  EKG is  ordered today. Atrial fibrillation, rate 64, premature ventricular contractions, nonspecific inferolateral ST depression unchanged from previous.  Recent Labs: No results found for requested labs within last 8760 hours.    Lipid Panel    Component Value Date/Time   CHOL 124 07/22/2017 0945   TRIG 77 07/22/2017 0945   HDL 37 (L) 07/22/2017 0945   CHOLHDL 3.4 07/22/2017 0945   CHOLHDL 4 03/02/2016 1016   VLDL 20.8 03/02/2016 1016   LDLCALC 72 07/22/2017 0945      Wt Readings from Last 3 Encounters:  02/18/20 213 lb 3.2 oz (96.7 kg)  10/29/19 207 lb 12.8 oz (94.3 kg)  08/17/19 211 lb (95.7 kg)      Other studies Reviewed: Additional studies/ records that were reviewed today include: Labs. Review of the above records demonstrates:  Please see elsewhere in the note.     ASSESSMENT AND PLAN:  CAD:    He had previous mild nonobstructive coronary disease.  No change in therapy is indicated.  For risk reduction I will check a lipid profile.  Chronic atrial fibrillation:   He tolerates anticoagulation with rate control.  I will check a CBC and other labs including a basic metabolic profile.  His primary also request a liver profile.   AKI:    Creatinine will be checked as above.   HTN: His blood pressure is mildly elevated sporadically.  He will bring me a 2-week blood pressure diary.    Current medicines are reviewed at length with the patient today.  The patient does not have concerns regarding medicines.  The following changes have been made:  None  Labs/ tests ordered today include:   Orders Placed This Encounter  Procedures  . Comprehensive metabolic panel  . CBC  . Lipid panel  . EKG 12-Lead     Disposition:   FU with me in one year.    Signed, Minus Breeding, MD  02/18/2020 10:04 AM    [facility]

## 2020-02-18 ENCOUNTER — Encounter: Payer: Self-pay | Admitting: Cardiology

## 2020-02-18 ENCOUNTER — Ambulatory Visit (INDEPENDENT_AMBULATORY_CARE_PROVIDER_SITE_OTHER): Payer: Medicare HMO | Admitting: Cardiology

## 2020-02-18 ENCOUNTER — Other Ambulatory Visit: Payer: Self-pay

## 2020-02-18 VITALS — BP 140/62 | HR 64 | Ht 72.0 in | Wt 213.2 lb

## 2020-02-18 DIAGNOSIS — I2510 Atherosclerotic heart disease of native coronary artery without angina pectoris: Secondary | ICD-10-CM | POA: Diagnosis not present

## 2020-02-18 DIAGNOSIS — I482 Chronic atrial fibrillation, unspecified: Secondary | ICD-10-CM

## 2020-02-18 MED FILL — ELIQUIS 5 MG TABLET: 5 | 15 days supply | Qty: 30 | Fill #12

## 2020-02-18 NOTE — Patient Instructions (Addendum)
Medication Instructions:  The current medical regimen is effective;  continue present plan and medications.  *If you need a refill on your cardiac medications before your next appointment, please call your pharmacy*   Lab Work: CMET, CBC, LIPID today  If you have labs (blood work) drawn today and your tests are completely normal, you will receive your results only by: Marland Kitchen MyChart Message (if you have MyChart) OR . A paper copy in the mail If you have any lab test that is abnormal or we need to change your treatment, we will call you to review the results.  Follow-Up: At [facility], you and your health needs are our priority.  As part of our continuing mission to provide you with exceptional heart care, we have created designated Provider Care Teams.  These Care Teams include your primary Cardiologist (physician) and Advanced Practice Providers (APPs -  Physician Assistants and Nurse Practitioners) who all work together to provide you with the care you need, when you need it.  We recommend signing up for the patient portal called "MyChart".  Sign up information is provided on this After Visit Summary.  MyChart is used to connect with patients for Virtual Visits (Telemedicine).  Patients are able to view lab/test results, encounter notes, upcoming appointments, etc.  Non-urgent messages can be sent to your provider as well.   To learn more about what you can do with MyChart, go to [email or web address].    Your next appointment:   12 month(s)  The format for your next appointment:   In Person  Provider:   Minus Breeding, MD

## 2020-02-19 LAB — COMPREHENSIVE METABOLIC PANEL
ALT: 22 IU/L (ref 0–44)
AST: 31 IU/L (ref 0–40)
Albumin/Globulin Ratio: 1.8 (ref 1.2–2.2)
Albumin: 4.2 g/dL (ref 3.7–4.7)
Alkaline Phosphatase: 90 IU/L (ref 44–121)
BUN/Creatinine Ratio: 14 (ref 10–24)
BUN: 19 mg/dL (ref 8–27)
Bilirubin Total: 2.4 mg/dL — ABNORMAL HIGH (ref 0.0–1.2)
CO2: 28 mmol/L (ref 20–29)
Calcium: 9.3 mg/dL (ref 8.6–10.2)
Chloride: 101 mmol/L (ref 96–106)
Creatinine, Ser: 1.35 mg/dL — ABNORMAL HIGH (ref 0.76–1.27)
GFR calc Af Amer: 57 mL/min/{1.73_m2} — ABNORMAL LOW (ref >59)
GFR calc non Af Amer: 50 mL/min/{1.73_m2} — ABNORMAL LOW (ref >59)
Globulin, Total: 2.4 g/dL (ref 1.5–4.5)
Glucose: 91 mg/dL (ref 65–99)
Potassium: 5 mmol/L (ref 3.5–5.2)
Sodium: 142 mmol/L (ref 134–144)
Total Protein: 6.6 g/dL (ref 6.0–8.5)

## 2020-02-19 LAB — CBC
Hematocrit: 53.6 % — ABNORMAL HIGH (ref 37.5–51.0)
Hemoglobin: 17.7 g/dL (ref 13.0–17.7)
MCH: 28.7 pg (ref 26.6–33.0)
MCHC: 33 g/dL (ref 31.5–35.7)
MCV: 87 fL (ref 79–97)
Platelets: 194 10*3/uL (ref 150–450)
RBC: 6.16 x10E6/uL — ABNORMAL HIGH (ref 4.14–5.80)
RDW: 12.8 % (ref 11.6–15.4)
WBC: 8 10*3/uL (ref 3.4–10.8)

## 2020-02-19 LAB — LIPID PANEL
Chol/HDL Ratio: 2.9 ratio (ref 0.0–5.0)
Cholesterol, Total: 115 mg/dL (ref 100–199)
HDL: 39 mg/dL — ABNORMAL LOW (ref >39)
LDL Chol Calc (NIH): 58 mg/dL (ref 0–99)
Triglycerides: 94 mg/dL (ref 0–149)
VLDL Cholesterol Cal: 18 mg/dL (ref 5–40)

## 2020-03-06 ENCOUNTER — Inpatient Hospital Stay (HOSPITAL_BASED_OUTPATIENT_CLINIC_OR_DEPARTMENT_OTHER)
Admission: EM | Admit: 2020-03-06 | Discharge: 2020-03-09 | DRG: 377 | Disposition: A | Discharge status: Home / Self Care | Payer: Medicare HMO | Attending: Internal Medicine | Admitting: Internal Medicine

## 2020-03-06 ENCOUNTER — Emergency Department (HOSPITAL_BASED_OUTPATIENT_CLINIC_OR_DEPARTMENT_OTHER): Payer: Medicare HMO

## 2020-03-06 ENCOUNTER — Other Ambulatory Visit: Payer: Self-pay

## 2020-03-06 ENCOUNTER — Encounter (HOSPITAL_BASED_OUTPATIENT_CLINIC_OR_DEPARTMENT_OTHER): Payer: Self-pay | Admitting: Emergency Medicine

## 2020-03-06 DIAGNOSIS — Z7901 Long term (current) use of anticoagulants: Secondary | ICD-10-CM | POA: Diagnosis not present

## 2020-03-06 DIAGNOSIS — I10 Essential (primary) hypertension: Secondary | ICD-10-CM | POA: Diagnosis not present

## 2020-03-06 DIAGNOSIS — K922 Gastrointestinal hemorrhage, unspecified: Secondary | ICD-10-CM | POA: Diagnosis present

## 2020-03-06 DIAGNOSIS — K56600 Partial intestinal obstruction, unspecified as to cause: Secondary | ICD-10-CM | POA: Diagnosis not present

## 2020-03-06 DIAGNOSIS — K219 Gastro-esophageal reflux disease without esophagitis: Secondary | ICD-10-CM | POA: Diagnosis present

## 2020-03-06 DIAGNOSIS — Z888 Allergy status to other drugs, medicaments and biological substances status: Secondary | ICD-10-CM | POA: Diagnosis not present

## 2020-03-06 DIAGNOSIS — K2970 Gastritis, unspecified, without bleeding: Secondary | ICD-10-CM | POA: Diagnosis not present

## 2020-03-06 DIAGNOSIS — K921 Melena: Secondary | ICD-10-CM | POA: Diagnosis not present

## 2020-03-06 DIAGNOSIS — K317 Polyp of stomach and duodenum: Secondary | ICD-10-CM | POA: Diagnosis not present

## 2020-03-06 DIAGNOSIS — I482 Chronic atrial fibrillation, unspecified: Secondary | ICD-10-CM | POA: Diagnosis not present

## 2020-03-06 DIAGNOSIS — I493 Ventricular premature depolarization: Secondary | ICD-10-CM | POA: Diagnosis present

## 2020-03-06 DIAGNOSIS — D6859 Other primary thrombophilia: Secondary | ICD-10-CM | POA: Diagnosis present

## 2020-03-06 DIAGNOSIS — E785 Hyperlipidemia, unspecified: Secondary | ICD-10-CM | POA: Diagnosis present

## 2020-03-06 DIAGNOSIS — Z20822 Contact with and (suspected) exposure to covid-19: Secondary | ICD-10-CM | POA: Diagnosis present

## 2020-03-06 DIAGNOSIS — Z79899 Other long term (current) drug therapy: Secondary | ICD-10-CM

## 2020-03-06 DIAGNOSIS — N200 Calculus of kidney: Secondary | ICD-10-CM | POA: Diagnosis not present

## 2020-03-06 DIAGNOSIS — K3189 Other diseases of stomach and duodenum: Secondary | ICD-10-CM

## 2020-03-06 DIAGNOSIS — Z87442 Personal history of urinary calculi: Secondary | ICD-10-CM | POA: Diagnosis present

## 2020-03-06 DIAGNOSIS — I700 Atherosclerosis of aorta: Secondary | ICD-10-CM | POA: Diagnosis present

## 2020-03-06 DIAGNOSIS — Z8249 Family history of ischemic heart disease and other diseases of the circulatory system: Secondary | ICD-10-CM | POA: Diagnosis not present

## 2020-03-06 DIAGNOSIS — G4733 Obstructive sleep apnea (adult) (pediatric): Secondary | ICD-10-CM | POA: Diagnosis not present

## 2020-03-06 DIAGNOSIS — K567 Ileus, unspecified: Secondary | ICD-10-CM | POA: Diagnosis not present

## 2020-03-06 DIAGNOSIS — R112 Nausea with vomiting, unspecified: Secondary | ICD-10-CM | POA: Diagnosis not present

## 2020-03-06 DIAGNOSIS — I4891 Unspecified atrial fibrillation: Secondary | ICD-10-CM | POA: Diagnosis not present

## 2020-03-06 DIAGNOSIS — I2510 Atherosclerotic heart disease of native coronary artery without angina pectoris: Secondary | ICD-10-CM | POA: Diagnosis present

## 2020-03-06 DIAGNOSIS — R109 Unspecified abdominal pain: Secondary | ICD-10-CM | POA: Diagnosis not present

## 2020-03-06 DIAGNOSIS — K920 Hematemesis: Secondary | ICD-10-CM | POA: Diagnosis not present

## 2020-03-06 DIAGNOSIS — J690 Pneumonitis due to inhalation of food and vomit: Secondary | ICD-10-CM | POA: Diagnosis not present

## 2020-03-06 DIAGNOSIS — K56609 Unspecified intestinal obstruction, unspecified as to partial versus complete obstruction: Secondary | ICD-10-CM | POA: Diagnosis not present

## 2020-03-06 DIAGNOSIS — K6389 Other specified diseases of intestine: Secondary | ICD-10-CM | POA: Diagnosis not present

## 2020-03-06 DIAGNOSIS — K5651 Intestinal adhesions [bands], with partial obstruction: Secondary | ICD-10-CM | POA: Diagnosis present

## 2020-03-06 DIAGNOSIS — I4819 Other persistent atrial fibrillation: Secondary | ICD-10-CM | POA: Diagnosis present

## 2020-03-06 DIAGNOSIS — K5669 Other partial intestinal obstruction: Secondary | ICD-10-CM | POA: Diagnosis not present

## 2020-03-06 DIAGNOSIS — K5710 Diverticulosis of small intestine without perforation or abscess without bleeding: Secondary | ICD-10-CM | POA: Diagnosis not present

## 2020-03-06 DIAGNOSIS — Z9049 Acquired absence of other specified parts of digestive tract: Secondary | ICD-10-CM | POA: Diagnosis not present

## 2020-03-06 DIAGNOSIS — Z4682 Encounter for fitting and adjustment of non-vascular catheter: Secondary | ICD-10-CM | POA: Diagnosis not present

## 2020-03-06 HISTORY — DX: Partial intestinal obstruction, unspecified as to cause: K56.600

## 2020-03-06 LAB — PROTIME-INR
INR: 1.2 (ref 0.8–1.2)
INR: 1.3 — ABNORMAL HIGH (ref 0.8–1.2)
Prothrombin Time: 14.5 seconds (ref 11.4–15.2)
Prothrombin Time: 15.4 seconds — ABNORMAL HIGH (ref 11.4–15.2)

## 2020-03-06 LAB — CBC WITH DIFFERENTIAL/PLATELET
Abs Immature Granulocytes: 0.05 10*3/uL (ref 0.00–0.07)
Basophils Absolute: 0.1 10*3/uL (ref 0.0–0.1)
Basophils Relative: 0 %
Eosinophils Absolute: 0.1 10*3/uL (ref 0.0–0.5)
Eosinophils Relative: 0 %
HCT: 56.8 % — ABNORMAL HIGH (ref 39.0–52.0)
Hemoglobin: 19 g/dL — ABNORMAL HIGH (ref 13.0–17.0)
Immature Granulocytes: 0 %
Lymphocytes Relative: 12 %
Lymphs Abs: 2 10*3/uL (ref 0.7–4.0)
MCH: 29.6 pg (ref 26.0–34.0)
MCHC: 33.5 g/dL (ref 30.0–36.0)
MCV: 88.5 fL (ref 80.0–100.0)
Monocytes Absolute: 0.9 10*3/uL (ref 0.1–1.0)
Monocytes Relative: 6 %
Neutro Abs: 13.8 10*3/uL — ABNORMAL HIGH (ref 1.7–7.7)
Neutrophils Relative %: 82 %
Platelets: 265 10*3/uL (ref 150–400)
RBC: 6.42 MIL/uL — ABNORMAL HIGH (ref 4.22–5.81)
RDW: 13.9 % (ref 11.5–15.5)
WBC: 16.9 10*3/uL — ABNORMAL HIGH (ref 4.0–10.5)
nRBC: 0 % (ref 0.0–0.2)

## 2020-03-06 LAB — HEMOGLOBIN AND HEMATOCRIT, BLOOD
HCT: 53.3 % — ABNORMAL HIGH (ref 39.0–52.0)
Hemoglobin: 17.2 g/dL — ABNORMAL HIGH (ref 13.0–17.0)

## 2020-03-06 LAB — COMPREHENSIVE METABOLIC PANEL
ALT: 16 U/L (ref 0–44)
AST: 26 U/L (ref 15–41)
Albumin: 4.2 g/dL (ref 3.5–5.0)
Alkaline Phosphatase: 85 U/L (ref 38–126)
Anion gap: 13 (ref 5–15)
BUN: 16 mg/dL (ref 8–23)
CO2: 27 mmol/L (ref 22–32)
Calcium: 9.7 mg/dL (ref 8.9–10.3)
Chloride: 99 mmol/L (ref 98–111)
Creatinine, Ser: 1.4 mg/dL — ABNORMAL HIGH (ref 0.61–1.24)
GFR, Estimated: 51 mL/min — ABNORMAL LOW (ref >60)
Glucose, Bld: 144 mg/dL — ABNORMAL HIGH (ref 70–99)
Potassium: 3.7 mmol/L (ref 3.5–5.1)
Sodium: 139 mmol/L (ref 135–145)
Total Bilirubin: 2.5 mg/dL — ABNORMAL HIGH (ref 0.3–1.2)
Total Protein: 7.7 g/dL (ref 6.5–8.1)

## 2020-03-06 LAB — SARS CORONAVIRUS 2 BY RT PCR (HOSPITAL ORDER, PERFORMED IN ~~LOC~~ HOSPITAL LAB): SARS Coronavirus 2: NEGATIVE

## 2020-03-06 LAB — OCCULT BLOOD X 1 CARD TO LAB, STOOL: Fecal Occult Bld: NEGATIVE

## 2020-03-06 LAB — TYPE AND SCREEN
ABO/RH(D): A POS
Antibody Screen: NEGATIVE

## 2020-03-06 LAB — TROPONIN I (HIGH SENSITIVITY)
Troponin I (High Sensitivity): 10 ng/L (ref <18)
Troponin I (High Sensitivity): 12 ng/L (ref <18)

## 2020-03-06 LAB — LACTIC ACID, PLASMA: Lactic Acid, Venous: 1.4 mmol/L (ref 0.5–1.9)

## 2020-03-06 LAB — TSH: TSH: 2.291 u[IU]/mL (ref 0.350–4.500)

## 2020-03-06 MED ORDER — PANTOPRAZOLE SODIUM 40 MG IV SOLR
40.0000 mg | Freq: Two times a day (BID) | INTRAVENOUS | Status: DC
  Administered 2020-03-06 – 2020-03-08 (×5): 40 mg via INTRAVENOUS
  Filled 2020-03-06 (×5): qty 40

## 2020-03-06 MED ORDER — OCTREOTIDE LOAD VIA INFUSION
100.0000 ug | Freq: Once | INTRAVENOUS | Status: DC
  Filled 2020-03-06: qty 50

## 2020-03-06 MED ORDER — OCTREOTIDE ACETATE 50 MCG/ML IJ SOLN
INTRAMUSCULAR | Status: AC
  Filled 2020-03-06: qty 1

## 2020-03-06 MED ORDER — SODIUM CHLORIDE 0.9 % IV BOLUS
500.0000 mL | Freq: Once | INTRAVENOUS | Status: AC
  Administered 2020-03-06: 500 mL via INTRAVENOUS

## 2020-03-06 MED ORDER — IOHEXOL 300 MG/ML  SOLN
100.0000 mL | Freq: Once | INTRAMUSCULAR | Status: AC | PRN
  Administered 2020-03-06: 100 mL via INTRAVENOUS

## 2020-03-06 MED ORDER — DILTIAZEM HCL ER COATED BEADS 240 MG PO CP24
240.0000 mg | ORAL_CAPSULE | Freq: Every day | ORAL | Status: DC
  Administered 2020-03-06 – 2020-03-09 (×4): 240 mg via ORAL
  Filled 2020-03-06 (×4): qty 1

## 2020-03-06 MED ORDER — SODIUM CHLORIDE 0.9 % IV SOLN
3.0000 g | Freq: Four times a day (QID) | INTRAVENOUS | Status: DC
  Administered 2020-03-06 – 2020-03-09 (×10): 3 g via INTRAVENOUS
  Filled 2020-03-06 (×2): qty 3
  Filled 2020-03-06: qty 0.15
  Filled 2020-03-06: qty 3
  Filled 2020-03-06: qty 0.15
  Filled 2020-03-06 (×5): qty 8
  Filled 2020-03-06: qty 3
  Filled 2020-03-06: qty 8
  Filled 2020-03-06: qty 0.15

## 2020-03-06 MED ORDER — SODIUM CHLORIDE 0.9 % IV SOLN
3.0000 g | Freq: Once | INTRAVENOUS | Status: AC
  Administered 2020-03-06: 3 g via INTRAVENOUS
  Filled 2020-03-06: qty 8

## 2020-03-06 MED ORDER — SODIUM CHLORIDE 0.9 % IV SOLN
INTRAVENOUS | Status: DC | PRN
  Administered 2020-03-06: 50 mL via INTRAVENOUS

## 2020-03-06 MED ORDER — ALLOPURINOL 100 MG PO TABS
100.0000 mg | ORAL_TABLET | Freq: Every day | ORAL | Status: DC
  Administered 2020-03-06 – 2020-03-09 (×4): 100 mg via ORAL
  Filled 2020-03-06 (×4): qty 1

## 2020-03-06 MED ORDER — METOPROLOL SUCCINATE ER 100 MG PO TB24
100.0000 mg | ORAL_TABLET | Freq: Two times a day (BID) | ORAL | Status: DC
  Administered 2020-03-06 – 2020-03-09 (×6): 100 mg via ORAL
  Filled 2020-03-06 (×6): qty 1

## 2020-03-06 MED ORDER — ONDANSETRON HCL 4 MG/2ML IJ SOLN
4.0000 mg | Freq: Four times a day (QID) | INTRAMUSCULAR | Status: DC | PRN
  Filled 2020-03-06: qty 2

## 2020-03-06 MED ORDER — SODIUM CHLORIDE 0.9 % IV SOLN
50.0000 ug/h | INTRAVENOUS | Status: DC
  Filled 2020-03-06 (×2): qty 1

## 2020-03-06 MED ORDER — ONDANSETRON HCL 4 MG/2ML IJ SOLN
4.0000 mg | Freq: Once | INTRAMUSCULAR | Status: AC
  Administered 2020-03-06: 4 mg via INTRAVENOUS
  Filled 2020-03-06: qty 2

## 2020-03-06 MED ORDER — ONDANSETRON HCL 4 MG PO TABS: 4.0000 mg | ORAL_TABLET | Freq: Four times a day (QID) | ORAL | Status: DC | PRN

## 2020-03-06 MED ORDER — ATORVASTATIN CALCIUM 10 MG PO TABS
10.0000 mg | ORAL_TABLET | Freq: Every day | ORAL | Status: DC
  Administered 2020-03-06 – 2020-03-09 (×4): 10 mg via ORAL
  Filled 2020-03-06 (×4): qty 1

## 2020-03-06 MED ORDER — SODIUM CHLORIDE 0.9 % IV SOLN: INTRAVENOUS | Status: AC

## 2020-03-06 MED ORDER — ACETAMINOPHEN 325 MG PO TABS: 650.0000 mg | ORAL_TABLET | Freq: Four times a day (QID) | ORAL | Status: DC | PRN

## 2020-03-06 MED ORDER — PANTOPRAZOLE SODIUM 40 MG IV SOLR
40.0000 mg | Freq: Once | INTRAVENOUS | Status: AC
  Administered 2020-03-06: 40 mg via INTRAVENOUS
  Filled 2020-03-06: qty 40

## 2020-03-06 MED ORDER — ACETAMINOPHEN 650 MG RE SUPP: 650.0000 mg | Freq: Four times a day (QID) | RECTAL | Status: DC | PRN

## 2020-03-06 NOTE — ED Notes (Signed)
ED Provider at bedside. 

## 2020-03-06 NOTE — ED Notes (Signed)
Sandostatin drip received from pharmacy sent with carelink upon pt's transfer to [facility]. Order was d/c'ed prior to pt receiving medication

## 2020-03-06 NOTE — Progress Notes (Signed)
Patient refused to go on CPAP tonight. Pt states he does not have one at home at this time.

## 2020-03-06 NOTE — ED Notes (Signed)
NG tube advanced ~10cm per [facility] from [place], [place]. Xray at bedside

## 2020-03-06 NOTE — ED Notes (Signed)
Spoke to [employer] at [facility] regarding hospitalist consult for admission

## 2020-03-06 NOTE — Plan of Care (Signed)
Patient is a 80 year old male with a PMH of A. fib on Eliquis, HTN, HLD, diverticulosis and OSA who presented with melena x 2 days and coffee ground emesis since yesterday.   The work-up showed stable hemodynamics.  Hemoglobin 19.  FOBT negative. CT abdomen suggested LLL PNA and ileus.   Patient was started on Protonix IV. He is known to [facility]. ED had contacted [facility] Janett Billow who will consult when patient arrives at [facility] or [place]. Patient was started on Unasyn for suspected aspiration PNA. Patient was placed NG tube and ED is currently waiting for general surgery consult. The inpatient med tele.order is placed.  Charlann Lange, MD 1315pm 03/06/20

## 2020-03-06 NOTE — ED Notes (Signed)
X-ray at bedside

## 2020-03-06 NOTE — H&P (Signed)
History and Physical    [name] Woods [id number] DOB: August 03, 1940 DOA: 03/06/2020  PCP: Brunetta Jeans, PA-C Patient coming from: Home  Chief Complaint: Abdominal cramping, dark melanotic stool, and dark coffee-ground emesis  HPI: [name] Woods is a 80 y.o. male with medical history significant of CAD, A. fib on Eliquis, HTN, HLD, OSA, diverticulosis who presented with abdominal cramping, dark melanotic stool and dark coffee-ground emesis.  Patient reports that he started to have intermittent dark melanotic stools about 2 days ago.  He then started to have abdominal cramping and multiple episodes of dark coffee-ground emesis yesterday. His last vomiting was last night, and he has not had any vomiting today. His last dose of Eliquis was in the evening of 03/05/2020.  Denies bright red blood per rectum.  Denies dizziness, lightheadedness or syncope.  Patient went to [facility] ED for further evaluation today.  In the emergency room, he was afebrile with pulse 64, RR 18, BP 167/97 and room air O2 sat 94%.  The labs showed WBC 16.9, hemoglobin 19, normal platelet, normal INR, negative FOBT, creatinine 1.4, negative troponin.  EKG showed A. fib with PVCs.  CT abdomen showed suggested ileus versus SBO, LLL pneumonia and bilateral nonobstructive nephrolithiasis.  Patient received Protonix 40 mg IV x1, Unasyn 3 g IV x1, and NG tube with wall suctioning.  He is transferred to [facility] for further management.    Review of Systems: As per HPI otherwise 10 point review of systems negative.  Review of Systems Otherwise negative except as per HPI, including: General: Denies fever, chills, night sweats or unintended weight loss. Resp: Denies cough, wheezing, shortness of breath. Cardiac: Denies chest pain, palpitations, orthopnea, paroxysmal nocturnal dyspnea. GI: Positive for nausea, dark coffee-ground emesis, dark melanotic stools and abdominal cramping.  Denies diarrhea GU: Denies dysuria,  frequency, hesitancy or incontinence MS: Denies muscle aches, joint pain or swelling Neuro: Denies headache, neurologic deficits (focal weakness, numbness, tingling), abnormal gait Psych: Denies anxiety, depression, SI/HI/AVH Skin: Denies new rashes or lesions ID: Denies sick contacts, exotic exposures, travel  Past Medical History:  Diagnosis Date  . Ankylosing spondylitis ([place])   . Aortic atherosclerosis ([place])   . Arthritis   . Bilateral inguinal hernia   . CAD (coronary artery disease) [id number]   30% mid LAD lesion on cardiac cath  . Chicken pox   . Cholelithiasis   . Diverticulosis 11/26/2018   Moderate, Left Noted on Colonoscopy  . Dysrhythmia    atrial fibrillation  . Gallstones   . GERD (gastroesophageal reflux disease)   . [place] measles   . Hiatal hernia   . History of colon polyps 11/26/2018  . History of kidney stones   . HLD (hyperlipidemia)   . HTN (hypertension)   . Hypertension   . Nephrolithiasis   . Persistent atrial fibrillation ([place])   . Sleep apnea    No CPAP  . 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 Ventricular hypertrophy     Past Surgical History:  Procedure Laterality Date  . CARDIAC CATHETERIZATION  03/2012   30% mid LAD lesion otherwise normal cors, LVEF 65%  . CHOLECYSTECTOMY    . COLONOSCOPY  11/26/2018  . CYSTOSCOPY WITH RETROGRADE PYELOGRAM, URETEROSCOPY AND STENT PLACEMENT Right 01/02/2019   Procedure: CYSTOSCOPY WITH RETROGRADE PYELOGRAM, URETEROSCOPY AND STENT PLACEMENT;  Surgeon: Alexis Frock, MD;  Location: [facility] ORS;  Service: Urology;  Laterality: Right;  1 HR  . ELBOW ARTHROPLASTY Left   . HAND SURGERY Left   .  HOLMIUM LASER APPLICATION Right 56/03/1306   Procedure: HOLMIUM LASER APPLICATION;  Surgeon: Alexis Frock, MD;  Location: [facility];  Service: Urology;  Laterality: Right;  . KNEE SURGERY     x2 right  . LEFT AND RIGHT HEART CATHETERIZATION WITH CORONARY ANGIOGRAM N/A 03/24/2012   Procedure: LEFT AND RIGHT HEART  CATHETERIZATION WITH CORONARY ANGIOGRAM;  Surgeon: Sherren Mocha, MD;  Location: [facility] CATH LAB;  Service: Cardiovascular;  Laterality: N/A;  . LEG FLUID REMOVAL RIGHT    . TONSILLECTOMY    . UPPER GI ENDOSCOPY      SOCIAL HISTORY:  reports that he has never smoked. He has never used smokeless tobacco. He reports that he does not drink alcohol and does not use drugs.  Allergies  Allergen Reactions  . Coumadin [Warfarin] Other (See Comments)    GI bleeds    FAMILY HISTORY: Family History  Problem Relation Age of Onset  . Heart disease Father 41       Deceased  . Heart attack Father 32  . Heart disease Brother 26       "Died in his sleep"  . Arthritis/Rheumatoid Mother        Deceased-86  . Hyperlipidemia Brother   . Stroke Brother 86       Deceased  . Other Sister        Estate agent in [place]  . Other Sister        MVA  . Other Brother        Carjacked-killed  . Dementia Paternal Grandmother   . Cancer Paternal Uncle   . Healthy Brother   . Healthy Son        X3  . Healthy Daughter        x1  . Colon cancer Neg Hx   . Esophageal cancer Neg Hx   . Stomach cancer Neg Hx   . Rectal cancer Neg Hx      Prior to Admission medications   Medication Sig Start Date End Date Taking? Authorizing Provider  allopurinol (ZYLOPRIM) 100 MG tablet TAKE 1 TABLET EVERY DAY 11/11/19  Yes Brunetta Jeans, PA-C  atorvastatin (LIPITOR) 10 MG tablet TAKE 1 TABLET EVERY DAY 11/11/19  Yes Brunetta Jeans, PA-C  diltiazem (CARDIZEM CD) 240 MG 24 hr capsule Take 1 capsule (240 mg total) by mouth daily. Please call to schedule appointment with Dr. Percival Spanish in January 2022 for refills. 11/11/19  Yes Hochrein, Jeneen Rinks, MD  ELIQUIS 5 MG TABS tablet TAKE 1 TABLET BY MOUTH 2 TIMES DAILY. 04/29/19  Yes Minus Breeding, MD  furosemide (LASIX) 20 MG tablet Take 1 tablet (20 mg total) by mouth daily. Please call to schedule appointment with Dr. Percival Spanish in January 2022 for refills. 11/11/19  Yes Minus Breeding,  MD  HYDROcodone-acetaminophen (NORCO) 10-325 MG tablet Take 1 tablet by mouth every 6 (six) hours as needed. 02/11/20  Yes Brunetta Jeans, PA-C  metoprolol succinate (TOPROL-XL) 100 MG 24 hr tablet TAKE 1 TABLET  TWO TIMES DAILY  WITH OR IMMEDIATELY FOLLOWING A MEAL. 04/21/19  Yes Hochrein, Jeneen Rinks, MD  sildenafil (REVATIO) 20 MG tablet TAKE 1 TABLET BY MOUTH AS NEEDED FOR ERECTILE DYSFUNCTION. DO NO TAKE MORE THAN 1 DOSE IN 36 HOURS. Patient taking differently: Take 20 mg by mouth daily as needed (erectile dysfunction.). 09/19/17   Brunetta Jeans, PA-C    Physical Exam: Vitals:   03/06/20 1500 03/06/20 1515 03/06/20 1530 03/06/20 1629  BP: 140/80  136/75 (!) 143/75  Pulse: 78  76 70  Resp: 16  19   Temp:  97.9 F (36.6 C)  97.7 F (36.5 C)  TempSrc:  Oral  Oral  SpO2: 92%  96%   Weight:    90.4 kg  Height:    6' (1.829 m)      Constitutional: NAD, calm, comfortable.  Acute ill-appearing Eyes: PERRL, lids and conjunctivae normal ENMT: Mucous membranes are moist. Posterior pharynx clear of any exudate or lesions.Normal dentition.  Neck: normal, supple, no masses, no thyromegaly Respiratory: clear to auscultation bilaterally, no wheezing, no crackles. Normal respiratory effort. No accessory muscle use.  Cardiovascular: Regular rate and rhythm, no murmurs / rubs / gallops. No extremity edema. 2+ pedal pulses. No carotid bruits.  Abdomen: NG tube to wall suctioning.  No tenderness, no masses palpated. No hepatosplenomegaly. Bowel sounds hypoactive Musculoskeletal: no clubbing / cyanosis. No joint deformity upper and lower extremities. Good ROM, no contractures. Normal muscle tone.  Skin: no rashes, lesions, ulcers. No induration Neurologic: CN 2-12 grossly intact. Sensation intact, DTR normal. Strength 5/5 in all 4.  Psychiatric: Normal judgment and insight. Alert and oriented x 3. Normal mood.     Labs on Admission: I have personally reviewed following labs and imaging  studies  CBC: Recent Labs  Lab 03/06/20 1023 03/06/20 1737  WBC 16.9*  --   NEUTROABS 13.8*  --   HGB 19.0* 17.2*  HCT 56.8* 53.3*  MCV 88.5  --   PLT 265  --    Basic Metabolic Panel: Recent Labs  Lab 03/06/20 1023  [place] 139  K 3.7  CL 99  CO2 27  GLUCOSE 144*  BUN 16  CREATININE 1.40*  CALCIUM 9.7   GFR: Estimated Creatinine Clearance: 47 mL/min (A) (by C-G formula based on SCr of 1.4 mg/dL (H)). Liver Function Tests: Recent Labs  Lab 03/06/20 1023  AST 26  ALT 16  ALKPHOS 85  BILITOT 2.5*  PROT 7.7  ALBUMIN 4.2   No results for input(s): LIPASE, AMYLASE in the last 168 hours. No results for input(s): AMMONIA in the last 168 hours. Coagulation Profile: Recent Labs  Lab 03/06/20 1023 03/06/20 1205  INR 1.2 1.3*   Cardiac Enzymes: No results for input(s): CKTOTAL, CKMB, CKMBINDEX, TROPONINI in the last 168 hours. BNP (last 3 results) No results for input(s): PROBNP in the last 8760 hours. HbA1C: No results for input(s): HGBA1C in the last 72 hours. CBG: No results for input(s): GLUCAP in the last 168 hours. Lipid Profile: No results for input(s): CHOL, HDL, LDLCALC, TRIG, CHOLHDL, LDLDIRECT in the last 72 hours. Thyroid Function Tests: No results for input(s): TSH, T4TOTAL, FREET4, T3FREE, THYROIDAB in the last 72 hours. Anemia Panel: No results for input(s): VITAMINB12, FOLATE, FERRITIN, TIBC, IRON, RETICCTPCT in the last 72 hours. Urine analysis:    Component Value Date/Time   COLORURINE YELLOW 06/16/2015 1728   APPEARANCEUR CLEAR 06/16/2015 1728   LABSPEC 1.008 06/16/2015 1728   PHURINE 6.5 06/16/2015 1728   GLUCOSEU NEGATIVE 06/16/2015 1728   GLUCOSEU NEGATIVE 09/22/2014 1021   HGBUR NEGATIVE 06/16/2015 1728   HGBUR large 05/16/2009 1014   BILIRUBINUR negative 12/24/2018 0943   KETONESUR NEGATIVE 06/16/2015 1728   PROTEINUR Positive (A) 12/24/2018 0943   PROTEINUR NEGATIVE 06/16/2015 1728   UROBILINOGEN 0.2 12/24/2018 0943    UROBILINOGEN 0.2 09/22/2014 1021   NITRITE negative 12/24/2018 0943   NITRITE NEGATIVE 06/16/2015 1728   LEUKOCYTESUR Negative 12/24/2018 0943   Sepsis Labs: !!!!!!!!!!!!!!!!!!!!!!!!!!!!!!!!!!!!!!!!!!!! @LABRCNTIP (procalcitonin:4,lacticidven:4) ) Recent Results (from the  past 240 hour(s))  SARS Coronavirus 2 by RT PCR (hospital order, performed in [facility] hospital lab) Nasopharyngeal Nasopharyngeal Swab     Status: None   Collection Time: 03/06/20 10:56 AM   Specimen: Nasopharyngeal Swab  Result Value Ref Range Status   SARS Coronavirus 2 NEGATIVE NEGATIVE Final    Comment: (NOTE) SARS-CoV-2 target nucleic acids are NOT DETECTED.  The SARS-CoV-2 RNA is generally detectable in upper and lower respiratory specimens during the acute phase of infection. The lowest concentration of SARS-CoV-2 viral copies this assay can detect is 250 copies / mL. A negative result does not preclude SARS-CoV-2 infection and should not be used as the sole basis for treatment or other patient management decisions.  A negative result may occur with improper specimen collection / handling, submission of specimen other than nasopharyngeal swab, presence of viral mutation(s) within the areas targeted by this assay, and inadequate number of viral copies (<250 copies / mL). A negative result must be combined with clinical observations, patient history, and epidemiological information.  Fact Sheet for Patients:   StrictlyIdeas.no  Fact Sheet for Healthcare Providers: BankingDealers.co.za  This test is not yet approved or  cleared by the [place] FDA and has been authorized for detection and/or diagnosis of SARS-CoV-2 by FDA under an Emergency Use Authorization (EUA).  This EUA will remain in effect (meaning this test can be used) for the duration of the COVID-19 declaration under Section 564(b)(1) of the Act, 21 U.S.C. section 360bbb-3(b)(1), unless the  authorization is terminated or revoked sooner.  Performed at [facility], [address] [place], [place] [address]      Radiological Exams on Admission: DG Abdomen 1 View  Result Date: 03/06/2020 CLINICAL DATA:  NG tube placement. EXAM: ABDOMEN - 1 VIEW COMPARISON:  CT abdomen pelvis-earlier same day FINDINGS: Enteric tube side port projected the expected location the gastroesophageal junction. Moderate to marked gaseous distension of several loops of small bowel with index loop of small bowel in the left mid hemiabdomen measuring 4.8 cm in diameter. This finding is associated with a conspicuous paucity of distal colonic gas. No supine evidence of pneumoperitoneum. No pneumatosis or portal venous gas. Post cholecystectomy. Calcifications overlies expected location of the bilateral renal fossa compatible with known nephrolithiasis. No acute osseous abnormalities. IMPRESSION: 1. Enteric tube side port projects over the expected location of the gastroesophageal junction. Advancement approximately 10 cm is. 2. Similar findings again worrisome for least partial small bowel obstruction. 3. Bilateral nephrolithiasis as was demonstrated on preceding abdominal CT. Electronically Signed   By: Sandi Mariscal M.D.   On: 03/06/2020 13:49   CT ABDOMEN PELVIS W CONTRAST  Result Date: 03/06/2020 CLINICAL DATA:  Generalized abdominal pain, bloating. EXAM: CT ABDOMEN AND PELVIS WITH CONTRAST TECHNIQUE: Multidetector CT imaging of the abdomen and pelvis was performed using the standard protocol following bolus administration of intravenous contrast. CONTRAST:  158mL OMNIPAQUE IOHEXOL 300 MG/ML  SOLN COMPARISON:  December 24, 2018. FINDINGS: Lower chest: Focal left lower lobe airspace opacity is noted concerning for pneumonia. Hepatobiliary: No focal liver abnormality is seen. Status post cholecystectomy. No biliary dilatation. Pancreas: Unremarkable. No pancreatic ductal dilatation or surrounding inflammatory  changes. Spleen: Normal in size without focal abnormality. Adrenals/Urinary Tract: Adrenal glands appear normal. Bilateral nonobstructive nephrolithiasis is noted. Bilateral renal cortical scarring is noted. Bilateral renal cysts are noted. No hydronephrosis or renal obstruction is noted. Urinary bladder is unremarkable. Stomach/Bowel: Mildly distended and fluid-filled stomach is noted. Mild proximal small  bowel dilatation is noted without definite transition zone; is uncertain if this represents ileus or possibly partial small bowel obstruction. No colonic dilatation is noted. Sigmoid diverticulosis is noted without inflammation. The stomach appears normal. Vascular/Lymphatic: Aortic atherosclerosis. No enlarged abdominal or pelvic lymph nodes. Reproductive: Stable mild prostatic enlargement is noted. Other: No abdominal wall hernia or abnormality. No abdominopelvic ascites. Musculoskeletal: No acute or significant osseous findings. IMPRESSION: 1. Focal left lower lobe airspace opacity is noted concerning for pneumonia. 2. Bilateral nonobstructive nephrolithiasis. No hydronephrosis or renal obstruction is noted. 3. Sigmoid diverticulosis without inflammation. 4. Stable mild prostatic enlargement. 5. Mild proximal small bowel dilatation is noted without definite transition zone; is uncertain if this represents ileus or possibly partial small bowel obstruction. 6. Aortic atherosclerosis. Aortic Atherosclerosis (ICD10-I70.0). Electronically Signed   By: Lupita RaiderJames  Green Jr M.D.   On: 03/06/2020 11:46   DG Chest Portable 1 View  Result Date: 03/06/2020 CLINICAL DATA:  Advancement of nasogastric tube. EXAM: PORTABLE CHEST 1 VIEW COMPARISON:  Same day. FINDINGS: Distal tip of nasogastric tube is seen at the expected position of the gastroesophageal junction. This is not significantly changed compared to prior exam. IMPRESSION: No significant change compared to prior exam. Distal tip of nasogastric tube seen at the  expected position of the gastroesophageal junction. Electronically Signed   By: Lupita RaiderJames  Green Jr M.D.   On: 03/06/2020 14:47     All images have been reviewed by me personally.  EKG: Independently reviewed.   Assessment/Plan Principal Problem:   GI bleed Active Problems:   Essential hypertension   NEPHROLITHIASIS, HX OF   CAD (coronary artery disease)   GERD (gastroesophageal reflux disease)   Chronic anticoagulation   Chronic atrial fibrillation (HCC)   Aspiration pneumonia (HCC)   Ileus (HCC)   Partial small bowel obstruction (HCC)   OSA (obstructive sleep apnea)   Assessment  plan  #Dark coffee-ground emesis and dark melanotic stools concerning for upper GIB    -Telemetry monitoring -Trend H&H every 6 hours -N.p.o. - IVF hydration -Protonix 40 g IV twice daily -Hold home Eliquis-last dose was evening on 03/05/2020 - [facility] consult is notified.    # LLL aspiration pneumonitis versus pneumonia  -Patient denies fevers, chills or URI symptoms -WBC 16.9 -CXR suggested LLL pneumonia -Blood cultures pending -Empiric Unasyn   # Ileus vs partial SBO  -CT abdomen suggested ileus versus partial SBO -N.p.o. - NG tube to intermittent wall suctioning -General surgery consult is called   #A. fib on Eliquis #CAD -hold home Eliquis given GI bleed  #HTN and HLD  -Chronic and at baseline - hold home lasix - last echo in 2018 showed normal EF   # OSA -CPAP     DVT prophylaxis: SCD Code Status: Full code Family Communication: None at bedside Consults called:  [facility] and general surgery Admission status: Inpatient  Status is: Inpatient  Remains inpatient appropriate because:Ongoing diagnostic testing needed not appropriate for outpatient work up   Dispo: The patient is from: Home              Anticipated d/c is to: Home              Anticipated d/c date is: 2 days              Patient currently is not medically stable to d/c.   Difficult to place patient  No       Time Spent: 65 minutes.  >50% of the time was devoted  to discussing the patients care, assessment, plan and disposition with other care givers along with counseling the patient about the risks and benefits of treatment.    Charlann Lange MD [facility]  If 7PM-7AM, please contact night-coverage   03/06/2020, 6:46 PM

## 2020-03-06 NOTE — Consult Note (Signed)
Reason for Consult: SBO Referring Physician: [name] is an 80 y.o. male.  HPI: Patient is a 80 year old male, with a history of CAD, atrial fib on Eliquis, sleep apnea, who comes in secondary to abdominal pain, nausea vomiting.  Patient states that the episode of nausea vomiting began last night.  He states that his belly was fairly distended and tight.  He states that he had some coffee-ground emesis.  Patient states has not had any occurrences like this in the past.  Patient does have a history of cholecystectomy in the past.  Upon evaluation the ER patient underwent CT scan.  I did review this personally.  CT scan was significant for distended loops of small bowel without a definite transition point, ileus versus possible partial small bowel obstruction.  General surgery was consulted for further evaluation and management.  Past Medical History:  Diagnosis Date  . Ankylosing spondylitis ([place])   . Aortic atherosclerosis ([place])   . Arthritis   . Bilateral inguinal hernia   . CAD (coronary artery disease) 0258,5277   30% mid LAD lesion on cardiac cath  . Chicken pox   . Cholelithiasis   . Diverticulosis 11/26/2018   Moderate, Left Noted on Colonoscopy  . Dysrhythmia    atrial fibrillation  . Gallstones   . GERD (gastroesophageal reflux disease)   . [place] measles   . Hiatal hernia   . History of colon polyps 11/26/2018  . History of kidney stones   . HLD (hyperlipidemia)   . HTN (hypertension)   . Hypertension   . Nephrolithiasis   . Persistent atrial fibrillation ([place])   . Sleep apnea    No CPAP  . 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 Ventricular hypertrophy     Past Surgical History:  Procedure Laterality Date  . CARDIAC CATHETERIZATION  03/2012   30% mid LAD lesion otherwise normal cors, LVEF 65%  . CHOLECYSTECTOMY    . COLONOSCOPY  11/26/2018  . CYSTOSCOPY WITH RETROGRADE PYELOGRAM, URETEROSCOPY AND STENT PLACEMENT Right 01/02/2019   Procedure:  CYSTOSCOPY WITH RETROGRADE PYELOGRAM, URETEROSCOPY AND STENT PLACEMENT;  Surgeon: Alexis Frock, MD;  Location: [facility];  Service: Urology;  Laterality: Right;  1 HR  . ELBOW ARTHROPLASTY Left   . HAND SURGERY Left   . HOLMIUM LASER APPLICATION Right 82/04/2351   Procedure: HOLMIUM LASER APPLICATION;  Surgeon: Alexis Frock, MD;  Location: [facility];  Service: Urology;  Laterality: Right;  . KNEE SURGERY     x2 right  . LEFT AND RIGHT HEART CATHETERIZATION WITH CORONARY ANGIOGRAM N/A 03/24/2012   Procedure: LEFT AND RIGHT HEART CATHETERIZATION WITH CORONARY ANGIOGRAM;  Surgeon: Sherren Mocha, MD;  Location: [facility] CATH LAB;  Service: Cardiovascular;  Laterality: N/A;  . LEG FLUID REMOVAL RIGHT    . TONSILLECTOMY    . UPPER GI ENDOSCOPY      Family History  Problem Relation Age of Onset  . Heart disease Father 40       Deceased  . Heart attack Father 10  . Heart disease Brother 64       "Died in his sleep"  . Arthritis/Rheumatoid Mother        Deceased-86  . Hyperlipidemia Brother   . Stroke Brother 77       Deceased  . Other Sister        Estate agent in [place]  . Other Sister        MVA  . Other Brother  Carjacked-killed  . Dementia Paternal Grandmother   . Cancer Paternal Uncle   . Healthy Brother   . Healthy Son        X3  . Healthy Daughter        x1  . Colon cancer Neg Hx   . Esophageal cancer Neg Hx   . Stomach cancer Neg Hx   . Rectal cancer Neg Hx     Social History:  reports that he has never smoked. He has never used smokeless tobacco. He reports that he does not drink alcohol and does not use drugs.  Allergies:  Allergies  Allergen Reactions  . Coumadin [Warfarin] Other (See Comments)    GI bleeds    Medications: I have reviewed the patient's current medications.  Results for orders placed or performed during the hospital encounter of 03/06/20 (from the past 48 hour(s))  Comprehensive metabolic panel     Status: Abnormal   Collection Time: 03/06/20 10:23 AM   Result Value Ref Range   Sodium 139 135 - 145 mmol/L   Potassium 3.7 3.5 - 5.1 mmol/L   Chloride 99 98 - 111 mmol/L   CO2 27 22 - 32 mmol/L   Glucose, Bld 144 (H) 70 - 99 mg/dL    Comment: Glucose reference range applies only to samples taken after fasting for at least 8 hours.   BUN 16 8 - 23 mg/dL   Creatinine, Ser 1.40 (H) 0.61 - 1.24 mg/dL   Calcium 9.7 8.9 - 10.3 mg/dL   Total Protein 7.7 6.5 - 8.1 g/dL   Albumin 4.2 3.5 - 5.0 g/dL   AST 26 15 - 41 U/L   ALT 16 0 - 44 U/L   Alkaline Phosphatase 85 38 - 126 U/L   Total Bilirubin 2.5 (H) 0.3 - 1.2 mg/dL   GFR, Estimated 51 (L) >60 mL/min    Comment: (NOTE) Calculated using the CKD-EPI Creatinine Equation (2021)    Anion gap 13 5 - 15    Comment: Performed at [facility], [address] [place], [place] [address]  CBC with Differential     Status: Abnormal   Collection Time: 03/06/20 10:23 AM  Result Value Ref Range   WBC 16.9 (H) 4.0 - 10.5 K/uL   RBC 6.42 (H) 4.22 - 5.81 MIL/uL   Hemoglobin 19.0 (H) 13.0 - 17.0 g/dL   HCT 56.8 (H) 39.0 - 52.0 %    Comment: REPEATED TO VERIFY Hematocrit >55%, anticoagulant adjusted tube needed for coagulation tests, obtain from laboratory    MCV 88.5 80.0 - 100.0 fL   MCH 29.6 26.0 - 34.0 pg   MCHC 33.5 30.0 - 36.0 g/dL   RDW 13.9 11.5 - 15.5 %   Platelets 265 150 - 400 K/uL   nRBC 0.0 0.0 - 0.2 %   Neutrophils Relative % 82 %   Neutro Abs 13.8 (H) 1.7 - 7.7 K/uL   Lymphocytes Relative 12 %   Lymphs Abs 2.0 0.7 - 4.0 K/uL   Monocytes Relative 6 %   Monocytes Absolute 0.9 0.1 - 1.0 K/uL   Eosinophils Relative 0 %   Eosinophils Absolute 0.1 0.0 - 0.5 K/uL   Basophils Relative 0 %   Basophils Absolute 0.1 0.0 - 0.1 K/uL   Immature Granulocytes 0 %   Abs Immature Granulocytes 0.05 0.00 - 0.07 K/uL    Comment: Performed at [facility], [address] [place], [place] [address]  Protime-INR  Status: None   Collection Time: 03/06/20 10:23 AM  Result  Value Ref Range   Prothrombin Time 14.5 11.4 - 15.2 seconds    Comment: DUE TO ELEVATED HEMATOCRIT, BLOOD:ANTICOAGULANT RATIO IN TUBE IS UNSATISFACTORY. RESULTS MAY BE AFFECTED. PLEASE RECOLLECT IN ANTICOAGULANT-ADJUSTED TUBE, OBTAINABLE FROM THE LAB. Josph Macho RN NOTIFIED [phone] PHILLIPS C    INR 1.2 0.8 - 1.2    Comment: (NOTE) INR goal varies based on device and disease states. Performed at [facility], [address] [place], [place] [address]   Troponin I (High Sensitivity)     Status: None   Collection Time: 03/06/20 10:23 AM  Result Value Ref Range   Troponin I (High Sensitivity) 10 <18 ng/L    Comment: (NOTE) Elevated high sensitivity troponin I (hsTnI) values and significant  changes across serial measurements may suggest ACS but many other  chronic and acute conditions are known to elevate hsTnI results.  Refer to the "Links" section for chest pain algorithms and additional  guidance. Performed at [facility], [address] [place], [place] [address]   Occult blood card to lab, stool     Status: None   Collection Time: 03/06/20 10:26 AM  Result Value Ref Range   Fecal Occult Bld NEGATIVE NEGATIVE    Comment: Performed at [facility], [address] [place], [place] [address]  SARS Coronavirus 2 by RT PCR (hospital order, performed in [facility] hospital lab) Nasopharyngeal Nasopharyngeal Swab     Status: None   Collection Time: 03/06/20 10:56 AM   Specimen: Nasopharyngeal Swab  Result Value Ref Range   SARS Coronavirus 2 NEGATIVE NEGATIVE    Comment: (NOTE) SARS-CoV-2 target nucleic acids are NOT DETECTED.  The SARS-CoV-2 RNA is generally detectable in upper and lower respiratory specimens during the acute phase of infection. The lowest concentration of SARS-CoV-2 viral copies this assay can detect is 250 copies / mL. A negative result does not preclude SARS-CoV-2 infection and should not be used as the sole basis for  treatment or other patient management decisions.  A negative result may occur with improper specimen collection / handling, submission of specimen other than nasopharyngeal swab, presence of viral mutation(s) within the areas targeted by this assay, and inadequate number of viral copies (<250 copies / mL). A negative result must be combined with clinical observations, patient history, and epidemiological information.  Fact Sheet for Patients:   StrictlyIdeas.no  Fact Sheet for Healthcare Providers: BankingDealers.co.za  This test is not yet approved or  cleared by the [place] FDA and has been authorized for detection and/or diagnosis of SARS-CoV-2 by FDA under an Emergency Use Authorization (EUA).  This EUA will remain in effect (meaning this test can be used) for the duration of the COVID-19 declaration under Section 564(b)(1) of the Act, 21 U.S.C. section 360bbb-3(b)(1), unless the authorization is terminated or revoked sooner.  Performed at [facility], [address] [place], [place] [address]   Protime-INR     Status: Abnormal   Collection Time: 03/06/20 12:05 PM  Result Value Ref Range   Prothrombin Time 15.4 (H) 11.4 - 15.2 seconds    Comment: SPECIMEN COLLECTED IN ANTICOAGULANT ADJUSTED TUBE DUE TO ELEVATED HEMATOCRIT   INR 1.3 (H) 0.8 - 1.2    Comment: (NOTE) INR goal varies based on device and disease states. Performed at [facility], [address], [place], [place] [address]  Troponin I (High Sensitivity)     Status: None   Collection Time: 03/06/20  1:07 PM  Result Value Ref Range   Troponin I (High Sensitivity) 12 <18 ng/L    Comment: (NOTE) Elevated high sensitivity troponin I (hsTnI) values and significant  changes across serial measurements may suggest ACS but many other  chronic and acute conditions are known to elevate hsTnI results.  Refer to the "Links" section for chest pain  algorithms and additional  guidance. Performed at [facility], [address] [place], [place] [address]   Hemoglobin and hematocrit, blood     Status: Abnormal   Collection Time: 03/06/20  5:37 PM  Result Value Ref Range   Hemoglobin 17.2 (H) 13.0 - 17.0 g/dL   HCT 53.3 (H) 39.0 - 52.0 %    Comment: Performed at [facility] [address], [place], [place] [address]  Type and screen     Status: None   Collection Time: 03/06/20  5:37 PM  Result Value Ref Range   ABO/RH(D) A POS    Antibody Screen NEG    Sample Expiration      03/09/2020,2359 Performed at [facility], [place] [address], [place], [place] [address]   TSH     Status: None   Collection Time: 03/06/20  5:37 PM  Result Value Ref Range   TSH 2.291 0.350 - 4.500 uIU/mL    Comment: Performed by a 3rd Generation assay with a functional sensitivity of <=0.01 uIU/mL. Performed at [facility], [place] [address], [place], [place] [address]   Lactic acid, plasma     Status: None   Collection Time: 03/06/20  6:02 PM  Result Value Ref Range   Lactic Acid, Venous 1.4 0.5 - 1.9 mmol/L    Comment: Performed at [facility] [address] [place], [place] [address]    DG Abdomen 1 View  Result Date: 03/06/2020 CLINICAL DATA:  NG tube placement. EXAM: ABDOMEN - 1 VIEW COMPARISON:  CT abdomen pelvis-earlier same day FINDINGS: Enteric tube side port projected the expected location the gastroesophageal junction. Moderate to marked gaseous distension of several loops of small bowel with index loop of small bowel in the left mid hemiabdomen measuring 4.8 cm in diameter. This finding is associated with a conspicuous paucity of distal colonic gas. No supine evidence of pneumoperitoneum. No pneumatosis or portal venous gas. Post cholecystectomy. Calcifications overlies expected location of the bilateral renal fossa compatible with known nephrolithiasis. No acute osseous abnormalities. IMPRESSION: 1. Enteric  tube side port projects over the expected location of the gastroesophageal junction. Advancement approximately 10 cm is. 2. Similar findings again worrisome for least partial small bowel obstruction. 3. Bilateral nephrolithiasis as was demonstrated on preceding abdominal CT. Electronically Signed   By: Sandi Mariscal M.D.   On: 03/06/2020 13:49   CT ABDOMEN PELVIS W CONTRAST  Result Date: 03/06/2020 CLINICAL DATA:  Generalized abdominal pain, bloating. EXAM: CT ABDOMEN AND PELVIS WITH CONTRAST TECHNIQUE: Multidetector CT imaging of the abdomen and pelvis was performed using the standard protocol following bolus administration of intravenous contrast. CONTRAST:  14mL OMNIPAQUE IOHEXOL 300 MG/ML  SOLN COMPARISON:  December 24, 2018. FINDINGS: Lower chest: Focal left lower lobe airspace opacity is noted concerning for pneumonia. Hepatobiliary: No focal liver abnormality is seen. Status post cholecystectomy. No biliary dilatation. Pancreas: Unremarkable. No pancreatic ductal dilatation or surrounding inflammatory changes. Spleen: Normal in size without focal abnormality. Adrenals/Urinary Tract: Adrenal glands  appear normal. Bilateral nonobstructive nephrolithiasis is noted. Bilateral renal cortical scarring is noted. Bilateral renal cysts are noted. No hydronephrosis or renal obstruction is noted. Urinary bladder is unremarkable. Stomach/Bowel: Mildly distended and fluid-filled stomach is noted. Mild proximal small bowel dilatation is noted without definite transition zone; is uncertain if this represents ileus or possibly partial small bowel obstruction. No colonic dilatation is noted. Sigmoid diverticulosis is noted without inflammation. The stomach appears normal. Vascular/Lymphatic: Aortic atherosclerosis. No enlarged abdominal or pelvic lymph nodes. Reproductive: Stable mild prostatic enlargement is noted. Other: No abdominal wall hernia or abnormality. No abdominopelvic ascites. Musculoskeletal: No acute or  significant osseous findings. IMPRESSION: 1. Focal left lower lobe airspace opacity is noted concerning for pneumonia. 2. Bilateral nonobstructive nephrolithiasis. No hydronephrosis or renal obstruction is noted. 3. Sigmoid diverticulosis without inflammation. 4. Stable mild prostatic enlargement. 5. Mild proximal small bowel dilatation is noted without definite transition zone; is uncertain if this represents ileus or possibly partial small bowel obstruction. 6. Aortic atherosclerosis. Aortic Atherosclerosis (ICD10-I70.0). Electronically Signed   By: Marijo Conception M.D.   On: 03/06/2020 11:46   DG Chest Portable 1 View  Result Date: 03/06/2020 CLINICAL DATA:  Advancement of nasogastric tube. EXAM: PORTABLE CHEST 1 VIEW COMPARISON:  Same day. FINDINGS: Distal tip of nasogastric tube is seen at the expected position of the gastroesophageal junction. This is not significantly changed compared to prior exam. IMPRESSION: No significant change compared to prior exam. Distal tip of nasogastric tube seen at the expected position of the gastroesophageal junction. Electronically Signed   By: Marijo Conception M.D.   On: 03/06/2020 14:47    Review of Systems  Constitutional: Negative for chills and fever.  HENT: Negative for ear discharge, hearing loss and sore throat.   Eyes: Negative for discharge.  Respiratory: Negative for cough and shortness of breath.   Cardiovascular: Negative for chest pain and leg swelling.  Gastrointestinal: Positive for abdominal pain, nausea and vomiting. Negative for constipation.  Musculoskeletal: Negative for myalgias and neck pain.  Skin: Negative for rash.  Allergic/Immunologic: Negative for environmental allergies.  Neurological: Negative for dizziness and seizures.  Hematological: Does not bruise/bleed easily.  Psychiatric/Behavioral: Negative for suicidal ideas.  All other systems reviewed and are negative.  Blood pressure (!) 143/75, pulse 70, temperature 97.7 F (36.5  C), temperature source Oral, resp. rate 19, height 6' (1.829 m), weight 90.4 kg, SpO2 96 %. Physical Exam Constitutional:      General: Vital signs are normal.     Appearance: He is well-developed and well-nourished.     Comments: Conversant No acute distress  Eyes:     General: Lids are normal. No scleral icterus.    Comments: Pupils are equal round and reactive No lid lag Moist conjunctiva  Neck:     Thyroid: No thyromegaly.     Trachea: No tracheal tenderness.     Comments: No cervical lymphadenopathy Cardiovascular:     Rate and Rhythm: Normal rate and regular rhythm.     Pulses: Intact distal pulses.     Heart sounds: No murmur heard.   Pulmonary:     Effort: Pulmonary effort is normal.     Breath sounds: Normal breath sounds. No wheezing or rales.  Abdominal:     General: Abdomen is flat. Bowel sounds are normal. There is no distension.     Palpations: There is no hepatosplenomegaly.     Tenderness: There is no abdominal tenderness.     Hernia: No hernia is present.  Skin:    General: Skin is warm.     Findings: No rash.     Nails: There is no clubbing or cyanosis.     Comments: Normal skin turgor  Neurological:     Mental Status: He is alert and oriented to person, place, and time.     Comments: Normal gait and station  Psychiatric:        Judgment: Judgment normal.     Comments: Appropriate affect     Assessment/Plan: Patient is a 80 year old male, with a history of a likely partial small bowel obstruction, upper GI bleed likely secondary to Mallory-Weiss tear with his nausea vomiting.  Patient with NG tube in place with no blood with an NG tube.  1.  We will begin SBO protocol. 2.  Recommend continue NG tube, n.p.o., IV fluids. 3.  We will follow along.  Ralene Ok 03/06/2020, 8:27 PM

## 2020-03-06 NOTE — ED Triage Notes (Addendum)
Vomiting coffee ground emesis since yesterday, dark black stools since yesterday. States his abd is swollen. Takes Eliquis. Also endorses chest pain.

## 2020-03-06 NOTE — ED Notes (Signed)
Spoke with the Pharmacist, Joelene Millin, regarding the Octreotide order.  Door Deliah Boston will be bringing the medicine here.

## 2020-03-06 NOTE — ED Provider Notes (Addendum)
[facility] Provider Note   CSN: [id number] Arrival date & time: 03/06/20  1005     History Chief Complaint  Patient presents with  . Emesis  . Melena    [name] Woods is a 80 y.o. male history of atrial fibrillation on Eliquis, CAD, GERD, hypertension, hyperlipidemia, obesity, sleep apnea.  Patient reports 2 days ago he noticed dark tarry appearing stool with his bowel movement.  He reports that this has continued since onset yesterday he developed nausea and vomiting he reports emesis as dark tobacco-like in color with small chunks present he denies similar in the past.  He reports associated with a generalized abdominal pain as a aching sensation constant nonradiating worsened with emesis improved with rest.  Symptoms are associated with abdominal swelling.  Patient reports that when he vomits this causes him a mild chest discomfort denies similar symptoms in the past.  Denies fever/chills, shortness of breath, cough/hemoptysis, dysuria/hematuria, diarrhea or any additional concerns  HPI     Past Medical History:  Diagnosis Date  . Ankylosing spondylitis ([place])   . Aortic atherosclerosis ([place])   . Arthritis   . Bilateral inguinal hernia   . CAD (coronary artery disease) [id number]   30% mid LAD lesion on cardiac cath  . Chicken pox   . Cholelithiasis   . Diverticulosis 11/26/2018   Moderate, Left Noted on Colonoscopy  . Dysrhythmia    atrial fibrillation  . Gallstones   . GERD (gastroesophageal reflux disease)   . [place] measles   . Hiatal hernia   . History of colon polyps 11/26/2018  . History of kidney stones   . HLD (hyperlipidemia)   . HTN (hypertension)   . Hypertension   . Nephrolithiasis   . Persistent atrial fibrillation ([place])   . Sleep apnea    No CPAP  . 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 Ventricular hypertrophy     Patient Active Problem List   Diagnosis Date Noted  . GI bleed 03/06/2020  . Chronic atrial  fibrillation ([place]) 02/12/2019  . Educated about COVID-19 virus infection 02/12/2019  . Kidney stones, calcium oxalate 12/03/2018  . Hyperlipidemia 02/06/2018  . Encounter for chronic pain management 03/01/2017  . Abnormal stress test 05/10/2016  . Elevated coronary artery calcium score 05/10/2016  . Hyperglycemia 04/23/2016  . Anxiety and depression 09/12/2015  . History of colonic polyps 03/02/2015  . Chronic anticoagulation 03/02/2015  . Multiple lung nodules 10/22/2014  . Lung nodule, multiple 08/24/2014  . Emphysema of lung ([place]) 07/28/2014  . Gout 06/30/2014  . Paroxysmal atrial fibrillation ([place]) 03/21/2012  . CAD (coronary artery disease) 03/21/2012  . GERD (gastroesophageal reflux disease) 03/21/2012  . COLONIC POLYPS, ADENOMATOUS 04/25/2007  . Essential hypertension 04/25/2007  . VENTRICULAR HYPERTROPHY, LEFT 04/25/2007  . HIATAL HERNIA 04/25/2007  . ARTHRITIS 04/25/2007  . SLEEP APNEA 04/25/2007  . CHOLELITHIASIS, HX OF 04/25/2007  . NEPHROLITHIASIS, HX OF 04/25/2007  . Ankylosing spondylitis ([place]) 09/04/2006    Past Surgical History:  Procedure Laterality Date  . CARDIAC CATHETERIZATION  03/2012   30% mid LAD lesion otherwise normal cors, LVEF 65%  . CHOLECYSTECTOMY    . COLONOSCOPY  11/26/2018  . CYSTOSCOPY WITH RETROGRADE PYELOGRAM, URETEROSCOPY AND STENT PLACEMENT Right 01/02/2019   Procedure: CYSTOSCOPY WITH RETROGRADE PYELOGRAM, URETEROSCOPY AND STENT PLACEMENT;  Surgeon: Alexis Frock, MD;  Location: [facility];  Service: Urology;  Laterality: Right;  1 HR  . ELBOW ARTHROPLASTY Left   . HAND SURGERY Left   .  HOLMIUM LASER APPLICATION Right [id number]   Procedure: HOLMIUM LASER APPLICATION;  Surgeon: Alexis Frock, MD;  Location: [facility];  Service: Urology;  Laterality: Right;  . KNEE SURGERY     x2 right  . LEFT AND RIGHT HEART CATHETERIZATION WITH CORONARY ANGIOGRAM N/A 03/24/2012   Procedure: LEFT AND RIGHT HEART CATHETERIZATION WITH CORONARY ANGIOGRAM;   Surgeon: Sherren Mocha, MD;  Location: [facility] CATH LAB;  Service: Cardiovascular;  Laterality: N/A;  . LEG FLUID REMOVAL RIGHT    . TONSILLECTOMY    . UPPER GI ENDOSCOPY         Family History  Problem Relation Age of Onset  . Heart disease Father 69       Deceased  . Heart attack Father 11  . Heart disease Brother 23       "Died in his sleep"  . Arthritis/Rheumatoid Mother        Deceased-86  . Hyperlipidemia Brother   . Stroke Brother 77       Deceased  . Other Sister        Estate agent in [place]  . Other Sister        MVA  . Other Brother        Carjacked-killed  . Dementia Paternal Grandmother   . Cancer Paternal Uncle   . Healthy Brother   . Healthy Son        X3  . Healthy Daughter        x1  . Colon cancer Neg Hx   . Esophageal cancer Neg Hx   . Stomach cancer Neg Hx   . Rectal cancer Neg Hx     Social History   Tobacco Use  . Smoking status: Never Smoker  . Smokeless tobacco: Never Used  Vaping Use  . Vaping Use: Never used  Substance Use Topics  . Alcohol use: No  . Drug use: No    Home Medications Prior to Admission medications   Medication Sig Start Date End Date Taking? Authorizing Provider  allopurinol (ZYLOPRIM) 100 MG tablet TAKE 1 TABLET EVERY DAY 11/11/19  Yes Brunetta Jeans, PA-C  atorvastatin (LIPITOR) 10 MG tablet TAKE 1 TABLET EVERY DAY 11/11/19  Yes Brunetta Jeans, PA-C  diltiazem (CARDIZEM CD) 240 MG 24 hr capsule Take 1 capsule (240 mg total) by mouth daily. Please call to schedule appointment with Dr. Percival Spanish in January 2022 for refills. 11/11/19  Yes Hochrein, Jeneen Rinks, MD  ELIQUIS 5 MG TABS tablet TAKE 1 TABLET BY MOUTH 2 TIMES DAILY. 04/29/19  Yes Minus Breeding, MD  furosemide (LASIX) 20 MG tablet Take 1 tablet (20 mg total) by mouth daily. Please call to schedule appointment with Dr. Percival Spanish in January 2022 for refills. 11/11/19  Yes Minus Breeding, MD  HYDROcodone-acetaminophen (NORCO) 10-325 MG tablet Take 1 tablet by mouth every 6  (six) hours as needed. 02/11/20  Yes Brunetta Jeans, PA-C  metoprolol succinate (TOPROL-XL) 100 MG 24 hr tablet TAKE 1 TABLET  TWO TIMES DAILY  WITH OR IMMEDIATELY FOLLOWING A MEAL. 04/21/19  Yes Hochrein, Jeneen Rinks, MD  sildenafil (REVATIO) 20 MG tablet TAKE 1 TABLET BY MOUTH AS NEEDED FOR ERECTILE DYSFUNCTION. DO NO TAKE MORE THAN 1 DOSE IN 36 HOURS. Patient taking differently: Take 20 mg by mouth daily as needed (erectile dysfunction.). 09/19/17   Brunetta Jeans, PA-C    Allergies    Coumadin [warfarin]  Review of Systems   Review of Systems Ten systems are reviewed and are negative for acute change  except as noted in the HPI  Physical Exam Updated Vital Signs BP 139/68   Pulse (!) 110   Temp 98.5 F (36.9 C) (Oral)   Resp 20   Ht 6' (1.829 m)   Wt 91.6 kg   SpO2 96%   BMI 27.40 kg/m   Physical Exam Constitutional:      General: He is not in acute distress.    Appearance: Normal appearance. He is well-developed. He is not ill-appearing or diaphoretic.  HENT:     Head: Normocephalic and atraumatic.  Eyes:     General: Vision grossly intact. Gaze aligned appropriately.     Pupils: Pupils are equal, round, and reactive to light.  Neck:     Trachea: Trachea and phonation normal.  Cardiovascular:     Rate and Rhythm: Normal rate. Rhythm irregular.  Pulmonary:     Effort: Pulmonary effort is normal. No respiratory distress.     Breath sounds: Normal breath sounds.  Abdominal:     General: Abdomen is protuberant. There is distension.     Palpations: Abdomen is soft.     Tenderness: There is generalized abdominal tenderness. There is no guarding or rebound.  Genitourinary:    Comments: Rectal examination chaperoned by Silver Huguenin RN.  No bleeding external hemorrhoid.  Normal rectal tone.  No palpable internal hemorrhoids or other abnormalities.  Dark brown stool on exam, no gross blood. Musculoskeletal:        General: Normal range of motion.     Cervical back: Normal range  of motion.  Skin:    General: Skin is warm and dry.  Neurological:     Mental Status: He is alert.     GCS: GCS eye subscore is 4. GCS verbal subscore is 5. GCS motor subscore is 6.     Comments: Speech is clear and goal oriented, follows commands Major Cranial nerves without deficit, no facial droop Moves extremities without ataxia, coordination intact  Psychiatric:        Behavior: Behavior normal.     ED Results / Procedures / Treatments   Labs (all labs ordered are listed, but only abnormal results are displayed) Labs Reviewed  COMPREHENSIVE METABOLIC PANEL - Abnormal; Notable for the following components:      Result Value   Glucose, Bld 144 (*)    Creatinine, Ser 1.40 (*)    Total Bilirubin 2.5 (*)    GFR, Estimated 51 (*)    All other components within normal limits  CBC WITH DIFFERENTIAL/PLATELET - Abnormal; Notable for the following components:   WBC 16.9 (*)    RBC 6.42 (*)    Hemoglobin 19.0 (*)    HCT 56.8 (*)    Neutro Abs 13.8 (*)    All other components within normal limits  PROTIME-INR - Abnormal; Notable for the following components:   Prothrombin Time 15.4 (*)    INR 1.3 (*)    All other components within normal limits  SARS CORONAVIRUS 2 BY RT PCR (HOSPITAL ORDER, [facility])  OCCULT BLOOD X 1 CARD TO LAB, STOOL  PROTIME-INR  TROPONIN I (HIGH SENSITIVITY)  TROPONIN I (HIGH SENSITIVITY)    EKG EKG Interpretation  Date/Time:  Sunday March 06 2020 10:20:54 EST Ventricular Rate:  81 PR Interval:    QRS Duration: 113 QT Interval:  360 QTC Calculation: 372 R Axis:   112 Text Interpretation: Atrial fibrillation Ventricular bigeminy Anterior infarct, old Repol abnrm suggests ischemia, diffuse leads No STEMI Confirmed by  Octaviano Glow ([id number]) on 03/06/2020 10:53:46 AM   Radiology No results found.  Procedures Procedures   Medications Ordered in ED Medications  0.9 %  sodium chloride infusion ( Intravenous Stopped  03/06/20 1305)  pantoprazole (PROTONIX) injection 40 mg (40 mg Intravenous Given 03/06/20 1049)  sodium chloride 0.9 % bolus 500 mL (0 mLs Intravenous Stopped 03/06/20 1217)  ondansetron (ZOFRAN) injection 4 mg (4 mg Intravenous Given 03/06/20 1109)  iohexol (OMNIPAQUE) 300 MG/ML solution 100 mL (100 mLs Intravenous Contrast Given 03/06/20 1114)  Ampicillin-Sulbactam (UNASYN) 3 g in sodium chloride 0.9 % 100 mL IVPB (0 g Intravenous Stopped 03/06/20 1305)    ED Course  I have reviewed the triage vital signs and the nursing notes.  Pertinent labs & imaging results that were available during my care of the patient were reviewed by me and considered in my medical decision making (see chart for details).  Clinical Course as of 03/06/20 1352  Sun Mar 06, 2020  1044 80 yo male with CHF, A Fib on eliquis presenting with dark stools 2 days ago, also with dark coffee ground emesis since yesterday.  Abd distended on exam. Rectal exam with dark stool.  Pt here in A Fib with ectopy, HR 60's.  BP stable.  Ddx includes bleeding ulcer vs lower GI bleed vs perforation vs varices.  No hx of etoh use disorder, and EGD in 2017 showed stomach polyp but no varices.   [MT]  0350 Colonoscopy in 2017 with [place] with multiple polyps. [MT]  1152 IMPRESSION: 1. Focal left lower lobe airspace opacity is noted concerning for pneumonia. 2. Bilateral nonobstructive nephrolithiasis. No hydronephrosis or renal obstruction is noted. 3. Sigmoid diverticulosis without inflammation. 4. Stable mild prostatic enlargement. 5. Mild proximal small bowel dilatation is noted without definite transition zone; is uncertain if this represents ileus or possibly partial small bowel obstruction. 6. Aortic atherosclerosis.  Aortic Atherosclerosis (ICD10-I70.0). [BM]  [place] [BM]  1225 Hgb 19.0.  CT consistent with PNA, likely aspiration, and SBO vs ileus.  Will start on antibiotics and anticipate admission.  PA provider to consult GI  regarding "coffee ground emesis" [MT]  1301 Dr. Nicoletta Dress [BM]    Clinical Course User Index [BM] Deliah Boston, PA-C [MT] Langston Masker Carola Rhine, MD   MDM Rules/Calculators/A&P                         Additional history obtained from: 1. Nursing notes from this visit. 2. Review electronic medical records 3. Family at bedside. ------------------------ I ordered, reviewed and interpreted labs which include: CBC shows leukocytosis of 16.9, additionally shows elevated hemoglobin of 19.0, no anemia. Covid test negative. Hemoccult stool negative. CMP shows no emergent lecture derangement.  Mild elevation of total bilirubin, otherwise LFTs within normal limits.  Mild ovation of creatinine at 1.4 normal BUN.  No gap PT/INR elevated. High-sensitivity troponin within normal limits low suspicion for ACS.  EKG: Atrial fibrillation Ventricular bigeminy Anterior infarct, old Repol abnrm suggests ischemia, diffuse leads No STEMI Confirmed by Octaviano Glow [phone]) on 03/06/2020 10:53:46 AM - Initially there was concern for possible bleeding esophageal varices and octreotide was ordered.  Subsequently CT imaging showed possible ileus and small bowel obstruction.  Consult was called to gastroenterology spoke with Janett Billow APP who reviewed patient's imaging and chart advises likely not variceal bleed and octreotide was discontinued per their recommendation.  They advised patient may be admitted to either [facility] or [facility].  Consult was  then called to general surgery, spoke with RN in the OR with Dr. Ninfa Linden who advised seeing patient in consult at either [facility] or [facility].  Consult was then called to hospitalist Dr. Nicoletta Dress and patient was accepted for admission.  Patient was given IV fluids during this admission for concern of dehydration he was also given Unasyn for treatment of suspected aspiration pneumonia, he is not requiring any supplemental oxygen or blood pressure support in the ER.  NG tube was  placed for symptomatic relief of SBO.  Patient and his family member were updated on findings and plan of care they are agreeable for admission.   Note: Portions of this report may have been transcribed using voice recognition software. Every effort was made to ensure accuracy; however, inadvertent computerized transcription errors may still be present. Final Clinical Impression(s) / ED Diagnoses Final diagnoses:  Aspiration pneumonia of right lower lobe, unspecified aspiration pneumonia type ([place])  SBO (small bowel obstruction) [facility])    Rx / DC Orders ED Discharge Orders    None       Gari Crown 03/06/20 1352    Deliah Boston, PA-C 03/06/20 1602    Wyvonnia Dusky, MD 03/06/20 1816

## 2020-03-07 ENCOUNTER — Inpatient Hospital Stay (HOSPITAL_COMMUNITY): Payer: Medicare HMO

## 2020-03-07 LAB — CBC WITH DIFFERENTIAL/PLATELET
Abs Immature Granulocytes: 0.04 10*3/uL (ref 0.00–0.07)
Basophils Absolute: 0.1 10*3/uL (ref 0.0–0.1)
Basophils Relative: 1 %
Eosinophils Absolute: 0.4 10*3/uL (ref 0.0–0.5)
Eosinophils Relative: 4 %
HCT: 50.2 % (ref 39.0–52.0)
Hemoglobin: 17 g/dL (ref 13.0–17.0)
Immature Granulocytes: 0 %
Lymphocytes Relative: 23 %
Lymphs Abs: 2.4 10*3/uL (ref 0.7–4.0)
MCH: 30.3 pg (ref 26.0–34.0)
MCHC: 33.9 g/dL (ref 30.0–36.0)
MCV: 89.5 fL (ref 80.0–100.0)
Monocytes Absolute: 0.8 10*3/uL (ref 0.1–1.0)
Monocytes Relative: 8 %
Neutro Abs: 6.5 10*3/uL (ref 1.7–7.7)
Neutrophils Relative %: 64 %
Platelets: 193 10*3/uL (ref 150–400)
RBC: 5.61 MIL/uL (ref 4.22–5.81)
RDW: 14.2 % (ref 11.5–15.5)
WBC: 10.3 10*3/uL (ref 4.0–10.5)
nRBC: 0 % (ref 0.0–0.2)

## 2020-03-07 LAB — COMPREHENSIVE METABOLIC PANEL
ALT: 9 U/L (ref 0–44)
AST: 18 U/L (ref 15–41)
Albumin: 3.1 g/dL — ABNORMAL LOW (ref 3.5–5.0)
Alkaline Phosphatase: 64 U/L (ref 38–126)
Anion gap: 12 (ref 5–15)
BUN: 14 mg/dL (ref 8–23)
CO2: 24 mmol/L (ref 22–32)
Calcium: 8.9 mg/dL (ref 8.9–10.3)
Chloride: 107 mmol/L (ref 98–111)
Creatinine, Ser: 1.41 mg/dL — ABNORMAL HIGH (ref 0.61–1.24)
GFR, Estimated: 51 mL/min — ABNORMAL LOW (ref >60)
Glucose, Bld: 88 mg/dL (ref 70–99)
Potassium: 3.7 mmol/L (ref 3.5–5.1)
Sodium: 143 mmol/L (ref 135–145)
Total Bilirubin: 2.4 mg/dL — ABNORMAL HIGH (ref 0.3–1.2)
Total Protein: 5.8 g/dL — ABNORMAL LOW (ref 6.5–8.1)

## 2020-03-07 LAB — HEMOGLOBIN AND HEMATOCRIT, BLOOD
HCT: 48.2 % (ref 39.0–52.0)
HCT: 50.1 % (ref 39.0–52.0)
HCT: 49.2 % (ref 39.0–52.0)
Hemoglobin: 15.6 g/dL (ref 13.0–17.0)
Hemoglobin: 16.9 g/dL (ref 13.0–17.0)
Hemoglobin: 16.1 g/dL (ref 13.0–17.0)

## 2020-03-07 LAB — GLUCOSE, CAPILLARY
Glucose-Capillary: 94 mg/dL (ref 70–99)
Glucose-Capillary: 89 mg/dL (ref 70–99)
Glucose-Capillary: 95 mg/dL (ref 70–99)
Glucose-Capillary: 82 mg/dL (ref 70–99)

## 2020-03-07 LAB — ABO/RH: ABO/RH(D): A POS

## 2020-03-07 LAB — HEMOGLOBIN A1C
Hgb A1c MFr Bld: 5.9 % — ABNORMAL HIGH (ref 4.8–5.6)
Mean Plasma Glucose: 122.63 mg/dL

## 2020-03-07 LAB — MAGNESIUM: Magnesium: 1.8 mg/dL (ref 1.7–2.4)

## 2020-03-07 MED ORDER — DIATRIZOATE MEGLUMINE & SODIUM 66-10 % PO SOLN
90.0000 mL | Freq: Once | ORAL | Status: AC
  Administered 2020-03-07: 90 mL via NASOGASTRIC
  Filled 2020-03-07: qty 90

## 2020-03-07 MED ORDER — POTASSIUM PHOSPHATES 15 MMOLE/5ML IV SOLN: 20.0000 mmol | Freq: Once | INTRAVENOUS | Status: DC

## 2020-03-07 MED ORDER — SODIUM CHLORIDE 0.9 % IV SOLN: INTRAVENOUS | Status: DC

## 2020-03-07 MED ORDER — ORAL CARE MOUTH RINSE
15.0000 mL | Freq: Two times a day (BID) | OROMUCOSAL | Status: DC
  Administered 2020-03-07 – 2020-03-08 (×2): 15 mL via OROMUCOSAL

## 2020-03-07 MED ORDER — CHLORHEXIDINE GLUCONATE 0.12 % MT SOLN
15.0000 mL | Freq: Two times a day (BID) | OROMUCOSAL | Status: DC
  Administered 2020-03-07 – 2020-03-09 (×5): 15 mL via OROMUCOSAL
  Filled 2020-03-07 (×5): qty 15

## 2020-03-07 MED ORDER — POTASSIUM CHLORIDE 10 MEQ/100ML IV SOLN
10.0000 meq | INTRAVENOUS | Status: AC
  Administered 2020-03-07 (×4): 10 meq via INTRAVENOUS
  Filled 2020-03-07 (×4): qty 100

## 2020-03-07 MED ORDER — MAGNESIUM SULFATE 2 GM/50ML IV SOLN
2.0000 g | Freq: Once | INTRAVENOUS | Status: AC
  Administered 2020-03-07: 2 g via INTRAVENOUS
  Filled 2020-03-07: qty 50

## 2020-03-07 NOTE — Progress Notes (Signed)
Pt unable to wear cpap at this time d/t NG tube.  Pt states he's never worn cpap before and will probably not wear while here.  RT will continue to monitor.

## 2020-03-07 NOTE — Progress Notes (Signed)
PROGRESS NOTE    [name] [name]  DUK:[id number] DOB: 1941-01-24 DOA: 03/06/2020 PCP: [name] [name]    Brief Narrative:  80 year old [name] with history of coronary artery disease, A. fib on Eliquis, hypertension, hyperlipidemia, obstructive sleep apnea and diverticulosis presented to the hospital with abdominal cramping, dark melanotic stool and frequent vomiting associated with coffee-ground emesis for about 2 days. In the emergency room afebrile.  Hemodynamically stable.  On room air.  WBC 16.9.  Hemoglobin 19.  Negative FOBT.  EKG with A. fib.  CT scan abdomen showed ileus, left lower lobe airspace disease.  Patient was given IV Protonix, Unasyn, NG tube and admit to the hospital with surgical and GI consultation.   Assessment & Plan:   Principal Problem:   GI bleed Active Problems:   Essential hypertension   NEPHROLITHIASIS, HX OF   CAD (coronary artery disease)   GERD (gastroesophageal reflux disease)   Chronic anticoagulation   Chronic atrial fibrillation (HCC)   Aspiration pneumonia (HCC)   Ileus (HCC)   Partial small bowel obstruction (HCC)   OSA (obstructive sleep apnea)  Partial small bowel obstruction due to adhesions: History of cholecystectomy Clinically improving N.p.o., IV fluids, NG tube to wall suction, surgical follow-up, small bowel series today.  Mobility. Followed by surgery.  Anticipating conservative management. Replace potassium and magnesium and keep more than 4 and more than 2 subsequently.  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  Remains on Protonix [name] mg IV twice daily.  Last dose of Eliquis on 2/5. No evidence of active bleeding, however he needs to go back on Eliquis so may need upper GI evaluation.  Followed by GI.  Left lower lobe airspace disease: Aspiration pneumonitis versus pneumonia.  Currently asymptomatic.  On Unasyn.  Blood cultures pending.   Continue until clinical improvement.  Chronic A. fib: Rate controlled on Eliquis.  Other medical problems including hypertension hyperlipidemia: Stable.  Will resume medications once patient is able to take by mouth.    DVT prophylaxis: SCDs Start: 03/06/20 1844   Code Status: Full code Family Communication: None at bedside Disposition Plan: Status is: Inpatient  Remains inpatient appropriate because:Inpatient level of care appropriate due to severity of illness   Dispo: The patient is from: Home              Anticipated d/c is to: Home              Anticipated d/c date is: 2 days              Patient currently is not medically stable to d/c.   Difficult to place patient No         Consultants:   General surgery  Gastroenterology  Procedures:   None  Antimicrobials:   Unasyn 2/6>>>   Subjective: Patient seen and examined in the morning rounds.  He denies any complaints.  No nausea or vomiting.  He had large amount of dark material after initial NG tube insertion, however after that not much.  Denies any abdominal distention.  Is passing flatus.  He had a small loose bowel movement early morning today.  Denies any chest pain or palpitations.  Objective: Vitals:   03/07/20 0539 03/07/20 0848 03/07/20 1014 03/07/20 1301  BP: 138/84  (!) 143/78 (!) 160/77  Pulse: 70 81 87 92  Resp: 18 18  20   Temp: 98.5 F (36.9 C) 98.4 F (36.9 C)  97.9 F (36.6  C)  TempSrc: Oral Axillary  Axillary  SpO2: 90% 93%  90%  Weight: 90.4 kg     Height:        Intake/Output Summary (Last 24 hours) at 03/07/2020 1414 Last data filed at 03/07/2020 0800 Gross per 24 hour  Intake -  Output 1200 ml  Net -1200 ml   Filed Weights   03/06/20 1012 03/06/20 1629 03/07/20 0539  Weight: 91.6 kg 90.4 kg 90.4 kg    Examination:  General exam: Appears calm and comfortable  Respiratory system: Clear to auscultation. Respiratory effort normal. Cardiovascular system: S1 & S2 heard,  irregularly irregular.   Gastrointestinal system: Abdomen is nondistended, soft and nontender. No organomegaly or masses felt. Normal bowel sounds heard. No localized rigidity or guarding. NG tube with coffee-ground material, minimal drainage. Central nervous system: Alert and oriented. No focal neurological deficits. Extremities: Symmetric 5 x 5 power. Skin: No rashes, lesions or ulcers Psychiatry: Judgement and insight appear normal. Mood & affect appropriate.     Data Reviewed: I have personally reviewed following labs and imaging studies  CBC: Recent Labs  Lab 03/06/20 1023 03/06/20 1737 03/07/20 0023 03/07/20 0601 03/07/20 1148  WBC 16.9*  --   --  10.3  --   NEUTROABS 13.8*  --   --  6.5  --   HGB 19.0* 17.2* 15.6 17.0 16.9  HCT 56.8* 53.3* 48.2 50.2 50.1  MCV 88.5  --   --  89.5  --   PLT 265  --   --  193  --    Basic Metabolic Panel: Recent Labs  Lab 03/06/20 1023 03/07/20 0601  NA 139 143  K 3.7 3.7  CL 99 107  CO2 27 24  GLUCOSE 144* 88  BUN 16 14  CREATININE 1.[name]* 1.41*  CALCIUM 9.7 8.9  MG  --  1.8   GFR: Estimated Creatinine Clearance: 46.6 mL/min (A) (by C-G formula based on SCr of 1.41 mg/dL (H)). Liver Function Tests: Recent Labs  Lab 03/06/20 1023 03/07/20 0601  AST 26 18  ALT 16 9  ALKPHOS 85 64  BILITOT 2.5* 2.4*  PROT 7.7 5.8*  ALBUMIN 4.2 3.1*   No results for input(s): LIPASE, AMYLASE in the last 168 hours. No results for input(s): AMMONIA in the last 168 hours. Coagulation Profile: Recent Labs  Lab 03/06/20 1023 03/06/20 1205  INR 1.2 1.3*   Cardiac Enzymes: No results for input(s): CKTOTAL, CKMB, CKMBINDEX, TROPONINI in the last 168 hours. BNP (last 3 results) No results for input(s): PROBNP in the last 8760 hours. HbA1C: Recent Labs    03/07/20 0023  HGBA1C 5.9*   CBG: Recent Labs  Lab 03/07/20 0810 03/07/20 1159  GLUCAP 94 89   Lipid Profile: No results for input(s): CHOL, HDL, LDLCALC, TRIG, CHOLHDL,  LDLDIRECT in the last 72 hours. Thyroid Function Tests: Recent Labs    03/06/20 1737  TSH 2.291   Anemia Panel: No results for input(s): VITAMINB12, FOLATE, FERRITIN, TIBC, IRON, RETICCTPCT in the last 72 hours. Sepsis Labs: Recent Labs  Lab 03/06/20 1802  LATICACIDVEN 1.4    Recent Results (from the past 240 hour(s))  SARS Coronavirus 2 by RT PCR (hospital order, performed in [facility] hospital lab) Nasopharyngeal Nasopharyngeal Swab     Status: None   Collection Time: 03/06/20 10:56 AM   Specimen: Nasopharyngeal Swab  Result Value Ref Range Status   SARS Coronavirus 2 NEGATIVE NEGATIVE Final    Comment: (NOTE) SARS-CoV-2 target nucleic acids are NOT  DETECTED.  The SARS-CoV-2 RNA is generally detectable in upper and lower respiratory specimens during the acute phase of infection. The lowest concentration of SARS-CoV-2 viral copies this assay can detect is 250 copies / mL. A negative result does not preclude SARS-CoV-2 infection and should not be used as the sole basis for treatment or other patient management decisions.  A negative result may occur with improper specimen collection / handling, submission of specimen other than nasopharyngeal swab, presence of viral mutation(s) within the areas targeted by this assay, and inadequate number of viral copies (<250 copies / mL). A negative result must be combined with clinical observations, patient history, and epidemiological information.  Fact Sheet for Patients:   StrictlyIdeas.no  Fact Sheet for Healthcare Providers: BankingDealers.co.za  This test is not yet approved or  cleared by the [place] FDA and has been authorized for detection and/or diagnosis of SARS-CoV-2 by FDA under an Emergency Use Authorization (EUA).  This EUA will remain in effect (meaning this test can be used) for the duration of the COVID-19 declaration under Section 564(b)(1) of the Act, 21  U.S.C. section 360bbb-3(b)(1), unless the authorization is terminated or revoked sooner.  Performed at [facility], [address] [place], [place] [address]   Culture, blood (Routine X 2) w Reflex to ID Panel     Status: None (Preliminary result)   Collection Time: 03/06/20  6:17 PM   Specimen: BLOOD  Result Value Ref Range Status   Specimen Description BLOOD RIGHT ANTECUBITAL  Final   Special Requests   Final    BOTTLES DRAWN AEROBIC ONLY Blood Culture adequate volume   Culture   Final    NO GROWTH < 24 HOURS Performed at [facility], [place] [address], [place], [place] [address]    Report Status PENDING  Incomplete  Culture, blood (Routine X 2) w Reflex to ID Panel     Status: None (Preliminary result)   Collection Time: 03/06/20  6:17 PM   Specimen: BLOOD RIGHT HAND  Result Value Ref Range Status   Specimen Description BLOOD RIGHT HAND  Final   Special Requests   Final    BOTTLES DRAWN AEROBIC ONLY Blood Culture adequate volume   Culture   Final    NO GROWTH < 24 HOURS Performed at [facility], [place] [address] [place], [place] [address]    Report Status PENDING  Incomplete         Radiology Studies: DG Abdomen 1 View  Result Date: 03/06/2020 CLINICAL DATA:  NG tube placement. EXAM: ABDOMEN - 1 VIEW COMPARISON:  CT abdomen pelvis-earlier same day FINDINGS: Enteric tube side port projected the expected location the gastroesophageal junction. Moderate to marked gaseous distension of several loops of small bowel with index loop of small bowel in the left mid hemiabdomen measuring 4.8 cm in diameter. This finding is associated with a conspicuous paucity of distal colonic gas. No supine evidence of pneumoperitoneum. No pneumatosis or portal venous gas. Post cholecystectomy. Calcifications overlies expected location of the bilateral renal fossa compatible with known nephrolithiasis. No acute osseous abnormalities. IMPRESSION: 1. Enteric tube side port  projects over the expected location of the gastroesophageal junction. Advancement approximately 10 cm is. 2. Similar findings again worrisome for least partial small bowel obstruction. 3. Bilateral nephrolithiasis as was demonstrated on preceding abdominal CT. Electronically Signed   By: Sandi Mariscal M.D.   On: 03/06/2020 13:49   CT ABDOMEN PELVIS W CONTRAST  Result Date:  03/06/2020 CLINICAL DATA:  Generalized abdominal pain, bloating. EXAM: CT ABDOMEN AND PELVIS WITH CONTRAST TECHNIQUE: Multidetector CT imaging of the abdomen and pelvis was performed using the standard protocol following bolus administration of intravenous contrast. CONTRAST:  126mL OMNIPAQUE IOHEXOL 300 MG/ML  SOLN COMPARISON:  December 24, 2018. FINDINGS: Lower chest: Focal left lower lobe airspace opacity is noted concerning for pneumonia. Hepatobiliary: No focal liver abnormality is seen. Status post cholecystectomy. No biliary dilatation. Pancreas: Unremarkable. No pancreatic ductal dilatation or surrounding inflammatory changes. Spleen: Normal in size without focal abnormality. Adrenals/Urinary Tract: Adrenal glands appear normal. Bilateral nonobstructive nephrolithiasis is noted. Bilateral renal cortical scarring is noted. Bilateral renal cysts are noted. No hydronephrosis or renal obstruction is noted. Urinary bladder is unremarkable. Stomach/Bowel: Mildly distended and fluid-filled stomach is noted. Mild proximal small bowel dilatation is noted without definite transition zone; is uncertain if this represents ileus or possibly partial small bowel obstruction. No colonic dilatation is noted. Sigmoid diverticulosis is noted without inflammation. The stomach appears normal. Vascular/Lymphatic: Aortic atherosclerosis. No enlarged abdominal or pelvic lymph nodes. Reproductive: Stable mild prostatic enlargement is noted. Other: No abdominal wall hernia or abnormality. No abdominopelvic ascites. Musculoskeletal: No acute or significant osseous  findings. IMPRESSION: 1. Focal left lower lobe airspace opacity is noted concerning for pneumonia. 2. Bilateral nonobstructive nephrolithiasis. No hydronephrosis or renal obstruction is noted. 3. Sigmoid diverticulosis without inflammation. 4. Stable mild prostatic enlargement. 5. Mild proximal small bowel dilatation is noted without definite transition zone; is uncertain if this represents ileus or possibly partial small bowel obstruction. 6. Aortic atherosclerosis. Aortic Atherosclerosis (ICD10-I70.0). Electronically Signed   By: Marijo Conception M.D.   On: 03/06/2020 11:46   DG Chest Portable 1 View  Result Date: 03/06/2020 CLINICAL DATA:  Advancement of nasogastric tube. EXAM: PORTABLE CHEST 1 VIEW COMPARISON:  Same day. FINDINGS: Distal tip of nasogastric tube is seen at the expected position of the gastroesophageal junction. This is not significantly changed compared to prior exam. IMPRESSION: No significant change compared to prior exam. Distal tip of nasogastric tube seen at the expected position of the gastroesophageal junction. Electronically Signed   By: Marijo Conception M.D.   On: 03/06/2020 14:47   DG Abd Portable 1V  Result Date: 03/07/2020 CLINICAL DATA:  Small bowel obstruction, vomiting, enteric tube EXAM: PORTABLE ABDOMEN - 1 VIEW COMPARISON:  03/06/2020 abdominal radiograph FINDINGS: Enteric tube terminates in proximal stomach. A few mildly dilated central and left small bowel loops, improved, for example 3.8 cm diameter, previously 4.8 cm. No evidence of pneumatosis or pneumoperitoneum. No radiopaque nephrolithiasis. Cholecystectomy clips are seen in the right upper quadrant of the abdomen. IMPRESSION: Enteric tube terminates in the proximal stomach. A few mildly dilated central and left small bowel loops, improved, compatible with improving small bowel obstruction. Electronically Signed   By: Ilona Sorrel M.D.   On: 03/07/2020 09:22        Scheduled Meds: . allopurinol  100 mg Oral  Daily  . atorvastatin  10 mg Oral Daily  . chlorhexidine  15 mL Mouth Rinse BID  . diatrizoate meglumine-sodium  90 mL Per NG tube Once  . diltiazem  240 mg Oral Daily  . mouth rinse  15 mL Mouth Rinse q12n4p  . metoprolol succinate  100 mg Oral BID  . pantoprazole (PROTONIX) IV  [name] mg Intravenous Q12H   Continuous Infusions: . sodium chloride Stopped (03/06/20 1305)  . sodium chloride 75 mL/hr at 03/06/20 1801  . sodium chloride    .  ampicillin-sulbactam (UNASYN) IV 3 g (03/07/20 1134)  . magnesium sulfate bolus IVPB    . potassium chloride       LOS: 1 day    Time spent: 35 minutes    Barb Merino, MD [facility] Pager [phone]

## 2020-03-07 NOTE — Consult Note (Addendum)
Consultation  Referring Provider: Dr. Sloan Leiter    Primary Care Physician:  Brunetta Jeans, PA-C Primary Gastroenterologist:   Dr. Fuller Plan      Reason for Consultation: Coffee-ground emesis and bowel obstruction            HPI:   [name] [name] is a 80 y.o. male with a past medical history of CAD, A. fib on Eliquis, sleep apnea and multiple others listed below who presented to the ER on 03/06/2020 with abdominal cramping and dark coffee-ground emesis.    Today, the patient describes that he woke up on Saturday, 03/05/2020 and felt very bloated and proceeded to try and have a bowel movement, eventually he pushed on out but it was solid which is very abnormal for him due to his chronically loose stools and continued with abdominal pain after that describing increasing abdominal distention over the day, due to this patient did not eat anything. He tried to go to sleep later that evening but still had some abdominal discomfort and felt like his heart was "flip-flopping in my chest". He had to get up from his sleep and started vomiting and did so about every 15 minutes, the initial episode of vomitus he saw "coffee-ground" material. This continued into the next morning and patient's wife told him to come to the ER. Since being here he has had no further nausea and vomiting since NG tube placement. Tells me that he did have a stool this morning which was a mixture of liquid and solid. His abdominal distension is better per him and he feels slightly better.    Denies fever, chills, weight loss or use of NSAIDs.  GI history: 11/26/2018 colonoscopy -One 8 mm polyp in the cecum, removed with a hot snare. Resected and retrieved. - Two 6 to 7 mm polyps in the descending colon and in the ascending colon, removed with a cold snare. Resected and retrieved. - One 3 mm polyp in the descending colon, removed with a cold biopsy forceps. Resected and retrieved. - Two non-bleeding colonic angiodysplastic  lesions. - Moderate diverticulosis in the left colon. - The examination was otherwise normal on direct and retroflexion views.  05/11/2015 EGD - Mild Schatzki ring. - A single gastric polyp. Resected and retrieved. - Small hiatal hernia. - Non-bleeding duodenal diverticulum.  Past Medical History:  Diagnosis Date  . Ankylosing spondylitis ([place])   . Aortic atherosclerosis ([place])   . Arthritis   . Bilateral inguinal hernia   . CAD (coronary artery disease) 9470,9628   30% mid LAD lesion on cardiac cath  . Chicken pox   . Cholelithiasis   . Diverticulosis 11/26/2018   Moderate, Left Noted on Colonoscopy  . Dysrhythmia    atrial fibrillation  . Gallstones   . GERD (gastroesophageal reflux disease)   . [place] measles   . Hiatal hernia   . History of colon polyps 11/26/2018  . History of kidney stones   . HLD (hyperlipidemia)   . HTN (hypertension)   . Hypertension   . Nephrolithiasis   . Persistent atrial fibrillation ([place])   . Sleep apnea    No CPAP  . 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 Ventricular hypertrophy     Past Surgical History:  Procedure Laterality Date  . CARDIAC CATHETERIZATION  03/2012   30% mid LAD lesion otherwise normal cors, LVEF 65%  . CHOLECYSTECTOMY    . COLONOSCOPY  11/26/2018  . CYSTOSCOPY WITH RETROGRADE PYELOGRAM, URETEROSCOPY AND STENT PLACEMENT Right  01/02/2019   Procedure: CYSTOSCOPY WITH RETROGRADE PYELOGRAM, URETEROSCOPY AND STENT PLACEMENT;  Surgeon: Alexis Frock, MD;  Location: [facility];  Service: Urology;  Laterality: Right;  1 HR  . ELBOW ARTHROPLASTY Left   . HAND SURGERY Left   . HOLMIUM LASER APPLICATION Right 64/04/345   Procedure: HOLMIUM LASER APPLICATION;  Surgeon: Alexis Frock, MD;  Location: [facility];  Service: Urology;  Laterality: Right;  . KNEE SURGERY     x2 right  . LEFT AND RIGHT HEART CATHETERIZATION WITH CORONARY ANGIOGRAM N/A 03/24/2012   Procedure: LEFT AND RIGHT HEART CATHETERIZATION WITH CORONARY ANGIOGRAM;   Surgeon: Sherren Mocha, MD;  Location: [facility] CATH LAB;  Service: Cardiovascular;  Laterality: N/A;  . LEG FLUID REMOVAL RIGHT    . TONSILLECTOMY    . UPPER GI ENDOSCOPY      Family History  Problem Relation Age of Onset  . Heart disease Father 73       Deceased  . Heart attack Father 20  . Heart disease Brother 35       "Died in his sleep"  . Arthritis/Rheumatoid Mother        Deceased-86  . Hyperlipidemia Brother   . Stroke Brother 29       Deceased  . Other Sister        Estate agent in [place]  . Other Sister        MVA  . Other Brother        Carjacked-killed  . Dementia Paternal Grandmother   . Cancer Paternal Uncle   . Healthy Brother   . Healthy Son        X3  . Healthy Daughter        x1  . Colon cancer Neg Hx   . Esophageal cancer Neg Hx   . Stomach cancer Neg Hx   . Rectal cancer Neg Hx     Social History   Tobacco Use  . Smoking status: Never Smoker  . Smokeless tobacco: Never Used  Vaping Use  . Vaping Use: Never used  Substance Use Topics  . Alcohol use: No  . Drug use: No    Prior to Admission medications   Medication Sig Start Date End Date Taking? Authorizing Provider  allopurinol (ZYLOPRIM) 100 MG tablet TAKE 1 TABLET EVERY DAY Patient taking differently: Take 100 mg by mouth daily. 11/11/19  Yes Brunetta Jeans, PA-C  atorvastatin (LIPITOR) 10 MG tablet TAKE 1 TABLET EVERY DAY Patient taking differently: Take 10 mg by mouth daily. 11/11/19  Yes Brunetta Jeans, PA-C  diltiazem (CARDIZEM CD) 240 MG 24 hr capsule Take 1 capsule (240 mg total) by mouth daily. Please call to schedule appointment with Dr. Percival Spanish in January 2022 for refills. 11/11/19  Yes Hochrein, Jeneen Rinks, MD  ELIQUIS 5 MG TABS tablet TAKE 1 TABLET BY MOUTH 2 TIMES DAILY. Patient taking differently: Take 5 mg by mouth 2 (two) times daily. 04/29/19  Yes Minus Breeding, MD  furosemide (LASIX) 20 MG tablet Take 1 tablet (20 mg total) by mouth daily. Please call to schedule appointment with  Dr. Percival Spanish in January 2022 for refills. 11/11/19  Yes Minus Breeding, MD  HYDROcodone-acetaminophen (NORCO) 10-325 MG tablet Take 1 tablet by mouth every 6 (six) hours as needed. Patient taking differently: Take 1 tablet by mouth every 6 (six) hours as needed (pain). 02/11/20  Yes Brunetta Jeans, PA-C  metoprolol succinate (TOPROL-XL) 100 MG 24 hr tablet TAKE 1 TABLET  TWO TIMES DAILY  WITH OR  IMMEDIATELY FOLLOWING A MEAL. Patient taking differently: Take 100 mg by mouth 2 (two) times daily. TAKE WITH OR IMMEDIATELY FOLLOWING A MEAL. 04/21/19  Yes Hochrein, Demetrious, MD  sildenafil (REVATIO) 20 MG tablet TAKE 1 TABLET BY MOUTH AS NEEDED FOR ERECTILE DYSFUNCTION. DO NO TAKE MORE THAN 1 DOSE IN 36 HOURS. Patient taking differently: Take 20 mg by mouth daily as needed (erectile dysfunction). 09/19/17  Yes Martin, William C, PA-C    Current Facility-Administered Medications  Medication Dose Route Frequency Provider Last Rate Last Admin  . 0.9 %  sodium chloride infusion   Intravenous PRN Li, Na, MD   Stopped at 03/06/20 1305  . 0.9 %  sodium chloride infusion   Intravenous Continuous Li, Na, MD 75 mL/hr at 03/06/20 1801 New Bag at 03/06/20 1801  . acetaminophen (TYLENOL) tablet 650 mg  650 mg Oral Q6H PRN Li, Na, MD       Or  . acetaminophen (TYLENOL) suppository 650 mg  650 mg Rectal Q6H PRN Li, Na, MD      . allopurinol (ZYLOPRIM) tablet 100 mg  100 mg Oral Daily Li, Na, MD   100 mg at 03/06/20 2009  . Ampicillin-Sulbactam (UNASYN) 3 g in sodium chloride 0.9 % 100 mL IVPB  3 g Intravenous Q6H Li, Na, MD 200 mL/hr at 03/07/20 0559 3 g at 03/07/20 0559  . atorvastatin (LIPITOR) tablet 10 mg  10 mg Oral Daily Li, Na, MD   10 mg at 03/06/20 2009  . chlorhexidine (PERIDEX) 0.12 % solution 15 mL  15 mL Mouth Rinse BID Ghimire, Kuber, MD      . diltiazem (CARDIZEM CD) 24 hr capsule 240 mg  240 mg Oral Daily Li, Na, MD   240 mg at 03/06/20 2009  . MEDLINE mouth rinse  15 mL Mouth Rinse q12n4p Ghimire,  Kuber, MD      . metoprolol succinate (TOPROL-XL) 24 hr tablet 100 mg  100 mg Oral BID Li, Na, MD   100 mg at 03/06/20 2009  . ondansetron (ZOFRAN) tablet 4 mg  4 mg Oral Q6H PRN Li, Na, MD       Or  . ondansetron (ZOFRAN) injection 4 mg  4 mg Intravenous Q6H PRN Li, Na, MD      . pantoprazole (PROTONIX) injection 40 mg  40 mg Intravenous Q12H Li, Na, MD   40 mg at 03/06/20 2100    Allergies as of 03/06/2020 - Review Complete 03/06/2020  Allergen Reaction Noted  . Coumadin [warfarin] Other (See Comments) 05/04/2014     Review of Systems:    Constitutional: No weight loss, fever or chills Skin: No rash Cardiovascular: No chest pain Respiratory: No SOB Gastrointestinal: See HPI and otherwise negative Genitourinary: No dysuria Neurological: No headache, dizziness or syncope Musculoskeletal: No new muscle or joint pain Hematologic: No bleeding  Psychiatric: No history of depression or anxiety    Physical Exam:  Vital signs in last 24 hours: Temp:  [97.7 F (36.5 C)-98.5 F (36.9 C)] 98.4 F (36.9 C) (02/07 0848) Pulse Rate:  [32-110] 81 (02/07 0848) Resp:  [16-23] 18 (02/07 0848) BP: (122-167)/(61-97) 138/84 (02/07 0539) SpO2:  [90 %-100 %] 93 % (02/07 0848) Weight:  [90.4 kg-91.6 kg] 90.4 kg (02/07 0539) Last BM Date: 03/06/20 General:   Pleasant Caucasian male appears to be in NAD, Well developed, Well nourished, alert and cooperative Head:  Normocephalic and atraumatic. Eyes:   PEERL, EOMI. No icterus. Conjunctiva pink. Ears:  Normal auditory acuity. Neck:    Supple Throat: Oral cavity and pharynx without inflammation, swelling or lesion. +NG tube in place to wall with LIS- 50cc coffee ground material in cannister Lungs: Respirations even and unlabored. Lungs clear to auscultation bilaterally.   No wheezes, crackles, or rhonchi.  Heart: Normal S1, S2. No MRG. Regular rate and rhythm. No peripheral edema, cyanosis or pallor.  Abdomen:  Soft, mild distension, nontender. No  rebound or guarding. Decreased BS all four quadrants, No appreciable masses or hepatomegaly. Rectal:  Not performed.  Msk:  Symmetrical without gross deformities. Peripheral pulses intact.  Extremities:  Without edema, no deformity or joint abnormality.  Neurologic:  Alert and  oriented x4;  grossly normal neurologically.  Skin:   Dry and intact without significant lesions or rashes. Psychiatric: Demonstrates good judgement and reason without abnormal affect or behaviors.   LAB RESULTS: Recent Labs    03/06/20 1023 03/06/20 1737 03/07/20 0023 03/07/20 0601  WBC 16.9*  --   --  10.3  HGB 19.0* 17.2* 15.6 17.0  HCT 56.8* 53.3* 48.2 50.2  PLT 265  --   --  193   BMET Recent Labs    03/06/20 1023 03/07/20 0601  NA 139 143  K 3.7 3.7  CL 99 107  CO2 27 24  GLUCOSE 144* 88  BUN 16 14  CREATININE 1.40* 1.41*  CALCIUM 9.7 8.9   LFT Recent Labs    03/07/20 0601  PROT 5.8*  ALBUMIN 3.1*  AST 18  ALT 9  ALKPHOS 64  BILITOT 2.4*   PT/INR Recent Labs    03/06/20 1023 03/06/20 1205  LABPROT 14.5 15.4*  INR 1.2 1.3*    STUDIES: DG Abdomen 1 View  Result Date: 03/06/2020 CLINICAL DATA:  NG tube placement. EXAM: ABDOMEN - 1 VIEW COMPARISON:  CT abdomen pelvis-earlier same day FINDINGS: Enteric tube side port projected the expected location the gastroesophageal junction. Moderate to marked gaseous distension of several loops of small bowel with index loop of small bowel in the left mid hemiabdomen measuring 4.8 cm in diameter. This finding is associated with a conspicuous paucity of distal colonic gas. No supine evidence of pneumoperitoneum. No pneumatosis or portal venous gas. Post cholecystectomy. Calcifications overlies expected location of the bilateral renal fossa compatible with known nephrolithiasis. No acute osseous abnormalities. IMPRESSION: 1. Enteric tube side port projects over the expected location of the gastroesophageal junction. Advancement approximately 10 cm  is. 2. Similar findings again worrisome for least partial small bowel obstruction. 3. Bilateral nephrolithiasis as was demonstrated on preceding abdominal CT. Electronically Signed   By: Sandi Mariscal M.D.   On: 03/06/2020 13:49   CT ABDOMEN PELVIS W CONTRAST  Result Date: 03/06/2020 CLINICAL DATA:  Generalized abdominal pain, bloating. EXAM: CT ABDOMEN AND PELVIS WITH CONTRAST TECHNIQUE: Multidetector CT imaging of the abdomen and pelvis was performed using the standard protocol following bolus administration of intravenous contrast. CONTRAST:  161mL OMNIPAQUE IOHEXOL 300 MG/ML  SOLN COMPARISON:  December 24, 2018. FINDINGS: Lower chest: Focal left lower lobe airspace opacity is noted concerning for pneumonia. Hepatobiliary: No focal liver abnormality is seen. Status post cholecystectomy. No biliary dilatation. Pancreas: Unremarkable. No pancreatic ductal dilatation or surrounding inflammatory changes. Spleen: Normal in size without focal abnormality. Adrenals/Urinary Tract: Adrenal glands appear normal. Bilateral nonobstructive nephrolithiasis is noted. Bilateral renal cortical scarring is noted. Bilateral renal cysts are noted. No hydronephrosis or renal obstruction is noted. Urinary bladder is unremarkable. Stomach/Bowel: Mildly distended and fluid-filled stomach is noted. Mild proximal small bowel dilatation is noted  without definite transition zone; is uncertain if this represents ileus or possibly partial small bowel obstruction. No colonic dilatation is noted. Sigmoid diverticulosis is noted without inflammation. The stomach appears normal. Vascular/Lymphatic: Aortic atherosclerosis. No enlarged abdominal or pelvic lymph nodes. Reproductive: Stable mild prostatic enlargement is noted. Other: No abdominal wall hernia or abnormality. No abdominopelvic ascites. Musculoskeletal: No acute or significant osseous findings. IMPRESSION: 1. Focal left lower lobe airspace opacity is noted concerning for pneumonia. 2.  Bilateral nonobstructive nephrolithiasis. No hydronephrosis or renal obstruction is noted. 3. Sigmoid diverticulosis without inflammation. 4. Stable mild prostatic enlargement. 5. Mild proximal small bowel dilatation is noted without definite transition zone; is uncertain if this represents ileus or possibly partial small bowel obstruction. 6. Aortic atherosclerosis. Aortic Atherosclerosis (ICD10-I70.0). Electronically Signed   By: Marijo Conception M.D.   On: 03/06/2020 11:46   DG Chest Portable 1 View  Result Date: 03/06/2020 CLINICAL DATA:  Advancement of nasogastric tube. EXAM: PORTABLE CHEST 1 VIEW COMPARISON:  Same day. FINDINGS: Distal tip of nasogastric tube is seen at the expected position of the gastroesophageal junction. This is not significantly changed compared to prior exam. IMPRESSION: No significant change compared to prior exam. Distal tip of nasogastric tube seen at the expected position of the gastroesophageal junction. Electronically Signed   By: Marijo Conception M.D.   On: 03/06/2020 14:47   DG Abd Portable 1V  Result Date: 03/07/2020 CLINICAL DATA:  Small bowel obstruction, vomiting, enteric tube EXAM: PORTABLE ABDOMEN - 1 VIEW COMPARISON:  03/06/2020 abdominal radiograph FINDINGS: Enteric tube terminates in proximal stomach. A few mildly dilated central and left small bowel loops, improved, for example 3.8 cm diameter, previously 4.8 cm. No evidence of pneumatosis or pneumoperitoneum. No radiopaque nephrolithiasis. Cholecystectomy clips are seen in the right upper quadrant of the abdomen. IMPRESSION: Enteric tube terminates in the proximal stomach. A few mildly dilated central and left small bowel loops, improved, compatible with improving small bowel obstruction. Electronically Signed   By: Ilona Sorrel M.D.   On: 03/07/2020 09:22    Impression / Plan:   Impression: 1.  Small bowel obstruction: Ileus versus small bowel obstruction, either way seems to be improving after NG tube with  intermittent suction over the past 24 hours, patient did have a bowel movement this morning and x-ray as above; unknown cause 2.  Coffee-ground emesis: Started on 03/05/2020 in the evening, NG tube with 50 cc of coffee-ground, hemoglobin stable; consider daily versus other 3.  A. fib on Eliquis: Last dose of Eliquis 03/05/2018 2 in the evening 4.  Left lower lobe aspiration pneumonitis versus pneumonia  Plan: 1.  Bowel obstruction seems to be improving over the past 24 hours, likely to proceed with EGD tomorrow.  I went ahead and schedule patient for EGD in the morning with Dr. Havery Moros.  Did discuss risk and benefits, limitations and alternatives and patient agrees to proceed. 2.  Continue supportive measures 3.  Continue monitor hemoglobin 4.  Please await further recommendations from Dr. Havery Moros later today.  Thank you for your kind consultation, we will continue to follow.  Lavone Nian [facility]  03/07/2020, 9:54 AM

## 2020-03-07 NOTE — Progress Notes (Signed)
Subjective: CC: Patient reports that on Friday he began having dark and tarry stools.  Notes decreased appetite on Saturday followed by abdominal distension and n/v.  Still was passing some flatus and had a small BM Saturday Yesterday distension, n/v worsened. He had one episode of flatus along with a small liquid mixed w/ hard solid stool yesterday around 6pm. No further flatus or bm.  Notes only prior abdominal surgery is Lap Chole. No hx of SBO Currently w/ NGT that has brown liquid in cannister. No nausea. No abdominal pain. Still some bloating.  He has not ambulated today.  Believes the NGT was advanced after initial xray yesterday  Objective: Vital signs in last 24 hours: Temp:  [97.7 F (36.5 C)-98.5 F (36.9 C)] 98.4 F (36.9 C) (02/07 0848) Pulse Rate:  [32-110] 81 (02/07 0848) Resp:  [16-23] 18 (02/07 0848) BP: (122-167)/(61-97) 138/84 (02/07 0539) SpO2:  [90 %-100 %] 93 % (02/07 0848) Weight:  [90.4 kg-91.6 kg] 90.4 kg (02/07 0539) Last BM Date: 03/06/20  Intake/Output from previous day: 02/06 0701 - 02/07 0700 In: 602.4 [I.V.:2.2; IV Piggyback:600.2] Out: 900  Intake/Output this shift: Total I/O In: -  Out: 300 [Urine:300]  PE: Gen:  Alert, NAD, pleasant Abd: Soft, mild to moderate distension, NT, hypoactive BS. NGT with brown liquid in cannister.  Ext:  No LE edema Psych: A&Ox3  Skin: no rashes noted, warm and dry  Lab Results:  Recent Labs    03/06/20 1023 03/06/20 1737 03/07/20 0023 03/07/20 0601  WBC 16.9*  --   --  10.3  HGB 19.0*   < > 15.6 17.0  HCT 56.8*   < > 48.2 50.2  PLT 265  --   --  193   < > = values in this interval not displayed.   BMET Recent Labs    03/06/20 1023 03/07/20 0601  NA 139 143  K 3.7 3.7  CL 99 107  CO2 27 24  GLUCOSE 144* 88  BUN 16 14  CREATININE 1.40* 1.41*  CALCIUM 9.7 8.9   PT/INR Recent Labs    03/06/20 1023 03/06/20 1205  LABPROT 14.5 15.4*  INR 1.2 1.3*   CMP     Component Value  Date/Time   NA 143 03/07/2020 0601   NA 142 02/18/2020 0953   K 3.7 03/07/2020 0601   CL 107 03/07/2020 0601   CO2 24 03/07/2020 0601   GLUCOSE 88 03/07/2020 0601   BUN 14 03/07/2020 0601   BUN 19 02/18/2020 0953   CREATININE 1.41 (H) 03/07/2020 0601   CREATININE 1.77 (H) 12/24/2018 0949   CALCIUM 8.9 03/07/2020 0601   PROT 5.8 (L) 03/07/2020 0601   PROT 6.6 02/18/2020 0953   ALBUMIN 3.1 (L) 03/07/2020 0601   ALBUMIN 4.2 02/18/2020 0953   AST 18 03/07/2020 0601   ALT 9 03/07/2020 0601   ALKPHOS 64 03/07/2020 0601   BILITOT 2.4 (H) 03/07/2020 0601   BILITOT 2.4 (H) 02/18/2020 0953   GFRNONAA 51 (L) 03/07/2020 0601   GFRAA 57 (L) 02/18/2020 0953   Lipase     Component Value Date/Time   LIPASE 34 06/22/2008 0334       Studies/Results: DG Abdomen 1 View  Result Date: 03/06/2020 CLINICAL DATA:  NG tube placement. EXAM: ABDOMEN - 1 VIEW COMPARISON:  CT abdomen pelvis-earlier same day FINDINGS: Enteric tube side port projected the expected location the gastroesophageal junction. Moderate to marked gaseous distension of several loops of small bowel  with index loop of small bowel in the left mid hemiabdomen measuring 4.8 cm in diameter. This finding is associated with a conspicuous paucity of distal colonic gas. No supine evidence of pneumoperitoneum. No pneumatosis or portal venous gas. Post cholecystectomy. Calcifications overlies expected location of the bilateral renal fossa compatible with known nephrolithiasis. No acute osseous abnormalities. IMPRESSION: 1. Enteric tube side port projects over the expected location of the gastroesophageal junction. Advancement approximately 10 cm is. 2. Similar findings again worrisome for least partial small bowel obstruction. 3. Bilateral nephrolithiasis as was demonstrated on preceding abdominal CT. Electronically Signed   By: Sandi Mariscal M.D.   On: 03/06/2020 13:49   CT ABDOMEN PELVIS W CONTRAST  Result Date: 03/06/2020 CLINICAL DATA:   Generalized abdominal pain, bloating. EXAM: CT ABDOMEN AND PELVIS WITH CONTRAST TECHNIQUE: Multidetector CT imaging of the abdomen and pelvis was performed using the standard protocol following bolus administration of intravenous contrast. CONTRAST:  127mL OMNIPAQUE IOHEXOL 300 MG/ML  SOLN COMPARISON:  December 24, 2018. FINDINGS: Lower chest: Focal left lower lobe airspace opacity is noted concerning for pneumonia. Hepatobiliary: No focal liver abnormality is seen. Status post cholecystectomy. No biliary dilatation. Pancreas: Unremarkable. No pancreatic ductal dilatation or surrounding inflammatory changes. Spleen: Normal in size without focal abnormality. Adrenals/Urinary Tract: Adrenal glands appear normal. Bilateral nonobstructive nephrolithiasis is noted. Bilateral renal cortical scarring is noted. Bilateral renal cysts are noted. No hydronephrosis or renal obstruction is noted. Urinary bladder is unremarkable. Stomach/Bowel: Mildly distended and fluid-filled stomach is noted. Mild proximal small bowel dilatation is noted without definite transition zone; is uncertain if this represents ileus or possibly partial small bowel obstruction. No colonic dilatation is noted. Sigmoid diverticulosis is noted without inflammation. The stomach appears normal. Vascular/Lymphatic: Aortic atherosclerosis. No enlarged abdominal or pelvic lymph nodes. Reproductive: Stable mild prostatic enlargement is noted. Other: No abdominal wall hernia or abnormality. No abdominopelvic ascites. Musculoskeletal: No acute or significant osseous findings. IMPRESSION: 1. Focal left lower lobe airspace opacity is noted concerning for pneumonia. 2. Bilateral nonobstructive nephrolithiasis. No hydronephrosis or renal obstruction is noted. 3. Sigmoid diverticulosis without inflammation. 4. Stable mild prostatic enlargement. 5. Mild proximal small bowel dilatation is noted without definite transition zone; is uncertain if this represents ileus or  possibly partial small bowel obstruction. 6. Aortic atherosclerosis. Aortic Atherosclerosis (ICD10-I70.0). Electronically Signed   By: Marijo Conception M.D.   On: 03/06/2020 11:46   DG Chest Portable 1 View  Result Date: 03/06/2020 CLINICAL DATA:  Advancement of nasogastric tube. EXAM: PORTABLE CHEST 1 VIEW COMPARISON:  Same day. FINDINGS: Distal tip of nasogastric tube is seen at the expected position of the gastroesophageal junction. This is not significantly changed compared to prior exam. IMPRESSION: No significant change compared to prior exam. Distal tip of nasogastric tube seen at the expected position of the gastroesophageal junction. Electronically Signed   By: Marijo Conception M.D.   On: 03/06/2020 14:47    Anti-infectives: Anti-infectives (From admission, onward)   Start     Dose/Rate Route Frequency Ordered Stop   03/06/20 1800  Ampicillin-Sulbactam (UNASYN) 3 g in sodium chloride 0.9 % 100 mL IVPB        3 g 200 mL/hr over 30 Minutes Intravenous Every 6 hours 03/06/20 1724     03/06/20 1200  Ampicillin-Sulbactam (UNASYN) 3 g in sodium chloride 0.9 % 100 mL IVPB        3 g 200 mL/hr over 30 Minutes Intravenous  Once 03/06/20 1159 03/06/20 1305  Assessment/Plan CAD A. Fib on Eliquis OSA HTN HLD LLL PNA - On Unasyn per [facility] Diverticulosis - No diverticulitis on CT  Melena & Coffee ground emesis - hgb stable at 17. Fecal occult negative. [facility] has consulted GI  SBO vs pSBO - Hx of Lap Chole. No prior hx of SBO's - Continue NPO and NGT to LIWS. Will get Xray this AM to confirm advancement and that it is in proper position before initiating SBO protocol.  - Keep K>4 and Mg > 2 for bowel function - Mobilize for bowel function - No indication for emergency surgery currently. Hopefully will improve with conservative tx. If he does not improve with conservative tx, he may need surgery during admission.   FEN - NPO, NGT, IVF per [facility] VTE - SCDs, anticoagulation and prophylaxis  on hold for reported melena and coffee ground emesis ID - Unasyn per [facility] for PNA   LOS: 1 day    Jillyn Ledger , [facility] Surgery 03/07/2020, 9:04 AM Please see Amion for pager number during day hours 7:00am-4:30pm

## 2020-03-07 NOTE — H&P (View-Only) (Signed)
Consultation  Referring Provider: Dr. Sloan Leiter    Primary Care Physician:  Brunetta Jeans, PA-C Primary Gastroenterologist:   Dr. Fuller Plan      Reason for Consultation: Coffee-ground emesis and bowel obstruction            HPI:   [name] [name] is a 80 y.o. male with a past medical history of CAD, A. fib on Eliquis, sleep apnea and multiple others listed below who presented to the ER on 03/06/2020 with abdominal cramping and dark coffee-ground emesis.    Today, the patient describes that he woke up on Saturday, 03/05/2020 and felt very bloated and proceeded to try and have a bowel movement, eventually he pushed on out but it was solid which is very abnormal for him due to his chronically loose stools and continued with abdominal pain after that describing increasing abdominal distention over the day, due to this patient did not eat anything. He tried to go to sleep later that evening but still had some abdominal discomfort and felt like his heart was "flip-flopping in my chest". He had to get up from his sleep and started vomiting and did so about every 15 minutes, the initial episode of vomitus he saw "coffee-ground" material. This continued into the next morning and patient's wife told him to come to the ER. Since being here he has had no further nausea and vomiting since NG tube placement. Tells me that he did have a stool this morning which was a mixture of liquid and solid. His abdominal distension is better per him and he feels slightly better.    Denies fever, chills, weight loss or use of NSAIDs.  GI history: 11/26/2018 colonoscopy -One 8 mm polyp in the cecum, removed with a hot snare. Resected and retrieved. - Two 6 to 7 mm polyps in the descending colon and in the ascending colon, removed with a cold snare. Resected and retrieved. - One 3 mm polyp in the descending colon, removed with a cold biopsy forceps. Resected and retrieved. - Two non-bleeding colonic angiodysplastic  lesions. - Moderate diverticulosis in the left colon. - The examination was otherwise normal on direct and retroflexion views.  05/11/2015 EGD - Mild Schatzki ring. - A single gastric polyp. Resected and retrieved. - Small hiatal hernia. - Non-bleeding duodenal diverticulum.  Past Medical History:  Diagnosis Date  . Ankylosing spondylitis ([place])   . Aortic atherosclerosis ([place])   . Arthritis   . Bilateral inguinal hernia   . CAD (coronary artery disease) 9470,9628   30% mid LAD lesion on cardiac cath  . Chicken pox   . Cholelithiasis   . Diverticulosis 11/26/2018   Moderate, Left Noted on Colonoscopy  . Dysrhythmia    atrial fibrillation  . Gallstones   . GERD (gastroesophageal reflux disease)   . [place] measles   . Hiatal hernia   . History of colon polyps 11/26/2018  . History of kidney stones   . HLD (hyperlipidemia)   . HTN (hypertension)   . Hypertension   . Nephrolithiasis   . Persistent atrial fibrillation ([place])   . Sleep apnea    No CPAP  . 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 Ventricular hypertrophy     Past Surgical History:  Procedure Laterality Date  . CARDIAC CATHETERIZATION  03/2012   30% mid LAD lesion otherwise normal cors, LVEF 65%  . CHOLECYSTECTOMY    . COLONOSCOPY  11/26/2018  . CYSTOSCOPY WITH RETROGRADE PYELOGRAM, URETEROSCOPY AND STENT PLACEMENT Right  01/02/2019   Procedure: CYSTOSCOPY WITH RETROGRADE PYELOGRAM, URETEROSCOPY AND STENT PLACEMENT;  Surgeon: Alexis Frock, MD;  Location: [facility];  Service: Urology;  Laterality: Right;  1 HR  . ELBOW ARTHROPLASTY Left   . HAND SURGERY Left   . HOLMIUM LASER APPLICATION Right 64/04/345   Procedure: HOLMIUM LASER APPLICATION;  Surgeon: Alexis Frock, MD;  Location: [facility];  Service: Urology;  Laterality: Right;  . KNEE SURGERY     x2 right  . LEFT AND RIGHT HEART CATHETERIZATION WITH CORONARY ANGIOGRAM N/A 03/24/2012   Procedure: LEFT AND RIGHT HEART CATHETERIZATION WITH CORONARY ANGIOGRAM;   Surgeon: Sherren Mocha, MD;  Location: [facility] CATH LAB;  Service: Cardiovascular;  Laterality: N/A;  . LEG FLUID REMOVAL RIGHT    . TONSILLECTOMY    . UPPER GI ENDOSCOPY      Family History  Problem Relation Age of Onset  . Heart disease Father 73       Deceased  . Heart attack Father 20  . Heart disease Brother 35       "Died in his sleep"  . Arthritis/Rheumatoid Mother        Deceased-86  . Hyperlipidemia Brother   . Stroke Brother 29       Deceased  . Other Sister        Estate agent in [place]  . Other Sister        MVA  . Other Brother        Carjacked-killed  . Dementia Paternal Grandmother   . Cancer Paternal Uncle   . Healthy Brother   . Healthy Son        X3  . Healthy Daughter        x1  . Colon cancer Neg Hx   . Esophageal cancer Neg Hx   . Stomach cancer Neg Hx   . Rectal cancer Neg Hx     Social History   Tobacco Use  . Smoking status: Never Smoker  . Smokeless tobacco: Never Used  Vaping Use  . Vaping Use: Never used  Substance Use Topics  . Alcohol use: No  . Drug use: No    Prior to Admission medications   Medication Sig Start Date End Date Taking? Authorizing Provider  allopurinol (ZYLOPRIM) 100 MG tablet TAKE 1 TABLET EVERY DAY Patient taking differently: Take 100 mg by mouth daily. 11/11/19  Yes Brunetta Jeans, PA-C  atorvastatin (LIPITOR) 10 MG tablet TAKE 1 TABLET EVERY DAY Patient taking differently: Take 10 mg by mouth daily. 11/11/19  Yes Brunetta Jeans, PA-C  diltiazem (CARDIZEM CD) 240 MG 24 hr capsule Take 1 capsule (240 mg total) by mouth daily. Please call to schedule appointment with Dr. Percival Spanish in January 2022 for refills. 11/11/19  Yes Hochrein, Jeneen Rinks, MD  ELIQUIS 5 MG TABS tablet TAKE 1 TABLET BY MOUTH 2 TIMES DAILY. Patient taking differently: Take 5 mg by mouth 2 (two) times daily. 04/29/19  Yes Minus Breeding, MD  furosemide (LASIX) 20 MG tablet Take 1 tablet (20 mg total) by mouth daily. Please call to schedule appointment with  Dr. Percival Spanish in January 2022 for refills. 11/11/19  Yes Minus Breeding, MD  HYDROcodone-acetaminophen (NORCO) 10-325 MG tablet Take 1 tablet by mouth every 6 (six) hours as needed. Patient taking differently: Take 1 tablet by mouth every 6 (six) hours as needed (pain). 02/11/20  Yes Brunetta Jeans, PA-C  metoprolol succinate (TOPROL-XL) 100 MG 24 hr tablet TAKE 1 TABLET  TWO TIMES DAILY  WITH OR  IMMEDIATELY FOLLOWING A MEAL. Patient taking differently: Take 100 mg by mouth 2 (two) times daily. TAKE WITH OR IMMEDIATELY FOLLOWING A MEAL. 04/21/19  Yes Hochrein, Jeneen Rinks, MD  sildenafil (REVATIO) 20 MG tablet TAKE 1 TABLET BY MOUTH AS NEEDED FOR ERECTILE DYSFUNCTION. DO NO TAKE MORE THAN 1 DOSE IN 36 HOURS. Patient taking differently: Take 20 mg by mouth daily as needed (erectile dysfunction). 09/19/17  Yes Brunetta Jeans, PA-C    Current Facility-Administered Medications  Medication Dose Route Frequency Provider Last Rate Last Admin  . 0.9 %  sodium chloride infusion   Intravenous PRN Charlann Lange, MD   Stopped at 03/06/20 1305  . 0.9 %  sodium chloride infusion   Intravenous Continuous Nicoletta Dress, Na, MD 75 mL/hr at 03/06/20 1801 New Bag at 03/06/20 1801  . acetaminophen (TYLENOL) tablet 650 mg  650 mg Oral Q6H PRN Nicoletta Dress, Na, MD       Or  . acetaminophen (TYLENOL) suppository 650 mg  650 mg Rectal Q6H PRN Nicoletta Dress, Na, MD      . allopurinol (ZYLOPRIM) tablet 100 mg  100 mg Oral Daily Nicoletta Dress, Na, MD   100 mg at 03/06/20 2009  . Ampicillin-Sulbactam (UNASYN) 3 g in sodium chloride 0.9 % 100 mL IVPB  3 g Intravenous Q6H Li, Na, MD 200 mL/hr at 03/07/20 0559 3 g at 03/07/20 0559  . atorvastatin (LIPITOR) tablet 10 mg  10 mg Oral Daily Nicoletta Dress, Na, MD   10 mg at 03/06/20 2009  . chlorhexidine (PERIDEX) 0.12 % solution 15 mL  15 mL Mouth Rinse BID Barb Merino, MD      . diltiazem (CARDIZEM CD) 24 hr capsule 240 mg  240 mg Oral Daily Nicoletta Dress, Na, MD   240 mg at 03/06/20 2009  . MEDLINE mouth rinse  15 mL Mouth Rinse q12n4p Barb Merino, MD      . metoprolol succinate (TOPROL-XL) 24 hr tablet 100 mg  100 mg Oral BID Nicoletta Dress, Na, MD   100 mg at 03/06/20 2009  . ondansetron (ZOFRAN) tablet 4 mg  4 mg Oral Q6H PRN Nicoletta Dress, Na, MD       Or  . ondansetron (ZOFRAN) injection 4 mg  4 mg Intravenous Q6H PRN Nicoletta Dress, Na, MD      . pantoprazole (PROTONIX) injection 40 mg  40 mg Intravenous Q12H Li, Na, MD   40 mg at 03/06/20 2100    Allergies as of 03/06/2020 - Review Complete 03/06/2020  Allergen Reaction Noted  . Coumadin [warfarin] Other (See Comments) 05/04/2014     Review of Systems:    Constitutional: No weight loss, fever or chills Skin: No rash Cardiovascular: No chest pain Respiratory: No SOB Gastrointestinal: See HPI and otherwise negative Genitourinary: No dysuria Neurological: No headache, dizziness or syncope Musculoskeletal: No new muscle or joint pain Hematologic: No bleeding  Psychiatric: No history of depression or anxiety    Physical Exam:  Vital signs in last 24 hours: Temp:  [97.7 F (36.5 C)-98.5 F (36.9 C)] 98.4 F (36.9 C) (02/07 0848) Pulse Rate:  [32-110] 81 (02/07 0848) Resp:  [16-23] 18 (02/07 0848) BP: (122-167)/(61-97) 138/84 (02/07 0539) SpO2:  [90 %-100 %] 93 % (02/07 0848) Weight:  [90.4 kg-91.6 kg] 90.4 kg (02/07 0539) Last BM Date: 03/06/20 General:   Pleasant Caucasian male appears to be in NAD, Well developed, Well nourished, alert and cooperative Head:  Normocephalic and atraumatic. Eyes:   PEERL, EOMI. No icterus. Conjunctiva pink. Ears:  Normal auditory acuity. Neck:  Supple Throat: Oral cavity and pharynx without inflammation, swelling or lesion. +NG tube in place to wall with LIS- 50cc coffee ground material in cannister Lungs: Respirations even and unlabored. Lungs clear to auscultation bilaterally.   No wheezes, crackles, or rhonchi.  Heart: Normal S1, S2. No MRG. Regular rate and rhythm. No peripheral edema, cyanosis or pallor.  Abdomen:  Soft, mild distension, nontender. No  rebound or guarding. Decreased BS all four quadrants, No appreciable masses or hepatomegaly. Rectal:  Not performed.  Msk:  Symmetrical without gross deformities. Peripheral pulses intact.  Extremities:  Without edema, no deformity or joint abnormality.  Neurologic:  Alert and  oriented x4;  grossly normal neurologically.  Skin:   Dry and intact without significant lesions or rashes. Psychiatric: Demonstrates good judgement and reason without abnormal affect or behaviors.   LAB RESULTS: Recent Labs    03/06/20 1023 03/06/20 1737 03/07/20 0023 03/07/20 0601  WBC 16.9*  --   --  10.3  HGB 19.0* 17.2* 15.6 17.0  HCT 56.8* 53.3* 48.2 50.2  PLT 265  --   --  193   BMET Recent Labs    03/06/20 1023 03/07/20 0601  NA 139 143  K 3.7 3.7  CL 99 107  CO2 27 24  GLUCOSE 144* 88  BUN 16 14  CREATININE 1.40* 1.41*  CALCIUM 9.7 8.9   LFT Recent Labs    03/07/20 0601  PROT 5.8*  ALBUMIN 3.1*  AST 18  ALT 9  ALKPHOS 64  BILITOT 2.4*   PT/INR Recent Labs    03/06/20 1023 03/06/20 1205  LABPROT 14.5 15.4*  INR 1.2 1.3*    STUDIES: DG Abdomen 1 View  Result Date: 03/06/2020 CLINICAL DATA:  NG tube placement. EXAM: ABDOMEN - 1 VIEW COMPARISON:  CT abdomen pelvis-earlier same day FINDINGS: Enteric tube side port projected the expected location the gastroesophageal junction. Moderate to marked gaseous distension of several loops of small bowel with index loop of small bowel in the left mid hemiabdomen measuring 4.8 cm in diameter. This finding is associated with a conspicuous paucity of distal colonic gas. No supine evidence of pneumoperitoneum. No pneumatosis or portal venous gas. Post cholecystectomy. Calcifications overlies expected location of the bilateral renal fossa compatible with known nephrolithiasis. No acute osseous abnormalities. IMPRESSION: 1. Enteric tube side port projects over the expected location of the gastroesophageal junction. Advancement approximately 10 cm  is. 2. Similar findings again worrisome for least partial small bowel obstruction. 3. Bilateral nephrolithiasis as was demonstrated on preceding abdominal CT. Electronically Signed   By: Sandi Mariscal M.D.   On: 03/06/2020 13:49   CT ABDOMEN PELVIS W CONTRAST  Result Date: 03/06/2020 CLINICAL DATA:  Generalized abdominal pain, bloating. EXAM: CT ABDOMEN AND PELVIS WITH CONTRAST TECHNIQUE: Multidetector CT imaging of the abdomen and pelvis was performed using the standard protocol following bolus administration of intravenous contrast. CONTRAST:  152mL OMNIPAQUE IOHEXOL 300 MG/ML  SOLN COMPARISON:  December 24, 2018. FINDINGS: Lower chest: Focal left lower lobe airspace opacity is noted concerning for pneumonia. Hepatobiliary: No focal liver abnormality is seen. Status post cholecystectomy. No biliary dilatation. Pancreas: Unremarkable. No pancreatic ductal dilatation or surrounding inflammatory changes. Spleen: Normal in size without focal abnormality. Adrenals/Urinary Tract: Adrenal glands appear normal. Bilateral nonobstructive nephrolithiasis is noted. Bilateral renal cortical scarring is noted. Bilateral renal cysts are noted. No hydronephrosis or renal obstruction is noted. Urinary bladder is unremarkable. Stomach/Bowel: Mildly distended and fluid-filled stomach is noted. Mild proximal small bowel dilatation is noted  without definite transition zone; is uncertain if this represents ileus or possibly partial small bowel obstruction. No colonic dilatation is noted. Sigmoid diverticulosis is noted without inflammation. The stomach appears normal. Vascular/Lymphatic: Aortic atherosclerosis. No enlarged abdominal or pelvic lymph nodes. Reproductive: Stable mild prostatic enlargement is noted. Other: No abdominal wall hernia or abnormality. No abdominopelvic ascites. Musculoskeletal: No acute or significant osseous findings. IMPRESSION: 1. Focal left lower lobe airspace opacity is noted concerning for pneumonia. 2.  Bilateral nonobstructive nephrolithiasis. No hydronephrosis or renal obstruction is noted. 3. Sigmoid diverticulosis without inflammation. 4. Stable mild prostatic enlargement. 5. Mild proximal small bowel dilatation is noted without definite transition zone; is uncertain if this represents ileus or possibly partial small bowel obstruction. 6. Aortic atherosclerosis. Aortic Atherosclerosis (ICD10-I70.0). Electronically Signed   By: Marijo Conception M.D.   On: 03/06/2020 11:46   DG Chest Portable 1 View  Result Date: 03/06/2020 CLINICAL DATA:  Advancement of nasogastric tube. EXAM: PORTABLE CHEST 1 VIEW COMPARISON:  Same day. FINDINGS: Distal tip of nasogastric tube is seen at the expected position of the gastroesophageal junction. This is not significantly changed compared to prior exam. IMPRESSION: No significant change compared to prior exam. Distal tip of nasogastric tube seen at the expected position of the gastroesophageal junction. Electronically Signed   By: Marijo Conception M.D.   On: 03/06/2020 14:47   DG Abd Portable 1V  Result Date: 03/07/2020 CLINICAL DATA:  Small bowel obstruction, vomiting, enteric tube EXAM: PORTABLE ABDOMEN - 1 VIEW COMPARISON:  03/06/2020 abdominal radiograph FINDINGS: Enteric tube terminates in proximal stomach. A few mildly dilated central and left small bowel loops, improved, for example 3.8 cm diameter, previously 4.8 cm. No evidence of pneumatosis or pneumoperitoneum. No radiopaque nephrolithiasis. Cholecystectomy clips are seen in the right upper quadrant of the abdomen. IMPRESSION: Enteric tube terminates in the proximal stomach. A few mildly dilated central and left small bowel loops, improved, compatible with improving small bowel obstruction. Electronically Signed   By: Ilona Sorrel M.D.   On: 03/07/2020 09:22    Impression / Plan:   Impression: 1.  Small bowel obstruction: Ileus versus small bowel obstruction, either way seems to be improving after NG tube with  intermittent suction over the past 24 hours, patient did have a bowel movement this morning and x-ray as above; unknown cause 2.  Coffee-ground emesis: Started on 03/05/2020 in the evening, NG tube with 50 cc of coffee-ground, hemoglobin stable; consider daily versus other 3.  A. fib on Eliquis: Last dose of Eliquis 03/05/2018 2 in the evening 4.  Left lower lobe aspiration pneumonitis versus pneumonia  Plan: 1.  Bowel obstruction seems to be improving over the past 24 hours, likely to proceed with EGD tomorrow.  I went ahead and schedule patient for EGD in the morning with Dr. Havery Moros.  Did discuss risk and benefits, limitations and alternatives and patient agrees to proceed. 2.  Continue supportive measures 3.  Continue monitor hemoglobin 4.  Please await further recommendations from Dr. Havery Moros later today.  Thank you for your kind consultation, we will continue to follow.  Lavone Nian [facility]  03/07/2020, 9:54 AM

## 2020-03-08 ENCOUNTER — Inpatient Hospital Stay (HOSPITAL_COMMUNITY): Payer: Medicare HMO | Admitting: Anesthesiology

## 2020-03-08 ENCOUNTER — Inpatient Hospital Stay (HOSPITAL_COMMUNITY): Payer: Medicare HMO

## 2020-03-08 ENCOUNTER — Encounter (HOSPITAL_COMMUNITY): Payer: Self-pay | Admitting: Internal Medicine

## 2020-03-08 ENCOUNTER — Encounter (HOSPITAL_COMMUNITY)
Admission: EM | Disposition: A | Discharge status: Home / Self Care | Payer: Self-pay | Source: Home / Self Care | Attending: Internal Medicine

## 2020-03-08 DIAGNOSIS — K3189 Other diseases of stomach and duodenum: Secondary | ICD-10-CM

## 2020-03-08 HISTORY — PX: BIOPSY: SHX5522

## 2020-03-08 HISTORY — PX: ESOPHAGOGASTRODUODENOSCOPY (EGD) WITH PROPOFOL: SHX5813

## 2020-03-08 LAB — CBC
HCT: 47.3 % (ref 39.0–52.0)
Hemoglobin: 15.3 g/dL (ref 13.0–17.0)
MCH: 29.1 pg (ref 26.0–34.0)
MCHC: 32.3 g/dL (ref 30.0–36.0)
MCV: 89.9 fL (ref 80.0–100.0)
Platelets: 183 10*3/uL (ref 150–400)
RBC: 5.26 MIL/uL (ref 4.22–5.81)
RDW: 13.7 % (ref 11.5–15.5)
WBC: 9 10*3/uL (ref 4.0–10.5)
nRBC: 0 % (ref 0.0–0.2)

## 2020-03-08 LAB — PHOSPHORUS: Phosphorus: 2.8 mg/dL (ref 2.5–4.6)

## 2020-03-08 LAB — BASIC METABOLIC PANEL
Anion gap: 13 (ref 5–15)
BUN: 15 mg/dL (ref 8–23)
CO2: 20 mmol/L — ABNORMAL LOW (ref 22–32)
Calcium: 8.5 mg/dL — ABNORMAL LOW (ref 8.9–10.3)
Chloride: 108 mmol/L (ref 98–111)
Creatinine, Ser: 1.21 mg/dL (ref 0.61–1.24)
GFR, Estimated: >60 mL/min (ref >60)
Glucose, Bld: 86 mg/dL (ref 70–99)
Potassium: 3.6 mmol/L (ref 3.5–5.1)
Sodium: 141 mmol/L (ref 135–145)

## 2020-03-08 LAB — GLUCOSE, CAPILLARY
Glucose-Capillary: 104 mg/dL — ABNORMAL HIGH (ref 70–99)
Glucose-Capillary: 117 mg/dL — ABNORMAL HIGH (ref 70–99)
Glucose-Capillary: 140 mg/dL — ABNORMAL HIGH (ref 70–99)
Glucose-Capillary: 62 mg/dL — ABNORMAL LOW (ref 70–99)
Glucose-Capillary: 80 mg/dL (ref 70–99)
Glucose-Capillary: 83 mg/dL (ref 70–99)
Glucose-Capillary: 87 mg/dL (ref 70–99)

## 2020-03-08 LAB — MAGNESIUM: Magnesium: 2 mg/dL (ref 1.7–2.4)

## 2020-03-08 SURGERY — ESOPHAGOGASTRODUODENOSCOPY (EGD) WITH PROPOFOL
Anesthesia: Monitor Anesthesia Care

## 2020-03-08 MED ORDER — PROPOFOL 500 MG/50ML IV EMUL
INTRAVENOUS | Status: DC | PRN
  Administered 2020-03-08: 200 ug/kg/min via INTRAVENOUS

## 2020-03-08 MED ORDER — DEXTROSE 50 % IV SOLN
1.0000 | Freq: Once | INTRAVENOUS | Status: AC
  Administered 2020-03-08: 50 mL via INTRAVENOUS

## 2020-03-08 MED ORDER — LIDOCAINE 2% (20 MG/ML) 5 ML SYRINGE
INTRAMUSCULAR | Status: DC | PRN
Start: 2020-03-08 — End: 2020-03-08
  Administered 2020-03-08: 60 mg via INTRAVENOUS

## 2020-03-08 MED ORDER — SODIUM CHLORIDE 0.9 % IV SOLN: INTRAVENOUS | Status: DC | PRN

## 2020-03-08 MED ORDER — PROPOFOL 10 MG/ML IV BOLUS
INTRAVENOUS | Status: DC | PRN
  Administered 2020-03-08: 50 mg via INTRAVENOUS

## 2020-03-08 SURGICAL SUPPLY — 15 items

## 2020-03-08 NOTE — Anesthesia Procedure Notes (Signed)
Procedure Name: MAC Date/Time: 03/08/2020 9:33 AM Performed by: Renato Shin, CRNA Pre-anesthesia Checklist: Patient identified, Emergency Drugs available, Suction available and Patient being monitored Patient Re-evaluated:Patient Re-evaluated prior to induction Oxygen Delivery Method: Nasal cannula Preoxygenation: Pre-oxygenation with 100% oxygen Induction Type: IV induction Placement Confirmation: positive ETCO2 and breath sounds checked- equal and bilateral Dental Injury: Teeth and Oropharynx as per pre-operative assessment

## 2020-03-08 NOTE — Transfer of Care (Signed)
Immediate Anesthesia Transfer of Care Note  Patient: [name] Woods  Procedure(s) Performed: ESOPHAGOGASTRODUODENOSCOPY (EGD) WITH PROPOFOL (N/A ) BIOPSY  Patient Location: PACU  Anesthesia Type:General  Level of Consciousness: awake and patient cooperative  Airway & Oxygen Therapy: Patient Spontanous Breathing and Patient connected to nasal cannula oxygen  Post-op Assessment: Report given to RN and Post -op Vital signs reviewed and stable  Post vital signs: Reviewed and stable  Last Vitals:  Vitals Value Taken Time  BP 183/68 03/08/20 1012  Temp 36.5 C 03/08/20 0947  Pulse 81 03/08/20 1010  Resp 22 03/08/20 1010  SpO2 99 % 03/08/20 1010  Vitals shown include unvalidated device data.  Last Pain:  Vitals:   03/08/20 1000  TempSrc:   PainSc: 0-No pain      Patients Stated Pain Goal: 0 (01/58/68 2574)  Complications: No complications documented.

## 2020-03-08 NOTE — Progress Notes (Signed)
[facility] Progress Note     Subjective: CC-  Passing a lot of flatus and had several loose stools over night. Abdominal pain/ distension improved. Main complaint is the NG tube, states that his throat is sore and the tube has started making him dry heave. Xray today shows gast and contrast in colon. Going for EGD later this morning.  Objective: Vital signs in last 24 hours: Temp:  [97.9 F (36.6 C)-98.6 F (37 C)] 98.6 F (37 C) (02/08 0402) Pulse Rate:  [73-92] 81 (02/08 0402) Resp:  [18-20] 18 (02/08 0402) BP: (143-168)/(72-84) 158/79 (02/08 0402) SpO2:  [90 %-95 %] 94 % (02/08 0402) Weight:  [90.3 kg] 90.3 kg (02/08 0415) Last BM Date: 03/07/20  Intake/Output from previous day: 02/07 0701 - 02/08 0700 In: 1433.3 [I.V.:1000; IV Piggyback:433.3] Out: 625 [Urine:300; Emesis/NG output:325] Intake/Output this shift: No intake/output data recorded.  PE: Gen:  Alert, NAD, pleasant Abd: Soft, mild distension, NT, +BS Psych: A&Ox3  Skin: no rashes noted, warm and dry  Lab Results:  Recent Labs    03/07/20 0601 03/07/20 1148 03/07/20 1746 03/08/20 0110  WBC 10.3  --   --  9.0  HGB 17.0   < > 16.1 15.3  HCT 50.2   < > 49.2 47.3  PLT 193  --   --  183   < > = values in this interval not displayed.   BMET Recent Labs    03/07/20 0601 03/08/20 0110  NA 143 141  K 3.7 3.6  CL 107 108  CO2 24 20*  GLUCOSE 88 86  BUN 14 15  CREATININE 1.41* 1.21  CALCIUM 8.9 8.5*   PT/INR Recent Labs    03/06/20 1023 03/06/20 1205  LABPROT 14.5 15.4*  INR 1.2 1.3*   CMP     Component Value Date/Time   NA 141 03/08/2020 0110   NA 142 02/18/2020 0953   K 3.6 03/08/2020 0110   CL 108 03/08/2020 0110   CO2 20 (L) 03/08/2020 0110   GLUCOSE 86 03/08/2020 0110   BUN 15 03/08/2020 0110   BUN 19 02/18/2020 0953   CREATININE 1.21 03/08/2020 0110   CREATININE 1.77 (H) 12/24/2018 0949   CALCIUM 8.5 (L) 03/08/2020 0110   PROT 5.8 (L) 03/07/2020 0601   PROT 6.6  02/18/2020 0953   ALBUMIN 3.1 (L) 03/07/2020 0601   ALBUMIN 4.2 02/18/2020 0953   AST 18 03/07/2020 0601   ALT 9 03/07/2020 0601   ALKPHOS 64 03/07/2020 0601   BILITOT 2.4 (H) 03/07/2020 0601   BILITOT 2.4 (H) 02/18/2020 0953   GFRNONAA >60 03/08/2020 0110   GFRAA 57 (L) 02/18/2020 0953   Lipase     Component Value Date/Time   LIPASE 34 06/22/2008 0334       Studies/Results: DG Abdomen 1 View  Result Date: 03/06/2020 CLINICAL DATA:  NG tube placement. EXAM: ABDOMEN - 1 VIEW COMPARISON:  CT abdomen pelvis-earlier same day FINDINGS: Enteric tube side port projected the expected location the gastroesophageal junction. Moderate to marked gaseous distension of several loops of small bowel with index loop of small bowel in the left mid hemiabdomen measuring 4.8 cm in diameter. This finding is associated with a conspicuous paucity of distal colonic gas. No supine evidence of pneumoperitoneum. No pneumatosis or portal venous gas. Post cholecystectomy. Calcifications overlies expected location of the bilateral renal fossa compatible with known nephrolithiasis. No acute osseous abnormalities. IMPRESSION: 1. Enteric tube side port projects over the expected location of the gastroesophageal  junction. Advancement approximately 10 cm is. 2. Similar findings again worrisome for least partial small bowel obstruction. 3. Bilateral nephrolithiasis as was demonstrated on preceding abdominal CT. Electronically Signed   By: Sandi Mariscal M.D.   On: 03/06/2020 13:49   CT ABDOMEN PELVIS W CONTRAST  Result Date: 03/06/2020 CLINICAL DATA:  Generalized abdominal pain, bloating. EXAM: CT ABDOMEN AND PELVIS WITH CONTRAST TECHNIQUE: Multidetector CT imaging of the abdomen and pelvis was performed using the standard protocol following bolus administration of intravenous contrast. CONTRAST:  181mL OMNIPAQUE IOHEXOL 300 MG/ML  SOLN COMPARISON:  December 24, 2018. FINDINGS: Lower chest: Focal left lower lobe airspace opacity  is noted concerning for pneumonia. Hepatobiliary: No focal liver abnormality is seen. Status post cholecystectomy. No biliary dilatation. Pancreas: Unremarkable. No pancreatic ductal dilatation or surrounding inflammatory changes. Spleen: Normal in size without focal abnormality. Adrenals/Urinary Tract: Adrenal glands appear normal. Bilateral nonobstructive nephrolithiasis is noted. Bilateral renal cortical scarring is noted. Bilateral renal cysts are noted. No hydronephrosis or renal obstruction is noted. Urinary bladder is unremarkable. Stomach/Bowel: Mildly distended and fluid-filled stomach is noted. Mild proximal small bowel dilatation is noted without definite transition zone; is uncertain if this represents ileus or possibly partial small bowel obstruction. No colonic dilatation is noted. Sigmoid diverticulosis is noted without inflammation. The stomach appears normal. Vascular/Lymphatic: Aortic atherosclerosis. No enlarged abdominal or pelvic lymph nodes. Reproductive: Stable mild prostatic enlargement is noted. Other: No abdominal wall hernia or abnormality. No abdominopelvic ascites. Musculoskeletal: No acute or significant osseous findings. IMPRESSION: 1. Focal left lower lobe airspace opacity is noted concerning for pneumonia. 2. Bilateral nonobstructive nephrolithiasis. No hydronephrosis or renal obstruction is noted. 3. Sigmoid diverticulosis without inflammation. 4. Stable mild prostatic enlargement. 5. Mild proximal small bowel dilatation is noted without definite transition zone; is uncertain if this represents ileus or possibly partial small bowel obstruction. 6. Aortic atherosclerosis. Aortic Atherosclerosis (ICD10-I70.0). Electronically Signed   By: Marijo Conception M.D.   On: 03/06/2020 11:46   DG Chest Portable 1 View  Result Date: 03/06/2020 CLINICAL DATA:  Advancement of nasogastric tube. EXAM: PORTABLE CHEST 1 VIEW COMPARISON:  Same day. FINDINGS: Distal tip of nasogastric tube is seen at  the expected position of the gastroesophageal junction. This is not significantly changed compared to prior exam. IMPRESSION: No significant change compared to prior exam. Distal tip of nasogastric tube seen at the expected position of the gastroesophageal junction. Electronically Signed   By: Marijo Conception M.D.   On: 03/06/2020 14:47   DG Abd Portable 1V-Small Bowel Obstruction Protocol-initial, 8 hr delay  Result Date: 03/08/2020 CLINICAL DATA:  Small bowel obstruction EXAM: PORTABLE ABDOMEN - 1 VIEW COMPARISON:  03/07/2020.  CT 03/06/2020. FINDINGS: Dilated right abdominal small bowel loops compatible with small bowel obstruction. Some improvement since prior CT. Gas and contrast material seen within the colon. NG tube tip is at the GE junction with the side port in the distal esophagus. IMPRESSION: Continued small bowel obstruction pattern with some improvement since prior CT. NG tube tip at the GE junction. This could be advanced several cm for optimal positioning. Electronically Signed   By: Rolm Baptise M.D.   On: 03/08/2020 00:41   DG Abd Portable 1V  Result Date: 03/07/2020 CLINICAL DATA:  Small bowel obstruction, vomiting, enteric tube EXAM: PORTABLE ABDOMEN - 1 VIEW COMPARISON:  03/06/2020 abdominal radiograph FINDINGS: Enteric tube terminates in proximal stomach. A few mildly dilated central and left small bowel loops, improved, for example 3.8 cm diameter, previously 4.8  cm. No evidence of pneumatosis or pneumoperitoneum. No radiopaque nephrolithiasis. Cholecystectomy clips are seen in the right upper quadrant of the abdomen. IMPRESSION: Enteric tube terminates in the proximal stomach. A few mildly dilated central and left small bowel loops, improved, compatible with improving small bowel obstruction. Electronically Signed   By: Ilona Sorrel M.D.   On: 03/07/2020 09:22    Anti-infectives: Anti-infectives (From admission, onward)   Start     Dose/Rate Route Frequency Ordered Stop   03/06/20  1800  Ampicillin-Sulbactam (UNASYN) 3 g in sodium chloride 0.9 % 100 mL IVPB        3 g 200 mL/hr over 30 Minutes Intravenous Every 6 hours 03/06/20 1724     03/06/20 1200  Ampicillin-Sulbactam (UNASYN) 3 g in sodium chloride 0.9 % 100 mL IVPB        3 g 200 mL/hr over 30 Minutes Intravenous  Once 03/06/20 1159 03/06/20 1305       Assessment/Plan CAD A. Fib on Eliquis OSA HTN HLD LLL PNA - On Unasyn per [facility] Diverticulosis - No diverticulitis on CT  Melena & Coffee ground emesis - hgb 15.3. Fecal occult negative. Going for EGD with GI today  SBO vs pSBO - Hx of Lap Chole. No prior hx of SBO's - Started SBP 2/7 and delayed film shows contrast in colon - passing flatus and having loose BMs - D/c NG tube and advance to [facility]D (after EGD).  FEN - IVF per [facility], ok for CLD from surgical standpoint VTE - SCDs, anticoagulation and prophylaxis on hold for reported melena and coffee ground emesis ID - Unasyn per [facility] for PNA   LOS: 2 days    [place]ellington [place], [facility] 03/08/2020, 8:20 AM Please see Amion for pager number during day hours 7:00am-4:30pm

## 2020-03-08 NOTE — Interval H&P Note (Signed)
History and Physical Interval Note: PAtient feeling much better. Passing gas and stool. Abdomen soft. Xray shows improvement but still some dilated small bowel. WBC and Hgb normal. Stable for EGD this AM.   03/08/2020 9:24 AM  [name] Woods  has presented today for surgery, with the diagnosis of Coffee ground emesis.  The various methods of treatment have been discussed with the patient and family. After consideration of risks, benefits and other options for treatment, the patient has consented to  Procedure(s): ESOPHAGOGASTRODUODENOSCOPY (EGD) WITH PROPOFOL (N/A) as a surgical intervention.  The patient's history has been reviewed, patient examined, no change in status, stable for surgery.  I have reviewed the patient's chart and labs.  Questions were answered to the patient's satisfaction.     [place]

## 2020-03-08 NOTE — Progress Notes (Signed)
  Hypoglycemic Event  CBG: 62  Treatment: iv 1 D50 Ampule   Symptoms: none  Follow-up CBG: Time:03/08/2020 0435 CBG Result:104  Possible Reasons for Event: NPO Comments/MD notified:    [employer]

## 2020-03-08 NOTE — Op Note (Signed)
[facility] Patient Name: [name] Woods Procedure Date : 03/08/2020 MRN: [id number] Attending MD: Carlota Raspberry. Lexia Vandevender , MD Date of Birth: 10-27-1940 CSN: [id number] Age: 80 Admit Type: Outpatient Procedure:                Upper GI endoscopy Indications:              Hematemesis (reported coffee ground emesis), recent                            small bowel obstruction which is improving - Hgb                            has been normal, on Eliquis, clear upper GI tract                            prior to resumption Providers:                Carlota Raspberry. Havery Moros, MD, Josie Dixon, RN,                            Janee Morn, Technician Referring MD:              Medicines:                Monitored Anesthesia Care Complications:            No immediate complications. Estimated blood loss:                            Minimal. Estimated Blood Loss:     Estimated blood loss was minimal. Procedure:                Pre-Anesthesia Assessment:                           - Prior to the procedure, a History and Physical                            was performed, and patient medications and                            allergies were reviewed. The patient's tolerance of                            previous anesthesia was also reviewed. The risks                            and benefits of the procedure and the sedation                            options and risks were discussed with the patient.                            All questions were answered, and informed consent  was obtained. Prior Anticoagulants: The patient has                            taken Eliquis (apixaban), last dose was 3 days                            prior to procedure. ASA Grade Assessment: III - A                            patient with severe systemic disease. After                            reviewing the risks and benefits, the patient was                            deemed in  satisfactory condition to undergo the                            procedure.                           After obtaining informed consent, the endoscope was                            passed under direct vision. Throughout the                            procedure, the patient's blood pressure, pulse, and                            oxygen saturations were monitored continuously. The                            [id number]-H190 ([id number] Olympus gastroscope was                            introduced through the mouth, and advanced to the                            second part of duodenum. The upper GI endoscopy was                            accomplished without difficulty. The patient                            tolerated the procedure well. Scope In: Scope Out: Findings:      Esophagogastric landmarks were identified: the Z-line was found at 45       cm, the gastroesophageal junction was found at 45 cm and the upper       extent of the gastric folds was found at 45 cm from the incisors.      The exam of the esophagus was otherwise normal. There were some red       spots from NG tube trauma which were appreciated.  A single subepithelial nodule was found in the gastric antrum, roughly       1cm in size. Bite on bite biopsies were taken with a cold forceps for       histology.      A few small sessile polyps were found in the gastric fundus, suspect       benign fundic gland. One representative polyp was removed with a cold       biopsy forceps. Resection and retrieval were complete.      There were multiple red spots / irritation from NG tube trauma in the       proximal stomach. The exam of the stomach was otherwise normal.      A large diverticulum was found in the second portion of the duodenum.      The exam of the duodenum was otherwise normal. Impression:               - Esophagogastric landmarks identified.                           - Normal esophagus otherwise aside of NG tube                             trauma - mild                           - A single subepithelial nodule was found in the                            stomach, benign appearing. Biopsied.                           - A few gastric polyps. Resected and retrieved.                           - Normal stomach otherwise aside from NG tube                            trauma - mild                           - Duodenal diverticulum.                           - Normal duodenum otherwise                           No cause for any bleeding symptoms in the upper                            tract. Could have had a small Mallory weiss tear in                            the setting of obstruction which healed on its own.                            Otherwise no stigmata of bleeding in the upper  tract, Hgb normal. Recommendation:           - Return patient to hospital ward for ongoing care.                           - Bowel obstruction appears to be improved /                            resolving, clear liquid diet if okay with surgical                            team                           - Continue present medications.                           - Await pathology results.                           - Can resume Eliquis at discretion of primary team,                            assuming patient is not needing surgical                            intervention at this point                           - GI service will sign off for now, please call                            with questions moving forward Procedure Code(s):        --- Professional ---                           [phone], Esophagogastroduodenoscopy, flexible,                            transoral; with biopsy, single or multiple Diagnosis Code(s):        --- Professional ---                           K31.89, Other diseases of stomach and duodenum                           K31.7, Polyp of stomach and duodenum                           K92.0,  Hematemesis                           K57.10, Diverticulosis of small intestine without                            perforation or abscess without bleeding CPT copyright 2019 American Medical Association. All rights reserved. The  codes documented in this report are preliminary and upon coder review may  be revised to meet current compliance requirements. Remo Lipps P. Havery Moros, MD 03/08/2020 9:54:18 AM This report has been signed electronically. Number of Addenda: 0

## 2020-03-08 NOTE — Anesthesia Preprocedure Evaluation (Addendum)
Anesthesia Evaluation  Patient identified by MRN, date of birth, ID band Patient awake    Reviewed: Allergy & Precautions, NPO status , Patient's Chart, lab work & pertinent test results  History of Anesthesia Complications Negative for: history of anesthetic complications  Airway Mallampati: II  TM Distance: >3 FB Neck ROM: Full    Dental   Pulmonary sleep apnea and Continuous Positive Airway Pressure Ventilation , COPD,    Pulmonary exam normal        Cardiovascular hypertension, + CAD  Normal cardiovascular exam+ dysrhythmias Atrial Fibrillation      Neuro/Psych Anxiety Depression negative neurological ROS     GI/Hepatic Neg liver ROS, hiatal hernia, GERD  ,  Endo/Other  negative endocrine ROS  Renal/GU Renal disease  negative genitourinary   Musculoskeletal  (+) Arthritis  (ankylosing spodylitis),   Abdominal   Peds  Hematology negative hematology ROS (+)   Anesthesia Other Findings   Reproductive/Obstetrics                           Anesthesia Physical Anesthesia Plan  ASA: III  Anesthesia Plan: MAC   Post-op Pain Management:    Induction: Intravenous  PONV Risk Score and Plan: 1 and Propofol infusion, TIVA and Treatment may vary due to age or medical condition  Airway Management Planned: Natural Airway, Nasal Cannula and Simple Face Mask  Additional Equipment: None  Intra-op Plan:   Post-operative Plan:   Informed Consent: I have reviewed the patients History and Physical, chart, labs and discussed the procedure including the risks, benefits and alternatives for the proposed anesthesia with the patient or authorized representative who has indicated his/her understanding and acceptance.       Plan Discussed with:   Anesthesia Plan Comments:         Anesthesia Quick Evaluation

## 2020-03-08 NOTE — Anesthesia Postprocedure Evaluation (Signed)
Anesthesia Post Note  Patient: [name] Woods  Procedure(s) Performed: ESOPHAGOGASTRODUODENOSCOPY (EGD) WITH PROPOFOL (N/A ) BIOPSY     Patient location during evaluation: Endoscopy Anesthesia Type: MAC Level of consciousness: awake and alert Pain management: pain level controlled Vital Signs Assessment: post-procedure vital signs reviewed and stable Respiratory status: spontaneous breathing, nonlabored ventilation and respiratory function stable Cardiovascular status: blood pressure returned to baseline and stable Postop Assessment: no apparent nausea or vomiting Anesthetic complications: no   No complications documented.  Last Vitals:  Vitals:   03/08/20 1010 03/08/20 1026  BP: (!) 183/68 (!) 148/67  Pulse: 76 83  Resp: (!) 21 20  Temp:  36.9 C  SpO2: 96% 95%    Last Pain:  Vitals:   03/08/20 1026  TempSrc: Oral  PainSc: 0-No pain                 Lidia Collum

## 2020-03-08 NOTE — Progress Notes (Signed)
Pt unable to wear cpap d/t NG tube.  Pt does not wear cpap at home.  RT will continue to monitor.

## 2020-03-08 NOTE — Interval H&P Note (Deleted)
History and Physical Interval Note:  03/08/2020 9:23 AM  [name] Woods  has presented today for surgery, with the diagnosis of Coffee ground emesis.  The various methods of treatment have been discussed with the patient and family. After consideration of risks, benefits and other options for treatment, the patient has consented to  Procedure(s): ESOPHAGOGASTRODUODENOSCOPY (EGD) WITH PROPOFOL (N/A) as a surgical intervention.  The patient's history has been reviewed, patient examined, no change in status, stable for surgery.  I have reviewed the patient's chart and labs.  Questions were answered to the patient's satisfaction.     [place]

## 2020-03-08 NOTE — Progress Notes (Signed)
PROGRESS NOTE    [name] Woods  QMG:[id number] DOB: Jan 31, 1940 DOA: 03/06/2020 PCP: Delorse Limber    Brief Narrative:  80 year old male with history of coronary artery disease, A. fib on Eliquis, hypertension, hyperlipidemia, obstructive sleep apnea and diverticulosis presented to the hospital with abdominal cramping, dark melanotic stool and frequent vomiting associated with coffee-ground emesis for about 2 days. In the emergency room afebrile.  Hemodynamically stable.  On room air.  WBC 16.9.  Hemoglobin 19.  Negative FOBT.  EKG with A. fib.  CT scan abdomen showed ileus, left lower lobe airspace disease.  Patient was given IV Protonix, Unasyn, NG tube and admit to the hospital with surgical and GI consultation.   Assessment & Plan:   Principal Problem:   GI bleed Active Problems:   Essential hypertension   NEPHROLITHIASIS, HX OF   CAD (coronary artery disease)   GERD (gastroesophageal reflux disease)   Chronic anticoagulation   Chronic atrial fibrillation (HCC)   Aspiration pneumonia (HCC)   Ileus (HCC)   Partial small bowel obstruction (HCC)   OSA (obstructive sleep apnea)   Gastric nodule  Partial small bowel obstruction due to adhesions: History of cholecystectomy Clinically improving Followed by surgery.  NG tube out.  Now with adequate bowel function.  Started on clears, will gradually advance as tolerated.  Electrolytes are adequate.  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  Treated with Protonix 40 mg IV twice daily.  Last dose of Eliquis on 2/5. Underwent upper GI endoscopy today, did not find any evidence of bleeding.  Suspect it might have suffered a small Mallory-Weiss tear.  Hemoglobin is stable.  Recheck tomorrow morning. Will resume Eliquis tomorrow.  Left lower lobe airspace disease: Aspiration pneumonitis versus pneumonia.  Currently asymptomatic.  On Unasyn.   Blood cultures negative so far.  Will complete total 5 days of therapy, changed to oral antibiotics on discharge tomorrow.  Chronic A. fib: Rate controlled on Eliquis.  Eliquis on hold.  Cardizem able to control his rate.  Other medical problems including hypertension hyperlipidemia: Stable.  Will resume medications once patient is able to go home.  Mobilize.  Advanced diet gradually.  Anticipate discharge home tomorrow.  DVT prophylaxis: SCDs Start: 03/06/20 1844   Code Status: Full code Family Communication: wife at the bedside  Disposition Plan: Status is: Inpatient  Remains inpatient appropriate because:Inpatient level of care appropriate due to severity of illness   Dispo: The patient is from: Home              Anticipated d/c is to: Home              Anticipated d/c date is: Architectural technologist.              Patient currently is not medically stable to d/c.   Difficult to place patient No         Consultants:   General surgery  Gastroenterology  Procedures:   None  Antimicrobials:   Unasyn 2/6>>>   Subjective: Seen and examined . Came back from endo. Took clears and has no problem. Wife at the bedside. Wondering whether can go home.  Objective: Vitals:   03/08/20 0947 03/08/20 1000 03/08/20 1010 03/08/20 1026  BP: 130/81 (!) 168/67 (!) 183/68 (!) 148/67  Pulse: 78 80 76 83  Resp: 18 (!) 23 (!) 21 20  Temp: 97.7 F (36.5 C)   98.5 F (36.9 C)  TempSrc: Temporal  Oral  SpO2: 96% 96% 96% 95%  Weight:      Height:        Intake/Output Summary (Last 24 hours) at 03/08/2020 1251 Last data filed at 03/08/2020 0942 Gross per 24 hour  Intake 1633.3 ml  Output 325 ml  Net 1308.3 ml   Filed Weights   03/07/20 0539 03/08/20 0415 03/08/20 0824  Weight: 90.4 kg 90.3 kg 90.3 kg    Examination:  Physical Exam Constitutional:      Appearance: Normal appearance.  HENT:     Head: Normocephalic.  Cardiovascular:     Rate and Rhythm: Normal rate. Rhythm  irregular.     Heart sounds: Normal heart sounds.  Pulmonary:     Effort: Pulmonary effort is normal.  Abdominal:     General: Abdomen is flat. Bowel sounds are normal.     Palpations: Abdomen is soft.     Tenderness: There is no abdominal tenderness. There is no guarding.  Neurological:     General: No focal deficit present.     Mental Status: He is alert and oriented to person, place, and time.  Psychiatric:        Mood and Affect: Mood normal.        Behavior: Behavior normal.       Data Reviewed: I have personally reviewed following labs and imaging studies  CBC: Recent Labs  Lab 03/06/20 1023 03/06/20 1737 03/07/20 0023 03/07/20 0601 03/07/20 1148 03/07/20 1746 03/08/20 0110  WBC 16.9*  --   --  10.3  --   --  9.0  NEUTROABS 13.8*  --   --  6.5  --   --   --   HGB 19.0*   < > 15.6 17.0 16.9 16.1 15.3  HCT 56.8*   < > 48.2 50.2 50.1 49.2 47.3  MCV 88.5  --   --  89.5  --   --  89.9  PLT 265  --   --  193  --   --  183   < > = values in this interval not displayed.   Basic Metabolic Panel: Recent Labs  Lab 03/06/20 1023 03/07/20 0601 03/08/20 0110  NA 139 143 141  K 3.7 3.7 3.6  CL 99 107 108  CO2 27 24 20*  GLUCOSE 144* 88 86  BUN 16 14 15   CREATININE 1.40* 1.41* 1.21  CALCIUM 9.7 8.9 8.5*  MG  --  1.8 2.0  PHOS  --   --  2.8   GFR: Estimated Creatinine Clearance: 54.3 mL/min (by C-G formula based on SCr of 1.21 mg/dL). Liver Function Tests: Recent Labs  Lab 03/06/20 1023 03/07/20 0601  AST 26 18  ALT 16 9  ALKPHOS 85 64  BILITOT 2.5* 2.4*  PROT 7.7 5.8*  ALBUMIN 4.2 3.1*   No results for input(s): LIPASE, AMYLASE in the last 168 hours. No results for input(s): AMMONIA in the last 168 hours. Coagulation Profile: Recent Labs  Lab 03/06/20 1023 03/06/20 1205  INR 1.2 1.3*   Cardiac Enzymes: No results for input(s): CKTOTAL, CKMB, CKMBINDEX, TROPONINI in the last 168 hours. BNP (last 3 results) No results for input(s): PROBNP in the  last 8760 hours. HbA1C: Recent Labs    03/07/20 0023  HGBA1C 5.9*   CBG: Recent Labs  Lab 03/08/20 0032 03/08/20 0407 03/08/20 0506 03/08/20 0734 03/08/20 1119  GLUCAP 83 62* 104* 80 117*   Lipid Profile: No results for input(s): CHOL, HDL, LDLCALC, TRIG, CHOLHDL, LDLDIRECT  in the last 72 hours. Thyroid Function Tests: Recent Labs    03/06/20 1737  TSH 2.291   Anemia Panel: No results for input(s): VITAMINB12, FOLATE, FERRITIN, TIBC, IRON, RETICCTPCT in the last 72 hours. Sepsis Labs: Recent Labs  Lab 03/06/20 1802  LATICACIDVEN 1.4    Recent Results (from the past 240 hour(s))  SARS Coronavirus 2 by RT PCR (hospital order, performed in [facility] hospital lab) Nasopharyngeal Nasopharyngeal Swab     Status: None   Collection Time: 03/06/20 10:56 AM   Specimen: Nasopharyngeal Swab  Result Value Ref Range Status   SARS Coronavirus 2 NEGATIVE NEGATIVE Final    Comment: (NOTE) SARS-CoV-2 target nucleic acids are NOT DETECTED.  The SARS-CoV-2 RNA is generally detectable in upper and lower respiratory specimens during the acute phase of infection. The lowest concentration of SARS-CoV-2 viral copies this assay can detect is 250 copies / mL. A negative result does not preclude SARS-CoV-2 infection and should not be used as the sole basis for treatment or other patient management decisions.  A negative result may occur with improper specimen collection / handling, submission of specimen other than nasopharyngeal swab, presence of viral mutation(s) within the areas targeted by this assay, and inadequate number of viral copies (<250 copies / mL). A negative result must be combined with clinical observations, patient history, and epidemiological information.  Fact Sheet for Patients:   StrictlyIdeas.no  Fact Sheet for Healthcare Providers: BankingDealers.co.za  This test is not yet approved or  cleared by the [place] FDA and has been authorized for detection and/or diagnosis of SARS-CoV-2 by FDA under an Emergency Use Authorization (EUA).  This EUA will remain in effect (meaning this test can be used) for the duration of the COVID-19 declaration under Section 564(b)(1) of the Act, 21 U.S.C. section 360bbb-3(b)(1), unless the authorization is terminated or revoked sooner.  Performed at [facility], [address] [place], [place] [address]   Culture, blood (Routine X 2) w Reflex to ID Panel     Status: None (Preliminary result)   Collection Time: 03/06/20  6:17 PM   Specimen: BLOOD  Result Value Ref Range Status   Specimen Description BLOOD RIGHT ANTECUBITAL  Final   Special Requests   Final    BOTTLES DRAWN AEROBIC ONLY Blood Culture adequate volume   Culture   Final    NO GROWTH 2 DAYS Performed at [facility], [address], [place], [place] [address]    Report Status PENDING  Incomplete  Culture, blood (Routine X 2) w Reflex to ID Panel     Status: None (Preliminary result)   Collection Time: 03/06/20  6:17 PM   Specimen: BLOOD RIGHT HAND  Result Value Ref Range Status   Specimen Description BLOOD RIGHT HAND  Final   Special Requests   Final    BOTTLES DRAWN AEROBIC ONLY Blood Culture adequate volume   Culture   Final    NO GROWTH 2 DAYS Performed at [facility], [place] [address], [place], [place] [address]    Report Status PENDING  Incomplete         Radiology Studies: DG Abdomen 1 View  Result Date: 03/06/2020 CLINICAL DATA:  NG tube placement. EXAM: ABDOMEN - 1 VIEW COMPARISON:  CT abdomen pelvis-earlier same day FINDINGS: Enteric tube side port projected the expected location the gastroesophageal junction. Moderate to marked gaseous distension of several loops of small bowel with index loop of small bowel in  the left mid hemiabdomen measuring 4.8 cm in diameter. This finding is associated with a conspicuous paucity of distal colonic gas. No supine  evidence of pneumoperitoneum. No pneumatosis or portal venous gas. Post cholecystectomy. Calcifications overlies expected location of the bilateral renal fossa compatible with known nephrolithiasis. No acute osseous abnormalities. IMPRESSION: 1. Enteric tube side port projects over the expected location of the gastroesophageal junction. Advancement approximately 10 cm is. 2. Similar findings again worrisome for least partial small bowel obstruction. 3. Bilateral nephrolithiasis as was demonstrated on preceding abdominal CT. Electronically Signed   By: Sandi Mariscal M.D.   On: 03/06/2020 13:49   DG Chest Portable 1 View  Result Date: 03/06/2020 CLINICAL DATA:  Advancement of nasogastric tube. EXAM: PORTABLE CHEST 1 VIEW COMPARISON:  Same day. FINDINGS: Distal tip of nasogastric tube is seen at the expected position of the gastroesophageal junction. This is not significantly changed compared to prior exam. IMPRESSION: No significant change compared to prior exam. Distal tip of nasogastric tube seen at the expected position of the gastroesophageal junction. Electronically Signed   By: Marijo Conception M.D.   On: 03/06/2020 14:47   DG Abd Portable 1V-Small Bowel Obstruction Protocol-initial, 8 hr delay  Result Date: 03/08/2020 CLINICAL DATA:  Small bowel obstruction EXAM: PORTABLE ABDOMEN - 1 VIEW COMPARISON:  03/07/2020.  CT 03/06/2020. FINDINGS: Dilated right abdominal small bowel loops compatible with small bowel obstruction. Some improvement since prior CT. Gas and contrast material seen within the colon. NG tube tip is at the GE junction with the side port in the distal esophagus. IMPRESSION: Continued small bowel obstruction pattern with some improvement since prior CT. NG tube tip at the GE junction. This could be advanced several cm for optimal positioning. Electronically Signed   By: Rolm Baptise M.D.   On: 03/08/2020 00:41   DG Abd Portable 1V  Result Date: 03/07/2020 CLINICAL DATA:  Small bowel  obstruction, vomiting, enteric tube EXAM: PORTABLE ABDOMEN - 1 VIEW COMPARISON:  03/06/2020 abdominal radiograph FINDINGS: Enteric tube terminates in proximal stomach. A few mildly dilated central and left small bowel loops, improved, for example 3.8 cm diameter, previously 4.8 cm. No evidence of pneumatosis or pneumoperitoneum. No radiopaque nephrolithiasis. Cholecystectomy clips are seen in the right upper quadrant of the abdomen. IMPRESSION: Enteric tube terminates in the proximal stomach. A few mildly dilated central and left small bowel loops, improved, compatible with improving small bowel obstruction. Electronically Signed   By: Ilona Sorrel M.D.   On: 03/07/2020 09:22        Scheduled Meds: . allopurinol  100 mg Oral Daily  . atorvastatin  10 mg Oral Daily  . chlorhexidine  15 mL Mouth Rinse BID  . diltiazem  240 mg Oral Daily  . mouth rinse  15 mL Mouth Rinse q12n4p  . metoprolol succinate  100 mg Oral BID  . pantoprazole (PROTONIX) IV  40 mg Intravenous Q12H   Continuous Infusions: . sodium chloride Stopped (03/06/20 1305)  . ampicillin-sulbactam (UNASYN) IV 3 g (03/08/20 1046)     LOS: 2 days    Time spent: 27 minutes     Barb Merino, MD [facility] Pager [phone]

## 2020-03-09 ENCOUNTER — Encounter (HOSPITAL_COMMUNITY): Payer: Self-pay | Admitting: Gastroenterology

## 2020-03-09 ENCOUNTER — Other Ambulatory Visit (HOSPITAL_COMMUNITY): Payer: Self-pay | Admitting: Internal Medicine

## 2020-03-09 LAB — CBC WITH DIFFERENTIAL/PLATELET
Abs Immature Granulocytes: 0.03 10*3/uL (ref 0.00–0.07)
Basophils Absolute: 0 10*3/uL (ref 0.0–0.1)
Basophils Relative: 1 %
Eosinophils Absolute: 0.5 10*3/uL (ref 0.0–0.5)
Eosinophils Relative: 6 %
HCT: 45.7 % (ref 39.0–52.0)
Hemoglobin: 14.9 g/dL (ref 13.0–17.0)
Immature Granulocytes: 0 %
Lymphocytes Relative: 27 %
Lymphs Abs: 2.2 10*3/uL (ref 0.7–4.0)
MCH: 29.1 pg (ref 26.0–34.0)
MCHC: 32.6 g/dL (ref 30.0–36.0)
MCV: 89.3 fL (ref 80.0–100.0)
Monocytes Absolute: 0.8 10*3/uL (ref 0.1–1.0)
Monocytes Relative: 9 %
Neutro Abs: 4.6 10*3/uL (ref 1.7–7.7)
Neutrophils Relative %: 57 %
Platelets: 162 10*3/uL (ref 150–400)
RBC: 5.12 MIL/uL (ref 4.22–5.81)
RDW: 13.6 % (ref 11.5–15.5)
WBC: 8.1 10*3/uL (ref 4.0–10.5)
nRBC: 0 % (ref 0.0–0.2)

## 2020-03-09 LAB — GLUCOSE, CAPILLARY
Glucose-Capillary: 108 mg/dL — ABNORMAL HIGH (ref 70–99)
Glucose-Capillary: 86 mg/dL (ref 70–99)
Glucose-Capillary: 99 mg/dL (ref 70–99)

## 2020-03-09 LAB — BASIC METABOLIC PANEL
Anion gap: 9 (ref 5–15)
BUN: 8 mg/dL (ref 8–23)
CO2: 26 mmol/L (ref 22–32)
Calcium: 8.6 mg/dL — ABNORMAL LOW (ref 8.9–10.3)
Chloride: 107 mmol/L (ref 98–111)
Creatinine, Ser: 1.06 mg/dL (ref 0.61–1.24)
GFR, Estimated: >60 mL/min (ref >60)
Glucose, Bld: 108 mg/dL — ABNORMAL HIGH (ref 70–99)
Potassium: 3.3 mmol/L — ABNORMAL LOW (ref 3.5–5.1)
Sodium: 142 mmol/L (ref 135–145)

## 2020-03-09 LAB — SURGICAL PATHOLOGY

## 2020-03-09 LAB — MAGNESIUM: Magnesium: 1.9 mg/dL (ref 1.7–2.4)

## 2020-03-09 MED ORDER — AMOXICILLIN-POT CLAVULANATE 875-125 MG PO TABS
1.0000 | ORAL_TABLET | Freq: Two times a day (BID) | ORAL | Status: DC
  Administered 2020-03-09: 1 via ORAL
  Filled 2020-03-09: qty 1

## 2020-03-09 MED ORDER — PANTOPRAZOLE SODIUM 40 MG PO TBEC: 40.0000 mg | DELAYED_RELEASE_TABLET | Freq: Every day | ORAL | 0 refills | Status: DC

## 2020-03-09 MED ORDER — POTASSIUM CHLORIDE CRYS ER 20 MEQ PO TBCR
40.0000 meq | EXTENDED_RELEASE_TABLET | Freq: Once | ORAL | Status: AC
  Administered 2020-03-09: 40 meq via ORAL
  Filled 2020-03-09: qty 2

## 2020-03-09 MED ORDER — PANTOPRAZOLE SODIUM 40 MG PO TBEC
40.0000 mg | DELAYED_RELEASE_TABLET | Freq: Two times a day (BID) | ORAL | Status: DC
  Administered 2020-03-09: 40 mg via ORAL
  Filled 2020-03-09: qty 1

## 2020-03-09 MED ORDER — APIXABAN 5 MG PO TABS: 5.0000 mg | ORAL_TABLET | Freq: Two times a day (BID) | ORAL | Status: DC

## 2020-03-09 MED ORDER — AMOXICILLIN-POT CLAVULANATE 875-125 MG PO TABS: 1.0000 | ORAL_TABLET | Freq: Two times a day (BID) | ORAL | 0 refills | Status: DC

## 2020-03-09 MED FILL — PANTOPRAZOLE SOD DR 40 MG T: 40 | 30 days supply | Qty: 30 | Fill #0

## 2020-03-09 MED FILL — AMOX TR-K CLV 875-125 MG TA: 875-125 | 4 days supply | Qty: 8 | Fill #0

## 2020-03-09 NOTE — Progress Notes (Signed)
[facility] Progress Note  1 Day Post-Op  Subjective: CC-  Feeling well this morning. Denies abdominal pain, nausea, vomiting. He had pancakes for breakfast and tolerated this well. Passing flatus. Two small, loose BMs yesterday.  Objective: Vital signs in last 24 hours: Temp:  [97.7 F (36.5 C)-98.5 F (36.9 C)] 98.2 F (36.8 C) (02/09 0422) Pulse Rate:  [47-83] 70 (02/09 0422) Resp:  [16-23] 18 (02/09 0422) BP: (130-183)/(55-84) 148/68 (02/09 0422) SpO2:  [93 %-97 %] 94 % (02/09 0422) Last BM Date: 03/07/20  Intake/Output from previous day: 02/08 0701 - 02/09 0700 In: 1660 [P.O.:1460; I.V.:200] Out: 700 [Urine:700] Intake/Output this shift: No intake/output data recorded.  PE: Gen: Alert, NAD, pleasant Abd: Soft,nondistended, NT, +BS Psych: A&Ox3  Skin: no rashes noted, warm and dry   Lab Results:  Recent Labs    03/08/20 0110 03/09/20 0135  WBC 9.0 8.1  HGB 15.3 14.9  HCT 47.3 45.7  PLT 183 162   BMET Recent Labs    03/08/20 0110 03/09/20 0135  NA 141 142  K 3.6 3.3*  CL 108 107  CO2 20* 26  GLUCOSE 86 108*  BUN 15 8  CREATININE 1.21 1.06  CALCIUM 8.5* 8.6*   PT/INR Recent Labs    03/06/20 1023 03/06/20 1205  LABPROT 14.5 15.4*  INR 1.2 1.3*   CMP     Component Value Date/Time   NA 142 03/09/2020 0135   NA 142 02/18/2020 0953   K 3.3 (L) 03/09/2020 0135   CL 107 03/09/2020 0135   CO2 26 03/09/2020 0135   GLUCOSE 108 (H) 03/09/2020 0135   BUN 8 03/09/2020 0135   BUN 19 02/18/2020 0953   CREATININE 1.06 03/09/2020 0135   CREATININE 1.77 (H) 12/24/2018 0949   CALCIUM 8.6 (L) 03/09/2020 0135   PROT 5.8 (L) 03/07/2020 0601   PROT 6.6 02/18/2020 0953   ALBUMIN 3.1 (L) 03/07/2020 0601   ALBUMIN 4.2 02/18/2020 0953   AST 18 03/07/2020 0601   ALT 9 03/07/2020 0601   ALKPHOS 64 03/07/2020 0601   BILITOT 2.4 (H) 03/07/2020 0601   BILITOT 2.4 (H) 02/18/2020 0953   GFRNONAA >60 03/09/2020 0135   GFRAA 57 (L) 02/18/2020 0953    Lipase     Component Value Date/Time   LIPASE 34 06/22/2008 0334       Studies/Results: DG Abd Portable 1V-Small Bowel Obstruction Protocol-initial, 8 hr delay  Result Date: 03/08/2020 CLINICAL DATA:  Small bowel obstruction EXAM: PORTABLE ABDOMEN - 1 VIEW COMPARISON:  03/07/2020.  CT 03/06/2020. FINDINGS: Dilated right abdominal small bowel loops compatible with small bowel obstruction. Some improvement since prior CT. Gas and contrast material seen within the colon. NG tube tip is at the GE junction with the side port in the distal esophagus. IMPRESSION: Continued small bowel obstruction pattern with some improvement since prior CT. NG tube tip at the GE junction. This could be advanced several cm for optimal positioning. Electronically Signed   By: Rolm Baptise M.D.   On: 03/08/2020 00:41    Anti-infectives: Anti-infectives (From admission, onward)   Start     Dose/Rate Route Frequency Ordered Stop   03/09/20 1000  amoxicillin-clavulanate (AUGMENTIN) 875-125 MG per tablet 1 tablet        1 tablet Oral Every 12 hours 03/09/20 0835 03/13/20 0959   03/06/20 1800  Ampicillin-Sulbactam (UNASYN) 3 g in sodium chloride 0.9 % 100 mL IVPB  Status:  Discontinued        3 g  200 mL/hr over 30 Minutes Intravenous Every 6 hours 03/06/20 1724 03/09/20 0835   03/06/20 1200  Ampicillin-Sulbactam (UNASYN) 3 g in sodium chloride 0.9 % 100 mL IVPB        3 g 200 mL/hr over 30 Minutes Intravenous  Once 03/06/20 1159 03/06/20 1305       Assessment/Plan CAD A. Fib on Eliquis OSA HTN HLD LLL PNA Diverticulosis - No diverticulitis on CT  Melena & Coffee ground emesis- hgb 14.9, stable. EGD 2/8 negative for significant pathology  SBO vs pSBO - Hx of Lap Chole. No prior hx of SBO's - Started SBP 2/7 and delayed film showed contrast in colon - Bowel function returned and patient is tolerating a soft diet without abdominal pain, n/v. Ok for discharge from surgical standpoint. No surgical follow  up needed. We will sign off, please call with concerns.   FEN - soft diet VTE -SCDs, ok for chemical DVT prophylaxis from surgical standpoint ID -Unasyn/augmentin per [facility] for PNA   LOS: 3 days    Wellington Hampshire, [facility] 03/09/2020, 9:37 AM Please see Amion for pager number during day hours 7:00am-4:30pm

## 2020-03-09 NOTE — Evaluation (Signed)
Physical Therapy Evaluation Patient Details Name: [name] Woods MRN: [id number] DOB: [birth year] Today's Date: 03/09/2020   History of Present Illness  OSA CAMPOLI is a 80 y.o. male with medical history significant of CAD, A. fib on Eliquis, HTN, HLD, OSA, diverticulosis who presented with abdominal cramping, dark melanotic stool and dark coffee-ground emesis.  Clinical Impression  Patient received in bed, wife present. He reports he is feeling fine and wants to go home. He is agreeable to PT assessment. Patient is independent with bed mobility and transfers, donned shoes for ambulation. He ambulated 300 feet without ad and no assistance. No difficulties noted. Patient does not have further PT needs at this time and should be discharging home shortly. PT to sign off.     Follow Up Recommendations No PT follow up    Equipment Recommendations  None recommended by PT    Recommendations for Other Services       Precautions / Restrictions Precautions Precautions: None Restrictions Weight Bearing Restrictions: No      Mobility  Bed Mobility Overal bed mobility: Independent                  Transfers Overall transfer level: Independent                  Ambulation/Gait Ambulation/Gait assistance: Independent Gait Distance (Feet): 300 Feet Assistive device: None Gait Pattern/deviations: Step-through pattern Gait velocity: normal   General Gait Details: no difficulties with ambulation, no pain, sob or safety issues identified.  Stairs            Wheelchair Mobility    Modified Rankin (Stroke Patients Only)       Balance Overall balance assessment: Independent                                           Pertinent Vitals/Pain Pain Assessment: No/denies pain    Home Living Family/patient expects to be discharged to:: Private residence Living Arrangements: Spouse/significant other Available Help at Discharge:  Family;Available PRN/intermittently Type of Home: Apartment Home Access: Level entry     Home Layout: One level Home Equipment: None      Prior Function Level of Independence: Independent         Comments: patient is fully independent at home, still works some     Journalist, newspaper        Extremity/Trunk Assessment   Upper Extremity Assessment Upper Extremity Assessment: Overall WFL for tasks assessed    Lower Extremity Assessment Lower Extremity Assessment: Overall WFL for tasks assessed    Cervical / Trunk Assessment Cervical / Trunk Assessment: Normal  Communication   Communication: No difficulties  Cognition Arousal/Alertness: Awake/alert Behavior During Therapy: WFL for tasks assessed/performed Overall Cognitive Status: Within Functional Limits for tasks assessed                                        General Comments      Exercises     Assessment/Plan    PT Assessment Patent does not need any further PT services  PT Problem List         PT Treatment Interventions      PT Goals (Current goals can be found in the Care Plan section)  Acute Rehab PT Goals Patient Stated Goal:  to go home PT Goal Formulation: With patient/family Time For Goal Achievement: 03/09/20 Potential to Achieve Goals: Good    Frequency     Barriers to discharge        Co-evaluation               AM-PAC PT "6 Clicks" Mobility  Outcome Measure Help needed turning from your back to your side while in a flat bed without using bedrails?: None Help needed moving from lying on your back to sitting on the side of a flat bed without using bedrails?: None Help needed moving to and from a bed to a chair (including a wheelchair)?: None Help needed standing up from a chair using your arms (e.g., wheelchair or bedside chair)?: None Help needed to walk in hospital room?: None Help needed climbing 3-5 steps with a railing? : None 6 Click Score: 24    End of  Session   Activity Tolerance: Patient tolerated treatment well Patient left: in bed;with family/visitor present;with nursing/sitter in room Nurse Communication: Mobility status      Time: 1130-1145 PT Time Calculation (min) (ACUTE ONLY): 15 min   Charges:   PT Evaluation $PT Eval Moderate Complexity: 1 Mod          Percival Glasheen, PT, GCS 03/09/20,12:07 PM

## 2020-03-09 NOTE — Discharge Summary (Signed)
Physician Discharge Summary  KWAN SHELLHAMMER VWU:[id number] DOB: [birth year] DOA: 03/06/2020  PCP: Brunetta Jeans, PA-C  Admit date: 03/06/2020 Discharge date: 03/09/2020  Admitted From:  Home.  Disposition:  Home   Recommendations for Outpatient Follow-up:  1. Follow up with PCP in 1-2 weeks 2. Please obtain BMP/CBC in one week 3. Please follow up with gastroenterology as recommended.  4. Please follow up with surgery as needed.     Discharge Condition: stable.  CODE STATUS:Full code.  Diet recommendation: Heart Healthy    Brief/Interim Summary:  80 year old male with history of coronary artery disease, A. fib on Eliquis, hypertension, hyperlipidemia, obstructive sleep apnea and diverticulosis presented to the hospital with abdominal cramping, dark melanotic stool and frequent vomiting associated with coffee-ground emesis for about 2 days. In the emergency room afebrile.  Hemodynamically stable.  On room air.  WBC 16.9.  Hemoglobin 19.  Negative FOBT.  EKG with A. fib.  CT scan abdomen showed ileus, left lower lobe airspace disease.  Patient was given IV Protonix, Unasyn, NG tube and admit to the hospital with surgical and GI consultation.  Discharge Diagnoses:  Principal Problem:   GI bleed Active Problems:   Essential hypertension   NEPHROLITHIASIS, HX OF   CAD (coronary artery disease)   GERD (gastroesophageal reflux disease)   Chronic anticoagulation   Chronic atrial fibrillation (HCC)   Aspiration pneumonia (HCC)   Ileus (HCC)   Partial small bowel obstruction (HCC)   OSA (obstructive sleep apnea)   Gastric nodule    Partial small bowel obstruction due to adhesions: History of cholecystectomy Clinically improving Followed by surgery.  NG tube out.  Now with adequate bowel function.  Started on clears, advanced to soft diet without any issues. Cleared for discharge.   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  Treated with Protonix 40 mg IV twice daily.  Last dose of Eliquis on 2/5. Underwent upper GI endoscopy today, did not find any evidence of bleeding.  Suspect it might have suffered a small Mallory-Weiss tear.  Hemoglobin is stable.  resume Eliquis for discharge.   Left lower lobe airspace disease: Aspiration pneumonitis versus pneumonia.  Currently asymptomatic.  started on  Unasyn transitioned to augmentin to complete the course. .  Blood cultures negative so far.     Chronic A. fib: Rate controlled on Eliquis.  Eliquis on hold.  Cardizem able to control his rate.  Other medical problems including hypertension hyperlipidemia: Stable.  Will resume medications once patient is able to go home.   Discharge Instructions  Discharge Instructions    Diet - low sodium heart healthy   Complete by: As directed    Discharge instructions   Complete by: As directed    Please follow up with GI regarding the biopsy results.     Allergies as of 03/09/2020      Reactions   Coumadin [warfarin] Other (See Comments)   GI bleeds      Medication List    TAKE these medications   allopurinol 100 MG tablet Commonly known as: ZYLOPRIM TAKE 1 TABLET EVERY DAY   amoxicillin-clavulanate 875-125 MG tablet Commonly known as: AUGMENTIN Take 1 tablet by mouth every 12 (twelve) hours for 4 days.   atorvastatin 10 MG tablet Commonly known as: LIPITOR TAKE 1 TABLET EVERY DAY   diltiazem 240 MG 24 hr capsule Commonly known as: CARDIZEM CD Take 1 capsule (240 mg total) by mouth daily. Please  call to schedule appointment with Dr. Percival Spanish in January 2022 for refills.   Eliquis 5 MG Tabs tablet Generic drug: apixaban TAKE 1 TABLET BY MOUTH 2 TIMES DAILY. What changed: how much to take   furosemide 20 MG tablet Commonly known as: LASIX Take 1 tablet (20 mg total) by mouth daily. Please call to schedule appointment with Dr. Percival Spanish in January 2022 for refills.    HYDROcodone-acetaminophen 10-325 MG tablet Commonly known as: NORCO Take 1 tablet by mouth every 6 (six) hours as needed. What changed: reasons to take this   metoprolol succinate 100 MG 24 hr tablet Commonly known as: TOPROL-XL TAKE 1 TABLET  TWO TIMES DAILY  WITH OR IMMEDIATELY FOLLOWING A MEAL. What changed: See the new instructions.   pantoprazole 40 MG tablet Commonly known as: PROTONIX Take 1 tablet (40 mg total) by mouth daily.   sildenafil 20 MG tablet Commonly known as: REVATIO TAKE 1 TABLET BY MOUTH AS NEEDED FOR ERECTILE DYSFUNCTION. DO NO TAKE MORE THAN 1 DOSE IN 36 HOURS. What changed:   how much to take  how to take this  when to take this  reasons to take this  additional instructions       Follow-up Information    Brunetta Jeans, PA-C. Schedule an appointment as soon as possible for a visit in 1 week(s).   Specialty: Family Medicine Contact information: [address] [place] [address] [phone]        Minus Breeding, MD .   Specialty: Cardiology Contact information: [address] [place] [address] [phone]        Yetta Flock, MD. Schedule an appointment as soon as possible for a visit in 1 week(s).   Specialty: Gastroenterology Contact information: [address] [place] [address] [phone]              Allergies  Allergen Reactions  . Coumadin [Warfarin] Other (See Comments)    GI bleeds    Consultations:  Surgery.    Procedures/Studies: DG Abdomen 1 View  Result Date: 03/06/2020 CLINICAL DATA:  NG tube placement. EXAM: ABDOMEN - 1 VIEW COMPARISON:  CT abdomen pelvis-earlier same day FINDINGS: Enteric tube side port projected the expected location the gastroesophageal junction. Moderate to marked gaseous distension of several loops of small bowel with index loop of small bowel in the left mid hemiabdomen measuring 4.8 cm in diameter. This finding is  associated with a conspicuous paucity of distal colonic gas. No supine evidence of pneumoperitoneum. No pneumatosis or portal venous gas. Post cholecystectomy. Calcifications overlies expected location of the bilateral renal fossa compatible with known nephrolithiasis. No acute osseous abnormalities. IMPRESSION: 1. Enteric tube side port projects over the expected location of the gastroesophageal junction. Advancement approximately 10 cm is. 2. Similar findings again worrisome for least partial small bowel obstruction. 3. Bilateral nephrolithiasis as was demonstrated on preceding abdominal CT. Electronically Signed   By: Sandi Mariscal M.D.   On: 03/06/2020 13:49   CT ABDOMEN PELVIS W CONTRAST  Result Date: 03/06/2020 CLINICAL DATA:  Generalized abdominal pain, bloating. EXAM: CT ABDOMEN AND PELVIS WITH CONTRAST TECHNIQUE: Multidetector CT imaging of the abdomen and pelvis was performed using the standard protocol following bolus administration of intravenous contrast. CONTRAST:  19mL OMNIPAQUE IOHEXOL 300 MG/ML  SOLN COMPARISON:  December 24, 2018. FINDINGS: Lower chest: Focal left lower lobe airspace opacity is noted concerning for pneumonia. Hepatobiliary: No focal liver abnormality is seen. Status  post cholecystectomy. No biliary dilatation. Pancreas: Unremarkable. No pancreatic ductal dilatation or surrounding inflammatory changes. Spleen: Normal in size without focal abnormality. Adrenals/Urinary Tract: Adrenal glands appear normal. Bilateral nonobstructive nephrolithiasis is noted. Bilateral renal cortical scarring is noted. Bilateral renal cysts are noted. No hydronephrosis or renal obstruction is noted. Urinary bladder is unremarkable. Stomach/Bowel: Mildly distended and fluid-filled stomach is noted. Mild proximal small bowel dilatation is noted without definite transition zone; is uncertain if this represents ileus or possibly partial small bowel obstruction. No colonic dilatation is noted. Sigmoid  diverticulosis is noted without inflammation. The stomach appears normal. Vascular/Lymphatic: Aortic atherosclerosis. No enlarged abdominal or pelvic lymph nodes. Reproductive: Stable mild prostatic enlargement is noted. Other: No abdominal wall hernia or abnormality. No abdominopelvic ascites. Musculoskeletal: No acute or significant osseous findings. IMPRESSION: 1. Focal left lower lobe airspace opacity is noted concerning for pneumonia. 2. Bilateral nonobstructive nephrolithiasis. No hydronephrosis or renal obstruction is noted. 3. Sigmoid diverticulosis without inflammation. 4. Stable mild prostatic enlargement. 5. Mild proximal small bowel dilatation is noted without definite transition zone; is uncertain if this represents ileus or possibly partial small bowel obstruction. 6. Aortic atherosclerosis. Aortic Atherosclerosis (ICD10-I70.0). Electronically Signed   By: Marijo Conception M.D.   On: 03/06/2020 11:46   DG Chest Portable 1 View  Result Date: 03/06/2020 CLINICAL DATA:  Advancement of nasogastric tube. EXAM: PORTABLE CHEST 1 VIEW COMPARISON:  Same day. FINDINGS: Distal tip of nasogastric tube is seen at the expected position of the gastroesophageal junction. This is not significantly changed compared to prior exam. IMPRESSION: No significant change compared to prior exam. Distal tip of nasogastric tube seen at the expected position of the gastroesophageal junction. Electronically Signed   By: Marijo Conception M.D.   On: 03/06/2020 14:47   DG Abd Portable 1V-Small Bowel Obstruction Protocol-initial, 8 hr delay  Result Date: 03/08/2020 CLINICAL DATA:  Small bowel obstruction EXAM: PORTABLE ABDOMEN - 1 VIEW COMPARISON:  03/07/2020.  CT 03/06/2020. FINDINGS: Dilated right abdominal small bowel loops compatible with small bowel obstruction. Some improvement since prior CT. Gas and contrast material seen within the colon. NG tube tip is at the GE junction with the side port in the distal esophagus.  IMPRESSION: Continued small bowel obstruction pattern with some improvement since prior CT. NG tube tip at the GE junction. This could be advanced several cm for optimal positioning. Electronically Signed   By: Rolm Baptise M.D.   On: 03/08/2020 00:41   DG Abd Portable 1V  Result Date: 03/07/2020 CLINICAL DATA:  Small bowel obstruction, vomiting, enteric tube EXAM: PORTABLE ABDOMEN - 1 VIEW COMPARISON:  03/06/2020 abdominal radiograph FINDINGS: Enteric tube terminates in proximal stomach. A few mildly dilated central and left small bowel loops, improved, for example 3.8 cm diameter, previously 4.8 cm. No evidence of pneumatosis or pneumoperitoneum. No radiopaque nephrolithiasis. Cholecystectomy clips are seen in the right upper quadrant of the abdomen. IMPRESSION: Enteric tube terminates in the proximal stomach. A few mildly dilated central and left small bowel loops, improved, compatible with improving small bowel obstruction. Electronically Signed   By: Ilona Sorrel M.D.   On: 03/07/2020 09:22       Subjective: No new complaints.   Discharge Exam: Vitals:   03/09/20 0422 03/09/20 0937  BP: (!) 148/68 (!) 154/104  Pulse: 70 98  Resp: 18   Temp: 98.2 F (36.8 C)   SpO2: 94% 92%   Vitals:   03/08/20 2052 03/09/20 0027 03/09/20 0422 03/09/20 0937  BP: Marland Kitchen)  155/84 (!) 133/55 (!) 148/68 (!) 154/104  Pulse: (!) 47 62 70 98  Resp: 18 16 18    Temp: 98.1 F (36.7 C) 98.1 F (36.7 C) 98.2 F (36.8 C)   TempSrc: Oral Oral Oral   SpO2: 97% 93% 94% 92%  Weight:      Height:        General: Pt is alert, awake, not in acute distress Cardiovascular: RRR, S1/S2 +, no rubs, no gallops Respiratory: CTA bilaterally, no wheezing, no rhonchi Abdominal: Soft, NT, ND, bowel sounds + Extremities: no edema, no cyanosis    The results of significant diagnostics from this hospitalization (including imaging, microbiology, ancillary and laboratory) are listed below for reference.      Microbiology: Recent Results (from the past 240 hour(s))  SARS Coronavirus 2 by RT PCR (hospital order, performed in [facility] hospital lab) Nasopharyngeal Nasopharyngeal Swab     Status: None   Collection Time: 03/06/20 10:56 AM   Specimen: Nasopharyngeal Swab  Result Value Ref Range Status   SARS Coronavirus 2 NEGATIVE NEGATIVE Final    Comment: (NOTE) SARS-CoV-2 target nucleic acids are NOT DETECTED.  The SARS-CoV-2 RNA is generally detectable in upper and lower respiratory specimens during the acute phase of infection. The lowest concentration of SARS-CoV-2 viral copies this assay can detect is 250 copies / mL. A negative result does not preclude SARS-CoV-2 infection and should not be used as the sole basis for treatment or other patient management decisions.  A negative result may occur with improper specimen collection / handling, submission of specimen other than nasopharyngeal swab, presence of viral mutation(s) within the areas targeted by this assay, and inadequate number of viral copies (<250 copies / mL). A negative result must be combined with clinical observations, patient history, and epidemiological information.  Fact Sheet for Patients:   StrictlyIdeas.no  Fact Sheet for Healthcare Providers: BankingDealers.co.za  This test is not yet approved or  cleared by the [place] FDA and has been authorized for detection and/or diagnosis of SARS-CoV-2 by FDA under an Emergency Use Authorization (EUA).  This EUA will remain in effect (meaning this test can be used) for the duration of the COVID-19 declaration under Section 564(b)(1) of the Act, 21 U.S.C. section 360bbb-3(b)(1), unless the authorization is terminated or revoked sooner.  Performed at [facility], [address] [place], [place] [address]   Culture, blood (Routine X 2) w Reflex to ID Panel     Status: None (Preliminary result)    Collection Time: 03/06/20  6:17 PM   Specimen: BLOOD  Result Value Ref Range Status   Specimen Description BLOOD RIGHT ANTECUBITAL  Final   Special Requests   Final    BOTTLES DRAWN AEROBIC ONLY Blood Culture adequate volume   Culture   Final    NO GROWTH 3 DAYS Performed at [facility], [address], [place], [place] [address]    Report Status PENDING  Incomplete  Culture, blood (Routine X 2) w Reflex to ID Panel     Status: None (Preliminary result)   Collection Time: 03/06/20  6:17 PM   Specimen: BLOOD RIGHT HAND  Result Value Ref Range Status   Specimen Description BLOOD RIGHT HAND  Final   Special Requests   Final    BOTTLES DRAWN AEROBIC ONLY Blood Culture adequate volume   Culture   Final    NO GROWTH 3 DAYS Performed at [facility], [address], [place], [place]  [address]    Report Status PENDING  Incomplete     Labs: BNP (last 3 results) No results for input(s): BNP in the last 8760 hours. Basic Metabolic Panel: Recent Labs  Lab 03/06/20 1023 03/07/20 0601 03/08/20 0110 03/09/20 0135  NA 139 143 141 142  K 3.7 3.7 3.6 3.3*  CL 99 107 108 107  CO2 27 24 20* 26  GLUCOSE 144* 88 86 108*  BUN 16 14 15 8   CREATININE 1.40* 1.41* 1.21 1.06  CALCIUM 9.7 8.9 8.5* 8.6*  MG  --  1.8 2.0 1.9  PHOS  --   --  2.8  --    Liver Function Tests: Recent Labs  Lab 03/06/20 1023 03/07/20 0601  AST 26 18  ALT 16 9  ALKPHOS 85 64  BILITOT 2.5* 2.4*  PROT 7.7 5.8*  ALBUMIN 4.2 3.1*   No results for input(s): LIPASE, AMYLASE in the last 168 hours. No results for input(s): AMMONIA in the last 168 hours. CBC: Recent Labs  Lab 03/06/20 1023 03/06/20 1737 03/07/20 0601 03/07/20 1148 03/07/20 1746 03/08/20 0110 03/09/20 0135  WBC 16.9*  --  10.3  --   --  9.0 8.1  NEUTROABS 13.8*  --  6.5  --   --   --  4.6  HGB 19.0*   < > 17.0 16.9 16.1 15.3 14.9  HCT 56.8*   < > 50.2 50.1 49.2 47.3 45.7  MCV 88.5  --  89.5  --   --  89.9 89.3  PLT 265  --   193  --   --  183 162   < > = values in this interval not displayed.   Cardiac Enzymes: No results for input(s): CKTOTAL, CKMB, CKMBINDEX, TROPONINI in the last 168 hours. BNP: Invalid input(s): POCBNP CBG: Recent Labs  Lab 03/08/20 1620 03/08/20 2050 03/09/20 0024 03/09/20 0429 03/09/20 0723  GLUCAP 140* 87 108* 86 99   D-Dimer No results for input(s): DDIMER in the last 72 hours. Hgb A1c Recent Labs    03/07/20 0023  HGBA1C 5.9*   Lipid Profile No results for input(s): CHOL, HDL, LDLCALC, TRIG, CHOLHDL, LDLDIRECT in the last 72 hours. Thyroid function studies No results for input(s): TSH, T4TOTAL, T3FREE, THYROIDAB in the last 72 hours.  Invalid input(s): FREET3 Anemia work up No results for input(s): VITAMINB12, FOLATE, FERRITIN, TIBC, IRON, RETICCTPCT in the last 72 hours. Urinalysis    Component Value Date/Time   COLORURINE YELLOW 06/16/2015 1728   APPEARANCEUR CLEAR 06/16/2015 1728   LABSPEC 1.008 06/16/2015 1728   PHURINE 6.5 06/16/2015 1728   GLUCOSEU NEGATIVE 06/16/2015 1728   GLUCOSEU NEGATIVE 09/22/2014 1021   HGBUR NEGATIVE 06/16/2015 1728   HGBUR large 05/16/2009 1014   BILIRUBINUR negative 12/24/2018 0943   KETONESUR NEGATIVE 06/16/2015 1728   PROTEINUR Positive (A) 12/24/2018 0943   PROTEINUR NEGATIVE 06/16/2015 1728   UROBILINOGEN 0.2 12/24/2018 0943   UROBILINOGEN 0.2 09/22/2014 1021   NITRITE negative 12/24/2018 0943   NITRITE NEGATIVE 06/16/2015 1728   LEUKOCYTESUR Negative 12/24/2018 0943   Sepsis Labs Invalid input(s): PROCALCITONIN,  WBC,  LACTICIDVEN Microbiology Recent Results (from the past 240 hour(s))  SARS Coronavirus 2 by RT PCR (hospital order, performed in [facility] hospital lab) Nasopharyngeal Nasopharyngeal Swab     Status: None   Collection Time: 03/06/20 10:56 AM   Specimen: Nasopharyngeal Swab  Result Value Ref Range Status   SARS Coronavirus 2 NEGATIVE NEGATIVE Final    Comment: (NOTE) SARS-CoV-2 target nucleic  acids are NOT DETECTED.  The SARS-CoV-2 RNA is generally detectable in upper and lower respiratory specimens during the acute phase of infection. The lowest concentration of SARS-CoV-2 viral copies this assay can detect is 250 copies / mL. A negative result does not preclude SARS-CoV-2 infection and should not be used as the sole basis for treatment or other patient management decisions.  A negative result may occur with improper specimen collection / handling, submission of specimen other than nasopharyngeal swab, presence of viral mutation(s) within the areas targeted by this assay, and inadequate number of viral copies (<250 copies / mL). A negative result must be combined with clinical observations, patient history, and epidemiological information.  Fact Sheet for Patients:   StrictlyIdeas.no  Fact Sheet for Healthcare Providers: BankingDealers.co.za  This test is not yet approved or  cleared by the [place] FDA and has been authorized for detection and/or diagnosis of SARS-CoV-2 by FDA under an Emergency Use Authorization (EUA).  This EUA will remain in effect (meaning this test can be used) for the duration of the COVID-19 declaration under Section 564(b)(1) of the Act, 21 U.S.C. section 360bbb-3(b)(1), unless the authorization is terminated or revoked sooner.  Performed at [facility], [address] [place], [place] [address]   Culture, blood (Routine X 2) w Reflex to ID Panel     Status: None (Preliminary result)   Collection Time: 03/06/20  6:17 PM   Specimen: BLOOD  Result Value Ref Range Status   Specimen Description BLOOD RIGHT ANTECUBITAL  Final   Special Requests   Final    BOTTLES DRAWN AEROBIC ONLY Blood Culture adequate volume   Culture   Final    NO GROWTH 3 DAYS Performed at [facility], [address], [place], [place] [address]    Report Status PENDING  Incomplete  Culture, blood  (Routine X 2) w Reflex to ID Panel     Status: None (Preliminary result)   Collection Time: 03/06/20  6:17 PM   Specimen: BLOOD RIGHT HAND  Result Value Ref Range Status   Specimen Description BLOOD RIGHT HAND  Final   Special Requests   Final    BOTTLES DRAWN AEROBIC ONLY Blood Culture adequate volume   Culture   Final    NO GROWTH 3 DAYS Performed at [facility], [place] [address], [place], [place] [address]    Report Status PENDING  Incomplete     Time coordinating discharge: 32 minutes.   SIGNED:   Hosie Poisson, MD  [facility]

## 2020-03-09 NOTE — Progress Notes (Signed)
ANTICOAGULATION CONSULT NOTE - Initial Consult  Pharmacy Consult for apixaban Indication: atrial fibrillation  Allergies  Allergen Reactions  . Coumadin [Warfarin] Other (See Comments)    GI bleeds    Patient Measurements: Height: 6' (182.9 cm) Weight: 90.3 kg (199 lb) IBW/kg (Calculated) : 77.6   Vital Signs: Temp: 98.2 F (36.8 C) (02/09 0422) Temp Source: Oral (02/09 0422) BP: 154/104 (02/09 0937) Pulse Rate: 98 (02/09 0937)  Labs: Recent Labs    03/06/20 1205 03/06/20 1307 03/06/20 1737 03/07/20 0601 03/07/20 1148 03/07/20 1746 03/08/20 0110 03/09/20 0135  HGB  --   --    < > 17.0   < > 16.1 15.3 14.9  HCT  --   --    < > 50.2   < > 49.2 47.3 45.7  PLT  --   --   --  193  --   --  183 162  LABPROT 15.4*  --   --   --   --   --   --   --   INR 1.3*  --   --   --   --   --   --   --   CREATININE  --   --   --  1.41*  --   --  1.21 1.06  TROPONINIHS  --  12  --   --   --   --   --   --    < > = values in this interval not displayed.    Estimated Creatinine Clearance: 62 mL/min (by C-G formula based on SCr of 1.06 mg/dL).   Medical History: Past Medical History:  Diagnosis Date  . Ankylosing spondylitis ([place])   . Aortic atherosclerosis ([place])   . Arthritis   . Bilateral inguinal hernia   . CAD (coronary artery disease) 4010,2725   30% mid LAD lesion on cardiac cath  . Chicken pox   . Cholelithiasis   . Diverticulosis 11/26/2018   Moderate, Left Noted on Colonoscopy  . Dysrhythmia    atrial fibrillation  . Gallstones   . GERD (gastroesophageal reflux disease)   . [place] measles   . Hiatal hernia   . History of colon polyps 11/26/2018  . History of kidney stones   . HLD (hyperlipidemia)   . HTN (hypertension)   . Hypertension   . Nephrolithiasis   . Persistent atrial fibrillation ([place])   . Sleep apnea    No CPAP  . 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 Ventricular hypertrophy     Medications:  Scheduled:  . allopurinol  100 mg Oral Daily  .  amoxicillin-clavulanate  1 tablet Oral Q12H  . atorvastatin  10 mg Oral Daily  . chlorhexidine  15 mL Mouth Rinse BID  . diltiazem  240 mg Oral Daily  . mouth rinse  15 mL Mouth Rinse q12n4p  . metoprolol succinate  100 mg Oral BID  . pantoprazole  40 mg Oral BID    Assessment: 80 yo male on apixaban PTA for afib. This has been on hold for partial SBO and coffee ground emesis.  EGD 1/8 without evidence of bleeding. Pharmacy consulted to restart apixaban -Wt= 90.3, SCr= 1.0 -Hg= 14.9  Goal of Therapy:  Monitor platelets by anticoagulation protocol: Yes   Plan:   -apixaban 5mg  po bid -Will sign off. Please contact pharmacy with any other needs.  Thank you Hildred Laser, PharmD Clinical Pharmacist **Pharmacist phone directory can now be found on [email or web address] ([email or web address]).  Listed under [place].

## 2020-03-10 ENCOUNTER — Other Ambulatory Visit: Payer: Self-pay | Admitting: Physician Assistant

## 2020-03-10 DIAGNOSIS — M459 Ankylosing spondylitis of unspecified sites in spine: Secondary | ICD-10-CM

## 2020-03-11 ENCOUNTER — Other Ambulatory Visit: Payer: Self-pay | Admitting: Family

## 2020-03-11 ENCOUNTER — Telehealth: Payer: Self-pay | Admitting: Gastroenterology

## 2020-03-11 ENCOUNTER — Telehealth: Payer: Self-pay

## 2020-03-11 LAB — CULTURE, BLOOD (ROUTINE X 2)
Culture: NO GROWTH
Culture: NO GROWTH
Special Requests: ADEQUATE
Special Requests: ADEQUATE

## 2020-03-11 NOTE — Telephone Encounter (Signed)
Left message for patient to return my call.

## 2020-03-11 NOTE — Telephone Encounter (Signed)
Inbound call from patient's wife stating he was recently prescribed antibiotics when he was admitted to the hospital and they are making him nauseous.  Wants to know if something can be sent to [facility] on [address] if possible please.

## 2020-03-11 NOTE — Telephone Encounter (Signed)
Transition Care Management Follow-up Telephone Call  Date of discharge and from where: 03/09/20-[facility]  How have you been since you were released from the hospital? Doing Alright but the antibiotic is mmaking me a little nauseas & I need a refill of my Norco.  Any questions or concerns? No  Items Reviewed:  Did the pt receive and understand the discharge instructions provided? Yes   Medications obtained and verified? Yes   Other? Yes   Any new allergies since your discharge? No   Dietary orders reviewed? Yes  Do you have support at home? Yes   Home Care and Equipment/Supplies: Were home health services ordered? no If so, what is the name of the agency? n/a  Has the agency set up a time to come to the patient's home? not applicable Were any new equipment or medical supplies ordered?  No What is the name of the medical supply agency? n/a Were you able to get the supplies/equipment? not applicable Do you have any questions related to the use of the equipment or supplies? n/a  Functional Questionnaire: (I = Independent and D = Dependent) ADLs: I  Bathing/Dressing- I  Meal Prep- I  Eating- I  Maintaining continence- I  Transferring/Ambulation- I  Managing Meds- I  Follow up appointments reviewed:   PCP Hospital f/u appt confirmed? Yes  Scheduled to see Allyssa Allwardt on 03/16/20 @ 11:00.  [facility] f/u appt confirmed? Yes  Scheduled to see Dr. Fuller Plan on 04/07/20 @ 9:10.  Are transportation arrangements needed? No   If their condition worsens, is the pt aware to call PCP or go to the Emergency Dept.? Yes  Was the patient provided with contact information for the PCP's office or ED? Yes  Was to pt encouraged to call back with questions or concerns? Yes

## 2020-03-11 NOTE — Telephone Encounter (Signed)
[name] Woods, Patient is a previous patient of [name] at [name]. He has an upcoming appt with [name] to establish care on 04/15/20. He just got out of the hospital on 03/09/20 & needs a [name] F/U/ TCM visit. Per [facility], appt made with you on 03/16/20 for his TCM visit. He is asking for a refill of his Norco also.

## 2020-03-11 NOTE — Telephone Encounter (Signed)
Indication for chronic opioid: Chronic OA, AS Medication and dose: Norco 10/325 mg # pills per month: #75 on 02/11/20 Last UDS date: 04/01/19 Opioid Treatment Agreement signed (Y/N): Yes, 04/01/19 Opioid Treatment Agreement last reviewed with patient:  02/11/20 NCCSRS reviewed this encounter (include red flags):     LOV: 02/11/20 Chronic pain

## 2020-03-11 NOTE — Telephone Encounter (Signed)
Informed patient after reviewing his chart, he was prescribed Augmentin in the hospital for possible inflammation in his lungs v.s pneumonia. Informed patient to contact his PCP to possibly change dosage of medication or a different medication. Patient states he is changing his PCP currently and does not have one. Informed patient that he still needs to contact them and have them review his chart. Unfortunately, we did not prescribe the medication and it also not a GI related reason for taking the medication.

## 2020-03-11 NOTE — Telephone Encounter (Signed)
Sounds good, thank you for the heads up!

## 2020-03-14 MED FILL — HYDROCODON-APAP 10-325: 10-325 | 22 days supply | Qty: 90 | Fill #0

## 2020-03-16 ENCOUNTER — Other Ambulatory Visit: Payer: Self-pay | Admitting: Cardiology

## 2020-03-16 ENCOUNTER — Ambulatory Visit (INDEPENDENT_AMBULATORY_CARE_PROVIDER_SITE_OTHER): Payer: Medicare HMO | Admitting: Physician Assistant

## 2020-03-16 ENCOUNTER — Other Ambulatory Visit: Payer: Self-pay

## 2020-03-16 ENCOUNTER — Encounter: Payer: Self-pay | Admitting: Physician Assistant

## 2020-03-16 VITALS — BP 131/76 | HR 65 | Temp 97.6°F | Ht 72.0 in | Wt 203.5 lb

## 2020-03-16 DIAGNOSIS — K920 Hematemesis: Secondary | ICD-10-CM | POA: Diagnosis not present

## 2020-03-16 DIAGNOSIS — K56600 Partial intestinal obstruction, unspecified as to cause: Secondary | ICD-10-CM

## 2020-03-16 DIAGNOSIS — I482 Chronic atrial fibrillation, unspecified: Secondary | ICD-10-CM | POA: Diagnosis not present

## 2020-03-16 DIAGNOSIS — I10 Essential (primary) hypertension: Secondary | ICD-10-CM

## 2020-03-16 DIAGNOSIS — J690 Pneumonitis due to inhalation of food and vomit: Secondary | ICD-10-CM

## 2020-03-16 DIAGNOSIS — K5901 Slow transit constipation: Secondary | ICD-10-CM | POA: Diagnosis not present

## 2020-03-16 LAB — CBC WITH DIFFERENTIAL/PLATELET
Basophils Absolute: 0.1 10*3/uL (ref 0.0–0.1)
Basophils Relative: 0.8 % (ref 0.0–3.0)
Eosinophils Absolute: 0.3 10*3/uL (ref 0.0–0.7)
Eosinophils Relative: 2.9 % (ref 0.0–5.0)
HCT: 47.5 % (ref 39.0–52.0)
Hemoglobin: 15.9 g/dL (ref 13.0–17.0)
Lymphocytes Relative: 22.6 % (ref 12.0–46.0)
Lymphs Abs: 2 10*3/uL (ref 0.7–4.0)
MCHC: 33.5 g/dL (ref 30.0–36.0)
MCV: 88.8 fl (ref 78.0–100.0)
Monocytes Absolute: 0.8 10*3/uL (ref 0.1–1.0)
Monocytes Relative: 8.3 % (ref 3.0–12.0)
Neutro Abs: 5.9 10*3/uL (ref 1.4–7.7)
Neutrophils Relative %: 65.4 % (ref 43.0–77.0)
Platelets: 226 10*3/uL (ref 150.0–400.0)
RBC: 5.36 Mil/uL (ref 4.22–5.81)
RDW: 14.5 % (ref 11.5–15.5)
WBC: 9 10*3/uL (ref 4.0–10.5)

## 2020-03-16 LAB — COMPREHENSIVE METABOLIC PANEL
ALT: 17 U/L (ref 0–53)
AST: 21 U/L (ref 0–37)
Albumin: 3.8 g/dL (ref 3.5–5.2)
Alkaline Phosphatase: 73 U/L (ref 39–117)
BUN: 17 mg/dL (ref 6–23)
CO2: 30 mEq/L (ref 19–32)
Calcium: 9.4 mg/dL (ref 8.4–10.5)
Chloride: 102 mEq/L (ref 96–112)
Creatinine, Ser: 1.33 mg/dL (ref 0.40–1.50)
GFR: 50.99 mL/min — ABNORMAL LOW (ref >60.00)
Glucose, Bld: 118 mg/dL — ABNORMAL HIGH (ref 70–99)
Potassium: 3.5 mEq/L (ref 3.5–5.1)
Sodium: 140 mEq/L (ref 135–145)
Total Bilirubin: 1.9 mg/dL — ABNORMAL HIGH (ref 0.2–1.2)
Total Protein: 6.7 g/dL (ref 6.0–8.3)

## 2020-03-16 MED FILL — ELIQUIS 5 MG TABLET: 5 | 9 days supply | Qty: 19 | Fill #13

## 2020-03-16 NOTE — Patient Instructions (Signed)
Please go to the lab today and we will call with results. Keep f/up appointments as scheduled. Call sooner if any problems.    Constipation, Adult Constipation is when a person has fewer than three bowel movements in a week, has difficulty having a bowel movement, or has stools (feces) that are dry, hard, or larger than normal. Constipation may be caused by an underlying condition. It may become worse with age if a person takes certain medicines and does not take in enough fluids. Follow these instructions at home: Eating and drinking  Eat foods that have a lot of fiber, such as beans, whole grains, and fresh fruits and vegetables.  Limit foods that are low in fiber and high in fat and processed sugars, such as fried or sweet foods. These include french fries, hamburgers, cookies, candies, and soda.  Drink enough fluid to keep your urine pale yellow.   General instructions  Exercise regularly or as told by your health care provider. Try to do 150 minutes of moderate exercise each week.  Use the bathroom when you have the urge to go. Do not hold it in.  Take over-the-counter and prescription medicines only as told by your health care provider. This includes any fiber supplements.  During bowel movements: ? Practice deep breathing while relaxing the lower abdomen. ? Practice pelvic floor relaxation.  Watch your condition for any changes. Let your health care provider know about them.  Keep all follow-up visits as told by your health care provider. This is important. Contact a health care provider if:  You have pain that gets worse.  You have a fever.  You do not have a bowel movement after 4 days.  You vomit.  You are not hungry or you lose weight.  You are bleeding from the opening between the buttocks (anus).  You have thin, pencil-like stools. Get help right away if:  You have a fever and your symptoms suddenly get worse.  You leak stool or have blood in your  stool.  Your abdomen is bloated.  You have severe pain in your abdomen.  You feel dizzy or you faint. Summary  Constipation is when a person has fewer than three bowel movements in a week, has difficulty having a bowel movement, or has stools (feces) that are dry, hard, or larger than normal.  Eat foods that have a lot of fiber, such as beans, whole grains, and fresh fruits and vegetables.  Drink enough fluid to keep your urine pale yellow.  Take over-the-counter and prescription medicines only as told by your health care provider. This includes any fiber supplements. This information is not intended to replace advice given to you by your health care provider. Make sure you discuss any questions you have with your health care provider. Document Revised: 12/03/2018 Document Reviewed: 12/03/2018 Elsevier Patient Education  [place].

## 2020-03-16 NOTE — Progress Notes (Signed)
Subjective:    Patient ID: [name] Woods, male    DOB: Jul 17, 1940, 80 y.o.   MRN: [id number]  Chief Complaint  Patient presents with  . Hospitalization Follow-up    Constipation     HPI Patient is in today for TCM VISIT.  Admit date - 03/06/20 Discharge date - 03/09/20 Date of contact - 03/11/20  Patient is a 80 year old pleasant male who was admitted for GI bleed.  He had been having 2 days of abdominal cramping, dark stools, and coffee-ground emesis.  He was admitted in the hospital with surgical and GI consult after a CT scan of the abdomen showed ileus and left lower lobe airspace disease.  He did undergo a GI endoscopy during his stay and there was no evidence of bleeding.  It was suspected he had a Mallory-Weiss tear.  The small bowel obstruction was followed by surgery and he was advanced to a soft diet without problems.  He was also treated in the hospital for aspiration pneumonitis versus pneumonia with Unasyn that was transitioned to Augmentin.  He was discharged in stable condition.  His appetite and energy have been back to normal.  He is sleeping well.  Chronic A fib history, stable on Eliquis and Cardizem.   He has been having trouble with constipation since his release from the hospital. He has not tried anything for it yet. Yellow/brown color, denies any bloody or tarry stools. He last had a BM yesterday morning, but states it was "pastey." He is also having gas.   Upcoming appointments: Dr. Fuller Plan, GI on 04/07/20.  Dr. [name], PCP on 04/15/20.   Past Medical History:  Diagnosis Date  . Ankylosing spondylitis ([place])   . Aortic atherosclerosis ([place])   . Arthritis   . Bilateral inguinal hernia   . CAD (coronary artery disease) 9735,3299   30% mid LAD lesion on cardiac cath  . Chicken pox   . Cholelithiasis   . Diverticulosis 11/26/2018   Moderate, Left Noted on Colonoscopy  . Dysrhythmia    atrial fibrillation  . Gallstones   . GERD (gastroesophageal reflux  disease)   . [place] measles   . Hiatal hernia   . History of colon polyps 11/26/2018  . History of kidney stones   . HLD (hyperlipidemia)   . HTN (hypertension)   . Hypertension   . Nephrolithiasis   . Persistent atrial fibrillation ([place])   . Sleep apnea    No CPAP  . 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 Ventricular hypertrophy     Past Surgical History:  Procedure Laterality Date  . BIOPSY  03/08/2020   Procedure: BIOPSY;  Surgeon: Yetta Flock, MD;  Location: [facility] ENDOSCOPY;  Service: Gastroenterology;;  . CARDIAC CATHETERIZATION  03/2012   30% mid LAD lesion otherwise normal cors, LVEF 65%  . CHOLECYSTECTOMY    . COLONOSCOPY  11/26/2018  . CYSTOSCOPY WITH RETROGRADE PYELOGRAM, URETEROSCOPY AND STENT PLACEMENT Right 01/02/2019   Procedure: CYSTOSCOPY WITH RETROGRADE PYELOGRAM, URETEROSCOPY AND STENT PLACEMENT;  Surgeon: Alexis Frock, MD;  Location: [facility] ORS;  Service: Urology;  Laterality: Right;  1 HR  . ELBOW ARTHROPLASTY Left   . ESOPHAGOGASTRODUODENOSCOPY (EGD) WITH PROPOFOL N/A 03/08/2020   Procedure: ESOPHAGOGASTRODUODENOSCOPY (EGD) WITH PROPOFOL;  Surgeon: Yetta Flock, MD;  Location: [facility];  Service: Gastroenterology;  Laterality: N/A;  . HAND SURGERY Left   . HOLMIUM LASER APPLICATION Right 24/03/6832   Procedure: HOLMIUM LASER APPLICATION;  Surgeon: Alexis Frock, MD;  Location: [facility] ORS;  Service: Urology;  Laterality: Right;  . KNEE SURGERY     x2 right  . LEFT AND RIGHT HEART CATHETERIZATION WITH CORONARY ANGIOGRAM N/A 03/24/2012   Procedure: LEFT AND RIGHT HEART CATHETERIZATION WITH CORONARY ANGIOGRAM;  Surgeon: Sherren Mocha, MD;  Location: [facility] C[facility] LAB;  Service: Cardiovascular;  Laterality: N/A;  . LEG FLUID REMOVAL RIGHT    . TONSILLECTOMY    . UPPER GI ENDOSCOPY      Family History  Problem Relation Age of Onset  . Heart disease Father 19       Deceased  . Heart attack Father 48  . Heart disease Brother 61       "Died in his sleep"  .  Arthritis/Rheumatoid Mother        Deceased-86  . Hyperlipidemia Brother   . Stroke Brother 5       Deceased  . Other Sister        Estate agent in [place]  . Other Sister        MVA  . Other Brother        Carjacked-killed  . Dementia Paternal Grandmother   . Cancer Paternal Uncle   . Healthy Brother   . Healthy Son        X3  . Healthy Daughter        x1  . Colon cancer Neg Hx   . Esophageal cancer Neg Hx   . Stomach cancer Neg Hx   . Rectal cancer Neg Hx     Social History   Socioeconomic History  . Marital status: Married    Spouse name: Not on file  . Number of children: 4  . Years of education: Not on file  . Highest education level: Not on file  Occupational History  . 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 Smoking status: Never Smoker  . Smokeless tobacco: Never Used  Vaping Use  . Vaping Use: Never used  Substance and Sexual Activity  . Alcohol use: No  . Drug use: No  . Sexual activity: Yes  Other Topics Concern  . Not on file  Social History Narrative   4 caffeine drinks daily    Social Determinants of Health   Financial Resource Strain: Not on file  Food Insecurity: Not on file  Transportation Needs: Not on file  Physical Activity: Not on file  Stress: Not on file  Social Connections: Not on file  Intimate Partner Violence: Not on file    Outpatient Medications Prior to Visit  Medication Sig Dispense Refill  . allopurinol (ZYLOPRIM) 100 MG tablet TAKE 1 TABLET EVERY DAY (Patient taking differently: Take 100 mg by mouth daily.) 90 tablet 1  . atorvastatin (LIPITOR) 10 MG tablet TAKE 1 TABLET EVERY DAY (Patient taking differently: Take 10 mg by mouth daily.) 90 tablet 1  . diltiazem (CARDIZEM CD) 240 MG 24 hr capsule Take 1 capsule (240 mg total) by mouth daily. Please call to schedule appointment with Dr. Percival Spanish in January 2022 for refills. 90 capsule 0  . furosemide (LASIX) 20 MG tablet Take 1 tablet  (20 mg total) by mouth daily. Please call to schedule appointment with Dr. Percival Spanish in January 2022 for refills. 90 tablet 0  . HYDROcodone-acetaminophen (NORCO) 10-325 MG tablet TAKE 1 TABLET BY MOUTH EVERY 6 (SIX) HOURS AS NEEDED. 90 tablet 0  . metoprolol succinate (TOPROL-XL) 100 MG 24 hr tablet TAKE 1 TABLET  TWO TIMES  DAILY  WITH OR IMMEDIATELY FOLLOWING A MEAL. (Patient taking differently: Take 100 mg by mouth 2 (two) times daily. TAKE WITH OR IMMEDIATELY FOLLOWING A MEAL.) 180 tablet 3  . pantoprazole (PROTONIX) 40 MG tablet Take 1 tablet (40 mg total) by mouth daily. 30 tablet 0  . sildenafil (REVATIO) 20 MG tablet TAKE 1 TABLET BY MOUTH AS NEEDED FOR ERECTILE DYSFUNCTION. DO NO TAKE MORE THAN 1 DOSE IN 36 HOURS. (Patient taking differently: Take 20 mg by mouth daily as needed (erectile dysfunction).) 30 tablet 0  . ELIQUIS 5 MG TABS tablet TAKE 1 TABLET BY MOUTH 2 TIMES DAILY. (Patient taking differently: Take 5 mg by mouth 2 (two) times daily.) 180 tablet 1   No facility-administered medications prior to visit.    Allergies  Allergen Reactions  . Coumadin [Warfarin] Other (See Comments)    GI bleeds    Review of Systems  Constitutional: Negative for activity change, appetite change and unexpected weight change.  HENT: Negative for congestion.   Eyes: Negative for visual disturbance.  Respiratory: Negative for apnea and shortness of breath.   Cardiovascular: Negative for chest pain.  Gastrointestinal: Positive for constipation. Negative for abdominal pain, anal bleeding, blood in stool, diarrhea, nausea, rectal pain and vomiting.  Endocrine: Negative for polydipsia, polyphagia and polyuria.  Genitourinary: Negative for difficulty urinating.  Musculoskeletal: Negative for arthralgias.  Skin: Negative for rash.  Neurological: Negative for seizures, weakness and headaches.  Psychiatric/Behavioral: Negative for sleep disturbance and suicidal ideas.       Objective:    Physical  Exam Vitals and nursing note reviewed.  Constitutional:      General: He is not in acute distress.    Appearance: Normal appearance. He is not toxic-appearing.  HENT:     Head: Normocephalic and atraumatic.     Right Ear: Tympanic membrane, ear canal and external ear normal.     Left Ear: Tympanic membrane, ear canal and external ear normal.     Nose: Nose normal.     Mouth/Throat:     Mouth: Mucous membranes are moist.     Pharynx: Oropharynx is clear.  Eyes:     Extraocular Movements: Extraocular movements intact.     Conjunctiva/sclera: Conjunctivae normal.     Pupils: Pupils are equal, round, and reactive to light.  Cardiovascular:     Rate and Rhythm: Normal rate and regular rhythm.     Pulses: Normal pulses.     Heart sounds: Normal heart sounds.  Pulmonary:     Effort: Pulmonary effort is normal.     Breath sounds: Normal breath sounds.  Abdominal:     General: Abdomen is flat. Bowel sounds are normal.     Palpations: Abdomen is soft.     Tenderness: There is no abdominal tenderness.  Musculoskeletal:        General: Normal range of motion.     Cervical back: Normal range of motion and neck supple.  Skin:    General: Skin is warm and dry.  Neurological:     General: No focal deficit present.     Mental Status: He is alert and oriented to person, place, and time.  Psychiatric:        Mood and Affect: Mood normal.        Behavior: Behavior normal.     BP 131/76   Pulse 65   Temp 97.6 F (36.4 C)   Ht 6' (1.829 m)   Wt 203 lb 8 oz (92.3 kg)  SpO2 98%   BMI 27.60 kg/m  Wt Readings from Last 3 Encounters:  03/16/20 203 lb 8 oz (92.3 kg)  03/08/20 199 lb (90.3 kg)  02/18/20 213 lb 3.2 oz (96.7 kg)    Lab Results  Component Value Date   TSH 2.291 03/06/2020   Lab Results  Component Value Date   WBC 8.1 03/09/2020   HGB 14.9 03/09/2020   HCT 45.7 03/09/2020   MCV 89.3 03/09/2020   PLT 162 03/09/2020   Lab Results  Component Value Date   NA 142  03/09/2020   K 3.3 (L) 03/09/2020   CO2 26 03/09/2020   GLUCOSE 108 (H) 03/09/2020   BUN 8 03/09/2020   CREATININE 1.06 03/09/2020   BILITOT 2.4 (H) 03/07/2020   ALKPHOS 64 03/07/2020   AST 18 03/07/2020   ALT 9 03/07/2020   PROT 5.8 (L) 03/07/2020   ALBUMIN 3.1 (L) 03/07/2020   CALCIUM 8.6 (L) 03/09/2020   ANIONGAP 9 03/09/2020   GFR 59.17 (L) 10/23/2018   Lab Results  Component Value Date   CHOL 115 02/18/2020   Lab Results  Component Value Date   HDL 39 (L) 02/18/2020   Lab Results  Component Value Date   LDLCALC 58 02/18/2020   Lab Results  Component Value Date   TRIG 94 02/18/2020   Lab Results  Component Value Date   CHOLHDL 2.9 02/18/2020   Lab Results  Component Value Date   HGBA1C 5.9 (H) 03/07/2020       Assessment & Plan:   Problem List Items Addressed This Visit      Cardiovascular and Mediastinum   Essential hypertension   Relevant Orders   CBC with Differential/Platelet   Comprehensive metabolic panel   Chronic atrial fibrillation (HCC)     Respiratory   Aspiration pneumonia ([place])   Relevant Orders   CBC with Differential/Platelet   Comprehensive metabolic panel     Digestive   GI bleed - Primary   Relevant Orders   CBC with Differential/Platelet   Comprehensive metabolic panel   Partial small bowel obstruction (HCC)   Relevant Orders   CBC with Differential/Platelet   Comprehensive metabolic panel    Other Visit Diagnoses    Slow transit constipation           No orders of the defined types were placed in this encounter.  1. Gastrointestinal hemorrhage with hematemesis  This has resolved.  He denies any more issues.  2. Partial small bowel obstruction ([place]) He is going to follow-up with GI.  He does not have any more abdominal pain.  His appetite has been back to normal.  3. Essential hypertension His blood pressure looks good today.  He will continue to monitor at home.  Low-salt diet recommended.  He is taking  diltiazem 240 mg once daily and Toprol-XL 100 mg once daily.  4. Chronic atrial fibrillation (HCC) Again stable.  He has resumed his Eliquis 5 mg one tab twice daily.  No further bleeding issues.  5. Aspiration pneumonia of left lower lobe due to vomit [facility]) Discussed with patient that he may want to repeat a chest x-ray and assure that this is resolved.  Usually advised to repeat in about 1 month which would be about the time of his appointment with Dr. [name].  He will discuss with him then.  6. Slow transit constipation His stools are not quite back to normal regimen.  I recommended increase fiber, walking, and water intake daily. He may  try OTC Miralax once daily, but understands he needs to increase fluid intake with this. Goal for him is about 80-100 oz water daily.  This visit occurred during the SARS-CoV-2 public health emergency.  Safety protocols were in place, including screening questions prior to the visit, additional usage of staff PPE, and extensive cleaning of exam room while observing appropriate contact time as indicated for disinfecting solutions.     Rodgers Likes M Elianny Buxbaum, PA-C

## 2020-03-31 ENCOUNTER — Other Ambulatory Visit: Payer: Self-pay | Admitting: Cardiology

## 2020-04-05 MED FILL — ELIQUIS 5 MG TABLET: 5 | 5 days supply | Qty: 10 | Fill #0

## 2020-04-07 ENCOUNTER — Encounter: Payer: Self-pay | Admitting: Gastroenterology

## 2020-04-07 ENCOUNTER — Ambulatory Visit (INDEPENDENT_AMBULATORY_CARE_PROVIDER_SITE_OTHER): Payer: Medicare HMO | Admitting: Gastroenterology

## 2020-04-07 VITALS — BP 154/68 | HR 52 | Ht 72.0 in | Wt 201.2 lb

## 2020-04-07 DIAGNOSIS — K56600 Partial intestinal obstruction, unspecified as to cause: Secondary | ICD-10-CM | POA: Diagnosis not present

## 2020-04-07 DIAGNOSIS — Z86010 Personal history of colonic polyps: Secondary | ICD-10-CM | POA: Diagnosis not present

## 2020-04-07 DIAGNOSIS — K5900 Constipation, unspecified: Secondary | ICD-10-CM | POA: Diagnosis not present

## 2020-04-07 DIAGNOSIS — J439 Emphysema, unspecified: Secondary | ICD-10-CM | POA: Diagnosis not present

## 2020-04-07 NOTE — Patient Instructions (Signed)
Ongoing follow up with your primary care physician.   Thank you for choosing me and [facility].  Pricilla Riffle. Dagoberto Ligas., MD., [facility]

## 2020-04-07 NOTE — Progress Notes (Signed)
    History of Present Illness: This is a 80 year old male returning for follow-up after hospitalization for a partial SBO, coffee ground emesis and dark stools.  Stool on admission was Hemoccult negative.  EGD performed during hospitalization was unremarkable for a source however a Mallory-Weiss tear that had healed was suspected.  He was treated for aspiration pneumonia with Unasyn which was transitioned to Augmentin.  He has had mild constipation since discharge.  No other GI complaints.  EGD 03/2020 - Normal esophagus otherwise aside of NG tube trauma - mild - A single subepithelial nodule was found in the stomach, benign appearing. Biopsied. - A few gastric polyps. Resected and retrieved. - Normal stomach otherwise aside from NG tube trauma - mild - Duodenal diverticulum. - Normal duodenum otherwise   Colonoscopy 10/2018 One 8 mm polyp in the cecum, removed with a hot snare. Resected and retrieved. - Two 6 to 7 mm polyps in the descending colon and in the ascending colon, removed with a cold snare. Resected and retrieved. - One 3 mm polyp in the descending colon, removed with a cold biopsy forceps. Resected and retrieved. - Two non-bleeding colonic angiodysplastic lesions. - Moderate diverticulosis in the left colon. - The examination was otherwise normal on direct and retroflexion views.  Current Medications, Allergies, Past Medical History, Past Surgical History, Family History and Social History were reviewed in [employer] record.   Physical Exam: General: Well developed, well nourished, no acute distress Head: Normocephalic and atraumatic Eyes: Sclerae anicteric, EOMI Ears: Normal auditory acuity Mouth: Not examined, mask on during Covid-19 pandemic Lungs: Clear throughout to auscultation Heart: Regular rate and rhythm; no murmurs, rubs or bruits Abdomen: Soft, non tender and non distended. No masses, hepatosplenomegaly or hernias noted. Normal Bowel  sounds Rectal: Not done Musculoskeletal: Symmetrical with no gross deformities  Pulses:  Normal pulses noted Extremities: No clubbing, cyanosis, edema or deformities noted Neurological: Alert oriented x 4, grossly nonfocal Psychological:  Alert and cooperative. Normal mood and affect   Assessment and Recommendations:  1.  Partial small bowel obstruction likely due to adhesions, resolved.  Follow-up with general surgery for recurrence.  Return to PCP for ongoing care.  2.  Possible GI bleeding with coffee-ground emesis and dark stools however stool was Hemoccult negative. EGD did not reveal a source.  He has a history colonic AVMs. Discontinue pantoprazole.  Return to PCP for ongoing care.  3.  Mild constipation.  Begin MiraLAX daily as needed.  Return to PCP for ongoing care.  4.  Personal history of adenomatous colon polyps.  No future surveillance colonoscopies planned due to age and comorbidities.  5. Chronic atrial fibrillation maintained on Eliquis.

## 2020-04-15 ENCOUNTER — Other Ambulatory Visit: Payer: Self-pay

## 2020-04-15 ENCOUNTER — Other Ambulatory Visit: Payer: Self-pay | Admitting: Family Medicine

## 2020-04-15 ENCOUNTER — Encounter: Payer: Self-pay | Admitting: Family Medicine

## 2020-04-15 ENCOUNTER — Ambulatory Visit (INDEPENDENT_AMBULATORY_CARE_PROVIDER_SITE_OTHER): Payer: Medicare HMO | Admitting: Family Medicine

## 2020-04-15 VITALS — BP 149/84 | HR 63 | Temp 98.0°F | Ht 72.0 in | Wt 200.0 lb

## 2020-04-15 DIAGNOSIS — M1990 Unspecified osteoarthritis, unspecified site: Secondary | ICD-10-CM

## 2020-04-15 DIAGNOSIS — K219 Gastro-esophageal reflux disease without esophagitis: Secondary | ICD-10-CM

## 2020-04-15 DIAGNOSIS — I10 Essential (primary) hypertension: Secondary | ICD-10-CM

## 2020-04-15 DIAGNOSIS — I482 Chronic atrial fibrillation, unspecified: Secondary | ICD-10-CM

## 2020-04-15 DIAGNOSIS — I2510 Atherosclerotic heart disease of native coronary artery without angina pectoris: Secondary | ICD-10-CM | POA: Diagnosis not present

## 2020-04-15 DIAGNOSIS — E785 Hyperlipidemia, unspecified: Secondary | ICD-10-CM

## 2020-04-15 DIAGNOSIS — M109 Gout, unspecified: Secondary | ICD-10-CM | POA: Diagnosis not present

## 2020-04-15 DIAGNOSIS — M459 Ankylosing spondylitis of unspecified sites in spine: Secondary | ICD-10-CM | POA: Diagnosis not present

## 2020-04-15 LAB — CBC
HCT: 49.9 % (ref 39.0–52.0)
Hemoglobin: 16.7 g/dL (ref 13.0–17.0)
MCHC: 33.5 g/dL (ref 30.0–36.0)
MCV: 88.7 fl (ref 78.0–100.0)
Platelets: 201 10*3/uL (ref 150.0–400.0)
RBC: 5.63 Mil/uL (ref 4.22–5.81)
RDW: 14.8 % (ref 11.5–15.5)
WBC: 8.1 10*3/uL (ref 4.0–10.5)

## 2020-04-15 LAB — COMPREHENSIVE METABOLIC PANEL
ALT: 14 U/L (ref 0–53)
AST: 23 U/L (ref 0–37)
Albumin: 4.1 g/dL (ref 3.5–5.2)
Alkaline Phosphatase: 87 U/L (ref 39–117)
BUN: 15 mg/dL (ref 6–23)
CO2: 27 mEq/L (ref 19–32)
Calcium: 9.7 mg/dL (ref 8.4–10.5)
Chloride: 106 mEq/L (ref 96–112)
Creatinine, Ser: 1.27 mg/dL (ref 0.40–1.50)
GFR: 53.86 mL/min — ABNORMAL LOW (ref >60.00)
Glucose, Bld: 101 mg/dL — ABNORMAL HIGH (ref 70–99)
Potassium: 4.5 mEq/L (ref 3.5–5.1)
Sodium: 145 mEq/L (ref 135–145)
Total Bilirubin: 2.3 mg/dL — ABNORMAL HIGH (ref 0.2–1.2)
Total Protein: 6.6 g/dL (ref 6.0–8.3)

## 2020-04-15 LAB — TSH: TSH: 2.45 u[IU]/mL (ref 0.35–4.50)

## 2020-04-15 MED ORDER — HYDROCODONE-ACETAMINOPHEN 10-325 MG PO TABS: 1.0000 | ORAL_TABLET | Freq: Four times a day (QID) | ORAL | 0 refills | Status: DC | PRN

## 2020-04-15 NOTE — Assessment & Plan Note (Signed)
At goal per JNC 8.  He is on diltiazem to 40 mg daily and metoprolol succinate 100 mg twice daily.

## 2020-04-15 NOTE — Assessment & Plan Note (Signed)
Follows with cardiology.  He is anticoagulated on Eliquis and rate controlled with diltiazem and metoprolol succinate.  Seems to be in sinus rhythm today.

## 2020-04-15 NOTE — Progress Notes (Signed)
   [name] Woods is a 80 y.o. male who presents today for an office visit.  Assessment/Plan:  New/Acute Problems: Fatigue Likely secondary to recent hospitalization I will recheck labs today including CBC, CMET, TSH.  Chronic Problems Addressed Today: Gout On allopurinol 100 mg daily.  No recent flares.  We will continue current dose.  Essential hypertension At goal per J[facility] 8.  He is on diltiazem to 40 mg daily and metoprolol succinate 100 mg twice daily.  GERD (gastroesophageal reflux disease) Takes Tums as needed.  Has been on PPI in the past but no longer feels like it is needed.  CAD (coronary artery disease) Following with cardiology.  On statin.  Osteoarthritis Severe arthritis.  He is on chronic Norco for this.  Database was reviewed without red flags.  Refill sent in today.  Medications help with ability to stay functional.  Minimal side effects.  Follow-up in 3 months.  Chronic atrial fibrillation ([place]) Follows with cardiology.  He is anticoagulated on Eliquis and rate controlled with diltiazem and metoprolol succinate.  Seems to be in sinus rhythm today.  Hyperlipidemia On statin.  Will need to check lipids again soon.     Subjective:  HPI:  Patient is here to transfer care to this office.  Previous PCP moved to a different office.  He unfortunately was hospitalized with concern for aspiration pneumonia and possible partial small bowel obstruction about a month ago.  He is still having some lingering weakness and lethargy but is otherwise doing well.  See A/P for status of chronic conditions.       Objective:  Physical Exam: BP (!) 149/84   Pulse 63   Temp 98 F (36.7 C)   Ht 6' (1.829 m)   Wt 200 lb (90.7 kg)   SpO2 95%   BMI 27.12 kg/m   Gen: No acute distress, resting comfortably CV: Regular rate and rhythm with no murmurs appreciated Pulm: Normal work of breathing, clear to auscultation bilaterally with no crackles, wheezes, or rhonchi Neuro:  Grossly normal, moves all extremities Psych: Normal affect and thought content  Time Spent: 45 minutes of total time was spent on the date of the encounter performing the following actions: chart review prior to seeing the patient including recent hospitalization and previous visits with previous PCP, obtaining history, performing a medically necessary exam, counseling on the treatment plan, placing orders, and documenting in our EHR.        Algis Greenhouse. [name], MD 04/15/2020 11:25 AM

## 2020-04-15 NOTE — Patient Instructions (Signed)
It was very nice to see you today!  We will check blood work today.  I will refill your medication.  I will see back in 3 months.  Come back to see me sooner if needed.  Take care, Dr [name]  PLEASE NOTE:  If you had any lab tests please let us know if you have not heard back within a few days. You may see your results on mychart before we have a chance to review them but we will give you a call once they are reviewed by [place]. If we ordered any referrals today, please let us know if you have not heard from their office within the next week.   Please try these tips to maintain a healthy lifestyle:   Eat at least 3 REAL meals and 1-2 snacks per day.  Aim for no more than 5 hours between eating.  If you eat breakfast, please do so within one hour of getting up.    Each meal should contain half fruits/vegetables, one quarter protein, and one quarter carbs (no bigger than a computer mouse)   Cut down on sweet beverages. This includes juice, soda, and sweet tea.     Drink at least 1 glass of water with each meal and aim for at least 8 glasses per day   Exercise at least 150 minutes every week.

## 2020-04-15 NOTE — Assessment & Plan Note (Signed)
Takes Tums as needed.  Has been on PPI in the past but no longer feels like it is needed.

## 2020-04-15 NOTE — Assessment & Plan Note (Signed)
Severe arthritis.  He is on chronic Norco for this.  Database was reviewed without red flags.  Refill sent in today.  Medications help with ability to stay functional.  Minimal side effects.  Follow-up in 3 months.

## 2020-04-15 NOTE — Assessment & Plan Note (Signed)
On allopurinol 100 mg daily.  No recent flares.  We will continue current dose.

## 2020-04-15 NOTE — Assessment & Plan Note (Signed)
Following with cardiology.  On statin.

## 2020-04-15 NOTE — Assessment & Plan Note (Signed)
On statin.  Will need to check lipids again soon.

## 2020-04-18 ENCOUNTER — Other Ambulatory Visit: Payer: Self-pay | Admitting: Cardiology

## 2020-04-18 NOTE — Progress Notes (Signed)
Please inform patient of the following:  Labs are all stable. Would like for him to give it a few more weeks and let us know if his fatigue is not improving.  [name] Woods. [name], MD 04/18/2020 8:46 AM

## 2020-04-20 ENCOUNTER — Other Ambulatory Visit: Payer: Self-pay | Admitting: *Deleted

## 2020-04-20 NOTE — Telephone Encounter (Signed)
[facility] requesting refills  Last prescription done by [facility]

## 2020-04-21 ENCOUNTER — Other Ambulatory Visit: Payer: Self-pay | Admitting: Family Medicine

## 2020-04-21 MED ORDER — ALLOPURINOL 100 MG PO TABS
100.0000 mg | ORAL_TABLET | Freq: Every day | ORAL | 1 refills | Status: DC
Start: 2020-04-21 — End: 2020-04-21

## 2020-04-25 MED FILL — ALLOPURINOL 100 MG TABS: 100 | 90 days supply | Qty: 90 | Fill #0

## 2020-04-26 ENCOUNTER — Other Ambulatory Visit: Payer: Self-pay | Admitting: *Deleted

## 2020-04-26 NOTE — Telephone Encounter (Signed)
LAST APPOINTMENT DATE: 04/20/2020   NEXT APPOINTMENT DATE: Visit date not found    Last done by Ambrose Pancoast MD

## 2020-04-27 MED ORDER — DILTIAZEM HCL ER COATED BEADS 240 MG PO CP24: ORAL_CAPSULE | ORAL | 0 refills | Status: DC

## 2020-04-28 MED FILL — ELIQUIS 5 MG TABLET: 5 | 7 days supply | Qty: 15 | Fill #2

## 2020-05-13 ENCOUNTER — Other Ambulatory Visit (HOSPITAL_COMMUNITY): Payer: Self-pay

## 2020-05-13 ENCOUNTER — Other Ambulatory Visit: Payer: Self-pay | Admitting: Family Medicine

## 2020-05-13 DIAGNOSIS — M459 Ankylosing spondylitis of unspecified sites in spine: Secondary | ICD-10-CM

## 2020-05-13 MED FILL — Apixaban Tab 5 MG: ORAL | 30 days supply | Qty: 60 | Fill #0 | Status: AC

## 2020-05-16 ENCOUNTER — Other Ambulatory Visit: Payer: Self-pay | Admitting: Family Medicine

## 2020-05-16 ENCOUNTER — Other Ambulatory Visit (HOSPITAL_COMMUNITY): Payer: Self-pay

## 2020-05-16 DIAGNOSIS — M459 Ankylosing spondylitis of unspecified sites in spine: Secondary | ICD-10-CM

## 2020-05-16 MED ORDER — HYDROCODONE-ACETAMINOPHEN 10-325 MG PO TABS
1.0000 | ORAL_TABLET | Freq: Four times a day (QID) | ORAL | 0 refills | Status: DC | PRN
  Filled 2020-05-16: qty 90, 23d supply, fill #0

## 2020-05-17 ENCOUNTER — Other Ambulatory Visit (HOSPITAL_COMMUNITY): Payer: Self-pay

## 2020-05-24 ENCOUNTER — Encounter: Payer: Self-pay | Admitting: Family Medicine

## 2020-05-24 ENCOUNTER — Other Ambulatory Visit: Payer: Self-pay

## 2020-05-24 ENCOUNTER — Ambulatory Visit (INDEPENDENT_AMBULATORY_CARE_PROVIDER_SITE_OTHER): Payer: Medicare HMO | Admitting: Family Medicine

## 2020-05-24 DIAGNOSIS — M1711 Unilateral primary osteoarthritis, right knee: Secondary | ICD-10-CM | POA: Diagnosis not present

## 2020-05-24 DIAGNOSIS — M1712 Unilateral primary osteoarthritis, left knee: Secondary | ICD-10-CM | POA: Diagnosis not present

## 2020-05-24 NOTE — Progress Notes (Signed)
I saw and examined the patient with Dr. Elouise Munroe and agree with assessment and plan as outlined.    Longstanding right greater than left knee pain due to end-stage DJD with multiple loose bodies.  Recently feeling a lot more mechanical symptoms in his right knee.  He is still tolerating the pain, would like to avoid knee replacement for a few more years if possible.  He tried an over-the-counter brace but it did not help.  He wonders whether there is another type of brace that might help him.  Both knees injected today.  We will request custom OA brace for the right knee.

## 2020-05-24 NOTE — Progress Notes (Signed)
Office Visit Note   Patient: [name] Woods           Date of Birth: 1940/11/13           MRN: [id number] Visit Date: 05/24/2020 Requested by: Vivi Barrack, MD [address] [place],  [place] [address] PCP: Vivi Barrack, MD  Subjective: Chief Complaint  Patient presents with  . Right Knee - Pain    The patient was helping his wife get into bed 1 week ago (recovering from surgery) - he felt like the whole knee just moved to the side. The knee is crunchy now - "it's like a ratchet now."  . Left Knee - Pain    The knees have both hurt for a long time - "bone-to-bone."    HPI: 80yo M with PMHx of Knee OA, AnkS, presenting to clinic with acute on chronic right knee pain, as well as his chronic left knee pain. Patient states that his wife recently had surgery, and he was helping her with her surgical aftercare, when he felt his right knee shift inward, accompanied by sudden pain. Patient says that since this time, his knee has felt like it has to 'ratchet' to get into extension, with multiple 'catches' along his movement arc.               ROS:   All other systems were reviewed and are negative.  Objective: Vital Signs: There were no vitals taken for this visit.  Physical Exam:  General:  Alert and oriented, in no acute distress. Pulm:  Breathing unlabored. Psy:  Normal mood, congruent affect. Skin:  Bilateral knees with no bruising, rashes, or erythema. Overlying skin intact.   Bilateral knee Exam:  General: Normal gait Standing exam: No varus or valgus deformity of the knee.  Seated Exam:  Severe patellar crepitus bilaterally. Extension reduced bilaterally, about 20* short of full extension on right, 10* on left. Flexion restricted to 90* bilaterally.   Palpation: Endorses tenderness to palpation over bilateral medial joint lines, R>L. Tenderness with palpation of medial patellar facet on right. No significant lateral joint line tenderness.   Supine exam: Small effusion on  right, reduced patellar mobility.   Ligamentous Exam:  No pain or laxity with anterior/posterior drawer.  No obvious Sag.  No pain though does displace pseudolaxity with varus/valgus stress across the knee.   Meniscus:  McMurray with pain, as well as numerous deep clicking appreciated, R>L  Strength: Hip flexion (L1), Hip Aduction (L2), Knee Extension (L3) are 5/5 Bilaterally Foot Inversion (L4), Dorsiflexion (L5), and Eversion (S1) 5/5 Bilaterally  Sensation: Intact to light touch medial and lateral aspects of lower extremities, and lateral, dorsal, and medial aspects of foot.    Imaging: No results found.  Assessment & Plan: 80yo M presenting to clinic with concerns of acute on chronic right knee pain, as well as chronic left knee pain. Patient with history of severe OA- suspect degenerative meniscal injury causing his worsening symptoms.  - Patient would like to try injection therapy today, and requests bilateral corticosteroids. Risks and benefits discussed, and patient opted to proceed.  - Injection therapy performed as described below, which patient tolerated very well. - Will place referral for offloading brace in the hopes of improving his symptoms - If no benefit, patient may be a candidate for knee replacement given his significantly advanced arthritis, but optimistic that he can return to his previous baseline with CSI.  - Patient had no further questions or concerns today.  Procedures: Bilateral knee Cortisone Injection:  Risks and benefits of procedure discussed, Patient opted to proceed. Verbal Consent obtained.  Timeout performed.  Skin prepped in a sterile fashion with betadine before further cleansing with alcohol. Ethyl Chloride was used for topical analgesia.  Right knee was injected with 3cc 0.25% bupivacaine without epinephrine via the suprapatellar approach using a 25G, 1.5in needle. Syringe was removed from the needle, and 40mg  methylprednisolone was then  injected into the joint.  Procedure was then repeated on the left knee.  Patient tolerated the injection well with no immediate complications. Aftercare instructions were discussed, and patient was given strict return precautions.     PMFS History: Patient Active Problem List   Diagnosis Date Noted  . Osteoarthritis 04/15/2020  . Gastric nodule   . OSA (obstructive sleep apnea) 03/06/2020  . Chronic atrial fibrillation ([place]) 02/12/2019  . Kidney stones, calcium oxalate 12/03/2018  . Hyperlipidemia 02/06/2018  . Encounter for chronic pain management 03/01/2017  . Hyperglycemia 04/23/2016  . Chronic anticoagulation 03/02/2015  . Gout 06/30/2014  . CAD (coronary artery disease) 03/21/2012  . GERD (gastroesophageal reflux disease) 03/21/2012  . Essential hypertension 04/25/2007   Past Medical History:  Diagnosis Date  . Ankylosing spondylitis ([place])   . Anxiety and depression 09/12/2015  . Aortic atherosclerosis ([place])   . Arthritis   . Bilateral inguinal hernia   . CAD (coronary artery disease) 3220,2542   30% mid LAD lesion on cardiac cath  . Chicken pox   . Cholelithiasis   . CHOLELITHIASIS, HX OF 04/25/2007   Qualifier: Diagnosis of  By: Marland Mcalpine    . Diverticulosis 11/26/2018   Moderate, Left Noted on Colonoscopy  . Dysrhythmia    atrial fibrillation  . Gallstones   . GERD (gastroesophageal reflux disease)   . [place] measles   . Hiatal hernia   . History of colon polyps 11/26/2018  . History of kidney stones   . HLD (hyperlipidemia)   . HTN (hypertension)   . Hypertension   . Nephrolithiasis   . NEPHROLITHIASIS, HX OF 04/25/2007   Qualifier: Diagnosis of  By: Marland Mcalpine    . Partial small bowel obstruction ([place]) 03/06/2020  . Persistent atrial fibrillation ([place])   . Sleep apnea    No CPAP  . 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 Ventricular hypertrophy     Family History  Problem Relation Age of Onset  . Heart disease Father 57       Deceased   . Heart attack Father 75  . Heart disease Brother 82       "Died in his sleep"  . Arthritis/Rheumatoid Mother        Deceased-86  . Hyperlipidemia Brother   . Stroke Brother 74       Deceased  . Other Sister        Estate agent in [place]  . Other Sister        MVA  . Other Brother        Carjacked-killed  . Dementia Paternal Grandmother   . Cancer Paternal Uncle   . Healthy Brother   . Healthy Son        X3  . Healthy Daughter        x1  . Colon cancer Neg Hx   . Esophageal cancer Neg Hx   . Stomach cancer Neg Hx   . Rectal cancer Neg Hx     Past Surgical History:  Procedure Laterality Date  . BIOPSY  03/08/2020   Procedure: BIOPSY;  Surgeon: Yetta Flock, MD;  Location: [facility] ENDOSCOPY;  Service: Gastroenterology;;  . CARDIAC CATHETERIZATION  03/2012   30% mid LAD lesion otherwise normal cors, LVEF 65%  . CHOLECYSTECTOMY    . COLONOSCOPY  11/26/2018  . CYSTOSCOPY WITH RETROGRADE PYELOGRAM, URETEROSCOPY AND STENT PLACEMENT Right 01/02/2019   Procedure: CYSTOSCOPY WITH RETROGRADE PYELOGRAM, URETEROSCOPY AND STENT PLACEMENT;  Surgeon: Alexis Frock, MD;  Location: [facility];  Service: Urology;  Laterality: Right;  1 HR  . ELBOW ARTHROPLASTY Left   . ESOPHAGOGASTRODUODENOSCOPY (EGD) WITH PROPOFOL N/A 03/08/2020   Procedure: ESOPHAGOGASTRODUODENOSCOPY (EGD) WITH PROPOFOL;  Surgeon: Yetta Flock, MD;  Location: [facility];  Service: Gastroenterology;  Laterality: N/A;  . HAND SURGERY Left   . HOLMIUM LASER APPLICATION Right 23/05/5730   Procedure: HOLMIUM LASER APPLICATION;  Surgeon: Alexis Frock, MD;  Location: [facility];  Service: Urology;  Laterality: Right;  . KNEE SURGERY     x2 right  . LEFT AND RIGHT HEART CATHETERIZATION WITH CORONARY ANGIOGRAM N/A 03/24/2012   Procedure: LEFT AND RIGHT HEART CATHETERIZATION WITH CORONARY ANGIOGRAM;  Surgeon: Sherren Mocha, MD;  Location: [facility] CATH LAB;  Service: Cardiovascular;  Laterality: N/A;  . LEG FLUID REMOVAL RIGHT    .  TONSILLECTOMY    . UPPER GI ENDOSCOPY     Social History   Occupational History  . 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 Smoking status: Never Smoker  . Smokeless tobacco: Never Used  Vaping Use  . Vaping Use: Never used  Substance and Sexual Activity  . Alcohol use: No  . Drug use: No  . Sexual activity: Yes

## 2020-05-25 NOTE — Progress Notes (Signed)
Emailed order, demographics and ov notes to [place]. He confirmed that he received it.

## 2020-06-02 ENCOUNTER — Other Ambulatory Visit: Payer: Self-pay | Admitting: Cardiology

## 2020-06-02 ENCOUNTER — Other Ambulatory Visit (HOSPITAL_COMMUNITY): Payer: Self-pay

## 2020-06-02 NOTE — Telephone Encounter (Signed)
Rx(s) sent to pharmacy electronically.  

## 2020-06-13 ENCOUNTER — Other Ambulatory Visit: Payer: Self-pay | Admitting: Family Medicine

## 2020-06-13 ENCOUNTER — Other Ambulatory Visit (HOSPITAL_COMMUNITY): Payer: Self-pay

## 2020-06-13 DIAGNOSIS — M459 Ankylosing spondylitis of unspecified sites in spine: Secondary | ICD-10-CM

## 2020-06-14 ENCOUNTER — Other Ambulatory Visit (HOSPITAL_COMMUNITY): Payer: Self-pay

## 2020-06-14 MED ORDER — HYDROCODONE-ACETAMINOPHEN 10-325 MG PO TABS
1.0000 | ORAL_TABLET | Freq: Four times a day (QID) | ORAL | 0 refills | Status: DC | PRN
  Filled 2020-06-14: qty 90, 23d supply, fill #0

## 2020-06-15 ENCOUNTER — Other Ambulatory Visit: Payer: Self-pay | Admitting: *Deleted

## 2020-06-15 MED ORDER — ALLOPURINOL 100 MG PO TABS: ORAL_TABLET | Freq: Every day | ORAL | 1 refills | Status: DC

## 2020-06-15 MED ORDER — ATORVASTATIN CALCIUM 10 MG PO TABS: 1.0000 | ORAL_TABLET | Freq: Every day | ORAL | 1 refills | Status: DC

## 2020-06-30 ENCOUNTER — Other Ambulatory Visit (HOSPITAL_COMMUNITY): Payer: Self-pay

## 2020-06-30 MED FILL — Apixaban Tab 5 MG: ORAL | 7 days supply | Qty: 15 | Fill #1 | Status: AC

## 2020-07-13 ENCOUNTER — Other Ambulatory Visit: Payer: Self-pay | Admitting: Family Medicine

## 2020-07-13 ENCOUNTER — Other Ambulatory Visit (HOSPITAL_COMMUNITY): Payer: Self-pay

## 2020-07-13 DIAGNOSIS — M459 Ankylosing spondylitis of unspecified sites in spine: Secondary | ICD-10-CM

## 2020-07-13 MED FILL — Apixaban Tab 5 MG: ORAL | 7 days supply | Qty: 15 | Fill #2 | Status: AC

## 2020-07-14 ENCOUNTER — Other Ambulatory Visit: Payer: Self-pay

## 2020-07-14 ENCOUNTER — Ambulatory Visit (INDEPENDENT_AMBULATORY_CARE_PROVIDER_SITE_OTHER): Payer: Medicare HMO | Admitting: Family

## 2020-07-14 ENCOUNTER — Encounter: Payer: Self-pay | Admitting: Family

## 2020-07-14 ENCOUNTER — Other Ambulatory Visit (HOSPITAL_COMMUNITY): Payer: Self-pay

## 2020-07-14 VITALS — BP 128/66 | HR 48 | Temp 97.5°F | Ht 72.0 in | Wt 198.6 lb

## 2020-07-14 DIAGNOSIS — B351 Tinea unguium: Secondary | ICD-10-CM

## 2020-07-14 DIAGNOSIS — L819 Disorder of pigmentation, unspecified: Secondary | ICD-10-CM

## 2020-07-14 MED ORDER — HYDROCODONE-ACETAMINOPHEN 10-325 MG PO TABS
1.0000 | ORAL_TABLET | Freq: Four times a day (QID) | ORAL | 0 refills | Status: DC | PRN
  Filled 2020-07-14: qty 90, 23d supply, fill #0

## 2020-07-14 MED ORDER — CICLOPIROX 8 % EX SOLN
Freq: Every day | CUTANEOUS | 1 refills | Status: DC
  Filled 2020-07-14: qty 6.6, 30d supply, fill #0

## 2020-07-14 NOTE — Patient Instructions (Signed)
Fungal Nail Infection A fungal nail infection is a common infection of the toenails or fingernails. This condition affects toenails more often than fingernails. It often affects the great, or big, toes. More than one nail may be infected. The condition can be passed from person to person (is contagious). What are the causes? This condition is caused by a fungus. Several types of fungi can cause the infection. These fungi are common in moist and warm areas. If your hands or feet come into contact with the fungus, it may get into a crack in yourfingernail or toenail and cause the infection. What increases the risk? The following factors may make you more likely to develop this condition: Being male. Being of older age. Living with someone who has the fungus. Walking barefoot in areas where the fungus thrives, such as showers or locker rooms. Wearing shoes and socks that cause your feet to sweat. Having a nail injury or a recent nail surgery. Having certain medical conditions, such as: Athlete's foot. Diabetes. Psoriasis. Poor circulation. A weak body defense system (immune system). What are the signs or symptoms? Symptoms of this condition include: A pale spot on the nail. Thickening of the nail. A nail that becomes yellow or brown. A brittle or ragged nail edge. A crumbling nail. A nail that has lifted away from the nail bed. How is this diagnosed? This condition is diagnosed with a physical exam. Your health care provider maytake a scraping or clipping from your nail to test for the fungus. How is this treated? Treatment is not needed for mild infections. If you have significant nail changes, treatment may include: Antifungal medicines taken by mouth (orally). You may need to take the medicine for several weeks or several months, and you may not see the results for a long time. These medicines can cause side effects. Ask your health care provider what problems to watch for. Antifungal  nail polish or nail cream. These may be used along with oral antifungal medicines. Laser treatment of the nail. Surgery to remove the nail. This may be needed for the most severe infections. It can take a long time, usually up to a year, for the infection to go away.The infection may also come back. Follow these instructions at home: Medicines Take or apply over-the-counter and prescription medicines only as told by your health care provider. Ask your health care provider about using over-the-counter mentholated ointment on your nails. Nail care Trim your nails often. Wash and dry your hands and feet every day. Keep your feet dry: Wear absorbent socks, and change your socks frequently. Wear shoes that allow air to circulate, such as sandals or canvas tennis shoes. Throw out old shoes. Do not use artificial nails. If you go to a nail salon, make sure you choose one that uses clean instruments. Use antifungal foot powder on your feet and in your shoes. General instructions Do not share personal items, such as towels or nail clippers. Do not walk barefoot in shower rooms or locker rooms. Wear rubber gloves if you are working with your hands in wet areas. Keep all follow-up visits as told by your health care provider. This is important. Contact a health care provider if: Your infection is not getting better or it is getting worse after severalmonths. Summary A fungal nail infection is a common infection of the toenails or fingernails. Treatment is not needed for mild infections. If you have significant nail changes, treatment may include taking medicine orally and applying medicine to  your nails. It can take a long time, usually up to a year, for the infection to go away. The infection may also come back. Take or apply over-the-counter and prescription medicines only as told by your health care provider. Follow instructions for taking care of your nails to help prevent infection from coming  back or spreading. This information is not intended to replace advice given to you by your health care provider. Make sure you discuss any questions you have with your healthcare provider. Document Revised: 05/08/2018 Document Reviewed: 06/21/2017 Elsevier Patient Education  [place].

## 2020-07-14 NOTE — Progress Notes (Signed)
Acute Office Visit  Subjective:    Patient ID: [name] Woods, male    DOB: 01/30/1940, 80 y.o.   MRN: [id number]  Chief Complaint  Patient presents with   Nail Problem    Left Big toe; Pt denies any pain, but says that the appearance is not good. He states being there for a while.     HPI Patient is in today with concerns of a fungus to his left big toe x 6-10 months. He reports trying OTC antifungal medications without relief. The toe nail is becoming more thick, discolored and brittle. No pmhx of liver problems.   Patient also has concerns of discoloration to both lower extremities x about 4 years and worsening. He reports having chronic pain in his legs but no new symptoms.Denies swelling, numbness or burning. Has not tried anything for relief. Wife is concerned.   Past Medical History:  Diagnosis Date   Ankylosing spondylitis ([place])    Anxiety and depression 09/12/2015   Aortic atherosclerosis (H[facility])    Arthritis    Bilateral inguinal hernia    CAD (coronary artery disease) 3329,5188   30% mid LAD lesion on cardiac cath   Chicken pox    Cholelithiasis    CHOLELITHIASIS, HX OF 04/25/2007   Qualifier: Diagnosis of  By: Marland Mcalpine     Diverticulosis 11/26/2018   Moderate, Left Noted on Colonoscopy   Dysrhythmia    atrial fibrillation   Gallstones    GERD (gastroesophageal reflux disease)    German measles    Hiatal hernia    History of colon polyps 11/26/2018   History of kidney stones    HLD (hyperlipidemia)    HTN (hypertension)    Hypertension    Nephrolithiasis    NEPHROLITHIASIS, HX OF 04/25/2007   Qualifier: Diagnosis of  By: Marland Mcalpine     Partial small bowel obstruction ([place]) 03/06/2020   Persistent atrial fibrillation ([place])    Sleep apnea    No CPAP   Tubular adenoma of colon 03/1993   Ventricular hypertrophy     Past Surgical History:  Procedure Laterality Date   BIOPSY  03/08/2020   Procedure: BIOPSY;  Surgeon: Yetta Flock,  MD;  Location: [place];  Service: Gastroenterology;;   CARDIAC CATHETERIZATION  03/2012   30% mid LAD lesion otherwise normal cors, LVEF 65%   CHOLECYSTECTOMY     COLONOSCOPY  11/26/2018   CYSTOSCOPY WITH RETROGRADE PYELOGRAM, URETEROSCOPY AND STENT PLACEMENT Right 01/02/2019   Procedure: CYSTOSCOPY WITH RETROGRADE PYELOGRAM, URETEROSCOPY AND STENT PLACEMENT;  Surgeon: Alexis Frock, MD;  Location: [facility];  Service: Urology;  Laterality: Right;  1 HR   ELBOW ARTHROPLASTY Left    ESOPHAGOGASTRODUODENOSCOPY (EGD) WITH PROPOFOL N/A 03/08/2020   Procedure: ESOPHAGOGASTRODUODENOSCOPY (EGD) WITH PROPOFOL;  Surgeon: Yetta Flock, MD;  Location: [place];  Service: Gastroenterology;  Laterality: N/A;   HAND SURGERY Left    HOLMIUM LASER APPLICATION Right 41/06/6061   Procedure: HOLMIUM LASER APPLICATION;  Surgeon: Alexis Frock, MD;  [facility];  Service: Urology;  Laterality: Right;   KNEE SURGERY     x2 right   LEFT AND RIGHT HEART CATHETERIZATION WITH CORONARY ANGIOGRAM N/A 03/24/2012   Procedure: LEFT AND RIGHT HEART CATHETERIZATION WITH CORONARY ANGIOGRAM;  Surgeon: Sherren Mocha, MD;  Location: [facility] CATH LAB;  Service: Cardiovascular;  Laterality: N/A;   LEG FLUID REMOVAL RIGHT     TONSILLECTOMY     UPPER GI ENDOSCOPY      Family  History  Problem Relation Age of Onset   Heart disease Father 54       Deceased   Heart attack Father 51   Heart disease Brother 33       "Died in his sleep"   Arthritis/Rheumatoid Mother        Deceased-86   Hyperlipidemia Brother    Stroke Brother 46       Deceased   Other Sister        Estate agent in Home   Other Sister        MVA   Other Brother        Carjacked-killed   Dementia Paternal Grandmother    Cancer Paternal Uncle    Healthy Brother    Healthy Son        X3   Healthy Daughter        x1   Colon cancer Neg Hx    Esophageal cancer Neg Hx    Stomach cancer Neg Hx    Rectal cancer Neg Hx     Social History    Socioeconomic History   Marital status: Married    Spouse name: Not on file   Number of children: 4   Years of education: Not on file   Highest education level: Not on file  Occupational History   Occupation: Retired    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  Tobacco Use   Smoking status: Never   Smokeless tobacco: Never  Vaping Use   Vaping Use: Never used  Substance and Sexual Activity   Alcohol use: No   Drug use: No   Sexual activity: Yes  Other Topics Concern   Not on file  Social History Narrative   4 caffeine drinks daily    Social Determinants of Health   Financial Resource Strain: Not on file  Food Insecurity: Not on file  Transportation Needs: Not on file  Physical Activity: Not on file  Stress: Not on file  Social Connections: Not on file  Intimate Partner Violence: Not on file    Outpatient Medications Prior to Visit  Medication Sig Dispense Refill   allopurinol (ZYLOPRIM) 100 MG tablet TAKE 1 TABLET (100 MG TOTAL) BY MOUTH DAILY. 90 tablet 1   apixaban (ELIQUIS) 5 MG TABS tablet TAKE 1 TABLET BY MOUTH 2 TIMES DAILY. 180 tablet 1   atorvastatin (LIPITOR) 10 MG tablet Take 1 tablet (10 mg total) by mouth daily. 90 tablet 1   diltiazem (CARDIZEM CD) 240 MG 24 hr capsule Take 1 capsule (240 mg total) by mouth daily. 90 capsule 3   furosemide (LASIX) 20 MG tablet Take 1 tablet (20 mg total) by mouth daily. 90 tablet 3   HYDROcodone-acetaminophen (NORCO) 10-325 MG tablet Take 1 tablet by mouth every 6 (six) hours as needed. 90 tablet 0   metoprolol succinate (TOPROL-XL) 100 MG 24 hr tablet TAKE 1 TABLET  TWO TIMES DAILY  WITH OR IMMEDIATELY FOLLOWING A MEAL. 180 tablet 3   sildenafil (REVATIO) 20 MG tablet TAKE 1 TABLET BY MOUTH AS NEEDED FOR ERECTILE DYSFUNCTION. DO NO TAKE MORE THAN 1 DOSE IN 36 HOURS. 30 tablet 0   No facility-administered medications prior to visit.    Allergies  Allergen Reactions   Coumadin [Warfarin] Other (See Comments)     GI bleeds    Review of Systems  Constitutional: Negative.   Respiratory: Negative.    Gastrointestinal: Negative.   Endocrine: Negative.   Musculoskeletal:  Positive for arthralgias,  back pain and myalgias. Negative for joint swelling.       Chronic pain  Skin:  Positive for color change.       Both lower extremities, skin turning brown  Left great toe: thick, yellow, brittle   Allergic/Immunologic: Negative.   Neurological: Negative.   All other systems reviewed and are negative.     Objective:    Physical Exam Vitals and nursing note reviewed.  Constitutional:      Appearance: Normal appearance.  Cardiovascular:     Rate and Rhythm: Normal rate. Rhythm irregular.     Pulses: Normal pulses.     Heart sounds: Normal heart sounds.  Pulmonary:     Effort: Pulmonary effort is normal.     Breath sounds: Normal breath sounds.  Musculoskeletal:        General: Normal range of motion.     Cervical back: Normal range of motion and neck supple.     Right lower leg: No edema.     Left lower leg: No edema.  Skin:    General: Skin is warm and dry.     Coloration: Skin is not cyanotic.       Neurological:     General: No focal deficit present.     Mental Status: He is alert and oriented to person, place, and time.    BP 128/66   Pulse (!) 48   Temp (!) 97.5 F (36.4 C) (Temporal)   Ht 6' (1.829 m)   Wt 198 lb 9.6 oz (90.1 kg)   SpO2 96%   BMI 26.94 kg/m  Wt Readings from Last 3 Encounters:  07/14/20 198 lb 9.6 oz (90.1 kg)  04/15/20 200 lb (90.7 kg)  04/07/20 201 lb 4 oz (91.3 kg)    Health Maintenance Due  Topic Date Due   Hepatitis C Screening  Never done   Zoster Vaccines- Shingrix (1 of 2) Never done   COVID-19 Vaccine (3 - Pfizer risk series) 04/29/2019    There are no preventive care reminders to display for this patient.   Lab Results  Component Value Date   TSH 2.45 04/15/2020   Lab Results  Component Value Date   WBC 8.1 04/15/2020   HGB 16.7  04/15/2020   HCT 49.9 04/15/2020   MCV 88.7 04/15/2020   PLT 201.0 04/15/2020   Lab Results  Component Value Date   NA 145 04/15/2020   K 4.5 04/15/2020   CO2 27 04/15/2020   GLUCOSE 101 (H) 04/15/2020   BUN 15 04/15/2020   CREATININE 1.27 04/15/2020   BILITOT 2.3 (H) 04/15/2020   ALKPHOS 87 04/15/2020   AST 23 04/15/2020   ALT 14 04/15/2020   PROT 6.6 04/15/2020   ALBUMIN 4.1 04/15/2020   CALCIUM 9.7 04/15/2020   ANIONGAP 9 03/09/2020   GFR 53.86 (L) 04/15/2020   Lab Results  Component Value Date   CHOL 115 02/18/2020   Lab Results  Component Value Date   HDL 39 (L) 02/18/2020   Lab Results  Component Value Date   LDLCALC 58 02/18/2020   Lab Results  Component Value Date   TRIG 94 02/18/2020   Lab Results  Component Value Date   CHOLHDL 2.9 02/18/2020   Lab Results  Component Value Date   HGBA1C 5.9 (H) 03/07/2020       Assessment & Plan:   Problem List Items Addressed This Visit   None Visit Diagnoses     Fungal infection of toenail    -  Primary   Relevant Medications   ciclopirox (PENLAC) 8 % solution   Discoloration of skin of lower leg       Relevant Orders   VAS [place] LOWER EXTREMITY VENOUS (DVT)        Meds ordered this encounter  Medications   ciclopirox (PENLAC) 8 % solution    Sig: Apply topically at bedtime. Apply over nail and surrounding skin. Apply daily over previous coat. After seven (7) days, may remove with alcohol and continue cycle.    Dispense:  6.6 mL    Refill:  1   Penalc ordered and discussed with patient that it may be 6-9 months before his toenail is better. Explained use of Penlac. For concerns of discoloration, venous duplex ordered to evaluate blood flow. Encouraged compression socks. Routine exercise. Follow-up pending results.   Kennyth Arnold, FNP

## 2020-07-21 ENCOUNTER — Ambulatory Visit (HOSPITAL_COMMUNITY)
Admission: RE | Admit: 2020-07-21 | Discharge: 2020-07-21 | Disposition: A | Discharge status: Home / Self Care | Payer: Medicare HMO | Source: Ambulatory Visit | Attending: Family | Admitting: Family

## 2020-07-21 ENCOUNTER — Other Ambulatory Visit: Payer: Self-pay

## 2020-07-21 DIAGNOSIS — L819 Disorder of pigmentation, unspecified: Secondary | ICD-10-CM

## 2020-07-22 ENCOUNTER — Other Ambulatory Visit: Payer: Self-pay | Admitting: Family

## 2020-07-22 DIAGNOSIS — R591 Generalized enlarged lymph nodes: Secondary | ICD-10-CM

## 2020-07-25 ENCOUNTER — Ambulatory Visit (INDEPENDENT_AMBULATORY_CARE_PROVIDER_SITE_OTHER): Payer: Medicare HMO

## 2020-07-25 DIAGNOSIS — Z0000 Encounter for general adult medical examination without abnormal findings: Secondary | ICD-10-CM | POA: Diagnosis not present

## 2020-07-25 NOTE — Progress Notes (Signed)
Virtual Visit via Telephone Note  I connected with  [name] Woods on 07/25/20 at 10:15 AM EDT by telephone and verified that I am speaking with the correct person using two identifiers.  Medicare Annual Wellness visit completed telephonically due to Covid-19 pandemic.   Persons participating in this call: This Health Coach and this patient.   Location: Patient: home Provider: office   I discussed the limitations, risks, security and privacy concerns of performing an evaluation and management service by telephone and the availability of in person appointments. The patient expressed understanding and agreed to proceed.  Unable to perform video visit due to video visit attempted and failed and/or patient does not have video capability.   Some vital signs may be absent or patient reported.   Willette Brace, LPN   Subjective:   [name] Woods is a 80 y.o. male who presents for Medicare Annual/Subsequent preventive examination.  Review of Systems     Cardiac Risk Factors include: advanced age (>96men, >14 women);hypertension;dyslipidemia;male gender     Objective:    There were no vitals filed for this visit. There is no height or weight on file to calculate BMI.  Advanced Directives 07/25/2020 03/08/2020 03/07/2020 03/06/2020 01/02/2019 12/31/2018 03/02/2016  Does Patient Have a Medical Advance Directive? No No - No No No No  Would patient like information on creating a medical advance directive? No - Patient declined - No - Patient declined - No - Patient declined No - Patient declined -    Current Medications (verified) Outpatient Encounter Medications as of 07/25/2020  Medication Sig   allopurinol (ZYLOPRIM) 100 MG tablet TAKE 1 TABLET (100 MG TOTAL) BY MOUTH DAILY.   apixaban (ELIQUIS) 5 MG TABS tablet TAKE 1 TABLET BY MOUTH 2 TIMES DAILY.   atorvastatin (LIPITOR) 10 MG tablet Take 1 tablet (10 mg total) by mouth daily.   ciclopirox (PENLAC) 8 % solution Apply topically at  bedtime. Apply over nail and surrounding skin. Apply daily over previous coat. After seven (7) days, may remove with alcohol and continue cycle.   diltiazem (CARDIZEM CD) 240 MG 24 hr capsule Take 1 capsule (240 mg total) by mouth daily.   furosemide (LASIX) 20 MG tablet Take 1 tablet (20 mg total) by mouth daily.   HYDROcodone-acetaminophen (NORCO) 10-325 MG tablet Take 1 tablet by mouth every 6 (six) hours as needed.   metoprolol succinate (TOPROL-XL) 100 MG 24 hr tablet TAKE 1 TABLET  TWO TIMES DAILY  WITH OR IMMEDIATELY FOLLOWING A MEAL.   [DISCONTINUED] pantoprazole (PROTONIX) 40 MG tablet Take 1 tablet (40 mg total) by mouth daily.   No facility-administered encounter medications on file as of 07/25/2020.    Allergies (verified) Coumadin [warfarin]   History: Past Medical History:  Diagnosis Date   Ankylosing spondylitis ([place])    Anxiety and depression 09/12/2015   Aortic atherosclerosis (HCC)    Arthritis    Bilateral inguinal hernia    CAD (coronary artery disease) 5784,6962   30% mid LAD lesion on cardiac cath   Chicken pox    Cholelithiasis    CHOLELITHIASIS, HX OF 04/25/2007   Qualifier: Diagnosis of  By: Marland Mcalpine     Diverticulosis 11/26/2018   Moderate, Left Noted on Colonoscopy   Dysrhythmia    atrial fibrillation   Gallstones    GERD (gastroesophageal reflux disease)    German measles    Hiatal hernia    History of colon polyps 11/26/2018   History of kidney stones  HLD (hyperlipidemia)    HTN (hypertension)    Hypertension    Nephrolithiasis    NEPHROLITHIASIS, HX OF 04/25/2007   Qualifier: Diagnosis of  By: Marland Mcalpine     Partial small bowel obstruction ([place]) 03/06/2020   Persistent atrial fibrillation ([place])    Sleep apnea    No CPAP   Tubular adenoma of colon 03/1993   Ventricular hypertrophy    Past Surgical History:  Procedure Laterality Date   BIOPSY  03/08/2020   Procedure: BIOPSY;  Surgeon: Yetta Flock, MD;  Location:  [place];  Service: Gastroenterology;;   CARDIAC CATHETERIZATION  03/2012   30% mid LAD lesion otherwise normal cors, LVEF 65%   CHOLECYSTECTOMY     COLONOSCOPY  11/26/2018   CYSTOSCOPY WITH RETROGRADE PYELOGRAM, URETEROSCOPY AND STENT PLACEMENT Right 01/02/2019   Procedure: CYSTOSCOPY WITH RETROGRADE PYELOGRAM, URETEROSCOPY AND STENT PLACEMENT;  Surgeon: Alexis Frock, MD;  [facility];  Service: Urology;  Laterality: Right;  1 HR   ELBOW ARTHROPLASTY Left    ESOPHAGOGASTRODUODENOSCOPY (EGD) WITH PROPOFOL N/A 03/08/2020   Procedure: ESOPHAGOGASTRODUODENOSCOPY (EGD) WITH PROPOFOL;  Surgeon: Yetta Flock, MD;  Location: [place];  Service: Gastroenterology;  Laterality: N/A;   HAND SURGERY Left    HOLMIUM LASER APPLICATION Right 73/04/1935   Procedure: HOLMIUM LASER APPLICATION;  Surgeon: Alexis Frock, MD;  [facility];  Service: Urology;  Laterality: Right;   KNEE SURGERY     x2 right   LEFT AND RIGHT HEART CATHETERIZATION WITH CORONARY ANGIOGRAM N/A 03/24/2012   Procedure: LEFT AND RIGHT HEART CATHETERIZATION WITH CORONARY ANGIOGRAM;  Surgeon: Sherren Mocha, MD;  Location[facility];  Service: Cardiovascular;  Laterality: N/A;   LEG FLUID REMOVAL RIGHT     TONSILLECTOMY     UPPER GI ENDOSCOPY     Family History  Problem Relation Age of Onset   Heart disease Father 23       Deceased   Heart attack Father 34   Heart disease Brother 58       "Died in his sleep"   Arthritis/Rheumatoid Mother        Deceased-86   Hyperlipidemia Brother    Stroke Brother 43       Deceased   Other Sister        Estate agent in Home   Other Sister        MVA   Other Brother        Carjacked-killed   Dementia Paternal Grandmother    Cancer Paternal Uncle    Healthy Brother    Healthy Son        X3   Healthy Daughter        x1   Colon cancer Neg Hx    Esophageal cancer Neg Hx    Stomach cancer Neg Hx    Rectal cancer Neg Hx    Social History   Socioeconomic History    Marital status: Married    Spouse name: Not on file   Number of children: 4   Years of education: Not on file   Highest education level: Not on file  Occupational History   Occupation: Retired    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  Tobacco Use   Smoking status: Never   Smokeless tobacco: Never  Vaping Use   Vaping Use: Never used  Substance and Sexual Activity   Alcohol use: No   Drug use: No   Sexual activity: Yes  Other Topics Concern  Not on file  Social History Narrative   4 caffeine drinks daily    Social Determinants of Health   Financial Resource Strain: Low Risk    Difficulty of Paying Living Expenses: Not hard at all  Food Insecurity: No Food Insecurity   Worried About Charity fundraiser in the Last Year: Never true   Arboriculturist in the Last Year: Never true  Transportation Needs: No Transportation Needs   Lack of Transportation (Medical): No   Lack of Transportation (Non-Medical): No  Physical Activity: Inactive   Days of Exercise per Week: 0 days   Minutes of Exercise per Session: 0 min  Stress: No Stress Concern Present   Feeling of Stress : Not at all  Social Connections: Moderately Integrated   Frequency of Communication with Friends and Family: More than three times a week   Frequency of Social Gatherings with Friends and Family: More than three times a week   Attends Religious Services: 1 to 4 times per year   Active Member of [employer] or Organizations: No   Attends Music therapist: Never   Marital Status: Married    Tobacco Counseling Counseling given: Not Answered   Clinical Intake:  Pre-visit preparation completed: Yes  Pain : No/denies pain     BMI - recorded: 26.94 Nutritional Status: BMI 25 -29 Overweight Nutritional Risks: None Diabetes: No  How often do you need to have someone help you when you read instructions, pamphlets, or other written materials from your doctor or pharmacy?: 1 -  Never  Diabetic?no  Interpreter Needed?: No  Information entered by :: Charlott Rakes, LPN   Activities of Daily Living In your present state of health, do you have any difficulty performing the following activities: 07/25/2020 03/06/2020  Hearing? N -  Vision? N -  Difficulty concentrating or making decisions? N -  Walking or climbing stairs? Y -  Comment slowly -  Dressing or bathing? N -  Doing errands, shopping? N N  Preparing Food and eating ? N -  Using the Toilet? N -  In the past six months, have you accidently leaked urine? N -  Do you have problems with loss of bowel control? N -  Managing your Medications? N -  Managing your Finances? N -  Housekeeping or managing your Housekeeping? N -  Some recent data might be hidden    Patient Care Team: Vivi Barrack, MD as PCP - General (Family Medicine) Minus Breeding, MD as PCP - Cardiology (Cardiology) Kathie Rhodes, MD (Inactive) as Consulting Physician (Urology) Minus Breeding, MD as Consulting Physician (Cardiology) Madelin Rear, [facility] as Pharmacist (Pharmacist)  Indicate any recent Medical Services you may have received from other than [facility] providers in the past year (date may be approximate).     Assessment:   This is a routine wellness examination for Isahia.  Hearing/Vision screen Hearing Screening - Comments:: Pt denies any hearing issues  Vision Screening - Comments:: Encouraged pt to follow up with annual eye exams   Dietary issues and exercise activities discussed: Current Exercise Habits: The patient does not participate in regular exercise at present   Goals Addressed             This Visit's Progress    Patient Stated       Stay healthy        Depression Screen PHQ 2/9 Scores 07/25/2020 02/11/2020 06/04/2018 11/26/2017 07/19/2017 03/01/2017 09/19/2016  PHQ - 2 Score 0 0 0 0  0 0 1  PHQ- 9 Score - - 0 0 0 0 2    Fall Risk Fall Risk  07/25/2020 07/14/2020 04/15/2020 03/27/2019 03/07/2018  Falls in  the past year? 0 0 0 0 0  Number falls in past yr: 0 - - 0 0  Injury with Fall? 0 - - 0 0  Risk for fall due to : Impaired vision - - - -  Follow up Falls prevention discussed - - Falls evaluation completed Falls evaluation completed    FALL RISK PREVENTION PERTAINING TO THE HOME:  Any stairs in or around the home? Yes  If so, are there any without handrails? No  Home free of loose throw rugs in walkways, pet beds, electrical cords, etc? Yes  Adequate lighting in your home to reduce risk of falls? Yes   ASSISTIVE DEVICES UTILIZED TO PREVENT FALLS:  Life alert? No  Use of a cane, walker or w/c? No  Grab bars in the bathroom? No  Shower chair or bench in shower? No  Elevated toilet seat or a handicapped toilet? No   TIMED UP AND GO:  Was the test performed? No     Cognitive Function: MMSE - Mini Mental State Exam 03/02/2016  Orientation to time 5  Orientation to Place 5  Registration 3  Attention/ Calculation 5  Recall 3  Language- name 2 objects 2  Language- repeat 1  Language- follow 3 step command 3  Language- read & follow direction 1  Write a sentence 1  Copy design 1  Total score 30     6CIT Screen 07/25/2020  What Year? 0 points  What month? 0 points  What time? 0 points  Count back from 20 0 points  Months in reverse 0 points  Repeat phrase 0 points  Total Score 0    Immunizations Immunization History  Administered Date(s) Administered   Fluad Quad(high Dose 65+) 11/17/2018, 10/29/2019   Influenza Split 11/30/2011   Influenza Whole 11/10/2009   Influenza, High Dose Seasonal PF 12/02/2012, 11/07/2013, 11/26/2017   Influenza,inj,Quad PF,6+ Mos 10/25/2014, 11/08/2015, 10/31/2016   PFIZER(Purple Top)SARS-COV-2 Vaccination 03/07/2019, 04/01/2019   Pneumococcal Conjugate-13 05/03/2014   Pneumococcal Polysaccharide-23 03/02/2016   Td 01/30/2004   Tdap 05/03/2014    TDAP status: Up to date  Flu Vaccine status: Up to date  Pneumococcal vaccine  status: Up to date  Covid-19 vaccine status: Completed vaccines  Qualifies for Shingles Vaccine? Yes   Zostavax completed No   Shingrix Completed?: No.    Education has been provided regarding the importance of this vaccine. Patient has been advised to call insurance company to determine out of pocket expense if they have not yet received this vaccine. Advised may also receive vaccine at local pharmacy or Health Dept. Verbalized acceptance and understanding.  Screening Tests Health Maintenance  Topic Date Due   Hepatitis C Screening  Never done   Zoster Vaccines- Shingrix (1 of 2) Never done   COVID-19 Vaccine (3 - Pfizer risk series) 04/29/2019   INFLUENZA VACCINE  08/29/2020   COLONOSCOPY (Pts 45-18yrs Insurance coverage will need to be confirmed)  11/25/2021   TETANUS/TDAP  05/02/2024   PNA vac Low Risk Adult  Completed   HPV VACCINES  Aged Out    Health Maintenance  Health Maintenance Due  Topic Date Due   Hepatitis C Screening  Never done   Zoster Vaccines- Shingrix (1 of 2) Never done   COVID-19 Vaccine (3 - Pfizer risk series) 04/29/2019  Colorectal cancer screening: Type of screening: Colonoscopy. Completed 11/26/18. Repeat every 3 years   Additional Screening:  Hepatitis C Screening: does qualify  Vision Screening: Recommended annual ophthalmology exams for early detection of glaucoma and other disorders of the eye. Is the patient up to date with their annual eye exam?  No  Who is the provider or what is the name of the office in which the patient attends annual eye exams? Encouraged  to follow up for annual eye exams  If pt is not established with a provider, would they like to be referred to a provider to establish care? No .   Dental Screening: Recommended annual dental exams for proper oral hygiene  Community Resource Referral / Chronic Care Management: CRR required this visit?  No   CCM required this visit?  No      Plan:     I have personally  reviewed and noted the following in the patient's chart:   Medical and social history Use of alcohol, tobacco or illicit drugs  Current medications and supplements including opioid prescriptions. Patient is currently taking opioid prescriptions. Information provided to patient regarding non-opioid alternatives. Patient advised to discuss non-opioid treatment plan with their provider. Functional ability and status Nutritional status Physical activity Advanced directives List of other physicians Hospitalizations, surgeries, and ER visits in previous 12 months Vitals Screenings to include cognitive, depression, and falls Referrals and appointments  In addition, I have reviewed and discussed with patient certain preventive protocols, quality metrics, and best practice recommendations. A written personalized care plan for preventive services as well as general preventive health recommendations were provided to patient.     Willette Brace, LPN   [id number]/[id number]   Nurse Notes: None

## 2020-07-25 NOTE — Patient Instructions (Signed)
[name] , Thank you for taking time to come for your Medicare Wellness Visit. I appreciate your ongoing commitment to your health goals. Please review the following plan we discussed and let me know if I can assist you in the future.   Screening recommendations/referrals: Colonoscopy: Done 11/26/18 repeat 11/25/21 Recommended yearly ophthalmology/optometry visit for glaucoma screening and checkup Recommended yearly dental visit for hygiene and checkup  Vaccinations: Influenza vaccine: Due 08/29/20 Pneumococcal vaccine: Completed  Tdap vaccine: Done 05/03/14 repeat 10 years 05/02/24 Shingles vaccine: Shingrix discussed. Please contact your pharmacy for coverage information.    Covid-19: Completed 2/6 & 04/01/19  Advanced directives: Advance directive discussed with you today. Even though you declined this today please call our office should you change your mind and we can give you the proper paperwork for you to fill out.  Conditions/risks identified: Stay healthy  Next appointment: Follow up in one year for your annual wellness visit.   Preventive Care 29 Years and Older, Male Preventive care refers to lifestyle choices and visits with your health care provider that can promote health and wellness. What does preventive care include? A yearly physical exam. This is also called an annual well check. Dental exams once or twice a year. Routine eye exams. Ask your health care provider how often you should have your eyes checked. Personal lifestyle choices, including: Daily care of your teeth and gums. Regular physical activity. Eating a healthy diet. Avoiding tobacco and drug use. Limiting alcohol use. Practicing safe sex. Taking low doses of aspirin every day. Taking vitamin and mineral supplements as recommended by your health care provider. What happens during an annual well check? The services and screenings done by your health care provider during your annual well check will  depend on your age, overall health, lifestyle risk factors, and family history of disease. Counseling  Your health care provider may ask you questions about your: Alcohol use. Tobacco use. Drug use. Emotional well-being. Home and relationship well-being. Sexual activity. Eating habits. History of falls. Memory and ability to understand (cognition). Work and work Statistician. Screening  You may have the following tests or measurements: Height, weight, and BMI. Blood pressure. Lipid and cholesterol levels. These may be checked every 5 years, or more frequently if you are over 23 years old. Skin check. Lung cancer screening. You may have this screening every year starting at age 54 if you have a 30-pack-year history of smoking and currently smoke or have quit within the past 15 years. Fecal occult blood test (FOBT) of the stool. You may have this test every year starting at age 66. Flexible sigmoidoscopy or colonoscopy. You may have a sigmoidoscopy every 5 years or a colonoscopy every 10 years starting at age 1. Prostate cancer screening. Recommendations will vary depending on your family history and other risks. Hepatitis C blood test. Hepatitis B blood test. Sexually transmitted disease (STD) testing. Diabetes screening. This is done by checking your blood sugar (glucose) after you have not eaten for a while (fasting). You may have this done every 1-3 years. Abdominal aortic aneurysm (AAA) screening. You may need this if you are a current or former smoker. Osteoporosis. You may be screened starting at age 26 if you are at high risk. Talk with your health care provider about your test results, treatment options, and if necessary, the need for more tests. Vaccines  Your health care provider may recommend certain vaccines, such as: Influenza vaccine. This is recommended every year. Tetanus, diphtheria, and acellular pertussis (  Tdap, Td) vaccine. You may need a Td booster every 10  years. Zoster vaccine. You may need this after age 26. Pneumococcal 13-valent conjugate (PCV13) vaccine. One dose is recommended after age 67. Pneumococcal polysaccharide (PPSV23) vaccine. One dose is recommended after age 51. Talk to your health care provider about which screenings and vaccines you need and how often you need them. This information is not intended to replace advice given to you by your health care provider. Make sure you discuss any questions you have with your health care provider. Document Released: 02/11/2015 Document Revised: 10/05/2015 Document Reviewed: 11/16/2014 Elsevier Interactive Patient Education  2017 [employer] Prevention in the Home Falls can cause injuries. They can happen to people of all ages. There are many things you can do to make your home safe and to help prevent falls. What can I do on the outside of my home? Regularly fix the edges of walkways and driveways and fix any cracks. Remove anything that might make you trip as you walk through a door, such as a raised step or threshold. Trim any bushes or trees on the path to your home. Use bright outdoor lighting. Clear any walking paths of anything that might make someone trip, such as rocks or tools. Regularly check to see if handrails are loose or broken. Make sure that both sides of any steps have handrails. Any raised decks and porches should have guardrails on the edges. Have any leaves, snow, or ice cleared regularly. Use sand or salt on walking paths during winter. Clean up any spills in your garage right away. This includes oil or grease spills. What can I do in the bathroom? Use night lights. Install grab bars by the toilet and in the tub and shower. Do not use towel bars as grab bars. Use non-skid mats or decals in the tub or shower. If you need to sit down in the shower, use a plastic, non-slip stool. Keep the floor dry. Clean up any water that spills on the floor as soon as it  happens. Remove soap buildup in the tub or shower regularly. Attach bath mats securely with double-sided non-slip rug tape. Do not have throw rugs and other things on the floor that can make you trip. What can I do in the bedroom? Use night lights. Make sure that you have a light by your bed that is easy to reach. Do not use any sheets or blankets that are too big for your bed. They should not hang down onto the floor. Have a firm chair that has side arms. You can use this for support while you get dressed. Do not have throw rugs and other things on the floor that can make you trip. What can I do in the kitchen? Clean up any spills right away. Avoid walking on wet floors. Keep items that you use a lot in easy-to-reach places. If you need to reach something above you, use a strong step stool that has a grab bar. Keep electrical cords out of the way. Do not use floor polish or wax that makes floors slippery. If you must use wax, use non-skid floor wax. Do not have throw rugs and other things on the floor that can make you trip. What can I do with my stairs? Do not leave any items on the stairs. Make sure that there are handrails on both sides of the stairs and use them. Fix handrails that are broken or loose. Make sure that handrails are  as long as the stairways. Check any carpeting to make sure that it is firmly attached to the stairs. Fix any carpet that is loose or worn. Avoid having throw rugs at the top or bottom of the stairs. If you do have throw rugs, attach them to the floor with carpet tape. Make sure that you have a light switch at the top of the stairs and the bottom of the stairs. If you do not have them, ask someone to add them for you. What else can I do to help prevent falls? Wear shoes that: Do not have high heels. Have rubber bottoms. Are comfortable and fit you well. Are closed at the toe. Do not wear sandals. If you use a stepladder: Make sure that it is fully opened.  Do not climb a closed stepladder. Make sure that both sides of the stepladder are locked into place. Ask someone to hold it for you, if possible. Clearly mark and make sure that you can see: Any grab bars or handrails. First and last steps. Where the edge of each step is. Use tools that help you move around (mobility aids) if they are needed. These include: Canes. Walkers. Scooters. Crutches. Turn on the lights when you go into a dark area. Replace any light bulbs as soon as they burn out. Set up your furniture so you have a clear path. Avoid moving your furniture around. If any of your floors are uneven, fix them. If there are any pets around you, be aware of where they are. Review your medicines with your doctor. Some medicines can make you feel dizzy. This can increase your chance of falling. Ask your doctor what other things that you can do to help prevent falls. This information is not intended to replace advice given to you by your health care provider. Make sure you discuss any questions you have with your health care provider. Document Released: 11/11/2008 Document Revised: 06/23/2015 Document Reviewed: 02/19/2014 Elsevier Interactive Patient Education  2017 [employer].

## 2020-07-27 ENCOUNTER — Other Ambulatory Visit (HOSPITAL_COMMUNITY): Payer: Self-pay

## 2020-07-27 MED FILL — Apixaban Tab 5 MG: ORAL | 15 days supply | Qty: 30 | Fill #3 | Status: AC

## 2020-08-08 ENCOUNTER — Telehealth: Payer: Self-pay

## 2020-08-08 NOTE — Telephone Encounter (Signed)
Patient called returning a call. Can someone give him a call back?

## 2020-08-09 NOTE — Telephone Encounter (Signed)
See result note, Left message on voicemail to call office.

## 2020-08-10 ENCOUNTER — Other Ambulatory Visit (INDEPENDENT_AMBULATORY_CARE_PROVIDER_SITE_OTHER): Payer: Medicare HMO

## 2020-08-10 ENCOUNTER — Other Ambulatory Visit: Payer: Self-pay

## 2020-08-10 DIAGNOSIS — R591 Generalized enlarged lymph nodes: Secondary | ICD-10-CM | POA: Diagnosis not present

## 2020-08-10 DIAGNOSIS — G8929 Other chronic pain: Secondary | ICD-10-CM

## 2020-08-10 DIAGNOSIS — M459 Ankylosing spondylitis of unspecified sites in spine: Secondary | ICD-10-CM

## 2020-08-10 LAB — COMPREHENSIVE METABOLIC PANEL
ALT: 12 U/L (ref 0–53)
AST: 19 U/L (ref 0–37)
Albumin: 3.9 g/dL (ref 3.5–5.2)
Alkaline Phosphatase: 66 U/L (ref 39–117)
BUN: 17 mg/dL (ref 6–23)
CO2: 30 mEq/L (ref 19–32)
Calcium: 9.2 mg/dL (ref 8.4–10.5)
Chloride: 103 mEq/L (ref 96–112)
Creatinine, Ser: 1.33 mg/dL (ref 0.40–1.50)
GFR: 50.84 mL/min — ABNORMAL LOW (ref >60.00)
Glucose, Bld: 148 mg/dL — ABNORMAL HIGH (ref 70–99)
Potassium: 4 mEq/L (ref 3.5–5.1)
Sodium: 143 mEq/L (ref 135–145)
Total Bilirubin: 2.5 mg/dL — ABNORMAL HIGH (ref 0.2–1.2)
Total Protein: 6.4 g/dL (ref 6.0–8.3)

## 2020-08-10 LAB — PSA: PSA: 2.33 ng/mL (ref 0.10–4.00)

## 2020-08-11 ENCOUNTER — Other Ambulatory Visit (HOSPITAL_COMMUNITY): Payer: Self-pay

## 2020-08-11 ENCOUNTER — Other Ambulatory Visit: Payer: Self-pay | Admitting: Family Medicine

## 2020-08-11 DIAGNOSIS — M459 Ankylosing spondylitis of unspecified sites in spine: Secondary | ICD-10-CM

## 2020-08-11 LAB — CBC WITH DIFFERENTIAL/PLATELET
Basophils Absolute: 0.1 10*3/uL (ref 0.0–0.1)
Basophils Relative: 1.1 % (ref 0.0–3.0)
Eosinophils Absolute: 0.2 10*3/uL (ref 0.0–0.7)
Eosinophils Relative: 2.7 % (ref 0.0–5.0)
HCT: 47.3 % (ref 39.0–52.0)
Hemoglobin: 16.1 g/dL (ref 13.0–17.0)
Lymphocytes Relative: 27.5 % (ref 12.0–46.0)
Lymphs Abs: 2.2 10*3/uL (ref 0.7–4.0)
MCHC: 34.1 g/dL (ref 30.0–36.0)
MCV: 90.7 fl (ref 78.0–100.0)
Monocytes Absolute: 0.4 10*3/uL (ref 0.1–1.0)
Monocytes Relative: 5.1 % (ref 3.0–12.0)
Neutro Abs: 5 10*3/uL (ref 1.4–7.7)
Neutrophils Relative %: 63.6 % (ref 43.0–77.0)
Platelets: 207 10*3/uL (ref 150.0–400.0)
RBC: 5.21 Mil/uL (ref 4.22–5.81)
RDW: 15 % (ref 11.5–15.5)
WBC: 7.8 10*3/uL (ref 4.0–10.5)

## 2020-08-11 MED ORDER — HYDROCODONE-ACETAMINOPHEN 10-325 MG PO TABS
1.0000 | ORAL_TABLET | Freq: Four times a day (QID) | ORAL | 0 refills | Status: DC | PRN
  Filled 2020-08-11: qty 90, 23d supply, fill #0

## 2020-08-11 NOTE — Progress Notes (Signed)
Please inform patient of the following:  Labs are stable.

## 2020-08-12 ENCOUNTER — Other Ambulatory Visit (HOSPITAL_COMMUNITY): Payer: Self-pay

## 2020-08-30 ENCOUNTER — Other Ambulatory Visit (HOSPITAL_COMMUNITY): Payer: Self-pay

## 2020-08-30 MED FILL — Apixaban Tab 5 MG: ORAL | 7 days supply | Qty: 15 | Fill #4 | Status: AC

## 2020-09-08 ENCOUNTER — Other Ambulatory Visit: Payer: Self-pay | Admitting: Family Medicine

## 2020-09-08 ENCOUNTER — Other Ambulatory Visit (HOSPITAL_COMMUNITY): Payer: Self-pay

## 2020-09-08 DIAGNOSIS — M459 Ankylosing spondylitis of unspecified sites in spine: Secondary | ICD-10-CM

## 2020-09-12 ENCOUNTER — Other Ambulatory Visit (HOSPITAL_COMMUNITY): Payer: Self-pay

## 2020-09-12 MED ORDER — HYDROCODONE-ACETAMINOPHEN 10-325 MG PO TABS
1.0000 | ORAL_TABLET | Freq: Four times a day (QID) | ORAL | 0 refills | Status: DC | PRN
  Filled 2020-09-12: qty 90, 23d supply, fill #0

## 2020-09-14 ENCOUNTER — Ambulatory Visit (INDEPENDENT_AMBULATORY_CARE_PROVIDER_SITE_OTHER): Payer: Medicare HMO | Admitting: Family Medicine

## 2020-09-14 ENCOUNTER — Other Ambulatory Visit: Payer: Self-pay

## 2020-09-14 ENCOUNTER — Encounter: Payer: Self-pay | Admitting: Family Medicine

## 2020-09-14 ENCOUNTER — Other Ambulatory Visit (HOSPITAL_COMMUNITY): Payer: Self-pay

## 2020-09-14 VITALS — BP 127/57 | HR 62 | Temp 97.9°F | Ht 72.0 in | Wt 202.6 lb

## 2020-09-14 DIAGNOSIS — Z7901 Long term (current) use of anticoagulants: Secondary | ICD-10-CM | POA: Diagnosis not present

## 2020-09-14 DIAGNOSIS — M1990 Unspecified osteoarthritis, unspecified site: Secondary | ICD-10-CM

## 2020-09-14 DIAGNOSIS — M459 Ankylosing spondylitis of unspecified sites in spine: Secondary | ICD-10-CM

## 2020-09-14 DIAGNOSIS — I10 Essential (primary) hypertension: Secondary | ICD-10-CM | POA: Diagnosis not present

## 2020-09-14 MED ORDER — CYCLOBENZAPRINE HCL 10 MG PO TABS
10.0000 mg | ORAL_TABLET | Freq: Three times a day (TID) | ORAL | 0 refills | Status: DC | PRN
  Filled 2020-09-14: qty 30, 10d supply, fill #0

## 2020-09-14 MED ORDER — HYDROCODONE-ACETAMINOPHEN 10-325 MG PO TABS
1.0000 | ORAL_TABLET | Freq: Four times a day (QID) | ORAL | 0 refills | Status: DC | PRN
  Filled 2020-09-14 (×2): qty 120, 30d supply, fill #0

## 2020-09-14 MED ORDER — PREDNISONE 50 MG PO TABS
50.0000 mg | ORAL_TABLET | Freq: Every day | ORAL | 0 refills | Status: DC
Start: 2020-09-14 — End: 2020-10-25
  Filled 2020-09-14: qty 5, 5d supply, fill #0

## 2020-09-14 NOTE — Assessment & Plan Note (Addendum)
Severe arthritis.  On Norco 10-3 25 every 6 hours as needed.  Will refill today.  Database without red flags.  Minimal side effects.  Discussed with patient this is the highest dose that we can prescribe.  If continues to have issues will need referral to orthopedics or pain management.  Follow-up in 3 to 6 months.

## 2020-09-14 NOTE — Assessment & Plan Note (Signed)
Avoiding NSAIDs 

## 2020-09-14 NOTE — Patient Instructions (Signed)
It was very nice to see you today!  You have a strain in your rhomboid muscles.  Please work on the exercises.  Start the prednisone and Flexeril.  I will refill your Norco.  I will see back in 3 to 6 months.  Come back to see me sooner if needed.  Take care, Dr [name]  PLEASE NOTE:  If you had any lab tests please let us know if you have not heard back within a few days. You may see your results on mychart before we have a chance to review them but we will give you a call once they are reviewed by [place]. If we ordered any referrals today, please let us know if you have not heard from their office within the next week.   Please try these tips to maintain a healthy lifestyle:  Eat at least 3 REAL meals and 1-2 snacks per day.  Aim for no more than 5 hours between eating.  If you eat breakfast, please do so within one hour of getting up.   Each meal should contain half fruits/vegetables, one quarter protein, and one quarter carbs (no bigger than a computer mouse)  Cut down on sweet beverages. This includes juice, soda, and sweet tea.   Drink at least 1 glass of water with each meal and aim for at least 8 glasses per day  Exercise at least 150 minutes every week.

## 2020-09-14 NOTE — Progress Notes (Signed)
   [name] Woods is a 80 y.o. male who presents today for an office visit.  Assessment/Plan:  New/Acute Problems: Right shoulder pain/back pain Consistent with rhomboid strain.  Discussed home exercises and handout was given.  Cannot use NSAIDs due to him being anticoagulated on Eliquis.  We will start prednisone and Flexeril.  He will let me know if not improving over the next couple of weeks.  And we can refer to sports medicine/for PT.  Chronic Problems Addressed Today: Essential hypertension At goal.  Continue diltiazem 240 mg daily and metoprolol succinate 100 mg twice daily.  Osteoarthritis Severe arthritis.  On Norco 10-3 25 every 6 hours as needed.  Will refill today.  Database without red flags.  Minimal side effects.  Discussed with patient this is the highest dose that we can prescribe.  If continues to have issues will need referral to orthopedics or pain management.  Follow-up in 3 to 6 months.  Chronic anticoagulation Avoiding NSAIDs.     Subjective:  HPI:  Last week starting 09/08/20 He has been experiencing pain in his right shoulder and upper back. He describes the pain as similar to a knife being dragged through his shoulder and back. This started after doing non-strenuous yard work. He experiences pain when pushing up against force with his thumbs pointing down, as well as moving his arm(s) in a rowing motion.  Additionally, his knee pain has been worsening recently He had used Norco to try to deal with the new shoulder and back pain, but it has not resolved the pain, only temporarily relieved it.  Flexeril has worked well in the past for him, but does produce drowsiness. Prednisone has also worked well for him, without notable side effects.  He denies sleeping problems.  He is compliant with all medication with no notable side effects.       Objective:  Physical Exam: BP (!) 127/57   Pulse 62   Temp 97.9 F (36.6 C) (Temporal)   Ht 6' (1.829 m)   Wt  202 lb 9.6 oz (91.9 kg)   SpO2 97%   BMI 27.48 kg/m   Gen: No acute distress, resting comfortably CV: Regular rate and rhythm with no murmurs appreciated Pulm: Normal work of breathing, clear to auscultation bilaterally with no crackles, wheezes, or rhonchi MSK: Back without obvious deformities.  Tenderness to palpation along the right cervical paraspinal muscles near the rhomboid muscle group.  Pain elicited with throwing motion and palpation of the area.  Neurovascular intact distally.  Normal supraspinatus testing.  Full range of motion throughout. Neuro: Grossly normal, moves all extremities Psych: Normal affect and thought content      I,Jordan Kelly,acting as a scribe for [name], MD.,have documented all relevant documentation on the behalf of [name], MD,as directed by  [name], MD while in the presence of [name], MD.   I, [name], MD, have reviewed all documentation for this visit. The documentation on 09/14/20 for the exam, diagnosis, procedures, and orders are all accurate and complete.  Algis Greenhouse. [name], MD 09/14/2020 11:51 AM

## 2020-09-14 NOTE — Assessment & Plan Note (Signed)
At goal.  Continue diltiazem 240 mg daily and metoprolol succinate 100 mg twice daily.

## 2020-09-15 ENCOUNTER — Other Ambulatory Visit (HOSPITAL_COMMUNITY): Payer: Self-pay

## 2020-09-15 MED FILL — Apixaban Tab 5 MG: ORAL | 15 days supply | Qty: 30 | Fill #5 | Status: AC

## 2020-10-13 ENCOUNTER — Other Ambulatory Visit (HOSPITAL_COMMUNITY): Payer: Self-pay

## 2020-10-13 ENCOUNTER — Other Ambulatory Visit: Payer: Self-pay | Admitting: Family Medicine

## 2020-10-13 DIAGNOSIS — M459 Ankylosing spondylitis of unspecified sites in spine: Secondary | ICD-10-CM

## 2020-10-13 MED FILL — Apixaban Tab 5 MG: ORAL | 15 days supply | Qty: 30 | Fill #6 | Status: AC

## 2020-10-14 ENCOUNTER — Other Ambulatory Visit (HOSPITAL_COMMUNITY): Payer: Self-pay

## 2020-10-14 ENCOUNTER — Other Ambulatory Visit: Payer: Self-pay | Admitting: Family Medicine

## 2020-10-14 DIAGNOSIS — M459 Ankylosing spondylitis of unspecified sites in spine: Secondary | ICD-10-CM

## 2020-10-14 MED ORDER — HYDROCODONE-ACETAMINOPHEN 10-325 MG PO TABS
1.0000 | ORAL_TABLET | Freq: Four times a day (QID) | ORAL | 0 refills | Status: DC | PRN
  Filled 2020-10-14: qty 120, 30d supply, fill #0

## 2020-10-19 ENCOUNTER — Other Ambulatory Visit (HOSPITAL_COMMUNITY): Payer: Self-pay

## 2020-10-24 ENCOUNTER — Telehealth: Payer: Self-pay | Admitting: Cardiology

## 2020-10-24 NOTE — Telephone Encounter (Signed)
Patient called to setup echocardiogram. Explain to patient that there is no ordered in for that. Patient would like for that to be ordered

## 2020-10-24 NOTE — Telephone Encounter (Signed)
Left message for patient to call back to give [place] more information regarding his request for an echo.

## 2020-10-25 ENCOUNTER — Other Ambulatory Visit (HOSPITAL_COMMUNITY): Payer: Self-pay

## 2020-10-25 ENCOUNTER — Encounter: Payer: Self-pay | Admitting: Physician Assistant

## 2020-10-25 ENCOUNTER — Telehealth (INDEPENDENT_AMBULATORY_CARE_PROVIDER_SITE_OTHER): Payer: Medicare HMO | Admitting: Physician Assistant

## 2020-10-25 VITALS — BP 165/78 | HR 70 | Temp 97.3°F

## 2020-10-25 DIAGNOSIS — R059 Cough, unspecified: Secondary | ICD-10-CM | POA: Diagnosis not present

## 2020-10-25 MED ORDER — AZITHROMYCIN 250 MG PO TABS
ORAL_TABLET | ORAL | 0 refills | Status: AC
  Filled 2020-10-25: qty 6, 5d supply, fill #0

## 2020-10-25 MED ORDER — PREDNISONE 50 MG PO TABS
50.0000 mg | ORAL_TABLET | Freq: Every day | ORAL | 0 refills | Status: DC
  Filled 2020-10-25: qty 5, 5d supply, fill #0

## 2020-10-25 NOTE — Progress Notes (Signed)
Virtual Visit via Video   I connected with [name] Woods on 10/25/20 at  8:30 AM EDT by a video enabled telemedicine application and verified that I am speaking with the correct person using two identifiers. Location patient: Home Location provider: [name], Office Persons participating in the virtual visit: [name] Woods, [place] PA-C  I discussed the limitations of evaluation and management by telemedicine and the availability of in person appointments. The patient expressed understanding and agreed to proceed.  Subjective:   HPI:   Cough Symptoms started 5 days ago.  He started with nasal congestion that has now moved into his chest. Had two negative home COVID tests. Around someone last week with similar symptoms. Starting to have slight SOB, worse with lying down. Taking Coricidin products and drinking plenty of water. Denies: fever, chills, LE swelling, chest pain.  BP Readings from Last 3 Encounters:  10/25/20 (!) 165/78  09/14/20 (!) 127/57  07/14/20 128/66   Oxygen is 95%.   ROS: See pertinent positives and negatives per HPI.  Patient Active Problem List   Diagnosis Date Noted   Osteoarthritis 04/15/2020   Gastric nodule    OSA (obstructive sleep apnea) 03/06/2020   Chronic atrial fibrillation ([place]) 02/12/2019   Kidney stones, calcium oxalate 12/03/2018   Hyperlipidemia 02/06/2018   Encounter for chronic pain management 03/01/2017   Hyperglycemia 04/23/2016   Chronic anticoagulation 03/02/2015   Gout 06/30/2014   CAD (coronary artery disease) 03/21/2012   GERD (gastroesophageal reflux disease) 03/21/2012   Essential hypertension 04/25/2007    Social History   Tobacco Use   Smoking status: Never   Smokeless tobacco: Never  Substance Use Topics   Alcohol use: No    Current Outpatient Medications:    allopurinol (ZYLOPRIM) 100 MG tablet, TAKE 1 TABLET (100 MG TOTAL) BY MOUTH DAILY., Disp: 90 tablet, Rfl: 1   apixaban (ELIQUIS) 5 MG  TABS tablet, TAKE 1 TABLET BY MOUTH 2 TIMES DAILY., Disp: 180 tablet, Rfl: 1   atorvastatin (LIPITOR) 10 MG tablet, Take 1 tablet (10 mg total) by mouth daily., Disp: 90 tablet, Rfl: 1   azithromycin (ZITHROMAX) 250 MG tablet, Take 2 tablets on day 1, then 1 tablet daily on days 2 through 5, Disp: 6 tablet, Rfl: 0   ciclopirox (PENLAC) 8 % solution, Apply topically at bedtime. Apply over nail and surrounding skin. Apply daily over previous coat. After seven (7) days, may remove with alcohol and continue cycle., Disp: 6.6 mL, Rfl: 1   cyclobenzaprine (FLEXERIL) 10 MG tablet, Take 1 tablet (10 mg total) by mouth 3 (three) times daily as needed for muscle spasms., Disp: 30 tablet, Rfl: 0   diltiazem (CARDIZEM CD) 240 MG 24 hr capsule, Take 1 capsule (240 mg total) by mouth daily., Disp: 90 capsule, Rfl: 3   furosemide (LASIX) 20 MG tablet, Take 1 tablet (20 mg total) by mouth daily., Disp: 90 tablet, Rfl: 3   HYDROcodone-acetaminophen (NORCO) 10-325 MG tablet, Take 1 tablet by mouth every 6 (six) hours as needed., Disp: 120 tablet, Rfl: 0   metoprolol succinate (TOPROL-XL) 100 MG 24 hr tablet, TAKE 1 TABLET  TWO TIMES DAILY  WITH OR IMMEDIATELY FOLLOWING A MEAL., Disp: 180 tablet, Rfl: 3   predniSONE (DELTASONE) 50 MG tablet, Take 1 tablet (50 mg total) by mouth daily for 5 days., Disp: 5 tablet, Rfl: 0  Allergies  Allergen Reactions   Coumadin [Warfarin] Other (See Comments)    GI bleeds    Objective:  VITALS: Per patient if applicable, see vitals. GENERAL: Alert, appears well and in no acute distress. HEENT: Atraumatic, conjunctiva clear, no obvious abnormalities on inspection of external nose and ears. NECK: Normal movements of the head and neck. CARDIOPULMONARY: No increased WOB. Speaking in clear sentences. I:E ratio WNL.  MS: Moves all visible extremities without noticeable abnormality. PSYCH: Pleasant and cooperative, well-groomed. Speech normal rate and rhythm. Affect is appropriate.  Insight and judgement are appropriate. Attention is focused, linear, and appropriate.  NEURO: CN grossly intact. Oriented as arrived to appointment on time with no prompting. Moves both UE equally.  SKIN: No obvious lesions, wounds, erythema, or cyanosis noted on face or hands.  Assessment and Plan:   [place] was seen today for cough and nasal congestion.  Diagnoses and all orders for this visit:  Cough  Other orders -     azithromycin (ZITHROMAX) 250 MG tablet; Take 2 tablets on day 1, then 1 tablet daily on days 2 through 5 -     predniSONE (DELTASONE) 50 MG tablet; Take 1 tablet (50 mg total) by mouth daily for 5 days.  No red flags on discussion, patient is not in any obvious distress during our visit. Will start oral azithromycin and oral prednisone to cover for early bacterial upper respiratory infection. I also advised patient to keep close tabs on his blood pressure given the elevation today which is unusual for him. I advised him to call us if it remains consistently greater than 140/90 or if he develops any cardiac symptoms. May continue Coricidin. Discussed over the counter supportive care options, with recommendations to push fluids and rest. Reviewed return precautions including new/worsening fever, SOB, new/worsening cough or other concerns.  Recommended need to self-quarantine and practice social distancing until symptoms resolve. Discussed current recommendations for COVID testing. I recommend that patient follow-up if symptoms worsen or persist despite treatment x 7-10 days, sooner if needed.  I discussed the assessment and treatment plan with the patient. The patient was provided an opportunity to ask questions and all were answered. The patient agreed with the plan and demonstrated an understanding of the instructions.   The patient was advised to call back or seek an in-person evaluation if the symptoms worsen or if the condition fails to improve as anticipated.   CMA  or LPN served as scribe during this visit. History, Physical, and Plan performed by medical provider. The above documentation has been reviewed and is accurate and complete.  [place], [place] 10/25/2020

## 2020-10-27 NOTE — Telephone Encounter (Signed)
Attempted to call patient on home number, no answer and no voice mail. Left message on patient's cell regarding our attempt to call him multiple times. Advised that he may call back and that he also can call our office to schedule his January follow-up with Dr. Percival Spanish.

## 2020-11-11 ENCOUNTER — Other Ambulatory Visit: Payer: Self-pay | Admitting: Family Medicine

## 2020-11-11 ENCOUNTER — Other Ambulatory Visit (HOSPITAL_COMMUNITY): Payer: Self-pay

## 2020-11-11 DIAGNOSIS — M459 Ankylosing spondylitis of unspecified sites in spine: Secondary | ICD-10-CM

## 2020-11-11 MED ORDER — HYDROCODONE-ACETAMINOPHEN 10-325 MG PO TABS
1.0000 | ORAL_TABLET | Freq: Four times a day (QID) | ORAL | 0 refills | Status: DC | PRN
  Filled 2020-11-11: qty 120, 30d supply, fill #0

## 2020-11-14 ENCOUNTER — Other Ambulatory Visit (HOSPITAL_COMMUNITY): Payer: Self-pay

## 2020-11-15 ENCOUNTER — Other Ambulatory Visit (HOSPITAL_COMMUNITY): Payer: Self-pay

## 2020-11-15 MED FILL — Apixaban Tab 5 MG: ORAL | 15 days supply | Qty: 30 | Fill #7 | Status: AC

## 2020-11-21 ENCOUNTER — Other Ambulatory Visit: Payer: Self-pay | Admitting: Family Medicine

## 2020-12-08 IMAGING — CT CT RENAL STONE PROTOCOL
1 of 2 series · 13 of 32 positions shown, 18 images · non-contrast
Comparison: 10/23/2018

CLINICAL DATA: LEFT flank pain colicky in nature beginning 3 months
ago with 3-4 days of gross hematuria, some back pain, history kidney
stones

EXAM:
CT ABDOMEN AND PELVIS WITHOUT CONTRAST
TECHNIQUE: Multidetector CT imaging of the abdomen and pelvis was performed
following the standard protocol without IV contrast. Sagittal and
coronal MPR images reconstructed from axial data set. Oral contrast
was not administered

[Series 2: abd/pelvis w/(date) · axial · 0.91mm/px · z∈[-473,-48]mm · 13 of 97 slices shown, 18 images]
[im 6/97  soft-tissue]
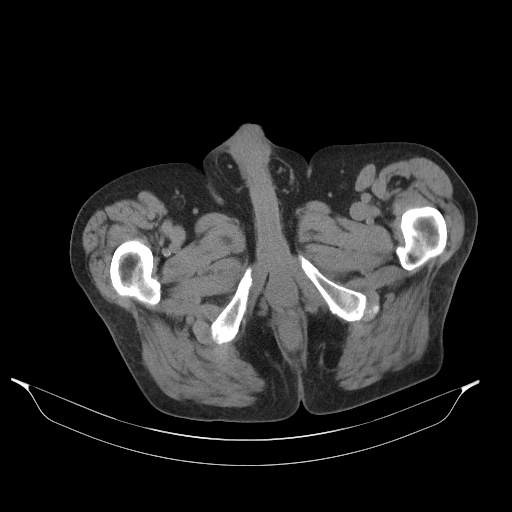
[im 6/97  bone]
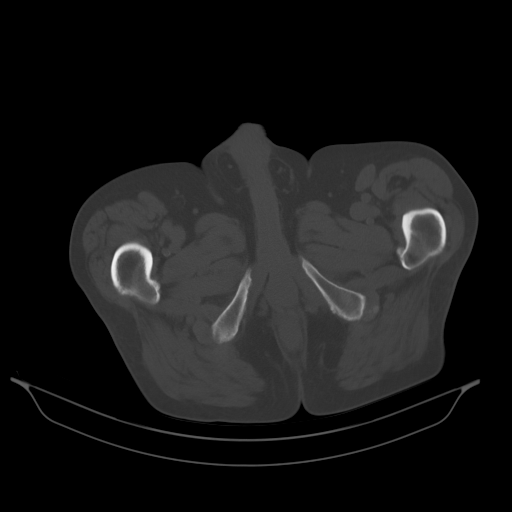
[im 17/97  soft-tissue]
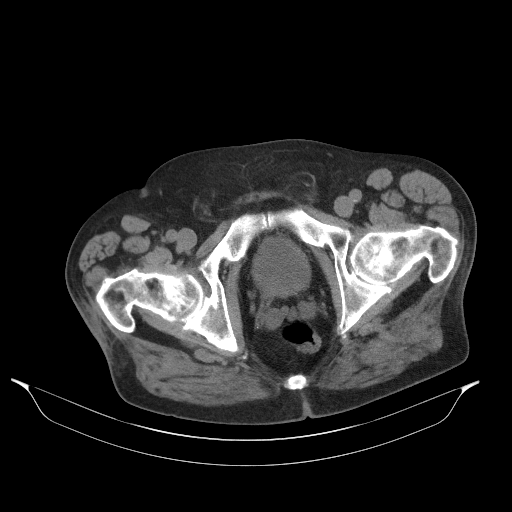
[im 22/97  soft-tissue]
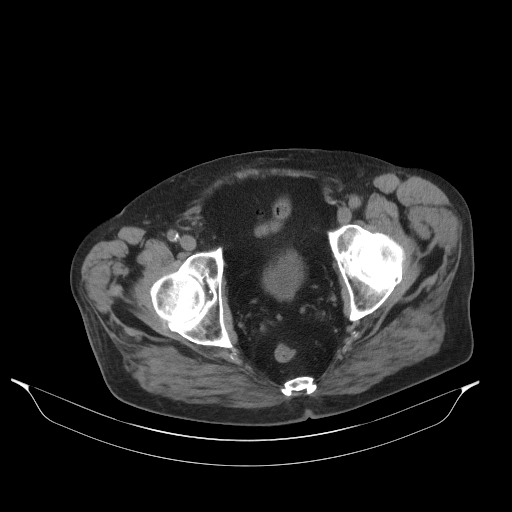
[im 27/97  soft-tissue]
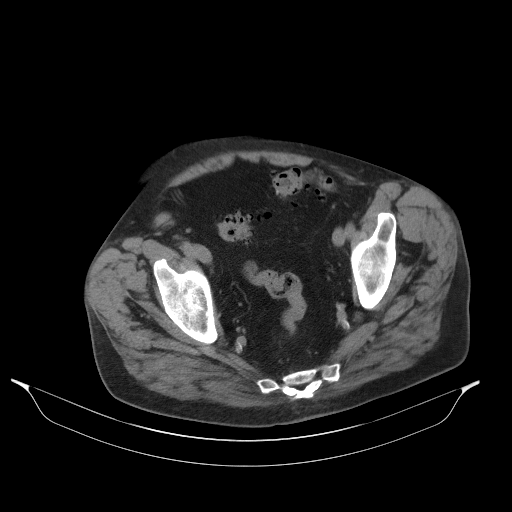
[im 38/97  soft-tissue]
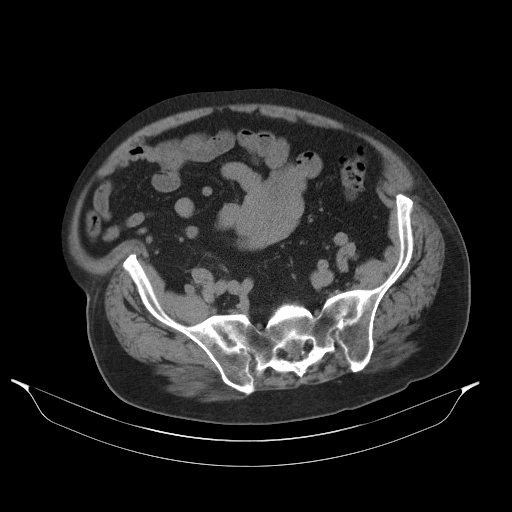
[im 43/97  soft-tissue]
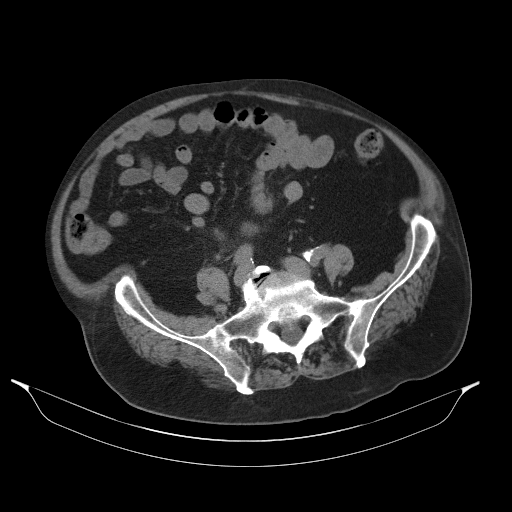
[im 54/97  soft-tissue]
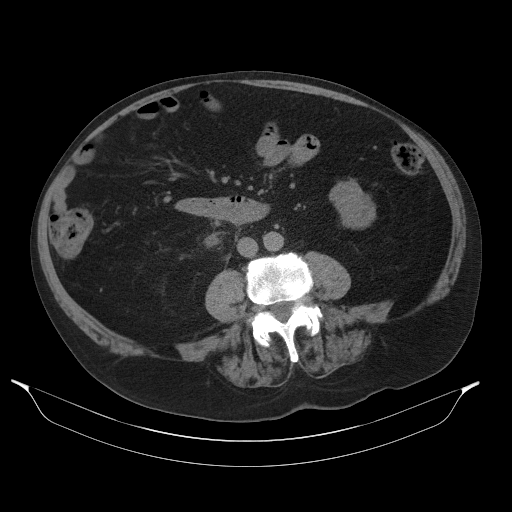
[im 59/97  soft-tissue]
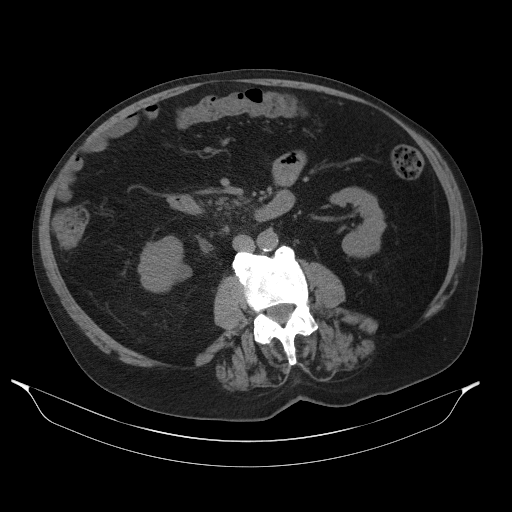
[im 70/97  soft-tissue]
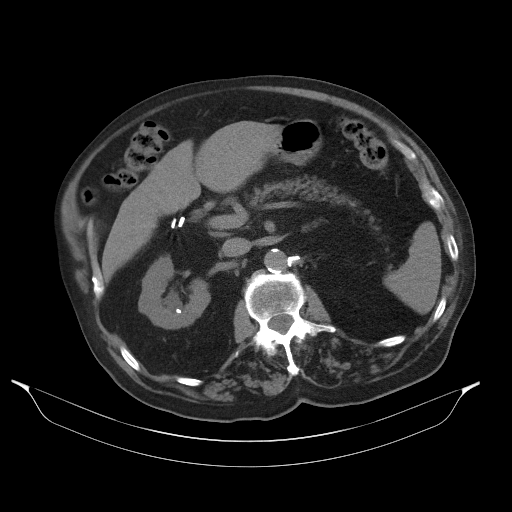
[im 70/97  bone]
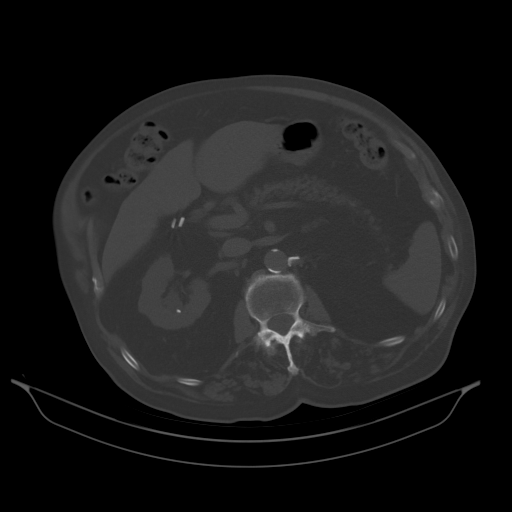
[im 75/97  soft-tissue]
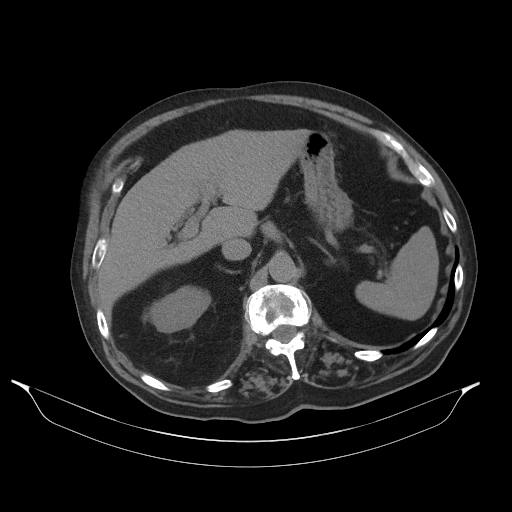
[im 75/97  lung]
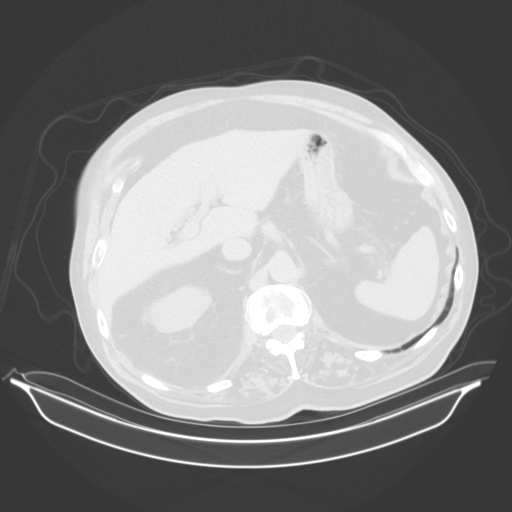
[im 81/97  soft-tissue]
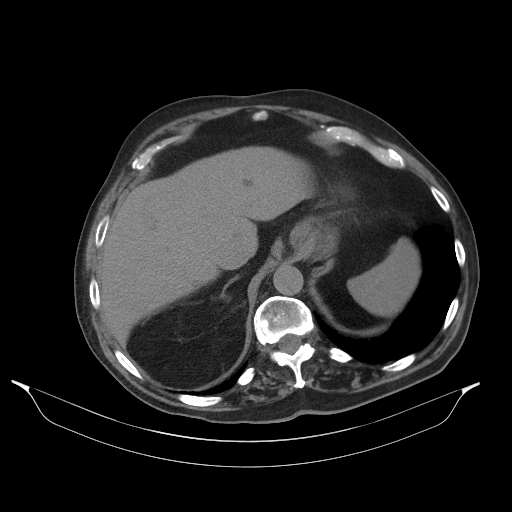
[im 81/97  lung]
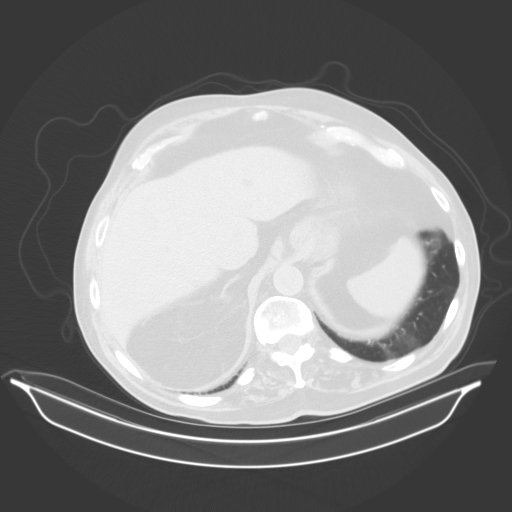
[im 86/97  lung]
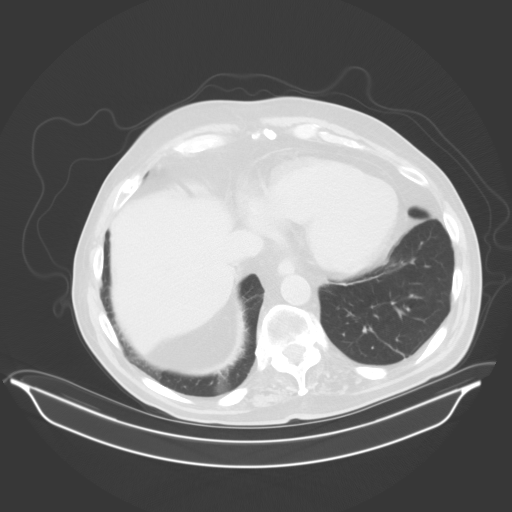
[im 91/97  soft-tissue]
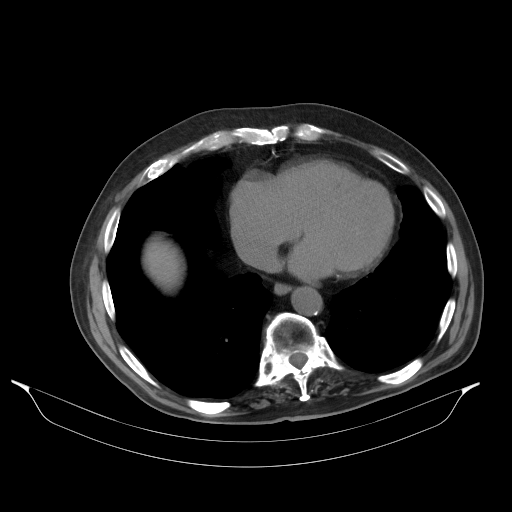
[im 91/97  lung]
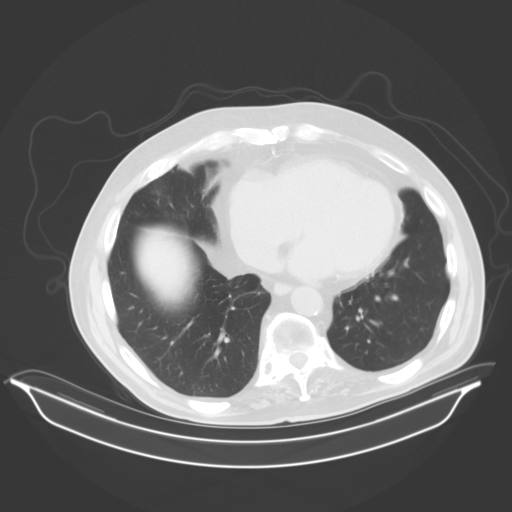

[13 of 32 positions shown; findings below may reference images not displayed]

FINDINGS: Lower chest: Calcified granuloma LEFT lower lobe image 25.
Subsegmental atelectasis at both bases.

Hepatobiliary: Probable small cyst LEFT lobe liver unchanged. Liver
otherwise unremarkable. Gallbladder surgically absent. No biliary
dilatation.

Pancreas: Generalized pancreatic atrophy without mass

Spleen: Normal appearance. Splenule between spleen and pancreatic
tail.

Adrenals/Urinary Tract: Adrenal glands unremarkable. Small BILATERAL
renal cysts. BILATERAL nonobstructing renal calculi largest LEFT
kidney 6 mm image 37. Mild RIGHT hydronephrosis due to a 5 mm
proximal RIGHT ureteral calculus. Ureters otherwise decompressed
without calcification. No LEFT hydronephrosis or hydroureter.
Bladder unremarkable.

Stomach/Bowel: Normal appendix. Sigmoid diverticulosis without
evidence of diverticulitis. Question tiny hiatal hernia. Small
duodenal diverticulum. Stomach and bowel loops otherwise
unremarkable.

Vascular/Lymphatic: Atherosclerotic calcifications aorta and iliac
arteries without aneurysm. Coronary arterial calcification. No
adenopathy.

Reproductive: Prostatic enlargement, gland 5.4 x 3.9 cm image 85.
Seminal vesicles normal.

Other: BILATERAL inguinal hernias containing fat. No free air or
free fluid. Mild retroperitoneal lipomatosis.

Musculoskeletal: Bones demineralized with scattered degenerative
disc and facet disease changes of the lumbar spine. Probable
thoracolumbar ankylosis.
IMPRESSION: BILATERAL nonobstructing renal calculi and renal cysts.

Mild RIGHT hydronephrosis due to a 5 mm proximal RIGHT ureteral
calculus.

Please note that the hydronephrosis and proximal ureteral calculus
are on the RIGHT, whereas provided symptoms were LEFT-sided;
recommend correlation.

Prostatic enlargement.

Sigmoid diverticulosis.

BILATERAL inguinal hernias containing fat.

Aortic Atherosclerosis ([id number].O).

## 2020-12-13 ENCOUNTER — Other Ambulatory Visit: Payer: Self-pay | Admitting: Family Medicine

## 2020-12-13 ENCOUNTER — Other Ambulatory Visit (HOSPITAL_COMMUNITY): Payer: Self-pay

## 2020-12-13 DIAGNOSIS — M459 Ankylosing spondylitis of unspecified sites in spine: Secondary | ICD-10-CM

## 2020-12-13 MED ORDER — HYDROCODONE-ACETAMINOPHEN 10-325 MG PO TABS
1.0000 | ORAL_TABLET | Freq: Four times a day (QID) | ORAL | 0 refills | Status: DC | PRN
  Filled 2020-12-13: qty 88, 22d supply, fill #0
  Filled 2020-12-13: qty 32, 8d supply, fill #0
  Filled 2020-12-13: qty 120, 30d supply, fill #0

## 2020-12-13 MED FILL — Apixaban Tab 5 MG: ORAL | 15 days supply | Qty: 30 | Fill #8 | Status: AC

## 2020-12-15 ENCOUNTER — Other Ambulatory Visit (HOSPITAL_COMMUNITY): Payer: Self-pay

## 2020-12-16 ENCOUNTER — Ambulatory Visit: Payer: Medicare HMO | Admitting: Orthopaedic Surgery

## 2020-12-16 ENCOUNTER — Other Ambulatory Visit: Payer: Self-pay

## 2020-12-16 ENCOUNTER — Ambulatory Visit: Payer: Self-pay

## 2020-12-16 ENCOUNTER — Encounter: Payer: Self-pay | Admitting: Orthopaedic Surgery

## 2020-12-16 DIAGNOSIS — M170 Bilateral primary osteoarthritis of knee: Secondary | ICD-10-CM | POA: Diagnosis not present

## 2020-12-16 DIAGNOSIS — M1712 Unilateral primary osteoarthritis, left knee: Secondary | ICD-10-CM | POA: Diagnosis not present

## 2020-12-16 DIAGNOSIS — M25561 Pain in right knee: Secondary | ICD-10-CM

## 2020-12-16 DIAGNOSIS — M25562 Pain in left knee: Secondary | ICD-10-CM

## 2020-12-16 DIAGNOSIS — M1711 Unilateral primary osteoarthritis, right knee: Secondary | ICD-10-CM | POA: Diagnosis not present

## 2020-12-16 NOTE — Progress Notes (Signed)
Office Visit Note   Patient: [name] Woods           Date of Birth: 03-Apr-1940           MRN: [id number] Visit Date: 12/16/2020              Requested by: Vivi Barrack, MD [address] [place],  [place] [address] PCP: Vivi Barrack, MD   Assessment & Plan: Visit Diagnoses:  1. Unilateral primary osteoarthritis, right knee   2. Unilateral primary osteoarthritis, left knee     Plan: Impression is bilateral knee advanced degenerative joint disease.  Today, we discussed cortisone injections as well as viscosupplementation injection and total knee replacement surgery.  He is not interested in surgery at this point time and would like to proceed with bilateral cortisone injections today.  We will follow-up with [place] as needed.  Follow-Up Instructions: Return if symptoms worsen or fail to improve.   Orders:  Orders Placed This Encounter  Procedures   Large Joint Inj: bilateral knee   XR KNEE 3 VIEW RIGHT   XR KNEE 3 VIEW LEFT   No orders of the defined types were placed in this encounter.     Procedures: Large Joint Inj: bilateral knee on 12/16/2020 10:54 AM Indications: pain Details: 22 G needle, anterolateral approach Medications (Right): 0.66 mL bupivacaine 0.25 %; 3 mL lidocaine 1 %; 13.33 mg methylPREDNISolone acetate 40 MG/ML Medications (Left): 0.66 mL bupivacaine 0.25 %; 3 mL lidocaine 1 %; 13.33 mg methylPREDNISolone acetate 40 MG/ML     Clinical Data: No additional findings.   Subjective: Chief Complaint  Patient presents with   Right Knee - Pain   Left Knee - Pain    HPI patient is a pleasant 80 year old gentleman who comes in today with bilateral knee pain left greater than right for the past 20 to 30 years.  The pain he has to the entire aspect of both knees.  He describes this as a constant tooth ache worse with walking as well as at night when he is trying to sleep.  He has been doing chronic Norco for this as well as multiple other joint pains  which does not relieve his symptoms.  He has been seeing Dr. Junius Roads in the past for cortisone injections were performed with some relief in symptoms.  He is currently on Eliquis for A. fib.  Review of Systems as detailed in HPI.  All others June are negative.   Objective: Vital Signs: There were no vitals taken for this visit.  Physical Exam well-developed well-nourished gentleman in no acute distress.  Alert and oriented x3.  Ortho Exam bilateral knee exam shows a trace effusion both sides.  Range of motion 0 to 110 degrees.  Medial and lateral joint line tenderness.  Moderate patellofemoral crepitus.  He is neurovascular tact distally.  Specialty Comments:  No specialty comments available.  Imaging: XR KNEE 3 VIEW LEFT  Result Date: 12/16/2020 X-rays demonstrate significant tricompartmental degenerative changes  XR KNEE 3 VIEW RIGHT  Result Date: 12/16/2020 X-rays demonstrate significant tricompartmental degenerative changes    PMFS History: Patient Active Problem List   Diagnosis Date Noted   Osteoarthritis 04/15/2020   Gastric nodule    OSA (obstructive sleep apnea) 03/06/2020   Chronic atrial fibrillation ([place]) 02/12/2019   Kidney stones, calcium oxalate 12/03/2018   Hyperlipidemia 02/06/2018   Encounter for chronic pain management 03/01/2017   Hyperglycemia 04/23/2016   Chronic anticoagulation 03/02/2015   Gout 06/30/2014  CAD (coronary artery disease) 03/21/2012   GERD (gastroesophageal reflux disease) 03/21/2012   Essential hypertension 04/25/2007   Past Medical History:  Diagnosis Date   Ankylosing spondylitis ([place])    Anxiety and depression 09/12/2015   Aortic atherosclerosis (H[facility])    Arthritis    Bilateral inguinal hernia    CAD (coronary artery disease) 7169,6789   30% mid LAD lesion on cardiac cath   Chicken pox    Cholelithiasis    CHOLELITHIASIS, HX OF 04/25/2007   Qualifier: Diagnosis of  By: Marland Mcalpine     Diverticulosis 11/26/2018    Moderate, Left Noted on Colonoscopy   Dysrhythmia    atrial fibrillation   Gallstones    GERD (gastroesophageal reflux disease)    German measles    Hiatal hernia    History of colon polyps 11/26/2018   History of kidney stones    HLD (hyperlipidemia)    HTN (hypertension)    Hypertension    Nephrolithiasis    NEPHROLITHIASIS, HX OF 04/25/2007   Qualifier: Diagnosis of  By: Marland Mcalpine     Partial small bowel obstruction ([place]) 03/06/2020   Persistent atrial fibrillation (HCC)    Sleep apnea    No CPAP   Tubular adenoma of colon 03/1993   Ventricular hypertrophy     Family History  Problem Relation Age of Onset   Heart disease Father 65       Deceased   Heart attack Father 73   Heart disease Brother 37       "Died in his sleep"   Arthritis/Rheumatoid Mother        Deceased-86   Hyperlipidemia Brother    Stroke Brother 51       Deceased   Other Sister        Estate agent in Home   Other Sister        MVA   Other Brother        Carjacked-killed   Dementia Paternal Grandmother    Cancer Paternal Uncle    Healthy Brother    Healthy Son        X3   Healthy Daughter        x1   Colon cancer Neg Hx    Esophageal cancer Neg Hx    Stomach cancer Neg Hx    Rectal cancer Neg Hx     Past Surgical History:  Procedure Laterality Date   BIOPSY  03/08/2020   Procedure: BIOPSY;  Surgeon: Yetta Flock, MD;  Location: [facility];  Service: Gastroenterology;;   CARDIAC CATHETERIZATION  03/2012   30% mid LAD lesion otherwise normal cors, LVEF 65%   CHOLECYSTECTOMY     COLONOSCOPY  11/26/2018   CYSTOSCOPY WITH RETROGRADE PYELOGRAM, URETEROSCOPY AND STENT PLACEMENT Right 01/02/2019   Procedure: CYSTOSCOPY WITH RETROGRADE PYELOGRAM, URETEROSCOPY AND STENT PLACEMENT;  Surgeon: Alexis Frock, MD;  Location: [facility];  Service: Urology;  Laterality: Right;  1 HR   ELBOW ARTHROPLASTY Left    ESOPHAGOGASTRODUODENOSCOPY (EGD) WITH PROPOFOL N/A 03/08/2020   Procedure:  ESOPHAGOGASTRODUODENOSCOPY (EGD) WITH PROPOFOL;  Surgeon: Yetta Flock, MD;  Location: [facility];  Service: Gastroenterology;  Laterality: N/A;   HAND SURGERY Left    HOLMIUM LASER APPLICATION Right 38/01/173   Procedure: HOLMIUM LASER APPLICATION;  Surgeon: Alexis Frock, MD;  Location: [facility];  Service: Urology;  Laterality: Right;   KNEE SURGERY     x2 right   LEFT AND RIGHT HEART CATHETERIZATION WITH CORONARY ANGIOGRAM N/A 03/24/2012  Procedure: LEFT AND RIGHT HEART CATHETERIZATION WITH CORONARY ANGIOGRAM;  Surgeon: Sherren Mocha, MD;  Location[facility];  Service: Cardiovascular;  Laterality: N/A;   LEG FLUID REMOVAL RIGHT     TONSILLECTOMY     UPPER GI ENDOSCOPY     Social History   Occupational History   Occupation: Retired    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  Tobacco Use   Smoking status: Never   Smokeless tobacco: Never  Vaping Use   Vaping Use: Never used  Substance and Sexual Activity   Alcohol use: No   Drug use: No   Sexual activity: Yes

## 2020-12-17 MED ORDER — BUPIVACAINE HCL 0.25 % IJ SOLN
0.6600 mL | INTRAMUSCULAR | Status: AC | PRN
  Administered 2020-12-16: .66 mL via INTRA_ARTICULAR

## 2020-12-17 MED ORDER — LIDOCAINE HCL 1 % IJ SOLN
3.0000 mL | INTRAMUSCULAR | Status: AC | PRN
  Administered 2020-12-16: 3 mL

## 2020-12-17 MED ORDER — METHYLPREDNISOLONE ACETATE 40 MG/ML IJ SUSP
13.3300 mg | INTRAMUSCULAR | Status: AC | PRN
Start: 2020-12-16 — End: 2020-12-16
  Administered 2020-12-16: 13.33 mg via INTRA_ARTICULAR

## 2020-12-17 MED ORDER — LIDOCAINE HCL 1 % IJ SOLN
3.0000 mL | INTRAMUSCULAR | Status: AC | PRN
Start: 2020-12-16 — End: 2020-12-16
  Administered 2020-12-16: 3 mL

## 2020-12-19 ENCOUNTER — Ambulatory Visit: Payer: Medicare HMO | Admitting: Family Medicine

## 2020-12-21 ENCOUNTER — Other Ambulatory Visit: Payer: Self-pay

## 2020-12-21 ENCOUNTER — Telehealth: Payer: Self-pay | Admitting: Cardiology

## 2020-12-21 NOTE — Telephone Encounter (Signed)
Medication refused

## 2020-12-21 NOTE — Telephone Encounter (Signed)
*  STAT* If patient is at the pharmacy, call can be transferred to refill team.   1. Which medications need to be refilled? (please list name of each medication and dose if known)  cyclobenzaprine (FLEXERIL) 10 MG tablet  2. Which pharmacy/location (including street and city if local pharmacy) is medication to be sent to? [place], [place]  3. Do they need a 30 day or 90 day supply? 90 ds

## 2020-12-29 ENCOUNTER — Other Ambulatory Visit: Payer: Self-pay

## 2020-12-29 MED ORDER — CYCLOBENZAPRINE HCL 10 MG PO TABS
10.0000 mg | ORAL_TABLET | Freq: Three times a day (TID) | ORAL | 3 refills | Status: DC | PRN
Start: 2020-12-29 — End: 2021-03-09

## 2021-01-13 ENCOUNTER — Other Ambulatory Visit (HOSPITAL_COMMUNITY): Payer: Self-pay

## 2021-01-13 ENCOUNTER — Other Ambulatory Visit: Payer: Self-pay | Admitting: Family Medicine

## 2021-01-13 DIAGNOSIS — M459 Ankylosing spondylitis of unspecified sites in spine: Secondary | ICD-10-CM

## 2021-01-13 MED ORDER — HYDROCODONE-ACETAMINOPHEN 10-325 MG PO TABS
1.0000 | ORAL_TABLET | Freq: Four times a day (QID) | ORAL | 0 refills | Status: DC | PRN
  Filled 2021-01-13: qty 120, 30d supply, fill #0

## 2021-01-13 MED FILL — Apixaban Tab 5 MG: ORAL | 5 days supply | Qty: 10 | Fill #9 | Status: AC

## 2021-01-24 ENCOUNTER — Other Ambulatory Visit (HOSPITAL_COMMUNITY): Payer: Self-pay

## 2021-01-24 MED FILL — Apixaban Tab 5 MG: ORAL | 10 days supply | Qty: 20 | Fill #10 | Status: AC

## 2021-02-13 ENCOUNTER — Other Ambulatory Visit (HOSPITAL_COMMUNITY): Payer: Self-pay

## 2021-02-13 ENCOUNTER — Other Ambulatory Visit: Payer: Self-pay | Admitting: Family Medicine

## 2021-02-13 DIAGNOSIS — M459 Ankylosing spondylitis of unspecified sites in spine: Secondary | ICD-10-CM

## 2021-02-13 MED ORDER — HYDROCODONE-ACETAMINOPHEN 10-325 MG PO TABS
1.0000 | ORAL_TABLET | Freq: Four times a day (QID) | ORAL | 0 refills | Status: DC | PRN
  Filled 2021-02-13: qty 120, 30d supply, fill #0

## 2021-02-13 MED FILL — Apixaban Tab 5 MG: ORAL | 17 days supply | Qty: 35 | Fill #11 | Status: AC

## 2021-02-16 ENCOUNTER — Ambulatory Visit: Payer: Medicare HMO | Admitting: Cardiology

## 2021-02-16 ENCOUNTER — Other Ambulatory Visit: Payer: Self-pay

## 2021-02-16 ENCOUNTER — Encounter: Payer: Self-pay | Admitting: Cardiology

## 2021-02-16 VITALS — BP 124/70 | HR 58 | Ht 72.0 in | Wt 202.0 lb

## 2021-02-16 DIAGNOSIS — I2510 Atherosclerotic heart disease of native coronary artery without angina pectoris: Secondary | ICD-10-CM | POA: Diagnosis not present

## 2021-02-16 DIAGNOSIS — I482 Chronic atrial fibrillation, unspecified: Secondary | ICD-10-CM | POA: Diagnosis not present

## 2021-02-16 DIAGNOSIS — N179 Acute kidney failure, unspecified: Secondary | ICD-10-CM | POA: Diagnosis not present

## 2021-02-16 MED ORDER — APIXABAN 5 MG PO TABS: ORAL_TABLET | Freq: Two times a day (BID) | ORAL | 0 refills | Status: DC

## 2021-02-16 NOTE — Progress Notes (Signed)
Cardiology Office Note   Date:  02/16/2021   ID:  [name] Woods, DOB Jun 02, 1940, MRN [id number]  PCP:  Vivi Barrack, MD  Cardiologist:   Minus Breeding, MD   Chief Complaint  Patient presents with   Atrial Fibrillation      History of Present Illness: [name] Woods is a 81 y.o. male who with chronic atrial fibrillation, minimal CAD and hypertension.  He had a cardiac catheterization on 03/24/2012 which showed very mild nonobstructive CAD.  EF was mildly reduced to 45% the time.  Repeat echocardiogram in June 2015 showed an EF improved to 55%.  ETT in March 2018 showed ST segment depressions in inferolateral leads, poor exercise tolerance.  Echocardiogram in March 2018 showed EF 55 to 60%, mild AI, small posterior pericardial effusion.  He did have 2 episode of presyncope and was seen in September 2018, it was decided to watch and wait to see if he has any more episodes.  His diltiazem was increased at the time.   Since we last talked to him he has had no new cardiovascular complaints. The patient denies any new symptoms such as chest discomfort, neck or arm discomfort. There has been no new shortness of breath, PND or orthopnea. There have been no reported palpitations, presyncope or syncope.   He is somewhat limited by knee pain.  He can use a buggy at the grocery store and walk around.  With this level of activity he does not have any cardiovascular symptoms.  Past Medical History:  Diagnosis Date   Ankylosing spondylitis ([place])    Anxiety and depression 09/12/2015   Aortic atherosclerosis (HCC)    Arthritis    Bilateral inguinal hernia    CAD (coronary artery disease) 4270,6237   30% mid LAD lesion on cardiac cath   Chicken pox    Cholelithiasis    CHOLELITHIASIS, HX OF 04/25/2007   Qualifier: Diagnosis of  By: Marland Mcalpine     Diverticulosis 11/26/2018   Moderate, Left Noted on Colonoscopy   Dysrhythmia    atrial fibrillation   Gallstones    GERD  (gastroesophageal reflux disease)    German measles    Hiatal hernia    History of colon polyps 11/26/2018   History of kidney stones    HLD (hyperlipidemia)    HTN (hypertension)    Hypertension    Nephrolithiasis    NEPHROLITHIASIS, HX OF 04/25/2007   Qualifier: Diagnosis of  By: Marland Mcalpine     Partial small bowel obstruction ([place]) 03/06/2020   Persistent atrial fibrillation ([place])    Sleep apnea    No CPAP   Tubular adenoma of colon 03/1993   Ventricular hypertrophy     Past Surgical History:  Procedure Laterality Date   BIOPSY  03/08/2020   Procedure: BIOPSY;  Surgeon: Yetta Flock, MD;  Location: [facility];  Service: Gastroenterology;;   CARDIAC CATHETERIZATION  03/2012   30% mid LAD lesion otherwise normal cors, LVEF 65%   CHOLECYSTECTOMY     COLONOSCOPY  11/26/2018   CYSTOSCOPY WITH RETROGRADE PYELOGRAM, URETEROSCOPY AND STENT PLACEMENT Right 01/02/2019   Procedure: CYSTOSCOPY WITH RETROGRADE PYELOGRAM, URETEROSCOPY AND STENT PLACEMENT;  Surgeon: Alexis Frock, MD;  Location[facility];  Service: Urology;  Laterality: Right;  1 HR   ELBOW ARTHROPLASTY Left    ESOPHAGOGASTRODUODENOSCOPY (EGD) WITH PROPOFOL N/A 03/08/2020   Procedure: ESOPHAGOGASTRODUODENOSCOPY (EGD) WITH PROPOFOL;  Surgeon: Yetta Flock, MD;  Location: [place];  Service: Gastroenterology;  Laterality: N/A;   HAND SURGERY Left    HOLMIUM LASER APPLICATION Right 38/03/5051   Procedure: HOLMIUM LASER APPLICATION;  Surgeon: Alexis Frock, MD;  Location[facility];  Service: Urology;  Laterality: Right;   KNEE SURGERY     x2 right   LEFT AND RIGHT HEART CATHETERIZATION WITH CORONARY ANGIOGRAM N/A 03/24/2012   Procedure: LEFT AND RIGHT HEART CATHETERIZATION WITH CORONARY ANGIOGRAM;  Surgeon: Sherren Mocha, MD;  Location: [facility];  Service: Cardiovascular;  Laterality: N/A;   LEG FLUID REMOVAL RIGHT     TONSILLECTOMY     UPPER GI ENDOSCOPY       Current Outpatient Medications   Medication Sig Dispense Refill   allopurinol (ZYLOPRIM) 100 MG tablet TAKE 1 TABLET EVERY DAY 90 tablet 1   atorvastatin (LIPITOR) 10 MG tablet TAKE 1 TABLET EVERY DAY 90 tablet 1   ciclopirox (PENLAC) 8 % solution Apply topically at bedtime. Apply over nail and surrounding skin. Apply daily over previous coat. After seven (7) days, may remove with alcohol and continue cycle. 6.6 mL 1   cyclobenzaprine (FLEXERIL) 10 MG tablet Take 1 tablet (10 mg total) by mouth 3 (three) times daily as needed for muscle spasms. 30 tablet 3   diltiazem (CARDIZEM CD) 240 MG 24 hr capsule Take 1 capsule (240 mg total) by mouth daily. 90 capsule 3   furosemide (LASIX) 20 MG tablet Take 1 tablet (20 mg total) by mouth daily. 90 tablet 3   HYDROcodone-acetaminophen (NORCO) 10-325 MG tablet Take 1 tablet by mouth every 6 (six) hours as needed. 120 tablet 0   metoprolol succinate (TOPROL-XL) 100 MG 24 hr tablet TAKE 1 TABLET  TWO TIMES DAILY  WITH OR IMMEDIATELY FOLLOWING A MEAL. 180 tablet 3   apixaban (ELIQUIS) 5 MG TABS tablet TAKE 1 TABLET BY MOUTH 2 TIMES DAILY. 42 tablet 0   No current facility-administered medications for this visit.    Allergies:   Coumadin [warfarin]    ROS:  Please see the history of present illness.   Otherwise, review of systems are positive for none.   All other systems are reviewed and negative.    PHYSICAL EXAM: VS:  BP 124/70    Pulse (!) 58    Ht 6' (1.829 m)    Wt 202 lb (91.6 kg)    BMI 27.40 kg/m  , BMI Body mass index is 27.4 kg/m. GENERAL:  Well appearing NECK:  No jugular venous distention, waveform within normal limits, carotid upstroke brisk and symmetric, no bruits, no thyromegaly LUNGS:  Clear to auscultation bilaterally CHEST:  Unremarkable HEART:  PMI not displaced or sustained,S1 and S2 within normal limits, no S3,  no clicks, no rubs, no murmurs, irregular ABD:  Flat, positive bowel sounds normal in frequency in pitch, no bruits, no rebound, no guarding, no  midline pulsatile mass, no hepatomegaly, no splenomegaly EXT:  2 plus pulses throughout, no edema, no cyanosis no clubbing   EKG:  EKG is  ordered today. Atrial fibrillation, rate 58, premature ventricular contractions, nonspecific inferolateral ST depression unchanged from previous, premature ventricular contractions  Recent Labs: 03/09/2020: Magnesium 1.9 04/15/2020: TSH 2.45 08/10/2020: ALT 12; BUN 17; Creatinine, Ser 1.33; Hemoglobin 16.1; Platelets 207.0; Potassium 4.0; Sodium 143    Lipid Panel    Component Value Date/Time   CHOL 115 02/18/2020 0953   TRIG 94 02/18/2020 0953   HDL 39 (L) 02/18/2020 0953   CHOLHDL 2.9 02/18/2020 0953   CHOLHDL 4 03/02/2016 1016   VLDL  20.8 03/02/2016 1016   LDLCALC 58 02/18/2020 0953      Wt Readings from Last 3 Encounters:  02/16/21 202 lb (91.6 kg)  09/14/20 202 lb 9.6 oz (91.9 kg)  07/14/20 198 lb 9.6 oz (90.1 kg)      Other studies Reviewed: Additional studies/ records that were reviewed today include: Labs. Review of the above records demonstrates:  Please see elsewhere in the note.     ASSESSMENT AND PLAN:  CAD:   The patient has nonobstructive disease and no new symptoms.  No change in therapy.  Chronic atrial fibrillation:    [name] has a CHA2DS2 - VASc score of 3.   He tolerates anticoagulation and has had reasonable rate control.  He is on the correct dose he is up-to-date with blood work.  AKI:    Creatinine was 1.33 this summer.  No change in therapy.    HTN:  The blood pressure is at target. No change in medications is indicated. We will continue with therapeutic lifestyle changes (TLC).    Current medicines are reviewed at length with the patient today.  The patient does not have concerns regarding medicines.  The following changes have been made:  None  Labs/ tests ordered today include:   Orders Placed This Encounter  Procedures   EKG 12-Lead     Disposition:   FU with me in one year.     Signed, Minus Breeding, MD  02/16/2021 10:43 AM    [facility]

## 2021-02-16 NOTE — Patient Instructions (Signed)
Medication Instructions:  Your Physician recommend you continue on your current medication as directed.   Eliquis samples 3 boxes given.  *If you need a refill on your cardiac medications before your next appointment, please call your pharmacy*   Follow-Up: At [facility], you and your health needs are our priority.  As part of our continuing mission to provide you with exceptional heart care, we have created designated Provider Care Teams.  These Care Teams include your primary Cardiologist (physician) and Advanced Practice Providers (APPs -  Physician Assistants and Nurse Practitioners) who all work together to provide you with the care you need, when you need it.  We recommend signing up for the patient portal called "MyChart".  Sign up information is provided on this After Visit Summary.  MyChart is used to connect with patients for Virtual Visits (Telemedicine).  Patients are able to view lab/test results, encounter notes, upcoming appointments, etc.  Non-urgent messages can be sent to your provider as well.   To learn more about what you can do with MyChart, go to [email or web address].    Your next appointment:   1 year(s)  The format for your next appointment:   In Person  Provider:   Minus Breeding, MD

## 2021-02-17 ENCOUNTER — Ambulatory Visit: Payer: Medicare HMO | Admitting: Cardiology

## 2021-03-09 ENCOUNTER — Other Ambulatory Visit: Payer: Self-pay

## 2021-03-09 ENCOUNTER — Other Ambulatory Visit (HOSPITAL_COMMUNITY): Payer: Self-pay

## 2021-03-09 ENCOUNTER — Telehealth (INDEPENDENT_AMBULATORY_CARE_PROVIDER_SITE_OTHER): Payer: Medicare HMO | Admitting: Family Medicine

## 2021-03-09 DIAGNOSIS — J329 Chronic sinusitis, unspecified: Secondary | ICD-10-CM | POA: Diagnosis not present

## 2021-03-09 DIAGNOSIS — M1990 Unspecified osteoarthritis, unspecified site: Secondary | ICD-10-CM | POA: Diagnosis not present

## 2021-03-09 DIAGNOSIS — I10 Essential (primary) hypertension: Secondary | ICD-10-CM | POA: Diagnosis not present

## 2021-03-09 MED ORDER — CYCLOBENZAPRINE HCL 10 MG PO TABS
10.0000 mg | ORAL_TABLET | Freq: Three times a day (TID) | ORAL | 3 refills | Status: DC | PRN
Start: 2021-03-09 — End: 2021-11-22
  Filled 2021-03-09: qty 30, 10d supply, fill #0
  Filled 2021-04-12: qty 30, 10d supply, fill #1
  Filled 2021-08-16: qty 30, 10d supply, fill #2
  Filled 2021-09-13: qty 30, 10d supply, fill #3

## 2021-03-09 MED ORDER — AZITHROMYCIN 250 MG PO TABS
ORAL_TABLET | ORAL | 0 refills | Status: DC
  Filled 2021-03-09: qty 6, 5d supply, fill #0

## 2021-03-09 MED ORDER — AZELASTINE HCL 0.1 % NA SOLN
2.0000 | Freq: Two times a day (BID) | NASAL | 12 refills | Status: DC
  Filled 2021-03-09: qty 30, 25d supply, fill #0
  Filled 2021-04-12: qty 30, 25d supply, fill #1
  Filled 2021-05-19 (×2): qty 30, 25d supply, fill #2
  Filled 2021-11-22: qty 30, 25d supply, fill #3

## 2021-03-09 NOTE — Assessment & Plan Note (Signed)
At goal on diltiazem 240 mg daily and metoprolol succinate 100 mg twice daily.

## 2021-03-09 NOTE — Progress Notes (Signed)
° °  [name] Woods is a 81 y.o. male who presents today for a virtual office visit.  Assessment/Plan:  New/Acute Problems: Sinusitis No red flags.  Will start Astelin.  Sent in prescription for azithromycin.  Encouraged hydration.  He can continue over-the-counter meds.  He will let me know if not improving.  Discussed reasons to return to care.  Chronic Problems Addressed Today: Essential hypertension At goal on diltiazem 240 mg daily and metoprolol succinate 100 mg twice daily.  Osteoarthritis We will refill Flexeril.  He is also on Norco 10-325 every 6 hours as needed.  Does not need refill today.     Subjective:  HPI:  Patient with cough and congestion for the last 5 days. Associated with fatigue and headache. Tried taking OTC medications with modest improvement. No known sick contacts. Home covid test was negative a few days ago. No fevers or chills.        Objective/Observations  Physical Exam: Gen: NAD, resting comfortably Pulm: Normal work of breathing Neuro: Grossly normal, moves all extremities Psych: Normal affect and thought content  Virtual Visit via Video   I connected with [name] Woods on 03/09/21 at  8:40 AM EST by a video enabled telemedicine application and verified that I am speaking with the correct person using two identifiers. The limitations of evaluation and management by telemedicine and the availability of in person appointments were discussed. The patient expressed understanding and agreed to proceed.   Patient location: Home Provider location: [place] participating in the virtual visit: Myself and Patient     Algis Greenhouse. [name], MD 03/09/2021 8:57 AM

## 2021-03-09 NOTE — Assessment & Plan Note (Signed)
We will refill Flexeril.  He is also on Norco 10-325 every 6 hours as needed.  Does not need refill today.

## 2021-03-15 ENCOUNTER — Other Ambulatory Visit: Payer: Self-pay | Admitting: Family Medicine

## 2021-03-15 ENCOUNTER — Other Ambulatory Visit (HOSPITAL_COMMUNITY): Payer: Self-pay

## 2021-03-15 ENCOUNTER — Other Ambulatory Visit: Payer: Self-pay | Admitting: Cardiology

## 2021-03-15 DIAGNOSIS — M459 Ankylosing spondylitis of unspecified sites in spine: Secondary | ICD-10-CM

## 2021-03-15 MED ORDER — APIXABAN 5 MG PO TABS
5.0000 mg | ORAL_TABLET | Freq: Two times a day (BID) | ORAL | 1 refills | Status: DC
  Filled 2021-03-15: qty 30, 15d supply, fill #0

## 2021-03-15 NOTE — Telephone Encounter (Signed)
Prescription refill request for Eliquis received. Indication:Afib Last office visit:1/23 Scr:1.3 Age: 81 Weight:91.6 kg  Prescription refilled

## 2021-03-15 NOTE — Telephone Encounter (Signed)
The original prescription was discontinued on 07/14/2020 by Kennyth Arnold, FNP. Renewing this prescription may not be appropriate.

## 2021-03-16 ENCOUNTER — Other Ambulatory Visit (HOSPITAL_COMMUNITY): Payer: Self-pay

## 2021-03-16 MED ORDER — SILDENAFIL CITRATE 20 MG PO TABS
ORAL_TABLET | ORAL | 0 refills | Status: DC
  Filled 2021-03-16: qty 30, 45d supply, fill #0

## 2021-03-16 MED ORDER — HYDROCODONE-ACETAMINOPHEN 10-325 MG PO TABS
1.0000 | ORAL_TABLET | Freq: Four times a day (QID) | ORAL | 0 refills | Status: DC | PRN
  Filled 2021-03-16: qty 120, 30d supply, fill #0

## 2021-03-21 ENCOUNTER — Telehealth: Payer: Self-pay | Admitting: Family Medicine

## 2021-03-21 NOTE — Chronic Care Management (AMB) (Signed)
°  Chronic Care Management   Note  03/21/2021 Name: [name] Woods MRN: [id number] DOB: 10-25-1940  [name] Woods is a 81 y.o. year old male who is a primary care patient of Vivi Barrack, MD. I reached out to [name] by phone today in response to a referral sent by [name]'s PCP, Vivi Barrack, MD.   [name] was given information about Chronic Care Management services today including:  CCM service includes personalized support from designated clinical staff supervised by his physician, including individualized plan of care and coordination with other care providers 24/7 contact phone numbers for assistance for urgent and routine care needs. Service will only be billed when office clinical staff spend 20 minutes or more in a month to coordinate care. Only one practitioner may furnish and bill the service in a calendar month. The patient may stop CCM services at any time (effective at the end of the month) by phone call to the office staff.   Patient agreed to services and verbal consent obtained.   Follow up plan: PT IS SCHEDULED FOR INITIAL TELEVISIT 03/31/2021@11AM    Tatjana Dellinger Upstream Scheduler

## 2021-03-29 ENCOUNTER — Telehealth: Payer: Self-pay | Admitting: Pharmacist

## 2021-03-29 NOTE — Progress Notes (Signed)
? ? ?Chronic Care Management ?Pharmacy Assistant  ? ?Name: [name] Woods  MRN: [id number] DOB: [birth year] ? ? ?Reason for Encounter: Chart Review For Initial Visit With Clinical Pharmacist ?  ?Conditions to be addressed/monitored: ?HTN, CAD, Chronic Atrial Fibrillation, GERD, HLD, Hyperglycemia, Chronic Pain ? ?Primary concerns for visit include: ?HTN, Atrial Fibrillation, Chronic Pain  ? ?Recent office visits:  ?03/09/2021 VV (PCP) Vivi Barrack, MD; Will start Astelin.  Sent in prescription for azithromycin ? ?10/25/2020 VV Inda Coke, PA; Will start oral azithromycin and oral prednisone to cover for early bacterial upper respiratory infection ? ?Recent consult visits:  ?02/16/2021 OV (Cardiology) Minus Breeding, MD; no medication changes indicated. ? ?12/16/2020 OV (Orthopedics) Leandrew Koyanagi, MD; no medication changes indicated. ? ?Hospital visits:  ?None in previous 6 months ? ?Medications: ?Outpatient Encounter Medications as of 03/29/2021  ?Medication Sig  ? allopurinol (ZYLOPRIM) 100 MG tablet TAKE 1 TABLET EVERY DAY  ? apixaban (ELIQUIS) 5 MG TABS tablet TAKE 1 TABLET BY MOUTH 2 TIMES DAILY.  ? apixaban (ELIQUIS) 5 MG TABS tablet Take 1 tablet (5 mg total) by mouth 2 (two) times daily.  ? atorvastatin (LIPITOR) 10 MG tablet TAKE 1 TABLET EVERY DAY  ? azelastine (ASTELIN) 0.1 % nasal spray Place 2 sprays into both nostrils 2 (two) times daily.  ? azithromycin (ZITHROMAX) 250 MG tablet Take 2 tablets by mouth on day 1, then take 1 tablet daily.  ? ciclopirox (PENLAC) 8 % solution Apply topically at bedtime. Apply over nail and surrounding skin. Apply daily over previous coat. After seven (7) days, may remove with alcohol and continue cycle.  ? cyclobenzaprine (FLEXERIL) 10 MG tablet Take 1 tablet (10 mg total) by mouth 3 (three) times daily as needed for muscle spasms.  ? diltiazem (CARDIZEM CD) 240 MG 24 hr capsule Take 1 capsule (240 mg total) by mouth daily.  ? furosemide (LASIX) 20 MG tablet  Take 1 tablet (20 mg total) by mouth daily.  ? HYDROcodone-acetaminophen (NORCO) 10-325 MG tablet Take 1 tablet by mouth every 6 (six) hours as needed.  ? metoprolol succinate (TOPROL-XL) 100 MG 24 hr tablet TAKE 1 TABLET  TWO TIMES DAILY  WITH OR IMMEDIATELY FOLLOWING A MEAL.  ? sildenafil (REVATIO) 20 MG tablet TAKE 1 TABLET BY MOUTH AS NEEDED FOR ERECTILE DYSFUNCTION. DO NO TAKE MORE THAN 1 DOSE IN 36 HOURS.  ? [DISCONTINUED] pantoprazole (PROTONIX) 40 MG tablet Take 1 tablet (40 mg total) by mouth daily.  ? ?No facility-administered encounter medications on file as of 03/29/2021.  ? ?Current Medications: ?Hydrocodone-acetaminophen 10-325 mg last filled 03/16/2021 30 DS ?Sildenafil 20 mg last filled 03/16/2021 45 DS ?Eliquis 5 mg last filled 03/16/2021 15 DS ?Cyclobenzaprine 10 last filled 03/09/2021 10 DS ?Azelastine 0.1% last filled 03/09/2021 25 DS ?Atorvastatin 10 mg last filled 02/25/2021 90 DS ?Allopurinol 100 mg last filled 02/25/2021 90 DS ?Ciclopirox 8% solution last filled 07/15/2020 30 DS ?Furosemide 20 mg last filled 02/03/2021 90 DS ?Diltiazem 240 mg last filled 02/03/2021 90 DS ?Metoprolol Succinate 100 mg last filled 02/25/2021 90 DS ? ?Patient Questions: ?Any changes in your medications or health? ?Patient denies any recent changes in his medications or health. ? ?Any side effects from any medications?  ?Patient denies any side effects from any of his medications. ? ?Do you have any symptoms or problems not managed by your medications? ?Patient denies having any symptoms or problems that are not managed by his medications. ? ?Any concerns about your health right now? ?"  Just some aches and pains." ? ?Has your provider asked that you check blood pressure, blood sugar, or follow special diet at home? ?Patient checks his blood pressure occasionally. He does not check blood sugar. He does not follow any special diet. ? ?Do you get any type of exercise on a regular basis? ?Patient states he tries to do as  much as he can. He states it's hard to walk due to knee pain. ? ?Can you think of a goal you would like to reach for your health? ?"I would like to walk better." ? ?Do you have any problems getting your medications? ?Patient states his Eliquis $45/month. He is interested in applying for patient assistance. Patient aware an application will be mailed out to him next week. ? ?Is there anything that you would like to discuss during the appointment?  ?Patient states he does not have anything he would like to discuss. ? ?Please bring medications and supplements to appointment ? ?Care Gaps: ?Medicare Annual Wellness: Completed 07/25/2020 ?Hemoglobin A1C: 5.9% on 03/07/2021 ?Colonoscopy: Completed 11/26/2018 ? ?Future Appointments  ?Date Time Provider [place]  ?03/31/2021 11:00 AM [facility] CCM PHARMACIST [facility]  ?07/28/2021 10:15 AM [facility] HEALTH COACH [facility]  ? ?Star Rating Drugs: ?Atorvastatin 10 mg last filled 02/25/2021 90 DS ? ?April D Calhoun, [place] ?Clinical Pharmacist Assistant ?[phone]  ?

## 2021-03-30 NOTE — Progress Notes (Signed)
Chronic Care Management Pharmacy Note  03/31/2021 Name:  [name] Woods MRN:  [id number] DOB:  1940/07/13  Summary: Initial visit with PharmD.  Having a few more gout attacks recently.  Admits to increased soda intake.  Eating red meats.  Recommendations/Changes made from today's visit: If gout flares increase could increase allopurinol Recheck lipids/A1c at June visit  Plan: FU 6 months   Subjective: [name] Woods is an 81 y.o. year old male who is a primary patient of Vivi Barrack, MD.  The CCM team was consulted for assistance with disease management and care coordination needs.    Engaged with patient by telephone for follow up visit in response to provider referral for pharmacy case management and/or care coordination services.   Consent to Services:  The patient was given the following information about Chronic Care Management services today, agreed to services, and gave verbal consent: 1. CCM service includes personalized support from designated clinical staff supervised by the primary care provider, including individualized plan of care and coordination with other care providers 2. 24/7 contact phone numbers for assistance for urgent and routine care needs. 3. Service will only be billed when office clinical staff spend 20 minutes or more in a month to coordinate care. 4. Only one practitioner may furnish and bill the service in a calendar month. 5.The patient may stop CCM services at any time (effective at the end of the month) by phone call to the office staff. 6. The patient will be responsible for cost sharing (co-pay) of up to 20% of the service fee (after annual deductible is met). Patient agreed to services and consent obtained.  Patient Care Team: Vivi Barrack, MD as PCP - General (Family Medicine) Minus Breeding, MD as PCP - Cardiology (Cardiology) Kathie Rhodes, MD (Inactive) as Consulting Physician (Urology) Minus Breeding, MD as Consulting Physician  (Cardiology) Edythe Clarity, [facility] as Pharmacist (Pharmacist)  Recent office visits:  03/09/2021 VV (PCP) Vivi Barrack, MD; Will start Astelin.  Sent in prescription for azithromycin   10/25/2020 VV Inda Coke, PA; Will start oral azithromycin and oral prednisone to cover for early bacterial upper respiratory infection   Recent consult visits:  02/16/2021 OV (Cardiology) Minus Breeding, MD; no medication changes indicated.   12/16/2020 OV (Orthopedics) Leandrew Koyanagi, MD; no medication changes indicated.   Hospital visits:  None in previous 6 months  Objective:  Lab Results  Component Value Date   CREATININE 1.33 08/10/2020   BUN 17 08/10/2020   GFR 50.84 (L) 08/10/2020   GFRNONAA >60 03/09/2020   GFRAA 57 (L) 02/18/2020   NA 143 08/10/2020   K 4.0 08/10/2020   CALCIUM 9.2 08/10/2020   CO2 30 08/10/2020   GLUCOSE 148 (H) 08/10/2020    Lab Results  Component Value Date/Time   HGBA1C 5.9 (H) 03/07/2020 12:23 AM   HGBA1C 5.6 03/02/2016 10:16 AM   GFR 50.84 (L) 08/10/2020 02:05 PM   GFR 53.86 (L) 04/15/2020 11:12 AM    Last diabetic Eye exam: No results found for: HMDIABEYEEXA  Last diabetic Foot exam: No results found for: HMDIABFOOTEX   Lab Results  Component Value Date   CHOL 115 02/18/2020   HDL 39 (L) 02/18/2020   LDLCALC 58 02/18/2020   TRIG 94 02/18/2020   CHOLHDL 2.9 02/18/2020    Hepatic Function Latest Ref Rng & Units 08/10/2020 04/15/2020 03/16/2020  Total Protein 6.0 - 8.3 g/dL 6.4 6.6 6.7  Albumin 3.5 - 5.2 g/dL 3.9 4.1  3.8  AST 0 - 37 U/L _0 ALT 0 - 53 U/L _1 Alk Phosphatase 39 - 117 U/L 66 87 73  Total Bilirubin 0.2 - 1.2 mg/dL 2.5(H) 2.3(H) 1.9(H)  Bilirubin, Direct 0.00 - 0.40 mg/dL - - -    Lab Results  Component Value Date/Time   TSH 2.45 04/15/2020 11:12 AM   TSH 2.291 03/06/2020 05:37 PM   TSH 2.770 10/17/2016 10:46 AM   FREET4 1.19 02/05/2011 12:36 PM   FREET4 3.1 (H) 06/30/2009 10:37 AM    CBC Latest Ref  Rng & Units 08/10/2020 04/15/2020 03/16/2020  WBC 4.0 - 10.5 K/uL 7.8 8.1 9.0  Hemoglobin 13.0 - 17.0 g/dL 16.1 16.7 15.9  Hematocrit 39.0 - 52.0 % 47.3 49.9 47.5  Platelets 150.0 - 400.0 K/uL 207.0 201.0 226.0    No results found for: VD25OH  Clinical ASCVD: No  The ASCVD Risk score (Arnett DK, et al., 2019) failed to calculate for the following reasons:   The 2019 ASCVD risk score is only valid for ages 54 to 39    Depression screen PHQ 2/9 09/14/2020 07/25/2020 02/11/2020  Decreased Interest 0 0 0  Down, Depressed, Hopeless 0 0 0  PHQ - 2 Score 0 0 0  Altered sleeping - - -  Tired, decreased energy - - -  Change in appetite - - -  Feeling bad or failure about yourself  - - -  Trouble concentrating - - -  Moving slowly or fidgety/restless - - -  Suicidal thoughts - - -  PHQ-9 Score - - -  Difficult doing work/chores - - -  Some recent data might be hidden   )  Social History   Tobacco Use  Smoking Status Never  Smokeless Tobacco Never   BP Readings from Last 3 Encounters:  02/16/21 124/70  10/25/20 (!) 165/78  09/14/20 (!) 127/57   Pulse Readings from Last 3 Encounters:  02/16/21 (!) 58  10/25/20 70  09/14/20 62   Wt Readings from Last 3 Encounters:  02/16/21 202 lb (91.6 kg)  09/14/20 202 lb 9.6 oz (91.9 kg)  07/14/20 198 lb 9.6 oz (90.1 kg)   BMI Readings from Last 3 Encounters:  02/16/21 27.40 kg/m  09/14/20 27.48 kg/m  07/14/20 26.94 kg/m    Assessment/Interventions: Review of patient past medical history, allergies, medications, health status, including review of consultants reports, laboratory and other test data, was performed as part of comprehensive evaluation and provision of chronic care management services.   SDOH:  (Social Determinants of Health) assessments and interventions performed: Yes  Financial Resource Strain: Low Risk    Difficulty of Paying Living Expenses: Not hard at all   Food Insecurity: No Food Insecurity   Worried About  Charity fundraiser in the Last Year: Never true   Arboriculturist in the Last Year: Never true    SDOH Screenings   Alcohol Screen: Not on file  Depression (PHQ2-9): Low Risk    PHQ-2 Score: 0  Financial Resource Strain: Low Risk    Difficulty of Paying Living Expenses: Not hard at all  Food Insecurity: No Food Insecurity   Worried About Charity fundraiser in the Last Year: Never true   Ran Out of Food in the Last Year: Never true  Housing: Low Risk    Last Housing Risk Score: 0  Physical Activity: Inactive   Days of Exercise per Week: 0 days   Minutes of Exercise  per Session: 0 min  Social Connections: Moderately Integrated   Frequency of Communication with Friends and Family: More than three times a week   Frequency of Social Gatherings with Friends and Family: More than three times a week   Attends Religious Services: 1 to 4 times per year   Active Member of [employer] or Organizations: No   Attends Music therapist: Never   Marital Status: Married  Stress: No Stress Concern Present   Feeling of Stress : Not at all  Tobacco Use: Low Risk    Smoking Tobacco Use: Never   Smokeless Tobacco Use: Never   Passive Exposure: Not on file  Transportation Needs: No Transportation Needs   Lack of Transportation (Medical): No   Lack of Transportation (Non-Medical): No    CCM Care Plan  Allergies  Allergen Reactions   Coumadin [Warfarin] Other (See Comments)    GI bleeds    Medications Reviewed Today     Reviewed by Edythe Clarity, [facility] (Pharmacist) on 03/31/21 at 1151  Med List Status: <None>   Medication Order Taking? Sig Documenting Provider Last Dose Status Informant  allopurinol (ZYLOPRIM) 100 MG tablet [id number] Yes TAKE 1 TABLET EVERY DAY Vivi Barrack, MD Taking Active   apixaban (ELIQUIS) 5 MG TABS tablet [id number] Yes TAKE 1 TABLET BY MOUTH 2 TIMES DAILY. Minus Breeding, MD Taking Active   atorvastatin (LIPITOR) 10 MG tablet [id number] Yes TAKE 1  TABLET EVERY DAY Vivi Barrack, MD Taking Active   azelastine (ASTELIN) 0.1 % nasal spray [id number] Yes Place 2 sprays into both nostrils 2 (two) times daily. Vivi Barrack, MD Taking Active   ciclopirox [facility]) 8 % solution [id number] Yes Apply topically at bedtime. Apply over nail and surrounding skin. Apply daily over previous coat. After seven (7) days, may remove with alcohol and continue cycle. Kennyth Arnold, FNP Taking Active   cyclobenzaprine (FLEXERIL) 10 MG tablet [id number] Yes Take 1 tablet (10 mg total) by mouth 3 (three) times daily as needed for muscle spasms. Vivi Barrack, MD Taking Active   diltiazem [facility]) 240 MG 24 hr capsule [id number] Yes Take 1 capsule (240 mg total) by mouth daily. Minus Breeding, MD Taking Active   furosemide (LASIX) 20 MG tablet [id number] Yes Take 1 tablet (20 mg total) by mouth daily. Minus Breeding, MD Taking Active   HYDROcodone-acetaminophen [facility]) 10-325 MG tablet [id number] Yes Take 1 tablet by mouth every 6 (six) hours as needed. Vivi Barrack, MD Taking Active   metoprolol succinate (TOPROL-XL) 100 MG 24 hr tablet [id number] Yes TAKE 1 TABLET  TWO TIMES DAILY  WITH OR IMMEDIATELY FOLLOWING A MEAL. Minus Breeding, MD Taking Active     Discontinued 04/15/20 1052   sildenafil (REVATIO) 20 MG tablet [id number] Yes TAKE 1 TABLET BY MOUTH AS NEEDED FOR ERECTILE DYSFUNCTION. DO NO TAKE MORE THAN 1 DOSE IN 36 HOURS. Vivi Barrack, MD Taking Active             Patient Active Problem List   Diagnosis Date Noted   Osteoarthritis 04/15/2020   Gastric nodule    OSA (obstructive sleep apnea) 03/06/2020   Chronic atrial fibrillation ([place]) 02/12/2019   Kidney stones, calcium oxalate 12/03/2018   Hyperlipidemia 02/06/2018   Encounter for chronic pain management 03/01/2017   Hyperglycemia 04/23/2016   Chronic anticoagulation 03/02/2015   Gout 06/30/2014   CAD (coronary artery disease) 03/21/2012   GERD (gastroesophageal reflux disease)  03/21/2012   Essential hypertension 04/25/2007  Immunization History  Administered Date(s) Administered   Fluad Quad(high Dose 65+) 11/17/2018, 10/29/2019   Influenza Split 11/30/2011   Influenza Whole 11/10/2009   Influenza, High Dose Seasonal PF 12/02/2012, 11/07/2013, 11/26/2017   Influenza,inj,Quad PF,6+ Mos 10/25/2014, 11/08/2015, 10/31/2016   PFIZER(Purple Top)SARS-COV-2 Vaccination 03/07/2019, 04/01/2019   Pneumococcal Conjugate-13 05/03/2014   Pneumococcal Polysaccharide-23 03/02/2016   Td 01/30/2004   Tdap 05/03/2014    Conditions to be addressed/monitored:  HTN, CAD, Afib, GERD, HLD, Pre-DM  Care Plan : General Pharmacy (Adult)  Updates made by Edythe Clarity, RPH since 03/31/2021 12:00 AM     Problem: HTN, CAD, Afib, GERD, HLD, Pre-DM   Priority: High  Onset Date: 03/31/2021     Long-Range Goal: Patient-Specific Goal   Start Date: 03/31/2021  Expected End Date: 10/01/2021  This Visit's Progress: On track  Priority: High  Note:   Current Barriers:  Increased gout attacks  Pharmacist Clinical Goal(s):  Patient will achieve improvement in gout as evidenced by symptoms through collaboration with PharmD and provider.   Interventions: 1:1 collaboration with Vivi Barrack, MD regarding development and update of comprehensive plan of care as evidenced by provider attestation and co-signature Inter-disciplinary care team collaboration (see longitudinal plan of care) Comprehensive medication review performed; medication list updated in electronic medical record  Hypertension (BP goal <130/80) -Controlled -Current treatment: Metoprolol XL 180m daily Appropriate, Effective, Safe, Accessible Diltiazem CD 2450mdaily Appropriate, Query effective, -Medications previously tried: amlodipine   -Current home readings: 165/105 in the morning one day, took meds got down to 125/80 -Current dietary habits: patient eating about what he wants, 2 16 oz regular soft drinks per  day.  Eating red meats -Current exercise habits: minimal, tries to walk some but has knee pain -Reports hypotensive/hypertensive symptoms - occasional headaches, will wake up and know BP is high take his meds and then it comes back down -Educated on BP goals and benefits of medications for prevention of heart attack, stroke and kidney damage; Daily salt intake goal < 2300 mg; Importance of home blood pressure monitoring; Symptoms of hypotension and importance of maintaining adequate hydration; Limiting soft drinks due to caffeine -Counseled to monitor BP at home as current, document, and provide log at future appointments -Recommended to continue current medication Will FU on BP - may need additional treatment depending on how frequent elevations/HA occur  Hyperlipidemia: (LDL goal < 70) -Controlled -Current treatment: Atorvastatin 1047maily Appropriate, Effective, Safe, Accessible -Medications previously tried: none noted  -Current dietary patterns: see above -Current exercise habits: see above -Educated on Cholesterol goals;  Benefits of statin for ASCVD risk reduction; Importance of limiting foods high in cholesterol; -Recommended to continue current medication Most recent LDL well controlled, has been over 1 year since last check, has upcoming OV in June would recommend recheck at this time.  Pre-Diabetes (A1c goal <6.5%) -Controlled -Current medications: None at this time -Medications previously tried: none  -Current home glucose readings fasting glucose: not checking post prandial glucose: not checking -Denies hypoglycemic/hyperglycemic symptoms -Current meal patterns: does admit to regular soft drink intake lately -Current exercise: some -Educated on A1c and blood sugar goals; Effect of sodas on blood glucose and A1c last time in pre diabetes range -Counseled to check feet daily and get yearly eye exams -Recommended recheck A1c at next OV, recommended he cut back on  regular soft drink intake  Atrial Fibrillation (Goal: prevent stroke and major bleeding) -Controlled -CHADSVASC: 2 -Current treatment: Rate control: Metoprolol XL 100m58mily Appropriate, Effective,  Safe, Accessible Anticoagulation: Eliquis 63m twice daily Appropriate, Effective, Safe, Accessible -Medications previously tried: none noted -Home BP and HR readings: controlled  -Counseled on increased risk of stroke due to Afib and benefits of anticoagulation for stroke prevention; importance of adherence to anticoagulant exactly as prescribed; -Recommended to continue current medication Assessed patient finances. Does admit to high copay, application for patient assistance initiated.  Counseled on 3% OOP minimum spend  GERD (Goal: Minimze symptoms) -Not ideally controlled -Current treatment  None - not currently taking his PPI -Medications previously tried: pantoprazole -Does mention intermittent symptoms of acid reflux, plan to discuss with Dr. PJerline Painat upcoming visit in June  -Recommended continue current management at this time  Patient Goals/Self-Care Activities Patient will:  - take medications as prescribed as evidenced by patient report and record review check blood pressure daily, document, and provide at future appointments  Follow Up Plan: The care management team will reach out to the patient again over the next 180 days.        Medication Assistance: None required.  Patient affirms current coverage meets needs.  Compliance/Adherence/Medication fill history: Care Gaps: None noted  Star-Rating Drugs: Atorvastatin 10 mg last filled 02/25/2021 90 DS  [facility] [id number]: [phone]: [phone] [address]Phone: [phone]: [phone] Uses pill  box? Yes Pt endorses 100% compliance  We discussed: Benefits of medication synchronization, packaging and delivery as well as enhanced pharmacist oversight with [employer]. Patient decided to: Continue current medication management strategy  Care Plan and Follow Up Patient Decision:  Patient agrees to Care Plan and Follow-up.  Plan: The care management team will reach out to the patient again over the next 180 days.  CBeverly Milch PharmD Clinical Pharmacist  [phone]

## 2021-03-31 ENCOUNTER — Ambulatory Visit (INDEPENDENT_AMBULATORY_CARE_PROVIDER_SITE_OTHER): Payer: Medicare HMO | Admitting: Pharmacist

## 2021-03-31 ENCOUNTER — Telehealth: Payer: Self-pay | Admitting: Pharmacist

## 2021-03-31 DIAGNOSIS — I482 Chronic atrial fibrillation, unspecified: Secondary | ICD-10-CM

## 2021-03-31 DIAGNOSIS — E785 Hyperlipidemia, unspecified: Secondary | ICD-10-CM

## 2021-03-31 DIAGNOSIS — I10 Essential (primary) hypertension: Secondary | ICD-10-CM

## 2021-03-31 DIAGNOSIS — K219 Gastro-esophageal reflux disease without esophagitis: Secondary | ICD-10-CM

## 2021-03-31 NOTE — Patient Instructions (Addendum)
Visit Information ? ? Goals Addressed   ? ?  ?  ?  ?  ? This Visit's Progress  ?  Track and Manage My Blood Pressure-Hypertension     ?  Timeframe:  Long-Range Goal ?Priority:  High ?Start Date: 03/31/21                            ?Expected End Date: 10/01/21                      ? ?Follow Up Date 07/01/21  ?  ?- check blood pressure daily ?- choose a place to take my blood pressure (home, clinic or office, retail store) ?- write blood pressure results in a log or diary  ?  ?Why is this important?   ?You won't feel high blood pressure, but it can still hurt your blood vessels.  ?High blood pressure can cause heart or kidney problems. It can also cause a stroke.  ?Making lifestyle changes like losing a little weight or eating less salt will help.  ?Checking your blood pressure at home and at different times of the day can help to control blood pressure.  ?If the doctor prescribes medicine remember to take it the way the doctor ordered.  ?Call the office if you cannot afford the medicine or if there are questions about it.   ?  ?Notes:  ?  ? ?  ? ?Patient Care Plan: General Pharmacy (Adult)  ?  ? ?Problem Identified: HTN, CAD, Afib, GERD, HLD, Pre-DM   ?Priority: High  ?Onset Date: 03/31/2021  ?  ? ?Long-Range Goal: Patient-Specific Goal   ?Start Date: 03/31/2021  ?Expected End Date: 10/01/2021  ?This Visit's Progress: On track  ?Priority: High  ?Note:   ?Current Barriers:  ?Increased gout attacks ? ?Pharmacist Clinical Goal(s):  ?Patient will achieve improvement in gout as evidenced by symptoms through collaboration with PharmD and provider.  ? ?Interventions: ?1:1 collaboration with Vivi Barrack, MD regarding development and update of comprehensive plan of care as evidenced by provider attestation and co-signature ?Inter-disciplinary care team collaboration (see longitudinal plan of care) ?Comprehensive medication review performed; medication list updated in electronic medical record ? ?Hypertension (BP goal  <130/80) ?-Controlled ?-Current treatment: ?Metoprolol XL 100mg  daily Appropriate, Effective, Safe, Accessible ?Diltiazem CD 240mg  daily Appropriate, Query effective, ?-Medications previously tried: amlodipine   ?-Current home readings: 165/105 in the morning one day, took meds got down to 125/80 ?-Current dietary habits: patient eating about what he wants, 2 16 oz regular soft drinks per day.  Eating red meats ?-Current exercise habits: minimal, tries to walk some but has knee pain ?-Reports hypotensive/hypertensive symptoms - occasional headaches, will wake up and know BP is high take his meds and then it comes back down ?-Educated on BP goals and benefits of medications for prevention of heart attack, stroke and kidney damage; ?Daily salt intake goal < 2300 mg; ?Importance of home blood pressure monitoring; ?Symptoms of hypotension and importance of maintaining adequate hydration; ?Limiting soft drinks due to caffeine ?-Counseled to monitor BP at home as current, document, and provide log at future appointments ?-Recommended to continue current medication ?Will FU on BP - may need additional treatment depending on how frequent elevations/HA occur ? ?Hyperlipidemia: (LDL goal < 70) ?-Controlled ?-Current treatment: ?Atorvastatin 10mg  daily Appropriate, Effective, Safe, Accessible ?-Medications previously tried: none noted  ?-Current dietary patterns: see above ?-Current exercise habits: see above ?-Educated on Cholesterol  goals;  ?Benefits of statin for ASCVD risk reduction; ?Importance of limiting foods high in cholesterol; ?-Recommended to continue current medication ?Most recent LDL well controlled, has been over 1 year since last check, has upcoming OV in June would recommend recheck at this time. ? ?Pre-Diabetes (A1c goal <6.5%) ?-Controlled ?-Current medications: ?None at this time ?-Medications previously tried: none  ?-Current home glucose readings ?fasting glucose: not checking ?post prandial glucose: not  checking ?-Denies hypoglycemic/hyperglycemic symptoms ?-Current meal patterns: does admit to regular soft drink intake lately ?-Current exercise: some ?-Educated on A1c and blood sugar goals; ?Effect of sodas on blood glucose and A1c last time in pre diabetes range ?-Counseled to check feet daily and get yearly eye exams ?-Recommended recheck A1c at next OV, recommended he cut back on regular soft drink intake ? ?Atrial Fibrillation (Goal: prevent stroke and major bleeding) ?-Controlled ?-CHADSVASC: 2 ?-Current treatment: ?Rate control: Metoprolol XL 100mg  daily Appropriate, Effective, Safe, Accessible ?Anticoagulation: Eliquis 5mg  twice daily Appropriate, Effective, Safe, Accessible ?-Medications previously tried: none noted ?-Home BP and HR readings: controlled  ?-Counseled on increased risk of stroke due to Afib and benefits of anticoagulation for stroke prevention; ?importance of adherence to anticoagulant exactly as prescribed; ?-Recommended to continue current medication ?Assessed patient finances. Does admit to high copay, application for patient assistance initiated.  Counseled on 3% OOP minimum spend ? ?GERD (Goal: [place] symptoms) ?-Not ideally controlled ?-Current treatment  ?None - not currently taking his PPI ?-Medications previously tried: pantoprazole ?-Does mention intermittent symptoms of acid reflux, plan to discuss with Dr. [name] at upcoming visit in June  ?-Recommended continue current management at this time ? ?Patient Goals/Self-Care Activities ?Patient will:  ?- take medications as prescribed as evidenced by patient report and record review ?check blood pressure daily, document, and provide at future appointments ? ?Follow Up Plan: The care management team will reach out to the patient again over the next 180 days.  ? ?  ? ? ?[name] Woods was given information about Chronic Care Management services today including:  ?CCM service includes personalized support from designated clinical staff  supervised by his physician, including individualized plan of care and coordination with other care providers ?24/7 contact phone numbers for assistance for urgent and routine care needs. ?Standard insurance, coinsurance, copays and deductibles apply for chronic care management only during months in which we provide at least 20 minutes of these services. Most insurances cover these services at 100%, however patients may be responsible for any copay, coinsurance and/or deductible if applicable. This service may help you avoid the need for more expensive face-to-face services. ?Only one practitioner may furnish and bill the service in a calendar month. ?The patient may stop CCM services at any time (effective at the end of the month) by phone call to the office staff. ? ?Patient agreed to services and verbal consent obtained.  ? ?The patient verbalized understanding of instructions, educational materials, and care plan provided today and agreed to receive a mailed copy of patient instructions, educational materials, and care plan.  ?Telephone follow up appointment with pharmacy team member scheduled for: 6 months ? ?Edythe Clarity, [facility]  ?Beverly Milch, PharmD ?Clinical Pharmacist  ?[name] Woods ?[phone] ? ?

## 2021-03-31 NOTE — Progress Notes (Signed)
? ? ?  Chronic Care Management ?Pharmacy Assistant  ? ?Name: [name] Woods  MRN: [id number] DOB: 12-30-1940 ? ? ?Reason for Encounter: PAP for Eliquis ?  ? ?PAP form initiated for Eliquis. Will be mailed to patient to complete patient portion and return to prescribing MD office for final portion to be completed by MD and faxed in for patient for processing. Patient will update on the outcome of their application once received.  ? ? ?Liza Showfety, CCMA ?Clinical Pharmacist Assistant  ?[phone] ? ?

## 2021-04-06 ENCOUNTER — Telehealth: Payer: Self-pay | Admitting: Orthopaedic Surgery

## 2021-04-06 NOTE — Telephone Encounter (Signed)
Pt called and would like to get bil knee gel injections ? ?CB [phone] ?

## 2021-04-06 NOTE — Telephone Encounter (Signed)
Noted  

## 2021-04-12 ENCOUNTER — Other Ambulatory Visit: Payer: Self-pay | Admitting: Family Medicine

## 2021-04-12 ENCOUNTER — Other Ambulatory Visit: Payer: Self-pay | Admitting: Cardiology

## 2021-04-12 ENCOUNTER — Other Ambulatory Visit (HOSPITAL_COMMUNITY): Payer: Self-pay

## 2021-04-12 ENCOUNTER — Telehealth: Payer: Self-pay | Admitting: Cardiology

## 2021-04-12 DIAGNOSIS — M459 Ankylosing spondylitis of unspecified sites in spine: Secondary | ICD-10-CM

## 2021-04-12 MED ORDER — HYDROCODONE-ACETAMINOPHEN 10-325 MG PO TABS
1.0000 | ORAL_TABLET | Freq: Four times a day (QID) | ORAL | 0 refills | Status: DC | PRN
  Filled 2021-04-12 – 2021-04-13 (×2): qty 120, 30d supply, fill #0

## 2021-04-12 NOTE — Telephone Encounter (Signed)
The original prescription was discontinued on 03/31/2021 by Edythe Clarity, [facility] for the following reason: Duplicate. Renewing this prescription may not be appropriate ?

## 2021-04-12 NOTE — Telephone Encounter (Signed)
?*  STAT* If patient is at the pharmacy, call can be transferred to refill team.   1. Which medications need to be refilled? (please list name of each medication and dose if known)   apixaban (ELIQUIS) 5 MG TABS tablet   2. Which pharmacy/location (including street and city if local pharmacy) is medication to be sent to? [facility]  3. Do they need a 30 day or 90 day supply?  30 day   

## 2021-04-13 ENCOUNTER — Other Ambulatory Visit: Payer: Self-pay

## 2021-04-13 ENCOUNTER — Other Ambulatory Visit (HOSPITAL_COMMUNITY): Payer: Self-pay

## 2021-04-13 MED ORDER — APIXABAN 5 MG PO TABS
5.0000 mg | ORAL_TABLET | Freq: Two times a day (BID) | ORAL | 1 refills | Status: DC
  Filled 2021-04-13: qty 30, 15d supply, fill #0
  Filled 2021-05-15: qty 30, 15d supply, fill #1
  Filled 2021-06-13: qty 30, 15d supply, fill #2
  Filled 2021-07-13: qty 30, 15d supply, fill #3
  Filled 2021-08-11: qty 30, 15d supply, fill #4
  Filled 2021-09-13: qty 30, 15d supply, fill #5
  Filled 2021-10-16: qty 30, 15d supply, fill #6

## 2021-04-13 NOTE — Telephone Encounter (Signed)
Prescription refill request for Eliquis received. ?Indication:Afib ?Last office visit:1/23 ?Scr:1.3 ?Age: 81 ?Weight:91.6 kg ? ?Prescription refilled ? ?

## 2021-04-24 NOTE — Telephone Encounter (Signed)
Patient called and would like to follow up on his gel injection request.  ?

## 2021-04-25 ENCOUNTER — Telehealth: Payer: Self-pay

## 2021-04-25 NOTE — Telephone Encounter (Signed)
Talked with patient and appointment has been scheduled.  

## 2021-04-25 NOTE — Telephone Encounter (Signed)
BV pending for Monovisc, bilateral knee. ? ?

## 2021-04-28 ENCOUNTER — Telehealth: Payer: Self-pay

## 2021-04-28 DIAGNOSIS — I482 Chronic atrial fibrillation, unspecified: Secondary | ICD-10-CM | POA: Diagnosis not present

## 2021-04-28 DIAGNOSIS — E785 Hyperlipidemia, unspecified: Secondary | ICD-10-CM | POA: Diagnosis not present

## 2021-04-28 DIAGNOSIS — I10 Essential (primary) hypertension: Secondary | ICD-10-CM | POA: Diagnosis not present

## 2021-04-28 NOTE — Telephone Encounter (Signed)
Approved for MonoVisc, bilateral knee. ?[place] ?Once OOP has been met, patient is covered at 100%. ?Co-pay of $20.00 ?No PA required ? ? ?

## 2021-05-02 ENCOUNTER — Ambulatory Visit: Payer: Medicare HMO | Admitting: Orthopaedic Surgery

## 2021-05-10 ENCOUNTER — Other Ambulatory Visit: Payer: Self-pay | Admitting: Cardiology

## 2021-05-15 ENCOUNTER — Other Ambulatory Visit (HOSPITAL_COMMUNITY): Payer: Self-pay

## 2021-05-15 ENCOUNTER — Other Ambulatory Visit: Payer: Self-pay | Admitting: Family Medicine

## 2021-05-15 DIAGNOSIS — M459 Ankylosing spondylitis of unspecified sites in spine: Secondary | ICD-10-CM

## 2021-05-15 MED ORDER — HYDROCODONE-ACETAMINOPHEN 10-325 MG PO TABS
1.0000 | ORAL_TABLET | Freq: Four times a day (QID) | ORAL | 0 refills | Status: DC | PRN
  Filled 2021-05-15: qty 120, 30d supply, fill #0

## 2021-05-15 NOTE — Telephone Encounter (Signed)
Last appt: 03/09/21 ? ?Next visit: 07/28/21 ? ?Last filled: 04/12/21 ? ?Quantity: 120 tablets ?

## 2021-05-16 ENCOUNTER — Ambulatory Visit: Payer: Medicare HMO | Admitting: Orthopaedic Surgery

## 2021-05-16 ENCOUNTER — Other Ambulatory Visit (HOSPITAL_COMMUNITY): Payer: Self-pay

## 2021-05-16 DIAGNOSIS — M170 Bilateral primary osteoarthritis of knee: Secondary | ICD-10-CM | POA: Diagnosis not present

## 2021-05-16 MED ORDER — HYALURONAN 88 MG/4ML IX SOSY
88.0000 mg | PREFILLED_SYRINGE | INTRA_ARTICULAR | Status: AC | PRN
  Administered 2021-05-16: 88 mg via INTRA_ARTICULAR

## 2021-05-16 MED ORDER — LIDOCAINE HCL 1 % IJ SOLN
3.0000 mL | INTRAMUSCULAR | Status: AC | PRN
  Administered 2021-05-16: 3 mL

## 2021-05-16 MED ORDER — BUPIVACAINE HCL 0.25 % IJ SOLN
0.6600 mL | INTRAMUSCULAR | Status: AC | PRN
  Administered 2021-05-16: .66 mL via INTRA_ARTICULAR

## 2021-05-16 NOTE — Progress Notes (Signed)
? ?Office Visit Note ?  ?Patient: TEMPLE SPORER           ?Date of Birth: May 07, 1940           ?MRN: [id number] ?Visit Date: 05/16/2021 ?             ?Requested by: Vivi Barrack, MD ?[address] ?[place],  [place] [address] ?PCP: Vivi Barrack, MD ? ? ?Assessment & Plan: ?Visit Diagnoses:  ?1. Bilateral primary osteoarthritis of knee   ? ? ?Plan: Impression is bilateral knee degenerative joint disease.  Today we proceeded with bilateral knee Monovisc injections.  He tolerated these well.  We will follow-up with [place] as needed. ? ?Follow-Up Instructions: Return if symptoms worsen or fail to improve.  ? ?Orders:  ?Orders Placed This Encounter  ?Procedures  ? Large Joint Inj  ? ?No orders of the defined types were placed in this encounter. ? ? ? ? Procedures: ?Large Joint Inj: bilateral knee on 05/16/2021 2:22 PM ?Indications: pain ?Details: 22 G needle, anterolateral approach ?Medications (Right): 0.66 mL bupivacaine 0.25 %; 3 mL lidocaine 1 %; 88 mg Hyaluronan 88 MG/4ML ?Medications (Left): 0.66 mL bupivacaine 0.25 %; 3 mL lidocaine 1 %; 88 mg Hyaluronan 88 MG/4ML ? ? ? ? ?Clinical Data: ?No additional findings. ? ? ?Subjective: ?Chief Complaint  ?Patient presents with  ? Left Knee - Follow-up  ? Right Knee - Follow-up  ? ? ?HPI patient is a pleasant 81 year old gentleman who comes in today with bilateral knee osteoarthritis.  Has previously undergone cortisone injections with good but temporary relief.  No previous viscosupplementation injection.  He is here today for bilateral Monovisc injections. ? ? ? ? ?Objective: ?Vital Signs: There were no vitals taken for this visit. ? ? ? ?Ortho Exam unchanged bilateral knee exam ? ?Specialty Comments:  ?No specialty comments available. ? ?Imaging: ?No new imaging ? ? ?PMFS History: ?Patient Active Problem List  ? Diagnosis Date Noted  ? Osteoarthritis 04/15/2020  ? Gastric nodule   ? OSA (obstructive sleep apnea) 03/06/2020  ? Chronic atrial fibrillation ([place]) 02/12/2019   ? Kidney stones, calcium oxalate 12/03/2018  ? Hyperlipidemia 02/06/2018  ? Encounter for chronic pain management 03/01/2017  ? Hyperglycemia 04/23/2016  ? Chronic anticoagulation 03/02/2015  ? Gout 06/30/2014  ? CAD (coronary artery disease) 03/21/2012  ? GERD (gastroesophageal reflux disease) 03/21/2012  ? Essential hypertension 04/25/2007  ? ?Past Medical History:  ?Diagnosis Date  ? Ankylosing spondylitis ([place])   ? Anxiety and depression 09/12/2015  ? Aortic atherosclerosis ([place])   ? Arthritis   ? Bilateral inguinal hernia   ? CAD (coronary artery disease) 2003,2014  ? 30% mid LAD lesion on cardiac cath  ? Chicken pox   ? Cholelithiasis   ? CHOLELITHIASIS, HX OF 04/25/2007  ? Qualifier: Diagnosis of  By: Marland Mcalpine    ? Diverticulosis 11/26/2018  ? Moderate, Left Noted on Colonoscopy  ? Dysrhythmia   ? atrial fibrillation  ? Gallstones   ? GERD (gastroesophageal reflux disease)   ? [place] measles   ? Hiatal hernia   ? History of colon polyps 11/26/2018  ? History of kidney stones   ? HLD (hyperlipidemia)   ? HTN (hypertension)   ? Hypertension   ? Nephrolithiasis   ? NEPHROLITHIASIS, HX OF 04/25/2007  ? Qualifier: Diagnosis of  By: Marland Mcalpine    ? Partial small bowel obstruction ([place]) 03/06/2020  ? Persistent atrial fibrillation ([place])   ? Sleep apnea   ?  No CPAP  ? Tubular adenoma of colon 03/1993  ? Ventricular hypertrophy   ?  ?Family History  ?Problem Relation Age of Onset  ? Heart disease Father 63  ?     Deceased  ? Heart attack Father 25  ? Heart disease Brother 42  ?     "Died in his sleep"  ? Arthritis/Rheumatoid Mother   ?     Deceased-86  ? Hyperlipidemia Brother   ? Stroke Brother 38  ?     Deceased  ? Other Sister   ?     Fire in Home  ? Other Sister   ?     MVA  ? Other Brother   ?     Carjacked-killed  ? Dementia Paternal Grandmother   ? Cancer Paternal Uncle   ? Healthy Brother   ? Healthy Son   ?     X3  ? Healthy Daughter   ?     x1  ? Colon cancer Neg Hx   ? Esophageal cancer Neg  Hx   ? Stomach cancer Neg Hx   ? Rectal cancer Neg Hx   ?  ?Past Surgical History:  ?Procedure Laterality Date  ? BIOPSY  03/08/2020  ? Procedure: BIOPSY;  Surgeon: Yetta Flock, MD;  [facility] ENDOSCOPY;  Service: Gastroenterology;;  ? CARDIAC CATHETERIZATION  03/2012  ? 30% mid LAD lesion otherwise normal cors, LVEF 65%  ? CHOLECYSTECTOMY    ? COLONOSCOPY  11/26/2018  ? CYSTOSCOPY WITH RETROGRADE PYELOGRAM, URETEROSCOPY AND STENT PLACEMENT Right 01/02/2019  ? Procedure: CYSTOSCOPY WITH RETROGRADE PYELOGRAM, URETEROSCOPY AND STENT PLACEMENT;  Surgeon: Alexis Frock, MD;  Location: [facility] ORS;  Service: Urology;  Laterality: Right;  1 HR  ? ELBOW ARTHROPLASTY Left   ? ESOPHAGOGASTRODUODENOSCOPY (EGD) WITH PROPOFOL N/A 03/08/2020  ? Procedure: ESOPHAGOGASTRODUODENOSCOPY (EGD) WITH PROPOFOL;  Surgeon: Yetta Flock, MD;  Location: [place];  Service: Gastroenterology;  Laterality: N/A;  ? HAND SURGERY Left   ? HOLMIUM LASER APPLICATION Right 19/04/1738  ? Procedure: HOLMIUM LASER APPLICATION;  Surgeon: Alexis Frock, MD;  Location: [facility] ORS;  Service: Urology;  Laterality: Right;  ? KNEE SURGERY    ? x2 right  ? LEFT AND RIGHT HEART CATHETERIZATION WITH CORONARY ANGIOGRAM N/A 03/24/2012  ? Procedure: LEFT AND RIGHT HEART CATHETERIZATION WITH CORONARY ANGIOGRAM;  Surgeon: Sherren Mocha, MD;  Location: [facility] CATH LAB;  Service: Cardiovascular;  Laterality: N/A;  ? LEG FLUID REMOVAL RIGHT    ? TONSILLECTOMY    ? UPPER GI ENDOSCOPY    ? ?Social History  ? ?Occupational History  ? Occupation: Retired  ?  Employer: RETIRED  ?  Comment: Heating and air  ?Tobacco Use  ? Smoking status: Never  ? Smokeless tobacco: Never  ?Vaping Use  ? Vaping Use: Never used  ?Substance and Sexual Activity  ? Alcohol use: No  ? Drug use: No  ? Sexual activity: Yes  ? ? ? ? ? ? ?

## 2021-05-18 ENCOUNTER — Telehealth: Payer: Medicare HMO | Admitting: Physician Assistant

## 2021-05-18 DIAGNOSIS — B9689 Other specified bacterial agents as the cause of diseases classified elsewhere: Secondary | ICD-10-CM

## 2021-05-18 DIAGNOSIS — J019 Acute sinusitis, unspecified: Secondary | ICD-10-CM

## 2021-05-19 ENCOUNTER — Other Ambulatory Visit (HOSPITAL_COMMUNITY): Payer: Self-pay

## 2021-05-19 MED ORDER — AMOXICILLIN-POT CLAVULANATE 875-125 MG PO TABS
1.0000 | ORAL_TABLET | Freq: Two times a day (BID) | ORAL | 0 refills | Status: DC
  Filled 2021-05-19: qty 14, 7d supply, fill #0

## 2021-05-19 NOTE — Progress Notes (Signed)
I have spent 5 minutes in review of e-visit questionnaire, review and updating patient chart, medical decision making and response to patient.   Crystalmarie Yasin Cody De Libman, PA-C    

## 2021-05-19 NOTE — Progress Notes (Signed)

## 2021-06-01 ENCOUNTER — Encounter: Payer: Self-pay | Admitting: *Deleted

## 2021-06-06 ENCOUNTER — Encounter: Payer: Self-pay | Admitting: *Deleted

## 2021-06-13 ENCOUNTER — Other Ambulatory Visit (HOSPITAL_COMMUNITY): Payer: Self-pay

## 2021-06-13 ENCOUNTER — Other Ambulatory Visit: Payer: Self-pay | Admitting: Family Medicine

## 2021-06-13 DIAGNOSIS — M459 Ankylosing spondylitis of unspecified sites in spine: Secondary | ICD-10-CM

## 2021-06-13 MED ORDER — HYDROCODONE-ACETAMINOPHEN 10-325 MG PO TABS
1.0000 | ORAL_TABLET | Freq: Four times a day (QID) | ORAL | 0 refills | Status: DC | PRN
  Filled 2021-06-13 (×2): qty 120, 30d supply, fill #0

## 2021-06-14 ENCOUNTER — Other Ambulatory Visit (HOSPITAL_COMMUNITY): Payer: Self-pay

## 2021-06-21 NOTE — Telephone Encounter (Signed)
Left message for patient for the amount needed to qualify for assistance for eliquis. He is to call with questions.

## 2021-06-24 ENCOUNTER — Other Ambulatory Visit: Payer: Self-pay | Admitting: Cardiology

## 2021-07-13 ENCOUNTER — Other Ambulatory Visit: Payer: Self-pay | Admitting: Family Medicine

## 2021-07-13 ENCOUNTER — Other Ambulatory Visit (HOSPITAL_COMMUNITY): Payer: Self-pay

## 2021-07-13 DIAGNOSIS — M459 Ankylosing spondylitis of unspecified sites in spine: Secondary | ICD-10-CM

## 2021-07-13 MED ORDER — HYDROCODONE-ACETAMINOPHEN 10-325 MG PO TABS
1.0000 | ORAL_TABLET | Freq: Four times a day (QID) | ORAL | 0 refills | Status: DC | PRN
  Filled 2021-07-13: qty 120, 30d supply, fill #0

## 2021-07-13 NOTE — Telephone Encounter (Signed)
LAST APPOINTMENT DATE: 03/09/2021   NEXT APPOINTMENT DATE: 07/28/2021    LAST REFILL:06/13/2021  QTY:120

## 2021-07-22 ENCOUNTER — Other Ambulatory Visit: Payer: Self-pay | Admitting: Family Medicine

## 2021-07-24 ENCOUNTER — Telehealth: Payer: Self-pay | Admitting: Family Medicine

## 2021-07-28 ENCOUNTER — Ambulatory Visit: Payer: Medicare HMO

## 2021-08-02 ENCOUNTER — Telehealth: Payer: Self-pay | Admitting: Family Medicine

## 2021-08-02 NOTE — Telephone Encounter (Signed)
..  Type of form received: DMV placard  Additional comments:   Received by: patient drop off   Form should be Faxed to:  Form should be mailed to:    Is patient requesting call for pickup: yes    Form placed:  dr Ellwood Handler folder front desk   Attach charge sheet.  Yes   Individual made aware of 3-5 business day turn around (Y/N)?

## 2021-08-03 NOTE — Telephone Encounter (Signed)
Form done, placed at front office to be pickup Patient notified  Copy placed to be scan in patient chart

## 2021-08-11 ENCOUNTER — Other Ambulatory Visit (HOSPITAL_COMMUNITY): Payer: Self-pay

## 2021-08-11 ENCOUNTER — Other Ambulatory Visit: Payer: Self-pay | Admitting: Family Medicine

## 2021-08-11 DIAGNOSIS — M459 Ankylosing spondylitis of unspecified sites in spine: Secondary | ICD-10-CM

## 2021-08-14 ENCOUNTER — Other Ambulatory Visit (HOSPITAL_COMMUNITY): Payer: Self-pay

## 2021-08-14 MED ORDER — HYDROCODONE-ACETAMINOPHEN 10-325 MG PO TABS
1.0000 | ORAL_TABLET | Freq: Four times a day (QID) | ORAL | 0 refills | Status: DC | PRN
  Filled 2021-08-14: qty 120, 30d supply, fill #0

## 2021-08-16 ENCOUNTER — Other Ambulatory Visit (HOSPITAL_COMMUNITY): Payer: Self-pay

## 2021-08-17 ENCOUNTER — Ambulatory Visit (INDEPENDENT_AMBULATORY_CARE_PROVIDER_SITE_OTHER): Payer: Medicare HMO | Admitting: Family Medicine

## 2021-08-17 ENCOUNTER — Encounter: Payer: Self-pay | Admitting: Family Medicine

## 2021-08-17 ENCOUNTER — Other Ambulatory Visit (HOSPITAL_COMMUNITY): Payer: Self-pay

## 2021-08-17 VITALS — BP 136/79 | HR 59 | Temp 98.0°F | Ht 72.0 in | Wt 200.8 lb

## 2021-08-17 DIAGNOSIS — E785 Hyperlipidemia, unspecified: Secondary | ICD-10-CM | POA: Diagnosis not present

## 2021-08-17 DIAGNOSIS — M1990 Unspecified osteoarthritis, unspecified site: Secondary | ICD-10-CM

## 2021-08-17 DIAGNOSIS — N400 Enlarged prostate without lower urinary tract symptoms: Secondary | ICD-10-CM | POA: Diagnosis not present

## 2021-08-17 DIAGNOSIS — N529 Male erectile dysfunction, unspecified: Secondary | ICD-10-CM | POA: Diagnosis not present

## 2021-08-17 DIAGNOSIS — L309 Dermatitis, unspecified: Secondary | ICD-10-CM | POA: Diagnosis not present

## 2021-08-17 DIAGNOSIS — Z79899 Other long term (current) drug therapy: Secondary | ICD-10-CM | POA: Diagnosis not present

## 2021-08-17 DIAGNOSIS — I10 Essential (primary) hypertension: Secondary | ICD-10-CM

## 2021-08-17 DIAGNOSIS — R202 Paresthesia of skin: Secondary | ICD-10-CM

## 2021-08-17 DIAGNOSIS — R739 Hyperglycemia, unspecified: Secondary | ICD-10-CM | POA: Diagnosis not present

## 2021-08-17 LAB — CBC
HCT: 48.1 % (ref 39.0–52.0)
Hemoglobin: 15.8 g/dL (ref 13.0–17.0)
MCHC: 32.9 g/dL (ref 30.0–36.0)
MCV: 90.1 fl (ref 78.0–100.0)
Platelets: 180 10*3/uL (ref 150.0–400.0)
RBC: 5.34 Mil/uL (ref 4.22–5.81)
RDW: 15 % (ref 11.5–15.5)
WBC: 7.7 10*3/uL (ref 4.0–10.5)

## 2021-08-17 LAB — COMPREHENSIVE METABOLIC PANEL
ALT: 13 U/L (ref 0–53)
AST: 21 U/L (ref 0–37)
Albumin: 4.1 g/dL (ref 3.5–5.2)
Alkaline Phosphatase: 72 U/L (ref 39–117)
BUN: 21 mg/dL (ref 6–23)
CO2: 30 mEq/L (ref 19–32)
Calcium: 9 mg/dL (ref 8.4–10.5)
Chloride: 104 mEq/L (ref 96–112)
Creatinine, Ser: 1.3 mg/dL (ref 0.40–1.50)
GFR: 51.88 mL/min — ABNORMAL LOW (ref >60.00)
Glucose, Bld: 85 mg/dL (ref 70–99)
Potassium: 3.4 mEq/L — ABNORMAL LOW (ref 3.5–5.1)
Sodium: 144 mEq/L (ref 135–145)
Total Bilirubin: 2.6 mg/dL — ABNORMAL HIGH (ref 0.2–1.2)
Total Protein: 6.7 g/dL (ref 6.0–8.3)

## 2021-08-17 LAB — LIPID PANEL
Cholesterol: 102 mg/dL (ref 0–200)
HDL: 35.8 mg/dL — ABNORMAL LOW (ref >39.00)
LDL Cholesterol: 49 mg/dL (ref 0–99)
NonHDL: 66.4
Total CHOL/HDL Ratio: 3
Triglycerides: 88 mg/dL (ref 0.0–149.0)
VLDL: 17.6 mg/dL (ref 0.0–40.0)

## 2021-08-17 LAB — TSH: TSH: 1.8 u[IU]/mL (ref 0.35–5.50)

## 2021-08-17 LAB — VITAMIN B12: Vitamin B-12: 216 pg/mL (ref 211–911)

## 2021-08-17 LAB — HEMOGLOBIN A1C: Hgb A1c MFr Bld: 6 % (ref 4.6–6.5)

## 2021-08-17 MED ORDER — TADALAFIL 5 MG PO TABS
2.5000 mg | ORAL_TABLET | Freq: Every day | ORAL | 3 refills | Status: DC
  Filled 2021-08-17: qty 15, 30d supply, fill #0
  Filled 2021-10-16 – 2021-11-16 (×2): qty 15, 30d supply, fill #1

## 2021-08-17 MED ORDER — HYDROXYZINE PAMOATE 25 MG PO CAPS
25.0000 mg | ORAL_CAPSULE | Freq: Three times a day (TID) | ORAL | 0 refills | Status: DC | PRN
  Filled 2021-08-17: qty 30, 10d supply, fill #0

## 2021-08-17 NOTE — Assessment & Plan Note (Signed)
Has mild nocturia and frequency but this is mostly tolerable.  We will try low-dose Cialis 2.5 mg daily.

## 2021-08-17 NOTE — Assessment & Plan Note (Signed)
Check A1c. 

## 2021-08-17 NOTE — Assessment & Plan Note (Signed)
Likely secondary to lumbar radiculopathy given positional nature and positive physical exam findings however we will check labs today including B12 and A1c.

## 2021-08-17 NOTE — Assessment & Plan Note (Signed)
He is having some bad side effects with low-dose sildenafil.  We will try low-dose Cialis 2.5 mg daily as above.  We discussed potential side effects.  He can follow-up with me in a few weeks if he has any adverse reactions or if he needs a higher strength medication.

## 2021-08-17 NOTE — Progress Notes (Signed)
   [name] Woods is a 81 y.o. male who presents today for an office visit.  Assessment/Plan:  Chronic Problems Addressed Today: Essential hypertension At goal today on diltiazem to 40 mg daily metoprolol succinate 100 mg twice daily.  Paresthesia Likely secondary to lumbar radiculopathy given positional nature and positive physical exam findings however we will check labs today including B12 and A1c.  BPH (benign prostatic hyperplasia) Has mild nocturia and frequency but this is mostly tolerable.  We will try low-dose Cialis 2.5 mg daily.  Erectile dysfunction He is having some bad side effects with low-dose sildenafil.  We will try low-dose Cialis 2.5 mg daily as above.  We discussed potential side effects.  He can follow-up with me in a few weeks if he has any adverse reactions or if he needs a higher strength medication.  Dermatitis Does not currently have any rash.  Sounds like he may be having intermittent allergic type reaction though does not have any red flags.  We will send in hydroxyzine.  Also recommend over-the-counter antihistamine such as Claritin or Zyrtec.  He will come back in if symptoms return.  Osteoarthritis Database without any red flags.  He is on Norco 10-3 25 every 6 hours as needed.  Does not need refill today.  Medications helps with his Woods management and disability perform activities of daily living.  No significant side effects.  Follow-up in 3 to 6 months.  Hyperlipidemia Check lipids.  He is on atorvastatin 10 mg daily.  Hyperglycemia Check A1c.      Subjective:  HPI:  See A/p for status of chronic conditions.   He has a few additional things he would like to discuss today.  He occasionally gets a pruritic rash few times a year.  This occurs very suddenly.  No obvious precipitating events.  No difficulty breathing.  No facial swelling.  He takes a Benadryl and symptoms usually subside over a day or so.  This happens a couple times per  year.  He also gets occasionally numbness and tingling in his bilateral feet.  This potentially could be positional in nature.  No other obvious injuries.  No precipitating events.  Occasionally gets some low back Woods but this seems to be manageable.  No reported bowel or bladder incontinence.  No other weakness or numbness.       Objective:  Physical Exam: BP 136/79   Pulse (!) 59   Temp 98 F (36.7 C) (Temporal)   Ht 6' (1.829 m)   Wt 200 lb 12.8 oz (91.1 kg)   SpO2 99%   BMI 27.23 kg/m   Gen: No acute distress, resting comfortably CV: Regular rate and rhythm with no murmurs appreciated Pulm: Normal work of breathing, clear to auscultation bilaterally with no crackles, wheezes, or rhonchi MSK:  - Back: No deformities.  Slightly tender to palpation along bilateral lower lumbar paraspinal muscles - Feet: No deformities.  Sensation light touch intact throughout.  Straight leg raise negative.  Positive Tinel sign at bilateral medial malleoli. Neuro: Grossly normal, moves all extremities Psych: Normal affect and thought content      [name] Woods M. [name] Pain, MD 08/17/2021 11:20 AM

## 2021-08-17 NOTE — Assessment & Plan Note (Signed)
Database without any red flags.  He is on Norco 10-3 25 every 6 hours as needed.  Does not need refill today.  Medications helps with his pain management and disability perform activities of daily living.  No significant side effects.  Follow-up in 3 to 6 months.

## 2021-08-17 NOTE — Assessment & Plan Note (Signed)
Check lipids.  He is on atorvastatin 10 mg daily.

## 2021-08-17 NOTE — Assessment & Plan Note (Signed)
Does not currently have any rash.  Sounds like he may be having intermittent allergic type reaction though does not have any red flags.  We will send in hydroxyzine.  Also recommend over-the-counter antihistamine such as Claritin or Zyrtec.  He will come back in if symptoms return.

## 2021-08-17 NOTE — Patient Instructions (Addendum)
It was very nice to see you today!  We will check blood work today.  Please try the hydoxyzine to see if this helps with your itching. You can try using one of the over counter medications such as Claritin or Zyrtec.  Please try the Cialis.  Let me know if you need to increase the dose or if you have any side effects.  We will see back in 6 months.  Come back sooner if needed.  Take care, Dr [name]  PLEASE NOTE:  If you had any lab tests please let us know if you have not heard back within a few days. You may see your results on mychart before we have a chance to review them but we will give you a call once they are reviewed by [place]. If we ordered any referrals today, please let us know if you have not heard from their office within the next week.   Please try these tips to maintain a healthy lifestyle:  Eat at least 3 REAL meals and 1-2 snacks per day.  Aim for no more than 5 hours between eating.  If you eat breakfast, please do so within one hour of getting up.   Each meal should contain half fruits/vegetables, one quarter protein, and one quarter carbs (no bigger than a computer mouse)  Cut down on sweet beverages. This includes juice, soda, and sweet tea.   Drink at least 1 glass of water with each meal and aim for at least 8 glasses per day  Exercise at least 150 minutes every week.

## 2021-08-17 NOTE — Assessment & Plan Note (Signed)
At goal today on diltiazem to 40 mg daily metoprolol succinate 100 mg twice daily.

## 2021-08-21 NOTE — Progress Notes (Signed)
Please inform patient of the following:  His B12 is in the low range of normal.  This could be contributing to some of the symptoms.  Recommend starting the B12 protocol here.  A1c is borderline elevated but stable.  He should continue to work on diet and exercise and we can recheck this in year.  His cholesterol levels look good.  The rest of his blood work was normal and we can recheck this in a year.

## 2021-09-01 ENCOUNTER — Telehealth: Payer: Self-pay | Admitting: Family Medicine

## 2021-09-01 NOTE — Telephone Encounter (Signed)
Pt states: -received message a few weeks ago about b12   Pt asks: -what were the results -why are B12 shots needed  Pt requests: -call back

## 2021-09-05 ENCOUNTER — Encounter: Payer: Self-pay | Admitting: Family Medicine

## 2021-09-05 NOTE — Telephone Encounter (Signed)
Please advise 

## 2021-09-05 NOTE — Telephone Encounter (Signed)
Patient states: - He was informed by PCP's assistant to call and schedule B12 injections. - Was told that B12 injections were set to be completed once a week for 6 weeks and then once a month for 3 months.   Due to this frequency not being noted, I did not schedule this out of an abundance of caution. Please clarify B12 injection frequency.

## 2021-09-05 NOTE — Telephone Encounter (Signed)
He can schedule an office visit to discuss.  Algis Greenhouse. [name], MD 09/05/2021 1:58 PM

## 2021-09-06 NOTE — Telephone Encounter (Signed)
Patient has OV on 09/12/2021 to go over B12 information

## 2021-09-06 NOTE — Telephone Encounter (Signed)
Please schedule OV to discuss

## 2021-09-12 ENCOUNTER — Ambulatory Visit: Payer: Medicare HMO | Admitting: Family Medicine

## 2021-09-13 ENCOUNTER — Other Ambulatory Visit: Payer: Self-pay | Admitting: Family Medicine

## 2021-09-13 ENCOUNTER — Other Ambulatory Visit (HOSPITAL_COMMUNITY): Payer: Self-pay

## 2021-09-13 DIAGNOSIS — M459 Ankylosing spondylitis of unspecified sites in spine: Secondary | ICD-10-CM

## 2021-09-14 ENCOUNTER — Ambulatory Visit (INDEPENDENT_AMBULATORY_CARE_PROVIDER_SITE_OTHER): Payer: Medicare HMO | Admitting: Family Medicine

## 2021-09-14 ENCOUNTER — Other Ambulatory Visit (HOSPITAL_COMMUNITY): Payer: Self-pay

## 2021-09-14 ENCOUNTER — Encounter: Payer: Self-pay | Admitting: Family Medicine

## 2021-09-14 VITALS — BP 160/70 | HR 51 | Temp 98.1°F | Ht 72.0 in | Wt 202.8 lb

## 2021-09-14 DIAGNOSIS — I10 Essential (primary) hypertension: Secondary | ICD-10-CM

## 2021-09-14 DIAGNOSIS — E538 Deficiency of other specified B group vitamins: Secondary | ICD-10-CM

## 2021-09-14 DIAGNOSIS — R202 Paresthesia of skin: Secondary | ICD-10-CM

## 2021-09-14 DIAGNOSIS — M1990 Unspecified osteoarthritis, unspecified site: Secondary | ICD-10-CM

## 2021-09-14 DIAGNOSIS — M109 Gout, unspecified: Secondary | ICD-10-CM | POA: Diagnosis not present

## 2021-09-14 DIAGNOSIS — M459 Ankylosing spondylitis of unspecified sites in spine: Secondary | ICD-10-CM | POA: Diagnosis not present

## 2021-09-14 MED ORDER — HYDROCODONE-ACETAMINOPHEN 10-325 MG PO TABS
1.0000 | ORAL_TABLET | Freq: Four times a day (QID) | ORAL | 0 refills | Status: DC | PRN
  Filled 2021-09-14: qty 120, 30d supply, fill #0

## 2021-09-14 MED ORDER — VITAMIN B-12 1000 MCG PO TABS
1000.0000 ug | ORAL_TABLET | Freq: Every day | ORAL | 3 refills | Status: AC
  Filled 2021-09-14 – 2022-08-17 (×2): qty 100, 100d supply, fill #0

## 2021-09-14 MED ORDER — PREDNISONE 50 MG PO TABS
50.0000 mg | ORAL_TABLET | Freq: Every day | ORAL | 0 refills | Status: DC
  Filled 2021-09-14: qty 5, 5d supply, fill #0

## 2021-09-14 MED ORDER — CYANOCOBALAMIN 1000 MCG/ML IJ SOLN
1000.0000 ug | Freq: Once | INTRAMUSCULAR | Status: AC
  Administered 2021-09-14: 1000 ug via INTRAMUSCULAR

## 2021-09-14 NOTE — Progress Notes (Signed)
   [name] Woods is a 81 y.o. male who presents today for an office visit.  Assessment/Plan:  New/Acute Problems: Skin Lesion Needs biopsy.  He will follow-up with dermatology soon.  If he cannot get into see them in a reasonable time he will come back here for shave biopsy.  Concern for basal cell versus squamous cell versus keratoacanthoma.  Chronic Problems Addressed Today: Gout Recent flare.  No red flags.  We will start prednisone burst.  This is worked well for him in the past.  Essential hypertension Elevated today though typically well controlled.  Continue current regimen diltiazem 240 mg daily, metoprolol succinate 100 mg twice daily.  He will continue to monitor at home and let us know if persistently elevated.  B12 deficiency Likely contributing to his paresthesias.  We discussed B12 replacement.  We will give B12 shot today he will start oral supplementation next week with 1000 mcg daily.  He can come back in 3 to 6 months to recheck.  Paresthesia Multifactorial.  Does have some lumbar radiculopathy symptoms which is likely contributing though he does have low B12 which is contributing as well.  We will start B12 replacement as above and he will let me know if not improving.  Osteoarthritis Database with no red flags.  On Norco 10-3 25 every 6 hours as needed.  Will refill today.  Medications help with pain and ability to perform activities of daily living.  No significant side effects.  Follow-up in 3 to 6 months.     Subjective:  HPI:  See A/p for status of chronic conditions.    He has also had a lesion on his left forearm for the past several months        Objective:  Physical Exam: BP (!) 160/70   Pulse (!) 51   Temp 98.1 F (36.7 C) (Temporal)   Ht 6' (1.829 m)   Wt 202 lb 12.8 oz (92 kg)   SpO2 98%   BMI 27.50 kg/m   Gen: No acute distress, resting comfortably CV: Regular rate and rhythm with no murmurs appreciated Pulm: Normal work of  breathing, clear to auscultation bilaterally with no crackles, wheezes, or rhonchi Skin: 1.5 cm pedunculated lesion with overlying excoriation and ulceration on the left lateral forearm Neuro: Grossly normal, moves all extremities Psych: Normal affect and thought content      Vendela Troung M. [name], MD 09/14/2021 11:21 AM

## 2021-09-14 NOTE — Patient Instructions (Addendum)
It was very nice to see you today!  We will give you a B12 injection today.  Please start taking 1000 mcg daily.  Come back in 3 months or so to recheck your  I will refill your Norco today.  I will send prednisone in for your gout.  Please call the dermatologist soon to discuss the spot on your forearm.  If it will take several months to get into see them please come back here and we can do an excision  Take care, Dr [name]  PLEASE NOTE:  If you had any lab tests please let us know if you have not heard back within a few days. You may see your results on mychart before we have a chance to review them but we will give you a call once they are reviewed by [place]. If we ordered any referrals today, please let us know if you have not heard from their office within the next week.   Please try these tips to maintain a healthy lifestyle:  Eat at least 3 REAL meals and 1-2 snacks per day.  Aim for no more than 5 hours between eating.  If you eat breakfast, please do so within one hour of getting up.   Each meal should contain half fruits/vegetables, one quarter protein, and one quarter carbs (no bigger than a computer mouse)  Cut down on sweet beverages. This includes juice, soda, and sweet tea.   Drink at least 1 glass of water with each meal and aim for at least 8 glasses per day  Exercise at least 150 minutes every week.

## 2021-09-14 NOTE — Assessment & Plan Note (Signed)
Elevated today though typically well controlled.  Continue current regimen diltiazem 240 mg daily, metoprolol succinate 100 mg twice daily.  He will continue to monitor at home and let us know if persistently elevated.

## 2021-09-14 NOTE — Addendum Note (Signed)
Addended by: Betti Cruz on: 09/14/2021 11:29 AM   Modules accepted: Orders

## 2021-09-14 NOTE — Assessment & Plan Note (Signed)
Multifactorial.  Does have some lumbar radiculopathy symptoms which is likely contributing though he does have low B12 which is contributing as well.  We will start B12 replacement as above and he will let me know if not improving.

## 2021-09-14 NOTE — Assessment & Plan Note (Signed)
Database with no red flags.  On Norco 10-3 25 every 6 hours as needed.  Will refill today.  Medications help with pain and ability to perform activities of daily living.  No significant side effects.  Follow-up in 3 to 6 months.

## 2021-09-14 NOTE — Assessment & Plan Note (Signed)
Recent flare.  No red flags.  We will start prednisone burst.  This is worked well for him in the past.

## 2021-09-14 NOTE — Assessment & Plan Note (Signed)
Likely contributing to his paresthesias.  We discussed B12 replacement.  We will give B12 shot today he will start oral supplementation next week with 1000 mcg daily.  He can come back in 3 to 6 months to recheck.

## 2021-09-18 ENCOUNTER — Other Ambulatory Visit (HOSPITAL_COMMUNITY): Payer: Self-pay

## 2021-09-19 ENCOUNTER — Other Ambulatory Visit (HOSPITAL_COMMUNITY): Payer: Self-pay

## 2021-09-28 ENCOUNTER — Telehealth: Payer: Self-pay | Admitting: Cardiology

## 2021-09-28 NOTE — Telephone Encounter (Signed)
Pt c/o medication issue:  1. Name of Medication: Eliquis  2. How are you currently taking this medication (dosage and times per day)?   3. Are you having a reaction (difficulty breathing--STAT)?   4. What is your medication issue? Pt states that he tried to get assistance for this medication a few months ago, but was told he had to pay a certain amount first. He states that he has paid it and filled out paperwork and would like a call back to discuss where he should come and drop paperwork off at. Please advise.

## 2021-09-28 NOTE — Telephone Encounter (Signed)
Informed patient he may drop off his [employer] paperwork at the front desk at [facility]: and it will be given to Dr. Rosezella [place] nurse.

## 2021-09-29 NOTE — Progress Notes (Deleted)
Chronic Care Management Pharmacy Note  09/29/2021 Name:  [name] Woods MRN:  [id number] DOB:  08-28-1940  Summary: Initial visit with PharmD.  Having a few more gout attacks recently.  Admits to increased soda intake.  Eating red meats.  Recommendations/Changes made from today's visit: If gout flares increase could increase allopurinol Recheck lipids/A1c at June visit  Plan: FU 6 months   Subjective: [name] Woods is an 81 y.o. year old male who is a primary patient of [name] Woods.  The CCM team was consulted for assistance with disease management and care coordination needs.    Engaged with patient by telephone for follow up visit in response to provider referral for pharmacy case management and/or care coordination services.   Consent to Services:  The patient was given the following information about Chronic Care Management services today, agreed to services, and gave verbal consent: 1. CCM service includes personalized support from designated clinical staff supervised by the primary care provider, including individualized plan of care and coordination with other care providers 2. 24/7 contact phone numbers for assistance for urgent and routine care needs. 3. Service will only be billed when office clinical staff spend 20 minutes or more in a month to coordinate care. 4. Only one practitioner may furnish and bill the service in a calendar month. 5.The patient may stop CCM services at any time (effective at the end of the month) by phone call to the office staff. 6. The patient will be responsible for cost sharing (co-pay) of up to 20% of the service fee (after annual deductible is met). Patient agreed to services and consent obtained.  Patient Care Team: [name] Woods as PCP - General (Family Medicine) Minus Breeding, Woods as PCP - Cardiology (Cardiology) Kathie Rhodes, Woods (Inactive) as Consulting Physician (Urology) Minus Breeding, Woods as Consulting Physician  (Cardiology) Edythe Clarity, [facility] as Pharmacist (Pharmacist)  Recent office visits:  03/09/2021 VV (PCP) [name] Woods; Will start Astelin.  Sent in prescription for azithromycin   10/25/2020 VV [name] Coke, PA; Will start oral azithromycin and oral prednisone to cover for early bacterial upper respiratory infection   Recent consult visits:  02/16/2021 OV (Cardiology) Minus Breeding, Woods; no medication changes indicated.   12/16/2020 OV (Orthopedics) [name] Woods; no medication changes indicated.   Hospital visits:  None in previous 6 months  Objective:  Lab Results  Component Value Date   CREATININE 1.30 08/17/2021   BUN 21 08/17/2021   GFR 51.88 (L) 08/17/2021   GFRNONAA >60 03/09/2020   GFRAA 57 (L) 02/18/2020   NA 144 08/17/2021   K 3.4 (L) 08/17/2021   CALCIUM 9.0 08/17/2021   CO2 30 08/17/2021   GLUCOSE 85 08/17/2021    Lab Results  Component Value Date/Time   HGBA1C 6.0 08/17/2021 11:23 AM   HGBA1C 5.9 (H) 03/07/2020 12:23 AM   GFR 51.88 (L) 08/17/2021 11:23 AM   GFR 50.84 (L) 08/10/2020 02:05 PM    Last diabetic Eye exam: No results found for: "HMDIABEYEEXA"  Last diabetic Foot exam: No results found for: "HMDIABFOOTEX"   Lab Results  Component Value Date   CHOL 102 08/17/2021   HDL 35.80 (L) 08/17/2021   LDLCALC 49 08/17/2021   TRIG 88.0 08/17/2021   CHOLHDL 3 08/17/2021       Latest Ref Rng & Units 08/17/2021   11:23 AM 08/10/2020    2:05 PM 04/15/2020   11:12 AM  Hepatic Function  Total  Protein 6.0 - 8.3 g/dL 6.7  6.4  6.6   Albumin 3.5 - 5.2 g/dL 4.1  3.9  4.1   AST 0 - 37 U/L '21  19  23   ' ALT 0 - 53 U/L '13  12  14   ' Alk Phosphatase 39 - 117 U/L 72  66  87   Total Bilirubin 0.2 - 1.2 mg/dL 2.6  2.5  2.3     Lab Results  Component Value Date/Time   TSH 1.80 08/17/2021 11:23 AM   TSH 2.45 04/15/2020 11:12 AM   FREET4 1.19 02/05/2011 12:36 PM   FREET4 3.1 (H) 06/30/2009 10:37 AM       Latest Ref Rng & Units 08/17/2021    11:23 AM 08/10/2020    2:04 PM 04/15/2020   11:12 AM  CBC  WBC 4.0 - 10.5 K/uL 7.7  7.8  8.1   Hemoglobin 13.0 - 17.0 g/dL 15.8  16.1  16.7   Hematocrit 39.0 - 52.0 % 48.1  47.3  49.9   Platelets 150.0 - 400.0 K/uL 180.0  207.0  201.0     No results found for: "VD25OH"  Clinical ASCVD: No  The ASCVD Risk score (Arnett DK, et al., 2019) failed to calculate for the following reasons:   The 2019 ASCVD risk score is only valid for ages 75 to 31       09/14/2021   10:45 AM 08/17/2021   10:41 AM 09/14/2020   11:23 AM  Depression screen PHQ 2/9  Decreased Interest 0 0 0  Down, Depressed, Hopeless 0 0 0  PHQ - 2 Score 0 0 0   )  Social History   Tobacco Use  Smoking Status Never  Smokeless Tobacco Never   BP Readings from Last 3 Encounters:  09/14/21 (!) 160/70  08/17/21 136/79  02/16/21 124/70   Pulse Readings from Last 3 Encounters:  09/14/21 (!) 51  08/17/21 (!) 59  02/16/21 (!) 58   Wt Readings from Last 3 Encounters:  09/14/21 202 lb 12.8 oz (92 kg)  08/17/21 200 lb 12.8 oz (91.1 kg)  02/16/21 202 lb (91.6 kg)   BMI Readings from Last 3 Encounters:  09/14/21 27.50 kg/m  08/17/21 27.23 kg/m  02/16/21 27.40 kg/m    Assessment/Interventions: Review of patient past medical history, allergies, medications, health status, including review of consultants reports, laboratory and other test data, was performed as part of comprehensive evaluation and provision of chronic care management services.   SDOH:  (Social Determinants of Health) assessments and interventions performed: Yes  Financial Resource Strain: Low Risk  (07/25/2020)   Overall Financial Resource Strain (CARDIA)    Difficulty of Paying Living Expenses: Not hard at all   Food Insecurity: No Food Insecurity (07/25/2020)   Hunger Vital Sign    Worried About Running Out of Food in the Last Year: Never true    Ran Out of Food in the Last Year: Never true    SDOH Screenings   Alcohol Screen: Not on file   Depression (PHQ2-9): Low Risk  (09/14/2021)   Depression (PHQ2-9)    PHQ-2 Score: 0  Financial Resource Strain: Low Risk  (07/25/2020)   Overall Financial Resource Strain (CARDIA)    Difficulty of Paying Living Expenses: Not hard at all  Food Insecurity: No Food Insecurity (07/25/2020)   Hunger Vital Sign    Worried About Running Out of Food in the Last Year: Never true    Ran Out of Food in the Last Year:  Never true  Housing: Low Risk  (07/25/2020)   Housing    Last Housing Risk Score: 0  Physical Activity: Inactive (07/25/2020)   Exercise Vital Sign    Days of Exercise per Week: 0 days    Minutes of Exercise per Session: 0 min  Social Connections: Moderately Integrated (07/25/2020)   Social Connection and Isolation Panel [NHANES]    Frequency of Communication with Friends and Family: More than three times a week    Frequency of Social Gatherings with Friends and Family: More than three times a week    Attends Religious Services: 1 to 4 times per year    Active Member of [employer] or Organizations: No    Attends Archivist Meetings: Never    Marital Status: Married  Stress: No Stress Concern Present (07/25/2020)   [place]    Feeling of Stress : Not at all  Tobacco Use: Low Risk  (09/14/2021)   Patient History    Smoking Tobacco Use: Never    Smokeless Tobacco Use: Never    Passive Exposure: Not on file  Transportation Needs: No Transportation Needs (07/25/2020)   PRAPARE - Transportation    Lack of Transportation (Medical): No    Lack of Transportation (Non-Medical): No    CCM Care Plan  Allergies  Allergen Reactions   Coumadin [Warfarin] Other (See Comments)    GI bleeds    Medications Reviewed Today     Reviewed by Betti Cruz, RMA (Registered Medical Assistant) on 09/14/21 at 1047  Med List Status: <None>   Medication Order Taking? Sig Documenting Provider Last Dose Status Informant   allopurinol (ZYLOPRIM) 100 MG tablet [id number] Yes TAKE 1 TABLET EVERY DAY [name] Woods Taking Active   apixaban (ELIQUIS) 5 MG TABS tablet [id number] Yes Take 1 tablet (5 mg total) by mouth 2 (two) times daily. Minus Breeding, Woods Taking Active   atorvastatin (LIPITOR) 10 MG tablet [id number] Yes TAKE 1 TABLET EVERY DAY [name] Woods Taking Active   azelastine (ASTELIN) 0.1 % nasal spray [id number] Yes Place 2 sprays into both nostrils 2 (two) times daily. [name] Woods Taking Active   ciclopirox [facility]) 8 % solution [id number] Yes Apply topically at bedtime. Apply over nail and surrounding skin. Apply daily over previous coat. After seven (7) days, may remove with alcohol and continue cycle. Kennyth Arnold, FNP Taking Active   cyclobenzaprine (FLEXERIL) 10 MG tablet [id number] Yes Take 1 tablet (10 mg total) by mouth 3 (three) times daily as needed for muscle spasms. [name] Woods Taking Active   diltiazem [facility] CD) 240 MG 24 hr capsule [id number] Yes TAKE 1 CAPSULE EVERY Kelton Pillar, Woods Taking Active   furosemide (LASIX) 20 MG tablet [id number] Yes TAKE 1 TABLET EVERY Kelton Pillar, Woods Taking Active   HYDROcodone-acetaminophen [facility]) 10-325 MG tablet [id number] Yes Take 1 tablet by mouth every 6 (six) hours as needed. [name] Woods Taking Active   hydrOXYzine (VISTARIL) 25 MG capsule [id number] Yes Take 1 capsule (25 mg total) by mouth every 8 (eight) hours as needed for itching. [name] Woods Taking Active   metoprolol succinate (TOPROL-XL) 100 MG 24 hr tablet [id number] Yes TAKE 1 TABLET  TWO TIMES DAILY  WITH OR IMMEDIATELY FOLLOWING A MEAL. Minus Breeding, Woods Taking Active     Discontinued 04/15/20 1052   tadalafil (CIALIS) 5 MG tablet [id number] Yes Take 0.5 tablets (2.5  mg total) by mouth daily. [name] Woods Taking Active             Patient Active Problem List   Diagnosis Date Noted   B12 deficiency 09/14/2021   Dermatitis  08/17/2021   Erectile dysfunction 08/17/2021   BPH (benign prostatic hyperplasia) 08/17/2021   Paresthesia 08/17/2021   Osteoarthritis 04/15/2020   Gastric nodule    OSA (obstructive sleep apnea) 03/06/2020   Chronic atrial fibrillation ([place]) 02/12/2019   Kidney stones, calcium oxalate 12/03/2018   Hyperlipidemia 02/06/2018   Encounter for chronic pain management 03/01/2017   Hyperglycemia 04/23/2016   Chronic anticoagulation 03/02/2015   Gout 06/30/2014   CAD (coronary artery disease) 03/21/2012   GERD (gastroesophageal reflux disease) 03/21/2012   Essential hypertension 04/25/2007    Immunization History  Administered Date(s) Administered   Fluad Quad(high Dose 65+) 11/17/2018, 10/29/2019   Influenza Split 11/30/2011   Influenza Whole 11/10/2009   Influenza, High Dose Seasonal PF 12/02/2012, 11/07/2013, 11/26/2017   Influenza,inj,Quad PF,6+ Mos 10/25/2014, 11/08/2015, 10/31/2016   PFIZER(Purple Top)SARS-COV-2 Vaccination 03/07/2019, 04/01/2019   Pneumococcal Conjugate-13 05/03/2014   Pneumococcal Polysaccharide-23 03/02/2016   Td 01/30/2004   Tdap 05/03/2014    Conditions to be addressed/monitored:  HTN, CAD, Afib, GERD, HLD, Pre-DM  There are no care plans that you recently modified to display for this patient.     Medication Assistance: None required.  Patient affirms current coverage meets needs.  Compliance/Adherence/Medication fill history: Care Gaps: None noted  [facility]: Atorvastatin 10 mg last filled 02/25/2021 90 DS  Patient's preferred pharmacy is:  [facility] [address] [place] [address] Phone: [phone] Fax: [phone]  [address], [place] [address] Phone: [phone] Fax: [phone]  Uses pill box? Yes Pt endorses 100% compliance  We discussed: Benefits of medication synchronization, packaging and delivery  as well as enhanced pharmacist oversight with [employer]. Patient decided to: Continue current medication management strategy  Care Plan and Follow Up Patient Decision:  Patient agrees to Care Plan and Follow-up.  Plan: The care management team will reach out to the patient again over the next 180 days.  Beverly Milch, PharmD Clinical Pharmacist  [facility] [phone]  Current Barriers:  Increased gout attacks  Pharmacist Clinical Goal(s):  Patient will achieve improvement in gout as evidenced by symptoms through collaboration with PharmD and provider.   Interventions: 1:1 collaboration with [name] Woods regarding development and update of comprehensive plan of care as evidenced by provider attestation and co-signature Inter-disciplinary care team collaboration (see longitudinal plan of care) Comprehensive medication review performed; medication list updated in electronic medical record  Hypertension (BP goal <130/80) -Controlled -Current treatment: Metoprolol XL 155m daily Appropriate, Effective, Safe, Accessible Diltiazem CD 2469mdaily Appropriate, Query effective, -Medications previously tried: amlodipine   -Current home readings: 165/105 in the morning one day, took meds got down to 125/80 -Current dietary habits: patient eating about what he wants, 2 16 oz regular soft drinks per day.  Eating red meats -Current exercise habits: minimal, tries to walk some but has knee pain -Reports hypotensive/hypertensive symptoms - occasional headaches, will wake up and know BP is high take his meds and then it comes back down -Educated on BP goals and benefits of medications for prevention of heart attack, stroke and kidney damage; Daily salt intake goal < 2300 mg; Importance of home blood pressure monitoring; Symptoms of hypotension and  importance of maintaining adequate hydration; Limiting soft drinks due to caffeine -Counseled to monitor BP at home as current,  document, and provide log at future appointments -Recommended to continue current medication Will FU on BP - may need additional treatment depending on how frequent elevations/HA occur  Hyperlipidemia: (LDL goal < 70) -Controlled -Current treatment: Atorvastatin 3m daily Appropriate, Effective, Safe, Accessible -Medications previously tried: none noted  -Current dietary patterns: see above -Current exercise habits: see above -Educated on Cholesterol goals;  Benefits of statin for ASCVD risk reduction; Importance of limiting foods high in cholesterol; -Recommended to continue current medication Most recent LDL well controlled, has been over 1 year since last check, has upcoming OV in June would recommend recheck at this time.  Pre-Diabetes (A1c goal <6.5%) -Controlled -Current medications: None at this time -Medications previously tried: none  -Current home glucose readings fasting glucose: not checking post prandial glucose: not checking -Denies hypoglycemic/hyperglycemic symptoms -Current meal patterns: does admit to regular soft drink intake lately -Current exercise: some -Educated on A1c and blood sugar goals; Effect of sodas on blood glucose and A1c last time in pre diabetes range -Counseled to check feet daily and get yearly eye exams -Recommended recheck A1c at next OV, recommended he cut back on regular soft drink intake  Atrial Fibrillation (Goal: prevent stroke and major bleeding) -Controlled -CHADSVASC: 2 -Current treatment: Rate control: Metoprolol XL 1067mdaily Appropriate, Effective, Safe, Accessible Anticoagulation: Eliquis 36m10mwice daily Appropriate, Effective, Safe, Accessible -Medications previously tried: none noted -Home BP and HR readings: controlled  -Counseled on increased risk of stroke due to Afib and benefits of anticoagulation for stroke prevention; importance of adherence to anticoagulant exactly as prescribed; -Recommended to continue current  medication Assessed patient finances. Does admit to high copay, application for patient assistance initiated.  Counseled on 3% OOP minimum spend  GERD (Goal: [place] symptoms) -Not ideally controlled -Current treatment  None - not currently taking his PPI -Medications previously tried: pantoprazole -Does mention intermittent symptoms of acid reflux, plan to discuss with Dr. ParJerline Pain upcoming visit in June  -Recommended continue current management at this time  Patient Goals/Self-Care Activities Patient will:  - take medications as prescribed as evidenced by patient report and record review check blood pressure daily, document, and provide at future appointments  Follow Up Plan: The care management team will reach out to the patient again over the next 180 days.

## 2021-10-03 ENCOUNTER — Telehealth: Payer: Medicare HMO

## 2021-10-09 NOTE — Telephone Encounter (Signed)
Patient has been approved for assistance with eliquis.

## 2021-10-16 ENCOUNTER — Other Ambulatory Visit (HOSPITAL_COMMUNITY): Payer: Self-pay

## 2021-10-16 ENCOUNTER — Other Ambulatory Visit: Payer: Self-pay | Admitting: Family Medicine

## 2021-10-16 DIAGNOSIS — M459 Ankylosing spondylitis of unspecified sites in spine: Secondary | ICD-10-CM

## 2021-10-16 MED ORDER — HYDROCODONE-ACETAMINOPHEN 10-325 MG PO TABS
1.0000 | ORAL_TABLET | Freq: Four times a day (QID) | ORAL | 0 refills | Status: DC | PRN
  Filled 2021-10-16: qty 120, 30d supply, fill #0

## 2021-10-18 ENCOUNTER — Other Ambulatory Visit (HOSPITAL_COMMUNITY): Payer: Self-pay

## 2021-10-23 ENCOUNTER — Encounter: Payer: Self-pay | Admitting: *Deleted

## 2021-11-13 ENCOUNTER — Other Ambulatory Visit: Payer: Self-pay | Admitting: Family Medicine

## 2021-11-13 ENCOUNTER — Other Ambulatory Visit (HOSPITAL_COMMUNITY): Payer: Self-pay

## 2021-11-13 DIAGNOSIS — M459 Ankylosing spondylitis of unspecified sites in spine: Secondary | ICD-10-CM

## 2021-11-13 DIAGNOSIS — H2513 Age-related nuclear cataract, bilateral: Secondary | ICD-10-CM | POA: Diagnosis not present

## 2021-11-14 ENCOUNTER — Other Ambulatory Visit (HOSPITAL_COMMUNITY): Payer: Self-pay

## 2021-11-14 MED ORDER — HYDROCODONE-ACETAMINOPHEN 10-325 MG PO TABS
1.0000 | ORAL_TABLET | Freq: Four times a day (QID) | ORAL | 0 refills | Status: DC | PRN
  Filled 2021-11-14: qty 120, 30d supply, fill #0

## 2021-11-16 ENCOUNTER — Other Ambulatory Visit (HOSPITAL_COMMUNITY): Payer: Self-pay

## 2021-11-16 ENCOUNTER — Telehealth: Payer: Self-pay | Admitting: Family Medicine

## 2021-11-16 NOTE — Telephone Encounter (Signed)
Velta Addison w/ [facility] is needing clarification on a rx. Please call [phone].

## 2021-11-17 ENCOUNTER — Other Ambulatory Visit (HOSPITAL_COMMUNITY): Payer: Self-pay

## 2021-11-17 MED ORDER — TADALAFIL 5 MG PO TABS
5.0000 mg | ORAL_TABLET | Freq: Every day | ORAL | 1 refills | Status: DC
  Filled 2021-11-17: qty 90, 90d supply, fill #0

## 2021-11-17 NOTE — Telephone Encounter (Signed)
Called pharmacy spoke to [facility], she said pt is on Cialis 5 mg and is suppose to be taking 2.5 mg but you can not cut tablet in half. She checked to see if they could get a 2.5 mg tablet,but can not. She said pt was in yesterday and she asked him about the medication if he was cutting it in half? Pt said no he has been taking a full tablet 5 mg. Told her okay I will let Dr. [name] know and send new Rx over. Maryann verbalized understanding.   Discussed with Dr. [name], he said okay for pt to take Cialis 5 mg daily. Rx sent to pharmacy.

## 2021-11-22 ENCOUNTER — Other Ambulatory Visit (HOSPITAL_COMMUNITY): Payer: Self-pay

## 2021-11-22 ENCOUNTER — Other Ambulatory Visit: Payer: Self-pay | Admitting: Family Medicine

## 2021-11-23 ENCOUNTER — Other Ambulatory Visit (HOSPITAL_COMMUNITY): Payer: Self-pay

## 2021-11-23 MED ORDER — CYCLOBENZAPRINE HCL 10 MG PO TABS
10.0000 mg | ORAL_TABLET | Freq: Three times a day (TID) | ORAL | 3 refills | Status: DC | PRN
  Filled 2021-11-23: qty 30, 10d supply, fill #0
  Filled 2022-02-14: qty 30, 10d supply, fill #1
  Filled 2022-04-19: qty 30, 10d supply, fill #2
  Filled 2022-06-14: qty 30, 10d supply, fill #3

## 2021-11-24 ENCOUNTER — Other Ambulatory Visit (HOSPITAL_COMMUNITY): Payer: Self-pay

## 2021-12-13 ENCOUNTER — Other Ambulatory Visit (HOSPITAL_COMMUNITY): Payer: Self-pay

## 2021-12-13 ENCOUNTER — Telehealth: Payer: Self-pay | Admitting: Family Medicine

## 2021-12-13 DIAGNOSIS — M459 Ankylosing spondylitis of unspecified sites in spine: Secondary | ICD-10-CM

## 2021-12-14 ENCOUNTER — Other Ambulatory Visit (HOSPITAL_COMMUNITY): Payer: Self-pay

## 2021-12-14 MED ORDER — HYDROCODONE-ACETAMINOPHEN 10-325 MG PO TABS
1.0000 | ORAL_TABLET | Freq: Four times a day (QID) | ORAL | 0 refills | Status: DC | PRN
  Filled 2021-12-14: qty 120, 30d supply, fill #0

## 2021-12-14 NOTE — Telephone Encounter (Signed)
Patient states: - [facility] informed him that they do not have this medication in stock. - States they told him to have PCP send medication to [facility] since they have it available   Patient requests: -  MEDICATION:HYDROcodone-acetaminophen ([place]) 10-325 MG tablet   PHARMACY: [facility] [address], [place], [place] [id number]

## 2021-12-15 ENCOUNTER — Other Ambulatory Visit (HOSPITAL_COMMUNITY): Payer: Self-pay

## 2021-12-15 MED ORDER — HYDROCODONE-ACETAMINOPHEN 10-325 MG PO TABS
1.0000 | ORAL_TABLET | Freq: Four times a day (QID) | ORAL | 0 refills | Status: DC | PRN
  Filled 2021-12-15: qty 120, 30d supply, fill #0

## 2021-12-15 NOTE — Addendum Note (Signed)
Addended by: Vivi Barrack on: 12/15/2021 07:32 AM   Modules accepted: Orders

## 2022-01-11 ENCOUNTER — Encounter: Payer: Self-pay | Admitting: *Deleted

## 2022-01-15 ENCOUNTER — Telehealth: Payer: Self-pay | Admitting: Family Medicine

## 2022-01-15 ENCOUNTER — Encounter: Payer: Self-pay | Admitting: Family Medicine

## 2022-01-15 ENCOUNTER — Ambulatory Visit (INDEPENDENT_AMBULATORY_CARE_PROVIDER_SITE_OTHER): Payer: Medicare HMO | Admitting: Family Medicine

## 2022-01-15 ENCOUNTER — Other Ambulatory Visit (HOSPITAL_COMMUNITY): Payer: Self-pay

## 2022-01-15 VITALS — BP 164/70 | HR 54 | Temp 98.2°F | Ht 72.0 in | Wt 197.0 lb

## 2022-01-15 DIAGNOSIS — R1030 Lower abdominal pain, unspecified: Secondary | ICD-10-CM | POA: Diagnosis not present

## 2022-01-15 DIAGNOSIS — R739 Hyperglycemia, unspecified: Secondary | ICD-10-CM

## 2022-01-15 DIAGNOSIS — I10 Essential (primary) hypertension: Secondary | ICD-10-CM | POA: Diagnosis not present

## 2022-01-15 DIAGNOSIS — M1990 Unspecified osteoarthritis, unspecified site: Secondary | ICD-10-CM

## 2022-01-15 DIAGNOSIS — E538 Deficiency of other specified B group vitamins: Secondary | ICD-10-CM | POA: Diagnosis not present

## 2022-01-15 DIAGNOSIS — R350 Frequency of micturition: Secondary | ICD-10-CM | POA: Diagnosis not present

## 2022-01-15 DIAGNOSIS — M459 Ankylosing spondylitis of unspecified sites in spine: Secondary | ICD-10-CM

## 2022-01-15 LAB — TSH: TSH: 2.02 u[IU]/mL (ref 0.35–5.50)

## 2022-01-15 LAB — POCT URINALYSIS DIPSTICK
Bilirubin, UA: NEGATIVE
Blood, UA: NEGATIVE
Glucose, UA: NEGATIVE
Ketones, UA: NEGATIVE
Leukocytes, UA: NEGATIVE
Nitrite, UA: NEGATIVE
Protein, UA: POSITIVE — AB
Spec Grav, UA: >=1.03 — AB (ref 1.010–1.025)
Urobilinogen, UA: 0.2 E.U./dL
pH, UA: 5.5 (ref 5.0–8.0)

## 2022-01-15 LAB — COMPREHENSIVE METABOLIC PANEL
ALT: 11 U/L (ref 0–53)
AST: 18 U/L (ref 0–37)
Albumin: 4.1 g/dL (ref 3.5–5.2)
Alkaline Phosphatase: 72 U/L (ref 39–117)
BUN: 17 mg/dL (ref 6–23)
CO2: 28 mEq/L (ref 19–32)
Calcium: 9.5 mg/dL (ref 8.4–10.5)
Chloride: 103 mEq/L (ref 96–112)
Creatinine, Ser: 1.28 mg/dL (ref 0.40–1.50)
GFR: 52.7 mL/min — ABNORMAL LOW (ref >60.00)
Glucose, Bld: 102 mg/dL — ABNORMAL HIGH (ref 70–99)
Potassium: 4.1 mEq/L (ref 3.5–5.1)
Sodium: 143 mEq/L (ref 135–145)
Total Bilirubin: 2.8 mg/dL — ABNORMAL HIGH (ref 0.2–1.2)
Total Protein: 6.4 g/dL (ref 6.0–8.3)

## 2022-01-15 LAB — CBC
HCT: 47.7 % (ref 39.0–52.0)
Hemoglobin: 16 g/dL (ref 13.0–17.0)
MCHC: 33.6 g/dL (ref 30.0–36.0)
MCV: 89.5 fl (ref 78.0–100.0)
Platelets: 189 10*3/uL (ref 150.0–400.0)
RBC: 5.33 Mil/uL (ref 4.22–5.81)
RDW: 14.4 % (ref 11.5–15.5)
WBC: 7.3 10*3/uL (ref 4.0–10.5)

## 2022-01-15 LAB — URINALYSIS, ROUTINE W REFLEX MICROSCOPIC
Bilirubin Urine: NEGATIVE
Hgb urine dipstick: NEGATIVE
Ketones, ur: NEGATIVE
Leukocytes,Ua: NEGATIVE
Nitrite: NEGATIVE
RBC / HPF: NONE SEEN (ref 0–?)
Specific Gravity, Urine: >=1.03 — AB (ref 1.000–1.030)
Total Protein, Urine: 30 — AB
Urine Glucose: NEGATIVE
Urobilinogen, UA: 1 (ref 0.0–1.0)
pH: 6 (ref 5.0–8.0)

## 2022-01-15 LAB — HEMOGLOBIN A1C: Hgb A1c MFr Bld: 5.8 % (ref 4.6–6.5)

## 2022-01-15 LAB — VITAMIN B12: Vitamin B-12: >1500 pg/mL — ABNORMAL HIGH (ref 211–911)

## 2022-01-15 MED ORDER — CYANOCOBALAMIN 1000 MCG/ML IJ SOLN
1000.0000 ug | Freq: Once | INTRAMUSCULAR | Status: AC
  Administered 2022-01-15: 1000 ug via INTRAMUSCULAR

## 2022-01-15 MED ORDER — TAMSULOSIN HCL 0.4 MG PO CAPS
0.4000 mg | ORAL_CAPSULE | Freq: Every day | ORAL | 3 refills | Status: DC
  Filled 2022-01-15: qty 30, 30d supply, fill #0

## 2022-01-15 MED ORDER — HYDROCODONE-ACETAMINOPHEN 10-325 MG PO TABS
1.0000 | ORAL_TABLET | Freq: Four times a day (QID) | ORAL | 0 refills | Status: DC | PRN
  Filled 2022-01-15: qty 120, 30d supply, fill #0

## 2022-01-15 NOTE — Progress Notes (Signed)
   [name] [name] [name] is a 81 y.o. male who presents today for an office visit.  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  Has a strong personal history of this and current symptoms are consistent with prior flares.  No red flag signs or symptoms today.  Reassuring exam. Will check labs and noncontrast abdominal ultrasound to further evaluate.  May need referral to urology depending on results.  Also start Flomax 0.4 mg daily.  He has done well with this in the past.  Discussed reasons to return to care and seek emergent care.   Chronic Problems Addressed Today: Osteoarthritis Database was reviewed today without red flags.  Still has ongoing [name] [name].  He is on Norco 10-325 every 6 hours as needed.  Will refill today.  Medications help with his [name] [name] and ability perform activities of daily living.  Unable to take NSAIDs due to being anticoagulated and due to his cardiac history.  No significant side effects with medications.  He will follow-up with me in 3 to 6 months.  B12 deficiency Check B12 level today.  Has been taking oral supplement but is not sure if it is beneficial.  Hyperglycemia Check A1c.   Essential hypertension Elevated today in setting of acute [name] [name]. He has previously been well-controlled.We will continue current regimen per cardiology with diltiazem to 40 mg daily, metoprolol succinate 100 mg twice daily     Subjective:  HPI:  Patient here with left lower back [name] [name] and urinary frequency.  Started about a month ago.  He has also been having much more frequency at night. No fevers or chills. Urine has been more dark in color. He does have a history of kidney stones. Last kidney stone was a couple of years ago that was treated lithotripsy.   He has also been having left sided shoulder [name] [name]. This started a few months ago while reaching for an object. [name] [name] has come and gone. Worse  with certain motions.   See A/p for status of chronic conditions.        Objective:  Physical Exam: BP (!) 164/70   Pulse (!) 54   Temp 98.2 F (36.8 C) (Temporal)   Ht 6' (1.829 m)   Wt 197 lb (89.4 kg)   BMI 26.72 kg/m   Gen: No acute distress, resting comfortably CV: Regular rate and rhythm with no murmurs appreciated Pulm: Normal work of breathing, clear to auscultation bilaterally with no crackles, wheezes, or rhonchi MSK: - Back: No deformities.  No CVA tenderness.  Tenderness palpation along left lower lumbar paraspinal muscle groups GI: Soft, nontender, nondistended. Neuro: Grossly normal, moves all extremities Psych: Normal affect and thought content      [name] [name] [name] M. [name], MD 01/15/2022 10:14 AM

## 2022-01-15 NOTE — Assessment & Plan Note (Signed)
Database was reviewed today without red flags.  Still has ongoing pain.  He is on Norco 10-325 every 6 hours as needed.  Will refill today.  Medications help with his pain and ability perform activities of daily living.  Unable to take NSAIDs due to being anticoagulated and due to his cardiac history.  No significant side effects with medications.  He will follow-up with me in 3 to 6 months.

## 2022-01-15 NOTE — Assessment & Plan Note (Addendum)
Elevated today in setting of acute pain. He has previously been well-controlled.We will continue current regimen per cardiology with diltiazem to 40 mg daily, metoprolol succinate 100 mg twice daily

## 2022-01-15 NOTE — Assessment & Plan Note (Signed)
Check B12 level today.  Has been taking oral supplement but is not sure if it is beneficial.

## 2022-01-15 NOTE — Telephone Encounter (Signed)
Caller states: -An imaging order was placed as a STAT but there is no clinical information to review  - She needs clinical information faxed to her at [phone] as well as a phone call at [phone]  in order to properly review the patient's case  - This is very time sensitive   Reference number of case is : [id number]

## 2022-01-15 NOTE — Patient Instructions (Signed)
It was very nice to see you today!  We will check a CT scan to make sure that you do not have a kidney stone.  I will refill your medications today.  Please let me know if you would like to see sports medicine for your arthritis.  Let us know if your symptoms worsen.  Will see you back in 3 months for medication check.    Take care, Dr [name]  PLEASE NOTE:  If you had any lab tests, please let us know if you have not heard back within a few days. You may see your results on mychart before we have a chance to review them but we will give you a call once they are reviewed by [place].   If we ordered any referrals today, please let us know if you have not heard from their office within the next week.   If you had any urgent prescriptions sent in today, please check with the pharmacy within an hour of our visit to make sure the prescription was transmitted appropriately.   Please try these tips to maintain a healthy lifestyle:  Eat at least 3 REAL meals and 1-2 snacks per day.  Aim for no more than 5 hours between eating.  If you eat breakfast, please do so within one hour of getting up.   Each meal should contain half fruits/vegetables, one quarter protein, and one quarter carbs (no bigger than a computer mouse)  Cut down on sweet beverages. This includes juice, soda, and sweet tea.   Drink at least 1 glass of water with each meal and aim for at least 8 glasses per day  Exercise at least 150 minutes every week.

## 2022-01-15 NOTE — Assessment & Plan Note (Signed)
Check A1c. 

## 2022-01-16 LAB — URINE CULTURE
MICRO NUMBER:: 14328555
SPECIMEN QUALITY:: ADEQUATE

## 2022-01-16 NOTE — Progress Notes (Signed)
Please inform patient of the following:  B12 is above goal.  He can continue with his oral supplementation.  His kidney function and electrolytes are all stable.  Blood counts are all stable.  A1c is a little bit better than last time when checked about 5 months ago.  Thyroid levels are normal.  We are still waiting on his urine culture come back and also we are still waiting on the CT scan.  Do not need to make any changes to his treatment plan at this time Based on the labs we have so far.  Will contact him with the other tests once we get results back.

## 2022-01-16 NOTE — Telephone Encounter (Signed)
Send information to [facility] referral coordination

## 2022-01-18 NOTE — Progress Notes (Signed)
Please inform patient of the following:  Urine culture is negative for infection.   Can we make sure he gets his CT scan? It was ordered 3 days ago and I do not see where it has been scheduled yet.  Algis Greenhouse. [name], MD 01/18/2022 8:53 AM

## 2022-01-25 ENCOUNTER — Ambulatory Visit (HOSPITAL_COMMUNITY)
Admission: RE | Admit: 2022-01-25 | Discharge: 2022-01-25 | Disposition: A | Discharge status: Home / Self Care | Payer: Medicare HMO | Source: Ambulatory Visit | Attending: Family Medicine | Admitting: Family Medicine

## 2022-01-25 ENCOUNTER — Telehealth: Payer: Self-pay | Admitting: Family Medicine

## 2022-01-25 DIAGNOSIS — R1030 Lower abdominal pain, unspecified: Secondary | ICD-10-CM | POA: Insufficient documentation

## 2022-01-25 DIAGNOSIS — N200 Calculus of kidney: Secondary | ICD-10-CM | POA: Diagnosis not present

## 2022-01-25 NOTE — Telephone Encounter (Signed)
Please see result notes, pt has been advised and scheduled for CT 01/25/22 @ 3pm. Pt is aware of appt for today.

## 2022-01-25 NOTE — Telephone Encounter (Signed)
Pt would like a call back with lab results 

## 2022-01-26 NOTE — Progress Notes (Signed)
Please inform patient of the following:  His Ct scan does show that he has several kidney stones. If he is still having pain then recommend referral to urology.  Algis Greenhouse. [name], MD 01/26/2022 3:25 PM

## 2022-01-30 ENCOUNTER — Other Ambulatory Visit: Payer: Self-pay | Admitting: *Deleted

## 2022-01-30 DIAGNOSIS — N200 Calculus of kidney: Secondary | ICD-10-CM

## 2022-02-14 ENCOUNTER — Other Ambulatory Visit (HOSPITAL_COMMUNITY): Payer: Self-pay

## 2022-02-14 ENCOUNTER — Other Ambulatory Visit: Payer: Self-pay | Admitting: Family Medicine

## 2022-02-14 DIAGNOSIS — M459 Ankylosing spondylitis of unspecified sites in spine: Secondary | ICD-10-CM

## 2022-02-15 ENCOUNTER — Other Ambulatory Visit (HOSPITAL_COMMUNITY): Payer: Self-pay

## 2022-02-15 MED ORDER — HYDROCODONE-ACETAMINOPHEN 10-325 MG PO TABS
1.0000 | ORAL_TABLET | Freq: Four times a day (QID) | ORAL | 0 refills | Status: DC | PRN
  Filled 2022-02-15: qty 120, 30d supply, fill #0

## 2022-02-23 ENCOUNTER — Telehealth: Payer: Self-pay | Admitting: Family Medicine

## 2022-02-23 NOTE — Telephone Encounter (Signed)
Please advise 

## 2022-02-23 NOTE — Telephone Encounter (Signed)
Patient is requesting pcp send in a stronger pain medication due to kidney stone. Informed patient he could go to UC if pain worsens. Pt verbalized understanding but prefers PCP send in something.

## 2022-02-26 NOTE — Telephone Encounter (Signed)
Spoke with patient, stated has not seen urology  Advise to schedule OV with Urology soon  Verbalized understanding, will let us know if referral needed

## 2022-02-26 NOTE — Telephone Encounter (Signed)
We saw him over a month ago for this. Has he seen his urologist?  [name] Woods. [name], MD 02/26/2022 12:44 PM

## 2022-03-05 ENCOUNTER — Other Ambulatory Visit: Payer: Self-pay | Admitting: Family Medicine

## 2022-03-16 ENCOUNTER — Other Ambulatory Visit: Payer: Self-pay | Admitting: Family Medicine

## 2022-03-16 ENCOUNTER — Other Ambulatory Visit (HOSPITAL_COMMUNITY): Payer: Self-pay

## 2022-03-16 DIAGNOSIS — M459 Ankylosing spondylitis of unspecified sites in spine: Secondary | ICD-10-CM

## 2022-03-16 MED ORDER — HYDROCODONE-ACETAMINOPHEN 10-325 MG PO TABS
1.0000 | ORAL_TABLET | Freq: Four times a day (QID) | ORAL | 0 refills | Status: DC | PRN
  Filled 2022-03-16: qty 120, 30d supply, fill #0

## 2022-03-16 NOTE — Telephone Encounter (Signed)
Last refill: 02/15/22 #120, 0 Last OV: 01/15/22 dx. Urine frequency

## 2022-03-21 ENCOUNTER — Other Ambulatory Visit (HOSPITAL_COMMUNITY): Payer: Self-pay

## 2022-04-13 ENCOUNTER — Other Ambulatory Visit (HOSPITAL_COMMUNITY): Payer: Self-pay

## 2022-04-13 ENCOUNTER — Other Ambulatory Visit: Payer: Self-pay | Admitting: Family Medicine

## 2022-04-13 DIAGNOSIS — M459 Ankylosing spondylitis of unspecified sites in spine: Secondary | ICD-10-CM

## 2022-04-13 MED ORDER — HYDROCODONE-ACETAMINOPHEN 10-325 MG PO TABS
1.0000 | ORAL_TABLET | Freq: Four times a day (QID) | ORAL | 0 refills | Status: DC | PRN
  Filled 2022-04-13: qty 39, 10d supply, fill #0
  Filled 2022-04-16: qty 81, 20d supply, fill #0

## 2022-04-13 NOTE — Telephone Encounter (Signed)
Pt requesting refill for Hydrocodone 10-325 mg. Last OV 12/2021

## 2022-04-16 ENCOUNTER — Other Ambulatory Visit (HOSPITAL_COMMUNITY): Payer: Self-pay

## 2022-04-19 ENCOUNTER — Other Ambulatory Visit (HOSPITAL_COMMUNITY): Payer: Self-pay

## 2022-05-14 DIAGNOSIS — N202 Calculus of kidney with calculus of ureter: Secondary | ICD-10-CM | POA: Diagnosis not present

## 2022-05-14 DIAGNOSIS — M545 Low back pain, unspecified: Secondary | ICD-10-CM | POA: Diagnosis not present

## 2022-05-17 ENCOUNTER — Other Ambulatory Visit: Payer: Self-pay | Admitting: Family Medicine

## 2022-05-17 ENCOUNTER — Other Ambulatory Visit (HOSPITAL_COMMUNITY): Payer: Self-pay

## 2022-05-17 DIAGNOSIS — M459 Ankylosing spondylitis of unspecified sites in spine: Secondary | ICD-10-CM

## 2022-05-17 MED ORDER — HYDROCODONE-ACETAMINOPHEN 10-325 MG PO TABS
1.0000 | ORAL_TABLET | Freq: Four times a day (QID) | ORAL | 0 refills | Status: DC | PRN
Start: 2022-05-17 — End: 2022-06-14
  Filled 2022-05-17: qty 120, 30d supply, fill #0

## 2022-05-17 NOTE — Telephone Encounter (Signed)
Last refill: 04/13/22 #120, 0 Last OV: 01/15/22 dx. Urination frequency

## 2022-05-18 ENCOUNTER — Encounter: Payer: Self-pay | Admitting: Family Medicine

## 2022-05-18 ENCOUNTER — Ambulatory Visit (INDEPENDENT_AMBULATORY_CARE_PROVIDER_SITE_OTHER): Payer: Medicare HMO | Admitting: Family Medicine

## 2022-05-18 ENCOUNTER — Ambulatory Visit: Payer: Medicare HMO | Admitting: Family

## 2022-05-18 ENCOUNTER — Other Ambulatory Visit (HOSPITAL_COMMUNITY): Payer: Self-pay

## 2022-05-18 VITALS — BP 150/62 | HR 57 | Temp 97.8°F | Resp 16 | Ht 71.0 in | Wt 195.8 lb

## 2022-05-18 DIAGNOSIS — R059 Cough, unspecified: Secondary | ICD-10-CM | POA: Diagnosis not present

## 2022-05-18 DIAGNOSIS — I10 Essential (primary) hypertension: Secondary | ICD-10-CM

## 2022-05-18 DIAGNOSIS — I2510 Atherosclerotic heart disease of native coronary artery without angina pectoris: Secondary | ICD-10-CM

## 2022-05-18 DIAGNOSIS — M25512 Pain in left shoulder: Secondary | ICD-10-CM | POA: Diagnosis not present

## 2022-05-18 MED ORDER — AZITHROMYCIN 250 MG PO TABS
ORAL_TABLET | ORAL | 0 refills | Status: DC
  Filled 2022-05-18: qty 6, 5d supply, fill #0

## 2022-05-18 NOTE — Patient Instructions (Signed)
It was very nice to see you today!  Please start the azithromycin.  Make sure that you are getting plenty of fluids.  Let me know if not improving by next week.  You probably strained your rotator cuff.  Work on the exercises.  Let us know if not improving and we can refer you to see sports medicine or physical therapy.  Please keep an eye on your blood pressure and let us know if it is persistently elevated at home.  Take care, Dr Jimmey Ralph  PLEASE NOTE:  If you had any lab tests, please let us know if you have not heard back within a few days. You may see your results on mychart before we have a chance to review them but we will give you a call once they are reviewed by [place].   If we ordered any referrals today, please let us know if you have not heard from their office within the next week.   If you had any urgent prescriptions sent in today, please check with the pharmacy within an hour of our visit to make sure the prescription was transmitted appropriately.   Please try these tips to maintain a healthy lifestyle:  Eat at least 3 REAL meals and 1-2 snacks per day.  Aim for no more than 5 hours between eating.  If you eat breakfast, please do so within one hour of getting up.   Each meal should contain half fruits/vegetables, one quarter protein, and one quarter carbs (no bigger than a computer mouse)  Cut down on sweet beverages. This includes juice, soda, and sweet tea.   Drink at least 1 glass of water with each meal and aim for at least 8 glasses per day  Exercise at least 150 minutes every week.

## 2022-05-18 NOTE — Assessment & Plan Note (Addendum)
Mildly elevated today in setting of acute pain and acute illness.  He has previously been well-controlled.  He will monitor at home.  Hopefully will have some improvement as we treat his illness and injury as above.  He will continue diltiazem 240 mg daily and metoprolol succinate 100 mg twice daily per cardiology.  Advised him to follow-up with them soon as he has not been seen in quite a while.

## 2022-05-18 NOTE — Progress Notes (Signed)
   [name] Woods is a 82 y.o. male who presents today for an office visit.  Assessment/Plan:  New/Acute Problems: Cough Concern for early pneumonia or developing bronchitis.  His oxygen saturation is below baseline at 94% and he does have some rhonchi on exam.  Due to his age and comorbidities would be reasonable to empirically treat with course of antibiotics at this point with azithromycin.  He can continue over-the-counter meds.  His vital signs are otherwise within normal limits and he has no other signs of respiratory distress-do not think he needs to go to the ED at this point.  We encouraged hydration.  He will let us know if not improving in the next few days.  We discussed reasons to return to care and seek emergent care.  Left shoulder pain Concern for possible rotator cuff injury.  We discussed referral to PT or sports medicine however he declined.  We discussed home exercise program and handout was given.  He will let me know if not improving by next week and will refer at that time.  Chronic Problems Addressed Today: Essential hypertension Mildly elevated today in setting of acute pain and acute illness.  He has previously been well-controlled.  He will monitor at home.  Hopefully will have some improvement as we treat his illness and injury as above.  He will continue diltiazem 240 mg daily and metoprolol succinate 100 mg twice daily per cardiology.  Advised him to follow-up with them soon as he has not been seen in quite a while.  CAD (coronary artery disease) On statin. Advised him to follow up with cardiology soon.      Subjective:  HPI:  See A/p for status of chronic conditions.  His main concern today is cough n. This started about 4 days ago. Tried OTC medications without much improvement. Did start having  diarrhea this morning. Some low grade fever. No known sick contacts. Covid test at home negative x 2. Getting worse the last couple of days.  Cough is productive  of thick yellow sputum.  He also injured his left shoulder a few days ago.  Fell at home and caught himself with his left arm.  Has had trouble with range of motion in his left shoulder since then.  Over-the-counter medications have only given modest improvement.       Objective:  Physical Exam: BP (!) 150/62   Pulse (!) 57   Temp 97.8 F (36.6 C) (Temporal)   Resp 16   Ht  (1.803 m)   Wt 195 lb 12.8 oz (88.8 kg)   SpO2 94%   BMI 27.31 kg/m   Gen: No acute distress, resting comfortably CV: Regular rate and rhythm with no murmurs appreciated Pulm: Minimally increased work of breathing.  Speaking in full sentences.  Rhonchi and crackles noted at left base. MSK - Left Shoulder: No deformities.  Very tender to palpation.  Limited range of motion secondary to pain.  Pain elicited with resisted supraspinatus testing. Neuro: Grossly normal, moves all extremities Psych: Normal affect and thought content      [name], MD 05/18/2022 3:10 PM

## 2022-05-18 NOTE — Assessment & Plan Note (Signed)
On statin. Advised him to follow up with cardiology soon.

## 2022-05-21 ENCOUNTER — Emergency Department (HOSPITAL_BASED_OUTPATIENT_CLINIC_OR_DEPARTMENT_OTHER): Payer: Medicare HMO

## 2022-05-21 ENCOUNTER — Encounter (HOSPITAL_BASED_OUTPATIENT_CLINIC_OR_DEPARTMENT_OTHER): Payer: Self-pay | Admitting: Urology

## 2022-05-21 ENCOUNTER — Observation Stay (HOSPITAL_BASED_OUTPATIENT_CLINIC_OR_DEPARTMENT_OTHER)
Admission: EM | Admit: 2022-05-21 | Discharge: 2022-05-22 | Disposition: A | Discharge status: Home / Self Care | Payer: Medicare HMO | Attending: Internal Medicine | Admitting: Internal Medicine

## 2022-05-21 ENCOUNTER — Other Ambulatory Visit: Payer: Self-pay

## 2022-05-21 ENCOUNTER — Ambulatory Visit: Payer: Medicare HMO | Admitting: Family Medicine

## 2022-05-21 DIAGNOSIS — Z1152 Encounter for screening for COVID-19: Secondary | ICD-10-CM | POA: Insufficient documentation

## 2022-05-21 DIAGNOSIS — Z79899 Other long term (current) drug therapy: Secondary | ICD-10-CM | POA: Diagnosis not present

## 2022-05-21 DIAGNOSIS — I110 Hypertensive heart disease with heart failure: Secondary | ICD-10-CM | POA: Diagnosis not present

## 2022-05-21 DIAGNOSIS — R0602 Shortness of breath: Secondary | ICD-10-CM | POA: Diagnosis not present

## 2022-05-21 DIAGNOSIS — I5022 Chronic systolic (congestive) heart failure: Secondary | ICD-10-CM | POA: Insufficient documentation

## 2022-05-21 DIAGNOSIS — E872 Acidosis, unspecified: Secondary | ICD-10-CM | POA: Diagnosis not present

## 2022-05-21 DIAGNOSIS — B9781 Human metapneumovirus as the cause of diseases classified elsewhere: Secondary | ICD-10-CM | POA: Diagnosis not present

## 2022-05-21 DIAGNOSIS — J189 Pneumonia, unspecified organism: Secondary | ICD-10-CM | POA: Diagnosis not present

## 2022-05-21 DIAGNOSIS — R053 Chronic cough: Secondary | ICD-10-CM | POA: Insufficient documentation

## 2022-05-21 DIAGNOSIS — R0600 Dyspnea, unspecified: Secondary | ICD-10-CM | POA: Insufficient documentation

## 2022-05-21 DIAGNOSIS — I482 Chronic atrial fibrillation, unspecified: Secondary | ICD-10-CM | POA: Insufficient documentation

## 2022-05-21 DIAGNOSIS — Z7901 Long term (current) use of anticoagulants: Secondary | ICD-10-CM | POA: Insufficient documentation

## 2022-05-21 DIAGNOSIS — J123 Human metapneumovirus pneumonia: Principal | ICD-10-CM | POA: Insufficient documentation

## 2022-05-21 DIAGNOSIS — R2689 Other abnormalities of gait and mobility: Secondary | ICD-10-CM | POA: Insufficient documentation

## 2022-05-21 DIAGNOSIS — J208 Acute bronchitis due to other specified organisms: Secondary | ICD-10-CM | POA: Diagnosis not present

## 2022-05-21 DIAGNOSIS — R059 Cough, unspecified: Secondary | ICD-10-CM | POA: Diagnosis not present

## 2022-05-21 DIAGNOSIS — I2510 Atherosclerotic heart disease of native coronary artery without angina pectoris: Secondary | ICD-10-CM | POA: Diagnosis not present

## 2022-05-21 DIAGNOSIS — S0990XA Unspecified injury of head, initial encounter: Secondary | ICD-10-CM | POA: Diagnosis not present

## 2022-05-21 LAB — CBC WITH DIFFERENTIAL/PLATELET
Abs Immature Granulocytes: 0.02 10*3/uL (ref 0.00–0.07)
Basophils Absolute: 0 10*3/uL (ref 0.0–0.1)
Basophils Relative: 1 %
Eosinophils Absolute: 0.2 10*3/uL (ref 0.0–0.5)
Eosinophils Relative: 2 %
HCT: 48.1 % (ref 39.0–52.0)
Hemoglobin: 15.6 g/dL (ref 13.0–17.0)
Immature Granulocytes: 0 %
Lymphocytes Relative: 27 %
Lymphs Abs: 1.9 10*3/uL (ref 0.7–4.0)
MCH: 29.3 pg (ref 26.0–34.0)
MCHC: 32.4 g/dL (ref 30.0–36.0)
MCV: 90.4 fL (ref 80.0–100.0)
Monocytes Absolute: 0.4 10*3/uL (ref 0.1–1.0)
Monocytes Relative: 6 %
Neutro Abs: 4.7 10*3/uL (ref 1.7–7.7)
Neutrophils Relative %: 64 %
Platelets: 202 10*3/uL (ref 150–400)
RBC: 5.32 MIL/uL (ref 4.22–5.81)
RDW: 14 % (ref 11.5–15.5)
WBC: 7.2 10*3/uL (ref 4.0–10.5)
nRBC: 0 % (ref 0.0–0.2)

## 2022-05-21 LAB — COMPREHENSIVE METABOLIC PANEL
ALT: 15 U/L (ref 0–44)
AST: 30 U/L (ref 15–41)
Albumin: 3.8 g/dL (ref 3.5–5.0)
Alkaline Phosphatase: 87 U/L (ref 38–126)
Anion gap: 7 (ref 5–15)
BUN: 16 mg/dL (ref 8–23)
CO2: 27 mmol/L (ref 22–32)
Calcium: 8.8 mg/dL — ABNORMAL LOW (ref 8.9–10.3)
Chloride: 104 mmol/L (ref 98–111)
Creatinine, Ser: 1.14 mg/dL (ref 0.61–1.24)
GFR, Estimated: >60 mL/min (ref >60)
Glucose, Bld: 99 mg/dL (ref 70–99)
Potassium: 3.8 mmol/L (ref 3.5–5.1)
Sodium: 138 mmol/L (ref 135–145)
Total Bilirubin: 2 mg/dL — ABNORMAL HIGH (ref 0.3–1.2)
Total Protein: 7 g/dL (ref 6.5–8.1)

## 2022-05-21 LAB — SARS CORONAVIRUS 2 BY RT PCR: SARS Coronavirus 2 by RT PCR: NEGATIVE

## 2022-05-21 LAB — TROPONIN I (HIGH SENSITIVITY)
Troponin I (High Sensitivity): 14 ng/L (ref <18)
Troponin I (High Sensitivity): 14 ng/L (ref <18)

## 2022-05-21 LAB — PROTIME-INR
INR: 1.2 (ref 0.8–1.2)
Prothrombin Time: 15.3 seconds — ABNORMAL HIGH (ref 11.4–15.2)

## 2022-05-21 LAB — BRAIN NATRIURETIC PEPTIDE: B Natriuretic Peptide: 392 pg/mL — ABNORMAL HIGH (ref 0.0–100.0)

## 2022-05-21 LAB — LACTIC ACID, PLASMA: Lactic Acid, Venous: 1.8 mmol/L (ref 0.5–1.9)

## 2022-05-21 MED ORDER — ATORVASTATIN CALCIUM 10 MG PO TABS
10.0000 mg | ORAL_TABLET | Freq: Every day | ORAL | Status: DC
  Administered 2022-05-22: 10 mg via ORAL
  Filled 2022-05-21: qty 1

## 2022-05-21 MED ORDER — ALLOPURINOL 100 MG PO TABS
100.0000 mg | ORAL_TABLET | Freq: Every day | ORAL | Status: DC
  Administered 2022-05-22: 100 mg via ORAL
  Filled 2022-05-21: qty 1

## 2022-05-21 MED ORDER — VITAMIN B-12 1000 MCG PO TABS
1000.0000 ug | ORAL_TABLET | Freq: Every day | ORAL | Status: DC
  Administered 2022-05-22: 1000 ug via ORAL
  Filled 2022-05-21: qty 1

## 2022-05-21 MED ORDER — HYDROXYZINE HCL 25 MG PO TABS: 25.0000 mg | ORAL_TABLET | Freq: Three times a day (TID) | ORAL | Status: DC | PRN

## 2022-05-21 MED ORDER — ONDANSETRON HCL 4 MG/2ML IJ SOLN: 4.0000 mg | Freq: Four times a day (QID) | INTRAMUSCULAR | Status: DC | PRN

## 2022-05-21 MED ORDER — TAMSULOSIN HCL 0.4 MG PO CAPS: 0.4000 mg | ORAL_CAPSULE | Freq: Every day | ORAL | Status: DC

## 2022-05-21 MED ORDER — SODIUM CHLORIDE 0.9 % IV BOLUS: 1000.0000 mL | Freq: Once | INTRAVENOUS | Status: DC

## 2022-05-21 MED ORDER — DOXYCYCLINE HYCLATE 100 MG PO TABS
100.0000 mg | ORAL_TABLET | Freq: Two times a day (BID) | ORAL | Status: DC
  Administered 2022-05-21 – 2022-05-22 (×2): 100 mg via ORAL
  Filled 2022-05-21 (×2): qty 1

## 2022-05-21 MED ORDER — GUAIFENESIN ER 600 MG PO TB12
1200.0000 mg | ORAL_TABLET | Freq: Two times a day (BID) | ORAL | Status: DC
  Administered 2022-05-21 – 2022-05-22 (×2): 1200 mg via ORAL
  Filled 2022-05-21 (×2): qty 2

## 2022-05-21 MED ORDER — ONDANSETRON HCL 4 MG PO TABS: 4.0000 mg | ORAL_TABLET | Freq: Four times a day (QID) | ORAL | Status: DC | PRN

## 2022-05-21 MED ORDER — METOPROLOL SUCCINATE ER 100 MG PO TB24
100.0000 mg | ORAL_TABLET | Freq: Every day | ORAL | Status: DC
  Administered 2022-05-22: 100 mg via ORAL
  Filled 2022-05-21: qty 1

## 2022-05-21 MED ORDER — DOXYCYCLINE HYCLATE 100 MG PO TABS
100.0000 mg | ORAL_TABLET | Freq: Once | ORAL | Status: AC
  Administered 2022-05-21: 100 mg via ORAL
  Filled 2022-05-21: qty 1

## 2022-05-21 MED ORDER — ACETAMINOPHEN 325 MG PO TABS: 650.0000 mg | ORAL_TABLET | Freq: Four times a day (QID) | ORAL | Status: DC | PRN

## 2022-05-21 MED ORDER — ORAL CARE MOUTH RINSE: 15.0000 mL | OROMUCOSAL | Status: DC | PRN

## 2022-05-21 MED ORDER — APIXABAN 5 MG PO TABS
5.0000 mg | ORAL_TABLET | Freq: Two times a day (BID) | ORAL | Status: DC
  Administered 2022-05-21 – 2022-05-22 (×2): 5 mg via ORAL
  Filled 2022-05-21 (×2): qty 1

## 2022-05-21 MED ORDER — METHYLPREDNISOLONE SODIUM SUCC 125 MG IJ SOLR
60.0000 mg | Freq: Once | INTRAMUSCULAR | Status: AC
  Administered 2022-05-21: 60 mg via INTRAVENOUS
  Filled 2022-05-21: qty 2

## 2022-05-21 MED ORDER — HYDROXYZINE PAMOATE 25 MG PO CAPS: 25.0000 mg | ORAL_CAPSULE | Freq: Three times a day (TID) | ORAL | Status: DC | PRN

## 2022-05-21 MED ORDER — HYDROCODONE-ACETAMINOPHEN 10-325 MG PO TABS: 1.0000 | ORAL_TABLET | Freq: Four times a day (QID) | ORAL | Status: DC | PRN

## 2022-05-21 MED ORDER — IOHEXOL 350 MG/ML SOLN
100.0000 mL | Freq: Once | INTRAVENOUS | Status: AC | PRN
  Administered 2022-05-21: 100 mL via INTRAVENOUS

## 2022-05-21 MED ORDER — SODIUM CHLORIDE 0.9 % IV SOLN
2.0000 g | INTRAVENOUS | Status: DC
  Administered 2022-05-22: 2 g via INTRAVENOUS
  Filled 2022-05-21: qty 20

## 2022-05-21 MED ORDER — ACETAMINOPHEN 650 MG RE SUPP: 650.0000 mg | Freq: Four times a day (QID) | RECTAL | Status: DC | PRN

## 2022-05-21 MED ORDER — HYDROMORPHONE HCL 1 MG/ML IJ SOLN: 0.5000 mg | INTRAMUSCULAR | Status: DC | PRN

## 2022-05-21 MED ORDER — LEVALBUTEROL HCL 0.63 MG/3ML IN NEBU
0.6300 mg | INHALATION_SOLUTION | Freq: Four times a day (QID) | RESPIRATORY_TRACT | Status: DC
  Administered 2022-05-21: 0.63 mg via RESPIRATORY_TRACT
  Filled 2022-05-21: qty 3

## 2022-05-21 MED ORDER — HYDRALAZINE HCL 20 MG/ML IJ SOLN: 10.0000 mg | Freq: Four times a day (QID) | INTRAMUSCULAR | Status: DC | PRN

## 2022-05-21 MED ORDER — SODIUM CHLORIDE 0.9 % IV BOLUS
500.0000 mL | Freq: Once | INTRAVENOUS | Status: AC
  Administered 2022-05-21: 500 mL via INTRAVENOUS

## 2022-05-21 MED ORDER — IPRATROPIUM BROMIDE 0.02 % IN SOLN
0.5000 mg | Freq: Four times a day (QID) | RESPIRATORY_TRACT | Status: DC
  Administered 2022-05-21: 0.5 mg via RESPIRATORY_TRACT
  Filled 2022-05-21: qty 2.5

## 2022-05-21 MED ORDER — SODIUM CHLORIDE 0.9 % IV SOLN
1.0000 g | Freq: Once | INTRAVENOUS | Status: AC
  Administered 2022-05-21: 1 g via INTRAVENOUS
  Filled 2022-05-21: qty 10

## 2022-05-21 MED ORDER — DILTIAZEM HCL ER COATED BEADS 240 MG PO CP24
240.0000 mg | ORAL_CAPSULE | Freq: Every day | ORAL | Status: DC
  Administered 2022-05-22: 240 mg via ORAL
  Filled 2022-05-21: qty 1

## 2022-05-21 MED ORDER — IPRATROPIUM-ALBUTEROL 0.5-2.5 (3) MG/3ML IN SOLN
3.0000 mL | Freq: Once | RESPIRATORY_TRACT | Status: AC
  Administered 2022-05-21: 3 mL via RESPIRATORY_TRACT
  Filled 2022-05-21: qty 3

## 2022-05-21 NOTE — ED Notes (Signed)
Patient requested a flutter and was given one per RT protocol

## 2022-05-21 NOTE — ED Notes (Signed)
Pt transported via [facility] to [facility]

## 2022-05-21 NOTE — Progress Notes (Signed)
RT ambulated patient with pulse ox. No SOB noted. SAT did not go lower than 96%

## 2022-05-21 NOTE — ED Notes (Signed)
Patient transported to CT 

## 2022-05-21 NOTE — H&P (Signed)
History and Physical    Patient: [name] Woods:[id number] DOB: 1940/05/04 DOA: 05/21/2022 DOS: the patient was seen and examined on 05/21/2022 PCP: Ardith Dark, MD  Patient coming from: Home  Chief Complaint:  Chief Complaint  Patient presents with   Cough   HPI: [name] Woods is a 82 y.o. male with medical history significant for but not limited to anxiety depression, arthritis, history of CAD with a reduced EF, history of dysrhythmia and atrial fibrillation, GERD, hypertension, hyperlipidemia as well as other comorbidities who presents to the hospital with worsening shortness of breath and cough.  Patient also has some diarrhea noted.  Patient states that Monday night he started developing a cough and progressively gotten worse.  States he became more productive and states it was clear and then became thick but never changed color.  Cough persisted and he saw his PCP on Friday and was prescribed azithromycin but did not improve.  States that he started developing diarrhea on Wednesday prior to getting antibiotics and has been having loose stools but they are not watery.  Patient states that he took Imodium for and loose stools have now stopped.  He continues have a persistent cough but states that he is feeling better.  He got extremely short of breath and because he failed to improve with antibiotics after seeing his PCP presented to the ED for further evaluation at [facility].  He was treated for his cough and rhonchi but given his failure of improvement and feeling of worse breathing and difficulty laying flat and generalized weakness he is transferred to [facility] for further admission.  Patient felt that there was "firecrackers" or  "pops" his chest when he tried to breathe.  Denies abdominal pain.  Denies any leg swelling, or any exposure to sick contacts.  Further workup was done and he was found to have an atypical pneumonia.  Tried hospitalist was asked to admit this  patient for atypical pneumonia failed outpatient treatment.  Review of Systems: As mentioned in the history of present illness. All other systems reviewed and are negative. Past Medical History:  Diagnosis Date   Ankylosing spondylitis    Anxiety and depression 09/12/2015   Aortic atherosclerosis    Arthritis    Bilateral inguinal hernia    CAD (coronary artery disease) 8119,1478   30% mid LAD lesion on cardiac cath   Chicken pox    Cholelithiasis    CHOLELITHIASIS, HX OF 04/25/2007   Qualifier: Diagnosis of  By: Alesia Richards     Diverticulosis 11/26/2018   Moderate, Left Noted on Colonoscopy   Dysrhythmia    atrial fibrillation   Gallstones    GERD (gastroesophageal reflux disease)    German measles    Hiatal hernia    History of colon polyps 11/26/2018   History of kidney stones    HLD (hyperlipidemia)    HTN (hypertension)    Hypertension    Nephrolithiasis    NEPHROLITHIASIS, HX OF 04/25/2007   Qualifier: Diagnosis of  By: Alesia Richards     Partial small bowel obstruction 03/06/2020   Persistent atrial fibrillation    Sleep apnea    No CPAP   Tubular adenoma of colon 03/1993   Ventricular hypertrophy    Past Surgical History:  Procedure Laterality Date   BIOPSY  03/08/2020   Procedure: BIOPSY;  Surgeon: Benancio Deeds, MD;  Location: [facility] ENDOSCOPY;  Service: Gastroenterology;;   CARDIAC CATHETERIZATION  03/2012   30%  mid LAD lesion otherwise normal cors, LVEF 65%   CHOLECYSTECTOMY     COLONOSCOPY  11/26/2018   CYSTOSCOPY WITH RETROGRADE PYELOGRAM, URETEROSCOPY AND STENT PLACEMENT Right 01/02/2019   Procedure: CYSTOSCOPY WITH RETROGRADE PYELOGRAM, URETEROSCOPY AND STENT PLACEMENT;  Surgeon: Sebastian Ache, MD;  Location: [facility] ORS;  Service: Urology;  Laterality: Right;  1 HR   ELBOW ARTHROPLASTY Left    ESOPHAGOGASTRODUODENOSCOPY (EGD) WITH PROPOFOL N/A 03/08/2020   Procedure: ESOPHAGOGASTRODUODENOSCOPY (EGD) WITH PROPOFOL;  Surgeon: Benancio Deeds,  MD;  Location: [facility] ENDOSCOPY;  Service: Gastroenterology;  Laterality: N/A;   HAND SURGERY Left    HOLMIUM LASER APPLICATION Right 01/02/2019   Procedure: HOLMIUM LASER APPLICATION;  Surgeon: Sebastian Ache, MD;  Location: [facility] ORS;  Service: Urology;  Laterality: Right;   KNEE SURGERY     x2 right   LEFT AND RIGHT HEART CATHETERIZATION WITH CORONARY ANGIOGRAM N/A 03/24/2012   Procedure: LEFT AND RIGHT HEART CATHETERIZATION WITH CORONARY ANGIOGRAM;  Surgeon: Tonny Bollman, MD;  Location: [facility] CATH LAB;  Service: Cardiovascular;  Laterality: N/A;   LEG FLUID REMOVAL RIGHT     TONSILLECTOMY     UPPER GI ENDOSCOPY     Social History:  reports that he has never smoked. He has never used smokeless tobacco. He reports that he does not drink alcohol and does not use drugs.  Allergies  Allergen Reactions   Coumadin [Warfarin] Other (See Comments)    GI bleeds    Family History  Problem Relation Age of Onset   Heart disease Father 7       Deceased   Heart attack Father 65   Heart disease Brother 66       "Died in his sleep"   Arthritis/Rheumatoid Mother        Deceased-86   Hyperlipidemia Brother    Stroke Brother 71       Deceased   Other Sister        Air cabin crew in Home   Other Sister        MVA   Other Brother        Carjacked-killed   Dementia Paternal Grandmother    Cancer Paternal Uncle    Healthy Brother    Healthy Son        X3   Healthy Daughter        x1   Colon cancer Neg Hx    Esophageal cancer Neg Hx    Stomach cancer Neg Hx    Rectal cancer Neg Hx     Prior to Admission medications   Medication Sig Start Date End Date Taking? Authorizing Provider  allopurinol (ZYLOPRIM) 100 MG tablet TAKE 1 TABLET EVERY DAY 03/05/22   Ardith Dark, MD  apixaban (ELIQUIS) 5 MG TABS tablet Take 1 tablet (5 mg total) by mouth 2 (two) times daily. 04/13/21   Rollene Rotunda, MD  atorvastatin (LIPITOR) 10 MG tablet TAKE 1 TABLET EVERY DAY 03/05/22   Ardith Dark, MD  azelastine  (ASTELIN) 0.1 % nasal spray Place 2 sprays into both nostrils 2 (two) times daily. 03/09/21   Ardith Dark, MD  azithromycin (ZITHROMAX) 250 MG tablet take 2 tablets by mouth today, then 1 tablet a day for 4 days. 05/18/22   Ardith Dark, MD  ciclopirox [facility]) 8 % solution Apply topically at bedtime. Apply over nail and surrounding skin. Apply daily over previous coat. After seven (7) days, may remove with alcohol and continue cycle. 07/14/20   Eulis Foster, FNP  cyanocobalamin (VITAMIN B12) 1000 MCG tablet Take 1 tablet (1,000 mcg total) by mouth daily. 09/14/21   Ardith Dark, MD  cyclobenzaprine (FLEXERIL) 10 MG tablet Take 1 tablet (10 mg total) by mouth 3 (three) times daily as needed for muscle spasms. 11/23/21   Ardith Dark, MD  diltiazem [facility]) 240 MG 24 hr capsule TAKE 1 CAPSULE EVERY DAY 06/27/21   Rollene Rotunda, MD  furosemide (LASIX) 20 MG tablet TAKE 1 TABLET EVERY DAY 06/27/21   Rollene Rotunda, MD  HYDROcodone-acetaminophen (NORCO) 10-325 MG tablet Take 1 tablet by mouth every 6 (six) hours as needed. 05/17/22   Ardith Dark, MD  hydrOXYzine (VISTARIL) 25 MG capsule Take 1 capsule (25 mg total) by mouth every 8 (eight) hours as needed for itching. 08/17/21   Ardith Dark, MD  metoprolol succinate (TOPROL-XL) 100 MG 24 hr tablet TAKE 1 TABLET  TWO TIMES DAILY  WITH OR IMMEDIATELY FOLLOWING A MEAL. 05/10/21   Rollene Rotunda, MD  tamsulosin (FLOMAX) 0.4 MG CAPS capsule Take 1 capsule (0.4 mg total) by mouth daily. 01/15/22   Ardith Dark, MD  pantoprazole (PROTONIX) 40 MG tablet Take 1 tablet (40 mg total) by mouth daily. 03/09/20 04/15/20  Kathlen Mody, MD    Physical Exam: Vitals:   05/21/22 1453 05/21/22 1500 05/21/22 1530 05/21/22 1648  BP:  (!) 166/83 (!) 176/86 (!) 169/85  Pulse: 69 (!) 58 71 60  Resp: (!) 22  Temp: 97.8 F (36.6 C)   98.2 F (36.8 C)  TempSrc: Oral   Oral  SpO2: 96% 96% 95% 97%  Weight:      Height:        Examination: Physical Exam:  Constitutional: WN/WD slightly overweight Caucasian male in no acute distress Respiratory: Diminished to auscultation bilaterally, no wheezing, rales, rhonchi or crackles. Normal respiratory effort and patient is not tachypenic. No accessory muscle use.  Unlabored breathing Cardiovascular: RRR, no murmurs / rubs / gallops. S1 and S2 auscultated. No extremity edema.  Abdomen: Soft, non-tender, distended secondary body habitus. Bowel sounds positive.  GU: Deferred. Musculoskeletal: No clubbing / cyanosis of digits/nails. No joint deformity upper and lower extremities.  Skin: No rashes, lesions, ulcers limited skin evaluation. No induration; Warm and dry.  Neurologic: CN 2-12 grossly intact with no focal deficits. Romberg sign and cerebellar reflexes not assessed.  Psychiatric: Normal judgment and insight. Alert and oriented x 3. Normal mood and appropriate affect.   Data Reviewed:  Recent Results (from the past 2160 hour(s))  CBC with Differential     Status: None   Collection Time: 05/21/22 11:03 AM  Result Value Ref Range   WBC 7.2 4.0 - 10.5 K/uL   RBC 5.32 4.22 - 5.81 MIL/uL   Hemoglobin 15.6 13.0 - 17.0 g/dL   HCT 16.1 09.6 - 04.5 %   MCV 90.4 80.0 - 100.0 fL   MCH 29.3 26.0 - 34.0 pg   MCHC 32.4 30.0 - 36.0 g/dL   RDW 40.9 81.1 - 91.4 %   Platelets 202 150 - 400 K/uL   nRBC 0.0 0.0 - 0.2 %   Neutrophils Relative % 64 %   Neutro Abs 4.7 1.7 - 7.7 K/uL   Lymphocytes Relative 27 %   Lymphs Abs 1.9 0.7 - 4.0 K/uL   Monocytes Relative 6 %   Monocytes Absolute 0.4 0.1 - 1.0 K/uL   Eosinophils Relative 2 %   Eosinophils Absolute 0.2 0.0 - 0.5 K/uL  Basophils Relative 1 %   Basophils Absolute 0.0 0.0 - 0.1 K/uL   Immature Granulocytes 0 %   Abs Immature Granulocytes 0.02 0.00 - 0.07 K/uL    Comment: Performed at [facility], [address] [place], [place] [address]  Comprehensive metabolic panel     Status: Abnormal   Collection  Time: 05/21/22 11:03 AM  Result Value Ref Range   Sodium 138 135 - 145 mmol/L   Potassium 3.8 3.5 - 5.1 mmol/L   Chloride 104 98 - 111 mmol/L   CO2 27 22 - 32 mmol/L   Glucose, Bld 99 70 - 99 mg/dL    Comment: Glucose reference range applies only to samples taken after fasting for at least 8 hours.   BUN 16 8 - 23 mg/dL   Creatinine, Ser 6.04 0.61 - 1.24 mg/dL   Calcium 8.8 (L) 8.9 - 10.3 mg/dL   Total Protein 7.0 6.5 - 8.1 g/dL   Albumin 3.8 3.5 - 5.0 g/dL   AST 30 15 - 41 U/L   ALT 15 0 - 44 U/L   Alkaline Phosphatase 87 38 - 126 U/L   Total Bilirubin 2.0 (H) 0.3 - 1.2 mg/dL   GFR, Estimated >54 >09 mL/min    Comment: (NOTE) Calculated using the CKD-EPI Creatinine Equation (2021)    Anion gap 7 5 - 15    Comment: Performed at [facility], [address] [place], [place] [address]  Troponin I (High Sensitivity)     Status: None   Collection Time: 05/21/22 11:03 AM  Result Value Ref Range   Troponin I (High Sensitivity) 14 <18 ng/L    Comment: (NOTE) Elevated high sensitivity troponin I (hsTnI) values and significant  changes across serial measurements may suggest ACS but many other  chronic and acute conditions are known to elevate hsTnI results.  Refer to the "Links" section for chest pain algorithms and additional  guidance. Performed at [facility], [address] [place], [place] [address]   Brain natriuretic peptide     Status: Abnormal   Collection Time: 05/21/22 11:03 AM  Result Value Ref Range   B Natriuretic Peptide 392.0 (H) 0.0 - 100.0 pg/mL    Comment: Performed at [facility], [address] [place], [place] [address]  Protime-INR     Status: Abnormal   Collection Time: 05/21/22 11:03 AM  Result Value Ref Range   Prothrombin Time 15.3 (H) 11.4 - 15.2 seconds   INR 1.2 0.8 - 1.2    Comment: (NOTE) INR goal varies based on device and disease states. Performed at [facility], [address]  [place], [place] [address]   SARS Coronavirus 2 by RT PCR (hospital order, performed in [facility] hospital lab) *cepheid single result test* Anterior Nasal Swab     Status: None   Collection Time: 05/21/22 11:03 AM   Specimen: Anterior Nasal Swab  Result Value Ref Range   SARS Coronavirus 2 by RT PCR NEGATIVE NEGATIVE    Comment: (NOTE) SARS-CoV-2 target nucleic acids are NOT DETECTED.  The SARS-CoV-2 RNA is generally detectable in upper and lower respiratory specimens during the acute phase of infection. The lowest concentration of SARS-CoV-2 viral copies this assay can detect is 250 copies / mL. A negative result does not preclude SARS-CoV-2 infection and should not be used as the sole basis for treatment or other patient management decisions.  A negative result  may occur with improper specimen collection / handling, submission of specimen other than nasopharyngeal swab, presence of viral mutation(s) within the areas targeted by this assay, and inadequate number of viral copies (<250 copies / mL). A negative result must be combined with clinical observations, patient history, and epidemiological information.  Fact Sheet for Patients:   RoadLapTop.co.za  Fact Sheet for Healthcare Providers: http://kim-miller.com/  This test is not yet approved or  cleared by the [place] FDA and has been authorized for detection and/or diagnosis of SARS-CoV-2 by FDA under an Emergency Use Authorization (EUA).  This EUA will remain in effect (meaning this test can be used) for the duration of the COVID-19 declaration under Section 564(b)(1) of the Act, 21 U.S.C. section 360bbb-3(b)(1), unless the authorization is terminated or revoked sooner.  Performed at [facility], [address] [place], [place] [address]   Lactic acid, plasma     Status: None   Collection Time: 05/21/22 11:05 AM  Result Value Ref Range   Lactic Acid, Venous 1.8  0.5 - 1.9 mmol/L    Comment: Performed at [facility], [address] [place], [place] [address]  Troponin I (High Sensitivity)     Status: None   Collection Time: 05/21/22  1:23 PM  Result Value Ref Range   Troponin I (High Sensitivity) 14 <18 ng/L    Comment: (NOTE) Elevated high sensitivity troponin I (hsTnI) values and significant  changes across serial measurements may suggest ACS but many other  chronic and acute conditions are known to elevate hsTnI results.  Refer to the "Links" section for chest pain algorithms and additional  guidance. Performed at [facility], [address] [place], [place] [address]    Assessment and Plan:  Atypical Pneumonia -Diagnosed with Cough and Bronchitis at his PCP and given Azithromycin and failed to improve -CTA Chest done and showed "No evidence of pulmonary embolism. Multiple ground-glass opacities in the left-greater-than-right lower lobes, suspicious for atypical infection. Coronary atherosclerosis. Aortic Atherosclerosis. Ankylosing spondylitis." -DG  Chest done and showed "No evidence of airspace consolidation or pulmonary edema. Peribronchial thickening. This finding may be due to viral infection, asthma or other causes chronic airway inflammation. Aortic Atherosclerosis Findings suspicious for ankylosing spondylitis" -Patient on Xopenex and Atrovent -Guaifenesin 1200 mg p.o. twice daily, incentive spirometry and flutter valve -A dose of IV steroids in the ED -Check respiratory virus panel and blood cultures x 2 as well -Chest x-ray in the a.m. 2 view -Continue doxycycline and ceftriaxone -Check urine Legionella and strep pneumo antigen  Diarrhea -Check GI pathogen panel and C. difficile however he states that is resolving now after he took Imodium  Chronic atrial fibrillation -Monitor on telemetry and continue with anticoagulation and beta-blocker and calcium channel blocker with diltiazem -Has a CHA2DS2-VASc  score of 3  Chronic Systolic CHF -Check I's and O's and daily weights -Has shortness of breath but denies any leg swelling or orthopnea. -Repeat chest x-ray in a.m. -BNP is elevated but will not give any further fluid given concern for volume overload -Continue to monitor for signs and symptoms of volume overload  CAD -Continue home regimen as is found to be nonobstructive cardiology notes  Ankylosing Spondylitis -Noted. C/w Home Pain Control with Hydrocodone-Acetaminophen   Advance Care Planning:   Code Status: Prior FULL CODE  Consults: None  Family Communication: No family present at bedside   Severity of Illness: The appropriate patient status for this patient is  OBSERVATION. Observation status is judged to be reasonable and necessary in order to provide the required intensity of service to ensure the patient's safety. The patient's presenting symptoms, physical exam findings, and initial radiographic and laboratory data in the context of their medical condition is felt to place them at decreased risk for further clinical deterioration. Furthermore, it is anticipated that the patient will be medically stable for discharge from the hospital within 2 midnights of admission.   Author: Marguerita Merles, DO 05/21/2022 5:47 PM  For on call review [email or web address].

## 2022-05-21 NOTE — ED Provider Notes (Signed)
[place] [facility] Provider Note   CSN: [id number] Arrival date & time: 05/21/22  1049     History  Chief Complaint  Patient presents with   Cough    [name] Woods is a 82 y.o. male.  Patient with a history of atrial fibrillation on Eliquis, CHF, CAD, hypertension, hyperlipidemia, recently treated for cough and bronchitis by his PCP presenting with cough and shortness of breath as well as diarrhea.  States has been sick for the past 4 to 5 days.  Saw his PCP on Friday and was given a course of Zithromax.  Has 1 dose left.  Feels like his breathing is worse.  Feels more short of breath with productive cough of green and yellow mucus.  Difficulty lying flat, generalized weakness, poor p.o. intake and fatigue.  Normal able to lay flat without difficulty.  Wife reports "sounds like firecrackers" when he tries to breathe and lay flat.  No abdominal pain.  Has had intermittent diarrhea for the past several days as well that started before the Zithromax use.  Estimates 4-5 loose nonbloody stools daily.  No vomiting.  No known fever.  No travel or sick contacts.  No leg pain or leg swelling.  No pain with urination or blood in the urine.  Patient notably did hit his head a week or so ago and did not seek evaluation for this.  Denies headache currently.  The history is provided by the patient and the spouse.  Cough Associated symptoms: shortness of breath   Associated symptoms: no chest pain, no myalgias, no rash and no rhinorrhea        Home Medications Prior to Admission medications   Medication Sig Start Date End Date Taking? Authorizing Provider  allopurinol (ZYLOPRIM) 100 MG tablet TAKE 1 TABLET EVERY DAY 03/05/22   Ardith Dark, MD  apixaban (ELIQUIS) 5 MG TABS tablet Take 1 tablet (5 mg total) by mouth 2 (two) times daily. 04/13/21   Rollene Rotunda, MD  atorvastatin (LIPITOR) 10 MG tablet TAKE 1 TABLET EVERY DAY 03/05/22   Ardith Dark, MD   azelastine (ASTELIN) 0.1 % nasal spray Place 2 sprays into both nostrils 2 (two) times daily. 03/09/21   Ardith Dark, MD  azithromycin (ZITHROMAX) 250 MG tablet take 2 tablets by mouth today, then 1 tablet a day for 4 days. 05/18/22   Ardith Dark, MD  ciclopirox [facility]) 8 % solution Apply topically at bedtime. Apply over nail and surrounding skin. Apply daily over previous coat. After seven (7) days, may remove with alcohol and continue cycle. 07/14/20   Eulis Foster, FNP  cyanocobalamin (VITAMIN B12) 1000 MCG tablet Take 1 tablet (1,000 mcg total) by mouth daily. 09/14/21   Ardith Dark, MD  cyclobenzaprine (FLEXERIL) 10 MG tablet Take 1 tablet (10 mg total) by mouth 3 (three) times daily as needed for muscle spasms. 11/23/21   Ardith Dark, MD  diltiazem [facility]) 240 MG 24 hr capsule TAKE 1 CAPSULE EVERY DAY 06/27/21   Rollene Rotunda, MD  furosemide (LASIX) 20 MG tablet TAKE 1 TABLET EVERY DAY 06/27/21   Rollene Rotunda, MD  HYDROcodone-acetaminophen (NORCO) 10-325 MG tablet Take 1 tablet by mouth every 6 (six) hours as needed. 05/17/22   Ardith Dark, MD  hydrOXYzine (VISTARIL) 25 MG capsule Take 1 capsule (25 mg total) by mouth every 8 (eight) hours as needed for itching. 08/17/21   Ardith Dark, MD  metoprolol  succinate (TOPROL-XL) 100 MG 24 hr tablet TAKE 1 TABLET  TWO TIMES DAILY  WITH OR IMMEDIATELY FOLLOWING A MEAL. 05/10/21   Rollene Rotunda, MD  tamsulosin (FLOMAX) 0.4 MG CAPS capsule Take 1 capsule (0.4 mg total) by mouth daily. 01/15/22   Ardith Dark, MD  pantoprazole (PROTONIX) 40 MG tablet Take 1 tablet (40 mg total) by mouth daily. 03/09/20 04/15/20  Kathlen Mody, MD      Allergies    Coumadin [warfarin]    Review of Systems   Review of Systems  Constitutional:  Positive for activity change, appetite change and fatigue.  HENT:  Negative for congestion and rhinorrhea.   Respiratory:  Positive for cough, chest tightness and shortness of breath.    Cardiovascular:  Negative for chest pain.  Gastrointestinal:  Positive for diarrhea. Negative for nausea and vomiting.  Genitourinary:  Negative for dysuria and hematuria.  Musculoskeletal:  Negative for arthralgias and myalgias.  Skin:  Negative for rash.  Neurological:  Positive for weakness. Negative for dizziness and light-headedness.   all other systems are negative except as noted in the HPI and PMH.    Physical Exam Updated Vital Signs BP (!) 174/81 (BP Location: Right Arm)   Pulse (!) 114   Temp 98 F (36.7 C) (Oral)   Resp 18   Ht  (1.803 m)   Wt 89.6 kg   SpO2 100%   BMI 27.56 kg/m  Physical Exam Vitals and nursing note reviewed.  Constitutional:      General: He is not in acute distress.    Appearance: He is well-developed.     Comments: No Increased work of breathing  HENT:     Head: Normocephalic and atraumatic.     Mouth/Throat:     Pharynx: No oropharyngeal exudate.  Eyes:     Conjunctiva/sclera: Conjunctivae normal.     Pupils: Pupils are equal, round, and reactive to light.  Neck:     Comments: No meningismus. Cardiovascular:     Rate and Rhythm: Normal rate. Rhythm irregular.     Heart sounds: Normal heart sounds. No murmur heard. Pulmonary:     Effort: Pulmonary effort is normal. No respiratory distress.     Breath sounds: Rhonchi present.     Comments: Coarse breath sounds bilaterally Abdominal:     Palpations: Abdomen is soft.     Tenderness: There is no abdominal tenderness. There is no guarding or rebound.  Musculoskeletal:        General: No tenderness. Normal range of motion.     Cervical back: Normal range of motion and neck supple.     Right lower leg: No edema.     Left lower leg: No edema.  Skin:    General: Skin is warm.  Neurological:     General: No focal deficit present.     Mental Status: He is alert and oriented to person, place, and time. Mental status is at baseline.     Cranial Nerves: No cranial nerve deficit.      Motor: No abnormal muscle tone.     Coordination: Coordination normal.     Comments:  5/5 strength throughout. CN 2-12 intact.Equal grip strength.   Psychiatric:        Behavior: Behavior normal.     ED Results / Procedures / Treatments   Labs (all labs ordered are listed, but only abnormal results are displayed) Labs Reviewed  COMPREHENSIVE METABOLIC PANEL - Abnormal; Notable for the following components:  Result Value   Calcium 8.8 (*)    Total Bilirubin 2.0 (*)    All other components within normal limits  BRAIN NATRIURETIC PEPTIDE - Abnormal; Notable for the following components:   B Natriuretic Peptide 392.0 (*)    All other components within normal limits  PROTIME-INR - Abnormal; Notable for the following components:   Prothrombin Time 15.3 (*)    All other components within normal limits  SARS CORONAVIRUS 2 BY RT PCR  CBC WITH DIFFERENTIAL/PLATELET  LACTIC ACID, PLASMA  TROPONIN I (HIGH SENSITIVITY)  TROPONIN I (HIGH SENSITIVITY)    EKG EKG Interpretation  Date/Time:  Monday May 21 2022 10:59:01 EDT Ventricular Rate:  65 PR Interval:    QRS Duration: 149 QT Interval:  434 QTC Calculation: 452 R Axis:   97 Text Interpretation: Atrial fibrillation Nonspecific intraventricular conduction delay Probable anteroseptal infarct, recent No significant change was found Confirmed by Glynn Octave [phone]) on 05/21/2022 11:05:54 AM  Radiology CT Angio Chest PE W and/or Wo Contrast  Result Date: 05/21/2022 CLINICAL DATA:  Pulmonary embolism (PE) suspected, low to intermediate prob, positive D-dimer. Productive cough. EXAM: CT ANGIOGRAPHY CHEST WITH CONTRAST TECHNIQUE: Multidetector CT imaging of the chest was performed using the standard protocol during bolus administration of intravenous contrast. Multiplanar CT image reconstructions and MIPs were obtained to evaluate the vascular anatomy. RADIATION DOSE REDUCTION: This exam was performed according to the departmental  dose-optimization program which includes automated exposure control, adjustment of the mA and/or kV according to patient size and/or use of iterative reconstruction technique. CONTRAST:  OMNIPAQUE IOHEXOL 350 MG/ML SOLN COMPARISON:  Chest CT 09/07/2015. FINDINGS: Cardiovascular: Satisfactory opacification of the pulmonary arteries to the segmental level. No evidence of pulmonary embolism. Extensive atherosclerotic calcifications of the coronary arteries and thoracic aorta. No pericardial effusion. Mediastinum/Nodes: No enlarged mediastinal, hilar, or axillary lymph nodes. Thyroid gland, trachea, and esophagus demonstrate no significant findings. Lungs/Pleura: Multiple ground-glass opacities in the left-greater-than-right lower lobes, suspicious for atypical infection. No pleural effusion or pneumothorax. Upper Abdomen: No acute abnormality. Musculoskeletal: Confluent anterior syndesmophytes along the thoracic spine with associated fusion of the posterior elements and spinous processes, compatible with known history of ankylosing spondylitis. Review of the MIP images confirms the above findings. IMPRESSION: 1. No evidence of pulmonary embolism. 2. Multiple ground-glass opacities in the left-greater-than-right lower lobes, suspicious for atypical infection. 3. Coronary atherosclerosis. 4. Aortic Atherosclerosis (ICD10-I70.0). 5. Ankylosing spondylitis. Electronically Signed   By: Orvan Falconer M.D.   On: 05/21/2022 13:33   CT Head Wo Contrast  Result Date: 05/21/2022 CLINICAL DATA:  Head trauma last week, on Eliquis EXAM: CT HEAD WITHOUT CONTRAST TECHNIQUE: Contiguous axial images were obtained from the base of the skull through the vertex without intravenous contrast. RADIATION DOSE REDUCTION: This exam was performed according to the departmental dose-optimization program which includes automated exposure control, adjustment of the mA and/or kV according to patient size and/or use of iterative  reconstruction technique. COMPARISON:  None Available. FINDINGS: Brain: No evidence of acute infarction, hemorrhage, mass, mass effect, or midline shift. No hydrocephalus or extra-axial fluid collection. Left basal ganglia calcifications. Periventricular white matter changes, likely the sequela of chronic small vessel ischemic disease. Vascular: No hyperdense vessel. Skull: Negative for fracture or focal lesion. Sinuses/Orbits: Mild mucosal thickening throughout the paranasal sinuses, most prominently in the ethmoid air cells. No acute finding in the orbits. Other: The mastoid air cells are well aerated. IMPRESSION: No acute intracranial process. Electronically Signed   By: Elaina Pattee.D.  On: 05/21/2022 11:29   DG Chest 2 View  Result Date: 05/21/2022 CLINICAL DATA:  Provided history: Shortness of breath. Productive cough. EXAM: CHEST - 2 VIEW COMPARISON:  Chest radiographs 03/06/2020 and earlier. Chest CT 09/07/2015. FINDINGS: Heart size at the upper limits of normal. Aortic atherosclerosis. Peribronchial thickening. No appreciable airspace consolidation or pulmonary edema. No evidence of pleural effusion or pneumothorax. Multilevel syndesmophytes within the thoracic spine suspicious for sequelae ankylosing spondylitis. IMPRESSION: 1. No evidence of airspace consolidation or pulmonary edema. 2. Peribronchial thickening. This finding may be due to viral infection, asthma or other causes chronic airway inflammation. 3.  Aortic Atherosclerosis (ICD10-I70.0). 4. Findings suspicious for ankylosing spondylitis. Electronically Signed   By: Jackey Loge D.O.   On: 05/21/2022 11:29    Procedures Procedures    Medications Ordered in ED Medications - No data to display  ED Course/ Medical Decision Making/ A&P                             Medical Decision Making Amount and/or Complexity of Data Reviewed Labs: ordered. Decision-making details documented in ED Course. Radiology: ordered and independent  interpretation performed. Decision-making details documented in ED Course. ECG/medicine tests: ordered and independent interpretation performed. Decision-making details documented in ED Course.  Risk Prescription drug management. Decision regarding hospitalization.   Cough, shortness of breath, generalized weakness, fatigue worsening over the past several days.  Treated for bronchitis by PCP with Zithromax.  On arrival no increased work of breathing and no hypoxia.  Coarse breath sounds bilaterally.  Bronchodilators and steroids.  EKG shows rate controlled atrial fibrillation without acute ST elevation.  X-ray shows coarse bronchial thickening but no evidence of pneumonia or CHF.  Results are reviewed interpreted by me. CT head obtained given head trauma several weeks ago with anticoagulation use.  Negative for hemorrhage or acute finding.  BNP slightly elevated at 392.  Patient did have echocardiogram in 2018 with normal ejection fraction.  Does not appear to be acutely volume overloaded.  Patient able to ambulate without desaturation.  Denies feeling short of breath but did appear dyspneic.  Wife concerned about going home.  Troponin remains flat.  CT obtained negative for pulmonary embolism but does show groundglass opacities bilaterally.  Favor atypical infection versus edema.  Admission versus discharge discussed with patient and wife at bedside.  No evidence of ACS.  Family would prefer admission as they are concerned about his breathing and ability to stay hydrated at home.  IV antibiotics to be given.  Admission discussed with Dr. Alvino Chapel.         Final Clinical Impression(s) / ED Diagnoses Final diagnoses:  Community acquired pneumonia, unspecified laterality    Rx / DC Orders ED Discharge Orders     None         Yanis Larin, Jeannett Senior, MD 05/21/22 1442

## 2022-05-21 NOTE — ED Triage Notes (Signed)
Pt states productive cough that started last week  Was seen at pcp and started on azithromycin Friday (4 days) Has continued to get worse, states started having diarrhea that started 5 days ago  States unable to lay flay and "sounds like fire crackers when he breathes"    H/o CHF

## 2022-05-22 ENCOUNTER — Observation Stay (HOSPITAL_COMMUNITY): Payer: Medicare HMO

## 2022-05-22 ENCOUNTER — Other Ambulatory Visit (HOSPITAL_COMMUNITY): Payer: Self-pay

## 2022-05-22 DIAGNOSIS — I482 Chronic atrial fibrillation, unspecified: Secondary | ICD-10-CM | POA: Diagnosis not present

## 2022-05-22 DIAGNOSIS — J123 Human metapneumovirus pneumonia: Secondary | ICD-10-CM | POA: Diagnosis not present

## 2022-05-22 DIAGNOSIS — J208 Acute bronchitis due to other specified organisms: Secondary | ICD-10-CM

## 2022-05-22 DIAGNOSIS — R0602 Shortness of breath: Secondary | ICD-10-CM | POA: Diagnosis not present

## 2022-05-22 DIAGNOSIS — J189 Pneumonia, unspecified organism: Secondary | ICD-10-CM | POA: Diagnosis not present

## 2022-05-22 DIAGNOSIS — B9781 Human metapneumovirus as the cause of diseases classified elsewhere: Secondary | ICD-10-CM | POA: Diagnosis not present

## 2022-05-22 LAB — CBC WITH DIFFERENTIAL/PLATELET
Abs Immature Granulocytes: 0.04 10*3/uL (ref 0.00–0.07)
Basophils Absolute: 0 10*3/uL (ref 0.0–0.1)
Basophils Relative: 0 %
Eosinophils Absolute: 0 10*3/uL (ref 0.0–0.5)
Eosinophils Relative: 0 %
HCT: 48.4 % (ref 39.0–52.0)
Hemoglobin: 15.5 g/dL (ref 13.0–17.0)
Immature Granulocytes: 1 %
Lymphocytes Relative: 16 %
Lymphs Abs: 1.4 10*3/uL (ref 0.7–4.0)
MCH: 30.1 pg (ref 26.0–34.0)
MCHC: 32 g/dL (ref 30.0–36.0)
MCV: 94 fL (ref 80.0–100.0)
Monocytes Absolute: 0.3 10*3/uL (ref 0.1–1.0)
Monocytes Relative: 3 %
Neutro Abs: 6.8 10*3/uL (ref 1.7–7.7)
Neutrophils Relative %: 80 %
Platelets: 178 10*3/uL (ref 150–400)
RBC: 5.15 MIL/uL (ref 4.22–5.81)
RDW: 13.9 % (ref 11.5–15.5)
WBC: 8.4 10*3/uL (ref 4.0–10.5)
nRBC: 0 % (ref 0.0–0.2)

## 2022-05-22 LAB — COMPREHENSIVE METABOLIC PANEL
ALT: 14 U/L (ref 0–44)
AST: 27 U/L (ref 15–41)
Albumin: 3.4 g/dL — ABNORMAL LOW (ref 3.5–5.0)
Alkaline Phosphatase: 73 U/L (ref 38–126)
Anion gap: 17 — ABNORMAL HIGH (ref 5–15)
BUN: 20 mg/dL (ref 8–23)
CO2: 19 mmol/L — ABNORMAL LOW (ref 22–32)
Calcium: 8.9 mg/dL (ref 8.9–10.3)
Chloride: 101 mmol/L (ref 98–111)
Creatinine, Ser: 1.14 mg/dL (ref 0.61–1.24)
GFR, Estimated: >60 mL/min (ref >60)
Glucose, Bld: 238 mg/dL — ABNORMAL HIGH (ref 70–99)
Potassium: 3.6 mmol/L (ref 3.5–5.1)
Sodium: 137 mmol/L (ref 135–145)
Total Bilirubin: 1.6 mg/dL — ABNORMAL HIGH (ref 0.3–1.2)
Total Protein: 6.5 g/dL (ref 6.5–8.1)

## 2022-05-22 LAB — RESPIRATORY PANEL BY PCR

## 2022-05-22 LAB — MAGNESIUM: Magnesium: 2 mg/dL (ref 1.7–2.4)

## 2022-05-22 LAB — PHOSPHORUS: Phosphorus: 1.9 mg/dL — ABNORMAL LOW (ref 2.5–4.6)

## 2022-05-22 LAB — CULTURE, BLOOD (ROUTINE X 2): Culture: NO GROWTH

## 2022-05-22 LAB — GLUCOSE, CAPILLARY: Glucose-Capillary: 143 mg/dL — ABNORMAL HIGH (ref 70–99)

## 2022-05-22 LAB — STREP PNEUMONIAE URINARY ANTIGEN: Strep Pneumo Urinary Antigen: NEGATIVE

## 2022-05-22 LAB — TSH: TSH: 1.119 u[IU]/mL (ref 0.350–4.500)

## 2022-05-22 MED ORDER — GUAIFENESIN ER 600 MG PO TB12
600.0000 mg | ORAL_TABLET | Freq: Two times a day (BID) | ORAL | 0 refills | Status: AC
  Filled 2022-05-22: qty 10, 5d supply, fill #0

## 2022-05-22 MED ORDER — METHYLPREDNISOLONE SODIUM SUCC 125 MG IJ SOLR
60.0000 mg | Freq: Two times a day (BID) | INTRAMUSCULAR | Status: DC
  Administered 2022-05-22: 60 mg via INTRAVENOUS
  Filled 2022-05-22: qty 2

## 2022-05-22 MED ORDER — ONDANSETRON HCL 4 MG PO TABS
4.0000 mg | ORAL_TABLET | Freq: Four times a day (QID) | ORAL | 0 refills | Status: DC | PRN
  Filled 2022-05-22: qty 20, 5d supply, fill #0

## 2022-05-22 MED ORDER — IPRATROPIUM BROMIDE 0.02 % IN SOLN
0.5000 mg | Freq: Two times a day (BID) | RESPIRATORY_TRACT | Status: DC
  Administered 2022-05-22: 0.5 mg via RESPIRATORY_TRACT
  Filled 2022-05-22: qty 2.5

## 2022-05-22 MED ORDER — PREDNISONE 10 MG (21) PO TBPK
ORAL_TABLET | ORAL | 0 refills | Status: DC
  Filled 2022-05-22: qty 21, 6d supply, fill #0

## 2022-05-22 MED ORDER — LEVALBUTEROL HCL 0.63 MG/3ML IN NEBU
0.6300 mg | INHALATION_SOLUTION | Freq: Two times a day (BID) | RESPIRATORY_TRACT | Status: DC
  Administered 2022-05-22: 0.63 mg via RESPIRATORY_TRACT
  Filled 2022-05-22: qty 3

## 2022-05-22 MED ORDER — DOXYCYCLINE HYCLATE 100 MG PO TABS
100.0000 mg | ORAL_TABLET | Freq: Two times a day (BID) | ORAL | 0 refills | Status: AC
  Filled 2022-05-22: qty 8, 4d supply, fill #0

## 2022-05-22 MED ORDER — K PHOS MONO-SOD PHOS DI & MONO 155-852-130 MG PO TABS
500.0000 mg | ORAL_TABLET | Freq: Two times a day (BID) | ORAL | Status: DC
  Administered 2022-05-22: 500 mg via ORAL
  Filled 2022-05-22: qty 2

## 2022-05-22 MED ORDER — ACETAMINOPHEN 325 MG PO TABS
650.0000 mg | ORAL_TABLET | Freq: Four times a day (QID) | ORAL | 0 refills | Status: AC | PRN
  Filled 2022-05-22: qty 20, 3d supply, fill #0

## 2022-05-22 NOTE — Progress Notes (Signed)
AVS and discharge instructions reviewed with patient and wife at the bedside. Both verbalized understanding and had no further questions.

## 2022-05-22 NOTE — Plan of Care (Signed)

## 2022-05-22 NOTE — TOC CM/SW Note (Signed)
  Transition of Care [facility]) Screening Note   Patient Details  Name: [name] Woods Date of Birth: 1940/10/05   Transition of Care [facility]) CM/SW Contact:    Howell Rucks, RN Phone Number: 05/22/2022, 2:44 PM    Transition of Care Department [facility]) has reviewed patient and no TOC needs have been identified at this time. We will continue to monitor patient advancement through interdisciplinary progression rounds. If new patient transition needs arise, please place a TOC consult.

## 2022-05-22 NOTE — Care Management Obs Status (Signed)
MEDICARE OBSERVATION STATUS NOTIFICATION   Patient Details  Name: [name] Woods MRN: [id number] Date of Birth: [birth year]   Medicare Observation Status Notification Given:       Howell Rucks, RN 05/22/2022, 2:36 PM

## 2022-05-22 NOTE — Discharge Summary (Signed)
Physician Discharge Summary   Patient: [name] Woods MRN: [id number] DOB: 1940-02-28  Admit date:     05/21/2022  Discharge date: 05/22/22  Discharge Physician: Marguerita Merles, DO   PCP: Ardith Dark, MD   Recommendations at discharge:   Follow-up with PCP within 1 to 2 weeks for repeat CBC, CMP, mag, Phos within 1 week Repeat chest x-ray in 3 to 6 weeks  Discharge Diagnoses: Active Problems:   Shortness of breath  Resolved Problems:   * No resolved hospital problems. *  Hospital Course: [name] Woods is a 82 y.o. male with medical history significant for but not limited to anxiety depression, arthritis, history of CAD with a reduced EF, history of dysrhythmia and atrial fibrillation, GERD, hypertension, hyperlipidemia as well as other comorbidities who presents to the hospital with worsening shortness of breath and cough.  Patient also has some diarrhea noted.  Patient states that Monday night he started developing a cough and progressively gotten worse.  States he became more productive and states it was clear and then became thick but never changed color.  Cough persisted and he saw his PCP on Friday and was prescribed azithromycin but did not improve.  States that he started developing diarrhea on Wednesday prior to getting antibiotics and has been having loose stools but they are not watery.  Patient states that he took Imodium for and loose stools have now stopped.  He continues have a persistent cough but states that he is feeling better.  He got extremely short of breath and because he failed to improve with antibiotics after seeing his PCP presented to the ED for further evaluation at [facility].  He was treated for his cough and rhonchi but given his failure of improvement and feeling of worse breathing and difficulty laying flat and generalized weakness he is transferred to [facility] for further admission.  Patient felt that there was "firecrackers" or  "pops" his  chest when he tried to breathe.  Denies abdominal pain.  Denies any leg swelling, or any exposure to sick contacts.  Further workup was done and he was found to have an atypical pneumonia.  Tried hospitalist was asked to admit this patient for atypical pneumonia failed outpatient treatment.   **Interim History  Patient underwent further workup showed that he had metapneumovirus.  He improved and ambulated and did not desaturate.  He was given steroids and we will continue him on doxycycline just in case to cover for superimposed bacterial atypical infection.  He is medically stable for discharge at this time and will follow-up with his PCP and repeat chest x-ray in the outpatient setting.  Assessment and Plan:  Atypical Pneumonia in the setting of metapneumovirus -Diagnosed with Cough and Bronchitis at his PCP and given Azithromycin and failed to improve -CTA Chest done and showed "No evidence of pulmonary embolism. Multiple ground-glass opacities in the left-greater-than-right lower lobes, suspicious for atypical infection. Coronary atherosclerosis. Aortic Atherosclerosis. Ankylosing spondylitis." -DG  Chest done and showed "No evidence of airspace consolidation or pulmonary edema. Peribronchial thickening. This finding may be due to viral infection, asthma or other causes chronic airway inflammation. Aortic Atherosclerosis Findings suspicious for ankylosing spondylitis" -Patient on Xopenex and Atrovent -Guaifenesin 1200 mg p.o. twice daily, incentive spirometry and flutter valve -A dose of IV steroids in the ED; will continue IV steroids and placed him on IV Solu-Medrol and will change to p.o. prednisone taper -Check respiratory virus panel and blood cultures x 2 as well -  Chest x-ray in outpatient setting -Continued doxycycline and ceftriaxone they will stop the ceftriaxone and discontinue IV ceftriaxone and will continue the doxycycline for total of 5 days just to cover for superimposed bacterial  atypical infection -Check urine Legionella and still pending and strep pneumo antigen with negative -Patient is stable for discharge and will need to follow-up with PCP and repeat chest x-ray in 3 to 6 weeks to continue steroids   Diarrhea, improved -Check GI pathogen panel and C. difficile however he states that is resolving now after he took Imodium and has not had a bowel movement so we will cancel   Chronic Atrial Fibrillation -Monitor on telemetry and continue with anticoagulation and beta-blocker and calcium channel blocker with diltiazem -Has a CHA2DS2-VASc score of 3   Chronic Systolic CHF -Check I's and O's and daily weights -Has shortness of breath but denies any leg swelling or orthopnea. -Repeat chest x-ray this a.m. showed no active disease -BNP is elevated but will not give any further fluid given concern for volume overload -Continue to monitor for signs and symptoms of volume overload   CAD -Continue Home regimen as is found to be nonobstructive cardiology notes   Ankylosing Spondylitis -Noted. C/w Home Pain Control with Hydrocodone-Acetaminophen   Metabolic Acidosis -Mild.  Has a CO2 of 19, anion gap of 17, chloride level of 101 but no symptoms and lactic acid level is 1.8 -Continue to Monitor and trend repeat CMP within 1 week  Hyperbilirubinemia -Mild and likely reactive and trending down Recent Labs  Lab 05/21/22 1103 05/22/22 0457  BILITOT 2.0* 1.6*  -Continue to Monitor and Trend and Repeat CBC in the AM    Consultants: None Procedures performed: As delineated as above  Disposition: Home Diet recommendation:  Discharge Diet Orders (From admission, onward)     Start     Ordered   05/22/22 0000  Diet - low sodium heart healthy        05/22/22 1405           Cardiac diet DISCHARGE MEDICATION: Allergies as of 05/22/2022       Reactions   Warfarin Other (See Comments)   GI bleeds   Nsaids Other (See Comments)   Only Tylenol is tolerated- is  not supposed to take this because he's on Eliquis        Medication List     STOP taking these medications    Azelastine HCl 137 MCG/SPRAY Soln   azithromycin 250 MG tablet Commonly known as: ZITHROMAX   ciclopirox 8 % solution Commonly known as: Penlac   hydrOXYzine 25 MG capsule Commonly known as: VISTARIL       TAKE these medications    acetaminophen 325 MG tablet Commonly known as: TYLENOL Take 2 tablets (650 mg total) by mouth every 6 (six) hours as needed for mild pain (or Fever >/= 101).   allopurinol 100 MG tablet Commonly known as: ZYLOPRIM TAKE 1 TABLET EVERY DAY   atorvastatin 10 MG tablet Commonly known as: LIPITOR TAKE 1 TABLET EVERY DAY   cyanocobalamin 1000 MCG tablet Commonly known as: VITAMIN B12 Take 1 tablet (1,000 mcg total) by mouth daily.   cyclobenzaprine 10 MG tablet Commonly known as: FLEXERIL Take 1 tablet (10 mg total) by mouth 3 (three) times daily as needed for muscle spasms.   diltiazem 240 MG 24 hr capsule Commonly known as: CARDIZEM CD TAKE 1 CAPSULE EVERY DAY   doxycycline 100 MG tablet Commonly known as: VIBRA-TABS Take 1 tablet (  100 mg total) by mouth every 12 (twelve) hours for 4 days.   Eliquis 5 MG Tabs tablet Generic drug: apixaban Take 1 tablet (5 mg total) by mouth 2 (two) times daily.   furosemide 20 MG tablet Commonly known as: LASIX TAKE 1 TABLET EVERY DAY What changed: when to take this   guaiFENesin 600 MG 12 hr tablet Commonly known as: MUCINEX Take 1 tablet (600 mg total) by mouth 2 (two) times daily for 5 days.   HYDROcodone-acetaminophen 10-325 MG tablet Commonly known as: NORCO Take 1 tablet by mouth every 6 (six) hours as needed. What changed:  when to take this reasons to take this   metoprolol succinate 100 MG 24 hr tablet Commonly known as: TOPROL-XL TAKE 1 TABLET  TWO TIMES DAILY  WITH OR IMMEDIATELY FOLLOWING A MEAL. What changed: See the new instructions.   ondansetron 4 MG  tablet Commonly known as: ZOFRAN Take 1 tablet (4 mg total) by mouth every 6 (six) hours as needed for nausea.   predniSONE 10 MG (21) Tbpk tablet Commonly known as: STERAPRED UNI-PAK 21 TAB Take 6 tablets on day 1, 5 tablets on day 2, 4 tablets on day 3, 3 tablets on day 4, 2 tablets on day 5, 1 tablet on day 6 and then stop   tamsulosin 0.4 MG Caps capsule Commonly known as: FLOMAX Take 1 capsule (0.4 mg total) by mouth daily.       Discharge Exam: Filed Weights   05/21/22 1055 05/22/22 0500  Weight: 89.6 kg 89.4 kg   Vitals:   05/22/22 0804 05/22/22 1249  BP:  (!) 147/53  Pulse:  (!) 57  Resp:  16  Temp:  97.6 F (36.4 C)  SpO2: 97% 99%   Examination: Physical Exam:  Constitutional: WN/WD overweight Caucasian male in no acute distress appears calm and back to his baseline Respiratory: Diminished to auscultation bilaterally, no wheezing, rales, rhonchi or crackles. Normal respiratory effort and patient is not tachypenic. No accessory muscle use.  Unlabored breathing Cardiovascular: RRR, no murmurs / rubs / gallops. S1 and S2 auscultated. No extremity edema. Abdomen: Soft, non-tender, distended secondary to body habitus. bowel sounds positive.  GU: Deferred. Musculoskeletal: No clubbing / cyanosis of digits/nails. No joint deformity upper and lower extremities.  Skin: No rashes, lesions, ulcers on limited skin evaluation. No induration; Warm and dry.  Neurologic: CN 2-12 grossly intact with no focal deficits.  Romberg sign and cerebellar reflexes not assessed.  Psychiatric: Normal judgment and insight. Alert and oriented x 3. Normal mood and appropriate affect.   Condition at discharge: stable  The results of significant diagnostics from this hospitalization (including imaging, microbiology, ancillary and laboratory) are listed below for reference.   Imaging Studies: DG CHEST PORT 1 VIEW  Result Date: 05/22/2022 CLINICAL DATA:  Shortness of breath. EXAM: PORTABLE  CHEST 1 VIEW COMPARISON:  05/21/2022 FINDINGS: Both lungs are clear. Negative for a pneumothorax. Heart size is normal and stable. Atherosclerotic calcifications at the aortic arch. Trachea is midline. No acute bone abnormality. IMPRESSION: No active disease. Electronically Signed   By: Richarda Overlie M.D.   On: 05/22/2022 09:22   CT Angio Chest PE W and/or Wo Contrast  Result Date: 05/21/2022 CLINICAL DATA:  Pulmonary embolism (PE) suspected, low to intermediate prob, positive D-dimer. Productive cough. EXAM: CT ANGIOGRAPHY CHEST WITH CONTRAST TECHNIQUE: Multidetector CT imaging of the chest was performed using the standard protocol during bolus administration of intravenous contrast. Multiplanar CT image reconstructions and MIPs were  obtained to evaluate the vascular anatomy. RADIATION DOSE REDUCTION: This exam was performed according to the departmental dose-optimization program which includes automated exposure control, adjustment of the mA and/or kV according to patient size and/or use of iterative reconstruction technique. CONTRAST:  OMNIPAQUE IOHEXOL 350 MG/ML SOLN COMPARISON:  Chest CT 09/07/2015. FINDINGS: Cardiovascular: Satisfactory opacification of the pulmonary arteries to the segmental level. No evidence of pulmonary embolism. Extensive atherosclerotic calcifications of the coronary arteries and thoracic aorta. No pericardial effusion. Mediastinum/Nodes: No enlarged mediastinal, hilar, or axillary lymph nodes. Thyroid gland, trachea, and esophagus demonstrate no significant findings. Lungs/Pleura: Multiple ground-glass opacities in the left-greater-than-right lower lobes, suspicious for atypical infection. No pleural effusion or pneumothorax. Upper Abdomen: No acute abnormality. Musculoskeletal: Confluent anterior syndesmophytes along the thoracic spine with associated fusion of the posterior elements and spinous processes, compatible with known history of ankylosing spondylitis. Review of the  MIP images confirms the above findings. IMPRESSION: 1. No evidence of pulmonary embolism. 2. Multiple ground-glass opacities in the left-greater-than-right lower lobes, suspicious for atypical infection. 3. Coronary atherosclerosis. 4. Aortic Atherosclerosis (ICD10-I70.0). 5. Ankylosing spondylitis. Electronically Signed   By: Orvan Falconer M.D.   On: 05/21/2022 13:33   CT Head Wo Contrast  Result Date: 05/21/2022 CLINICAL DATA:  Head trauma last week, on Eliquis EXAM: CT HEAD WITHOUT CONTRAST TECHNIQUE: Contiguous axial images were obtained from the base of the skull through the vertex without intravenous contrast. RADIATION DOSE REDUCTION: This exam was performed according to the departmental dose-optimization program which includes automated exposure control, adjustment of the mA and/or kV according to patient size and/or use of iterative reconstruction technique. COMPARISON:  None Available. FINDINGS: Brain: No evidence of acute infarction, hemorrhage, mass, mass effect, or midline shift. No hydrocephalus or extra-axial fluid collection. Left basal ganglia calcifications. Periventricular white matter changes, likely the sequela of chronic small vessel ischemic disease. Vascular: No hyperdense vessel. Skull: Negative for fracture or focal lesion. Sinuses/Orbits: Mild mucosal thickening throughout the paranasal sinuses, most prominently in the ethmoid air cells. No acute finding in the orbits. Other: The mastoid air cells are well aerated. IMPRESSION: No acute intracranial process. Electronically Signed   By: Wiliam Ke M.D.   On: 05/21/2022 11:29   DG Chest 2 View  Result Date: 05/21/2022 CLINICAL DATA:  Provided history: Shortness of breath. Productive cough. EXAM: CHEST - 2 VIEW COMPARISON:  Chest radiographs 03/06/2020 and earlier. Chest CT 09/07/2015. FINDINGS: Heart size at the upper limits of normal. Aortic atherosclerosis. Peribronchial thickening. No appreciable airspace consolidation or  pulmonary edema. No evidence of pleural effusion or pneumothorax. Multilevel syndesmophytes within the thoracic spine suspicious for sequelae ankylosing spondylitis. IMPRESSION: 1. No evidence of airspace consolidation or pulmonary edema. 2. Peribronchial thickening. This finding may be due to viral infection, asthma or other causes chronic airway inflammation. 3.  Aortic Atherosclerosis (ICD10-I70.0). 4. Findings suspicious for ankylosing spondylitis. Electronically Signed   By: Jackey Loge D.O.   On: 05/21/2022 11:29    Microbiology: Results for orders placed or performed during the hospital encounter of 05/21/22  SARS Coronavirus 2 by RT PCR (hospital order, performed in [facility] hospital lab) *cepheid single result test* Anterior Nasal Swab     Status: None   Collection Time: 05/21/22 11:03 AM   Specimen: Anterior Nasal Swab  Result Value Ref Range Status   SARS Coronavirus 2 by RT PCR NEGATIVE NEGATIVE Final    Comment: (NOTE) SARS-CoV-2 target nucleic acids are NOT DETECTED.  The SARS-CoV-2 RNA is generally detectable in  upper and lower respiratory specimens during the acute phase of infection. The lowest concentration of SARS-CoV-2 viral copies this assay can detect is 250 copies / mL. A negative result does not preclude SARS-CoV-2 infection and should not be used as the sole basis for treatment or other patient management decisions.  A negative result may occur with improper specimen collection / handling, submission of specimen other than nasopharyngeal swab, presence of viral mutation(s) within the areas targeted by this assay, and inadequate number of viral copies (<250 copies / mL). A negative result must be combined with clinical observations, patient history, and epidemiological information.  Fact Sheet for Patients:   RoadLapTop.co.za  Fact Sheet for Healthcare Providers: http://kim-miller.com/  This test is not yet approved  or  cleared by the [place] FDA and has been authorized for detection and/or diagnosis of SARS-CoV-2 by FDA under an Emergency Use Authorization (EUA).  This EUA will remain in effect (meaning this test can be used) for the duration of the COVID-19 declaration under Section 564(b)(1) of the Act, 21 U.S.C. section 360bbb-3(b)(1), unless the authorization is terminated or revoked sooner.  Performed at [facility], [address] [place], [place] [address]   Respiratory (~20 pathogens) panel by PCR     Status: Abnormal   Collection Time: 05/21/22  6:21 PM   Specimen: Nasopharyngeal Swab; Respiratory  Result Value Ref Range Status   Adenovirus NOT DETECTED NOT DETECTED Final   Coronavirus 229E NOT DETECTED NOT DETECTED Final    Comment: (NOTE) The Coronavirus on the Respiratory Panel, DOES NOT test for the novel  Coronavirus (2019 nCoV)    Coronavirus HKU1 NOT DETECTED NOT DETECTED Final   Coronavirus NL63 NOT DETECTED NOT DETECTED Final   Coronavirus OC43 NOT DETECTED NOT DETECTED Final   Metapneumovirus DETECTED (A) NOT DETECTED Final   Rhinovirus / Enterovirus NOT DETECTED NOT DETECTED Final   Influenza A NOT DETECTED NOT DETECTED Final   Influenza B NOT DETECTED NOT DETECTED Final   Parainfluenza Virus 1 NOT DETECTED NOT DETECTED Final   Parainfluenza Virus 2 NOT DETECTED NOT DETECTED Final   Parainfluenza Virus 3 NOT DETECTED NOT DETECTED Final   Parainfluenza Virus 4 NOT DETECTED NOT DETECTED Final   Respiratory Syncytial Virus NOT DETECTED NOT DETECTED Final   Bordetella pertussis NOT DETECTED NOT DETECTED Final   Bordetella Parapertussis NOT DETECTED NOT DETECTED Final   Chlamydophila pneumoniae NOT DETECTED NOT DETECTED Final   Mycoplasma pneumoniae NOT DETECTED NOT DETECTED Final    Comment: Performed at [facility], [address], [place], [place] [address]  Culture, blood (routine x 2) Call MD if unable to obtain prior to antibiotics being given      Status: None (Preliminary result)   Collection Time: 05/21/22  7:12 PM   Specimen: BLOOD LEFT ARM  Result Value Ref Range Status   Specimen Description   Final    BLOOD LEFT ARM Performed at [facility], [address], [place], [place] [address]    Special Requests   Final    BOTTLES DRAWN AEROBIC ONLY Blood Culture adequate volume Performed at [facility], [address] [place] [place], [place] [address]    Culture   Final    NO GROWTH < 12 HOURS Performed at [facility], [address], [place], [place] [address]    Report Status PENDING  Incomplete  Culture, blood (routine x 2) Call MD if unable to obtain prior to  antibiotics being given     Status: None (Preliminary result)   Collection Time: 05/21/22  7:47 PM   Specimen: BLOOD  Result Value Ref Range Status   Specimen Description   Final    BLOOD BLOOD RIGHT ARM Performed at [facility], [address] [place], [place] [address]    Special Requests   Final    BOTTLES DRAWN AEROBIC ONLY Blood Culture adequate volume Performed at [facility], [address], [place], [place] [address]    Culture   Final    NO GROWTH < 12 HOURS Performed at [facility], [address], [place], [place] [address]    Report Status PENDING  Incomplete   Labs: CBC: Recent Labs  Lab 05/21/22 1103 05/22/22 0457  WBC 7.2 8.4  NEUTROABS 4.7 6.8  HGB 15.6 15.5  HCT 48.1 48.4  MCV 90.4 94.0  PLT 202 178   Basic Metabolic Panel: Recent Labs  Lab 05/21/22 1103 05/22/22 0457  NA 138 137  K 3.8 3.6  CL 104 101  CO2 27 19*  GLUCOSE 99 238*  BUN 16 20  CREATININE 1.14 1.14  CALCIUM 8.8* 8.9  MG  --  2.0  PHOS  --  1.9*   Liver Function Tests: Recent Labs  Lab 05/21/22 1103 05/22/22 0457  AST 30 27  ALT 15 14  ALKPHOS 87 73  BILITOT 2.0* 1.6*  PROT 7.0 6.5  ALBUMIN 3.8 3.4*   CBG: Recent Labs  Lab 05/22/22 0718  GLUCAP 143*   Discharge  time spent: greater than 30 minutes.  Signed: Marguerita Merles, DO [facility] 05/22/2022

## 2022-05-22 NOTE — Progress Notes (Signed)
SATURATION QUALIFICATIONS: (This note is used to comply with regulatory documentation for home oxygen)  Patient Saturations on Room Air at Rest = 100%  Patient Saturations on Room Air while Ambulating = 98-100%  Please briefly explain why patient needs home oxygen:  No Needs.

## 2022-05-22 NOTE — Evaluation (Signed)
Physical Therapy Evaluation Only Patient Details Name: [name] Woods MRN: [id number] DOB: [birth year] Today's Date: 05/22/2022  History of Present Illness  [name] Woods is a 82 y.o. male  who presents to the hospital with worsening shortness of breath and cough. Medical history significant for but not limited to anxiety depression, arthritis, history of CAD with a reduced EF, history of dysrhythmia and atrial fibrillation, GERD, hypertension, hyperlipidemia as well as other comorbidities   Clinical Impression  Pt completes bed mobility ind, transfers without AD ind, amb full lap on hospital floor while pushing vitals machine, supv to mod ind. Pt with steady gait, no LOB, able to continue conversation without SOB, on RA with SpO2 98-100% and HR in the 60s-70s. No further acute PT needs identified, will sign off at this time.     Recommendations for follow up therapy are one component of a multi-disciplinary discharge planning process, led by the attending physician.  Recommendations may be updated based on patient status, additional functional criteria and insurance authorization.  Follow Up Recommendations       Assistance Recommended at Discharge None  Patient can return home with the following       Equipment Recommendations None recommended by PT  Recommendations for Other Services       Functional Status Assessment Patient has not had a recent decline in their functional status     Precautions / Restrictions Restrictions Weight Bearing Restrictions: No      Mobility  Bed Mobility Overal bed mobility: Independent   Transfers Overall transfer level: Independent Equipment used: None  General transfer comment: good steadiness with powering to stand, light BUE use    Ambulation/Gait Ambulation/Gait assistance: Supervision, Modified independent (Device/Increase time) Gait Distance (Feet): 350 Feet  Gait Pattern/deviations: WFL(Within Functional Limits) Gait  velocity: WFL  General Gait Details: pt pushing vitals machine with good bil foot clearance, no LOB, completes turns and navigates around obstacles, able to continue conversation without SOB, on RA with SpO2 98-100% and HR in the 60s-70s  Stairs            Wheelchair Mobility    Modified Rankin (Stroke Patients Only)       Balance Overall balance assessment: No apparent balance deficits (not formally assessed)        Pertinent Vitals/Pain Pain Assessment Pain Assessment: No/denies pain    Home Living Family/patient expects to be discharged to:: Private residence Living Arrangements: Spouse/significant other Available Help at Discharge: Family;Available 24 hours/day Type of Home: House Home Access: Stairs to enter Entrance Stairs-Rails: Doctor, general practice of Steps: 5 at backdoor, 10 frontdoor   Home Layout: One level Home Equipment: Cane - single point      Prior Function Prior Level of Function : Independent/Modified Independent;Driving  Mobility Comments: pt reports ind without AD, denies falls, still does yardwork ADLs Comments: pt reports ind with self care and splits household chores with spouse     Hand Dominance        Extremity/Trunk Assessment   Upper Extremity Assessment Upper Extremity Assessment: Overall WFL for tasks assessed    Lower Extremity Assessment Lower Extremity Assessment: Overall WFL for tasks assessed    Cervical / Trunk Assessment Cervical / Trunk Assessment: Normal  Communication   Communication: No difficulties  Cognition Arousal/Alertness: Awake/alert Behavior During Therapy: WFL for tasks assessed/performed Overall Cognitive Status: Within Functional Limits for tasks assessed     General Comments General comments (skin integrity, edema, etc.): on RA with SpO2 98-100% and HR  60s-70s    Exercises     Assessment/Plan    PT Assessment Patient does not need any further PT services  PT Problem List  Decreased activity tolerance       PT Treatment Interventions      PT Goals (Current goals can be found in the Care Plan section)  Acute Rehab PT Goals Patient Stated Goal: "ready to go home" PT Goal Formulation: All assessment and education complete, DC therapy    Frequency       Co-evaluation               AM-PAC PT "6 Clicks" Mobility  Outcome Measure Help needed turning from your back to your side while in a flat bed without using bedrails?: None Help needed moving from lying on your back to sitting on the side of a flat bed without using bedrails?: None Help needed moving to and from a bed to a chair (including a wheelchair)?: None Help needed standing up from a chair using your arms (e.g., wheelchair or bedside chair)?: None Help needed to walk in hospital room?: None Help needed climbing 3-5 steps with a railing? : A Little 6 Click Score: 23    End of Session   Activity Tolerance: Patient tolerated treatment well Patient left: in chair;with call bell/phone within reach Nurse Communication: Mobility status PT Visit Diagnosis: Other abnormalities of gait and mobility (R26.89)    Time: 1140-1158 PT Time Calculation (min) (ACUTE ONLY): 18 min   Charges:   PT Evaluation $PT Eval Low Complexity: 1 Low           Tori Xian Apostol PT, DPT 05/22/22, 12:09 PM

## 2022-05-22 NOTE — Progress Notes (Signed)
OT Cancellation Note  Patient Details Name: [name] Woods MRN: [id number] DOB: Mar 19, 1940   Cancelled Treatment:    Reason Eval/Treat Not Completed: OT screened, no needs identified, will sign off Per PT, patient is at baseline with ADLs. OT to sign off at this time.  Rosalio Loud, MS Acute Rehabilitation Department Office# [phone]  05/22/2022, 12:25 PM

## 2022-05-23 ENCOUNTER — Telehealth: Payer: Self-pay | Admitting: Family Medicine

## 2022-05-23 NOTE — Telephone Encounter (Signed)
Spoke with patient wife, stated patient was Dx with Metapneumovirus at the hospital yesterday  Patient resting now. Want to let us know Advise to schedule an OV for f/u. Patient wife will call for appt

## 2022-05-23 NOTE — Telephone Encounter (Signed)
Patient's wife is on the phone wanting Patient's diagnosis from Hospital.  Please call.

## 2022-05-24 LAB — CULTURE, BLOOD (ROUTINE X 2)

## 2022-05-25 LAB — CULTURE, BLOOD (ROUTINE X 2)
Special Requests: ADEQUATE
Special Requests: ADEQUATE

## 2022-05-25 LAB — LEGIONELLA PNEUMOPHILA SEROGP 1 UR AG: L. pneumophila Serogp 1 Ur Ag: NEGATIVE

## 2022-05-26 LAB — CULTURE, BLOOD (ROUTINE X 2): Culture: NO GROWTH

## 2022-05-30 ENCOUNTER — Other Ambulatory Visit: Payer: Self-pay | Admitting: Cardiology

## 2022-05-31 MED ORDER — METOPROLOL SUCCINATE ER 100 MG PO TB24: ORAL_TABLET | ORAL | 0 refills | Status: DC

## 2022-05-31 NOTE — Telephone Encounter (Signed)
Pt's medication Metoprolol was resent to pt's pharmacy because of conflicting directions. Rx corrected and resent to [facility] order pharmacy. Confirmation received.

## 2022-05-31 NOTE — Addendum Note (Signed)
Addended by: Margaret Pyle D on: 05/31/2022 08:46 AM   Modules accepted: Orders

## 2022-06-04 ENCOUNTER — Other Ambulatory Visit: Payer: Self-pay | Admitting: *Deleted

## 2022-06-04 MED ORDER — METOPROLOL SUCCINATE ER 100 MG PO TB24: ORAL_TABLET | ORAL | 0 refills | Status: DC

## 2022-06-07 DIAGNOSIS — N179 Acute kidney failure, unspecified: Secondary | ICD-10-CM | POA: Insufficient documentation

## 2022-06-07 NOTE — Progress Notes (Signed)
Cardiology Office Note:   Date:  06/08/2022  ID:  [name] Woods, DOB September 24, 1940, MRN [id number]  History of Present Illness:   [name] Woods is a 82 y.o. male who with chronic atrial fibrillation, minimal CAD and hypertension.  He had a cardiac catheterization on 03/24/2012 which showed very mild nonobstructive CAD.  EF was mildly reduced to 45% the time.  Repeat echocardiogram in June 2015 showed an EF improved to 55%.  ETT in March 2018 showed ST segment depressions in inferolateral leads, poor exercise tolerance.  Echocardiogram in March 2018 showed EF 55 to 60%, mild AI, small posterior pericardial effusion.  He did have 2 episode of presyncope and was seen in September 2018, it was decided to watch and wait to see if he has any more episodes.  His diltiazem was increased at the time.   Since we last talked to him in the hospital last month with pneumonia.  I reviewed these records.  Enzymes were negative.  He was in fibrillation with slow rate but did not have any issues.  There were no cardiac issues actually during that hospitalization.  He gets around slowly because of joint pains.  He walks with a buggy in the grocery store.  He does some household chores including weed eating. The patient denies any new symptoms such as chest discomfort, neck or arm discomfort. There has been no new shortness of breath, PND or orthopnea. There have been no reported palpitations, presyncope or syncope.    ROS: As stated in the HPI and negative for all other systems.  Studies Reviewed:    EKG: 05/21/2022 atrial fibrillation, left bundle branch block, premature ventricular contractions, no acute ST-T wave changes.   Risk Assessment/Calculations:    CHA2DS2-VASc Score = 4  {This indicates a 4.8% annual risk of stroke. The patient's score is based upon: CHF History: 0 HTN History: 1 Diabetes History: 0 Stroke History: 0 Vascular Disease History: 1 Age Score: 2 Gender Score: 0        Physical Exam:   VS:  BP 124/62   Pulse (!) 44   Ht 5\' 11"  (1.803 m)   Wt 197 lb 6.4 oz (89.5 kg)   SpO2 99%   BMI 27.53 kg/m    Wt Readings from Last 3 Encounters:  06/08/22 197 lb 6.4 oz (89.5 kg)  05/22/22 196 lb 15.7 oz (89.4 kg)  05/18/22 195 lb 12.8 oz (88.8 kg)     GEN: Well nourished, well developed in no acute distress NECK: No JVD; No carotid bruits CARDIAC: IrregularRR, no murmurs, rubs, gallops RESPIRATORY:  Clear to auscultation without rales, wheezing or rhonchi  ABDOMEN: Soft, non-tender, non-distended EXTREMITIES:  No edema; No deformity   ASSESSMENT AND PLAN:   CAD:   The patient has no new sypmtoms.  No further cardiovascular testing is indicated.  We will continue with aggressive risk reduction and meds as listed.  Chronic atrial fibrillation:    [name] has a CHA2DS2 - VASc score of 3.  No change in therapy.  AKI:    Creatinine was 1.14 most recently in the hospital.  No change in therapy.    HTN:  The blood pressure is at target but when I look back it is elevated most of the time and he says he has had some high blood pressure readings at home.  I am can have him keep a blood pressure diary and I might need to make adjustments to his medications.  Signed, Minus Breeding, MD

## 2022-06-08 ENCOUNTER — Encounter: Payer: Self-pay | Admitting: Cardiology

## 2022-06-08 ENCOUNTER — Ambulatory Visit: Payer: Medicare HMO | Attending: Cardiology | Admitting: Cardiology

## 2022-06-08 VITALS — BP 124/62 | HR 44 | Ht 71.0 in | Wt 197.4 lb

## 2022-06-08 DIAGNOSIS — I2510 Atherosclerotic heart disease of native coronary artery without angina pectoris: Secondary | ICD-10-CM | POA: Diagnosis not present

## 2022-06-08 DIAGNOSIS — I10 Essential (primary) hypertension: Secondary | ICD-10-CM

## 2022-06-08 DIAGNOSIS — I482 Chronic atrial fibrillation, unspecified: Secondary | ICD-10-CM | POA: Diagnosis not present

## 2022-06-08 DIAGNOSIS — N179 Acute kidney failure, unspecified: Secondary | ICD-10-CM

## 2022-06-08 NOTE — Patient Instructions (Signed)
   Follow-Up: At [facility], you and your health needs are our priority.  As part of our continuing mission to provide you with exceptional heart care, we have created designated Provider Care Teams.  These Care Teams include your primary Cardiologist (physician) and Advanced Practice Providers (APPs -  Physician Assistants and Nurse Practitioners) who all work together to provide you with the care you need, when you need it.  We recommend signing up for the patient portal called "MyChart".  Sign up information is provided on this After Visit Summary.  MyChart is used to connect with patients for Virtual Visits (Telemedicine).  Patients are able to view lab/test results, encounter notes, upcoming appointments, etc.  Non-urgent messages can be sent to your provider as well.   To learn more about what you can do with MyChart, go to [email or web address].    Your next appointment:   12 month(s)  Provider:   Rollene Rotunda, MD     Other Instructions  TAKE BLOOD PRESSURE 2 TIMES DAILY FOR 2 WEEKS AND SEND TO OFFICE

## 2022-06-14 ENCOUNTER — Other Ambulatory Visit: Payer: Self-pay

## 2022-06-14 ENCOUNTER — Other Ambulatory Visit (HOSPITAL_COMMUNITY): Payer: Self-pay

## 2022-06-14 ENCOUNTER — Other Ambulatory Visit: Payer: Self-pay | Admitting: Family Medicine

## 2022-06-14 DIAGNOSIS — M459 Ankylosing spondylitis of unspecified sites in spine: Secondary | ICD-10-CM

## 2022-06-15 ENCOUNTER — Telehealth: Payer: Self-pay | Admitting: *Deleted

## 2022-06-15 ENCOUNTER — Other Ambulatory Visit (HOSPITAL_COMMUNITY): Payer: Self-pay

## 2022-06-15 ENCOUNTER — Other Ambulatory Visit: Payer: Self-pay

## 2022-06-15 MED ORDER — HYDROCODONE-ACETAMINOPHEN 10-325 MG PO TABS
1.0000 | ORAL_TABLET | Freq: Four times a day (QID) | ORAL | 0 refills | Status: DC | PRN
Start: 2022-06-15 — End: 2022-07-16
  Filled 2022-06-15: qty 120, 30d supply, fill #0

## 2022-06-15 NOTE — Telephone Encounter (Signed)
Spoke with pt, he needs to get his out-of-pocket from his pharmacy and bring it by the office to fax to [employer] patient assistance.

## 2022-07-04 ENCOUNTER — Other Ambulatory Visit: Payer: Self-pay | Admitting: Cardiology

## 2022-07-09 ENCOUNTER — Encounter: Payer: Self-pay | Admitting: Family Medicine

## 2022-07-09 NOTE — Telephone Encounter (Signed)
Please advise 

## 2022-07-10 ENCOUNTER — Other Ambulatory Visit: Payer: Self-pay | Admitting: *Deleted

## 2022-07-10 DIAGNOSIS — M459 Ankylosing spondylitis of unspecified sites in spine: Secondary | ICD-10-CM

## 2022-07-10 NOTE — Telephone Encounter (Signed)
We can put in a DME order however I would recommend we have him see rheumatology or orthopedics if his symptoms have progressed to that point to see if there are any other options they can do to help with symptom management.   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  Katina Degree. Jimmey Ralph, MD 07/10/2022 8:43 AM

## 2022-07-16 ENCOUNTER — Other Ambulatory Visit: Payer: Self-pay | Admitting: Family Medicine

## 2022-07-16 ENCOUNTER — Other Ambulatory Visit (HOSPITAL_COMMUNITY): Payer: Self-pay

## 2022-07-16 DIAGNOSIS — M459 Ankylosing spondylitis of unspecified sites in spine: Secondary | ICD-10-CM

## 2022-07-16 MED ORDER — HYDROCODONE-ACETAMINOPHEN 10-325 MG PO TABS
1.0000 | ORAL_TABLET | Freq: Four times a day (QID) | ORAL | 0 refills | Status: DC | PRN
Start: 2022-07-16 — End: 2022-08-14
  Filled 2022-07-16: qty 120, 30d supply, fill #0

## 2022-07-18 DIAGNOSIS — C44629 Squamous cell carcinoma of skin of left upper limb, including shoulder: Secondary | ICD-10-CM | POA: Diagnosis not present

## 2022-07-18 DIAGNOSIS — L578 Other skin changes due to chronic exposure to nonionizing radiation: Secondary | ICD-10-CM | POA: Diagnosis not present

## 2022-07-18 DIAGNOSIS — C44619 Basal cell carcinoma of skin of left upper limb, including shoulder: Secondary | ICD-10-CM | POA: Diagnosis not present

## 2022-07-18 DIAGNOSIS — D225 Melanocytic nevi of trunk: Secondary | ICD-10-CM | POA: Diagnosis not present

## 2022-07-18 DIAGNOSIS — L570 Actinic keratosis: Secondary | ICD-10-CM | POA: Diagnosis not present

## 2022-07-18 DIAGNOSIS — D485 Neoplasm of uncertain behavior of skin: Secondary | ICD-10-CM | POA: Diagnosis not present

## 2022-07-19 DIAGNOSIS — M25561 Pain in right knee: Secondary | ICD-10-CM | POA: Diagnosis not present

## 2022-07-19 DIAGNOSIS — M25562 Pain in left knee: Secondary | ICD-10-CM | POA: Diagnosis not present

## 2022-07-20 ENCOUNTER — Other Ambulatory Visit (HOSPITAL_COMMUNITY): Payer: Self-pay

## 2022-07-26 DIAGNOSIS — C44619 Basal cell carcinoma of skin of left upper limb, including shoulder: Secondary | ICD-10-CM | POA: Diagnosis not present

## 2022-08-07 ENCOUNTER — Other Ambulatory Visit (HOSPITAL_COMMUNITY): Payer: Self-pay

## 2022-08-07 ENCOUNTER — Other Ambulatory Visit: Payer: Self-pay | Admitting: Cardiology

## 2022-08-07 ENCOUNTER — Other Ambulatory Visit: Payer: Self-pay | Admitting: Family Medicine

## 2022-08-07 DIAGNOSIS — I482 Chronic atrial fibrillation, unspecified: Secondary | ICD-10-CM

## 2022-08-08 ENCOUNTER — Other Ambulatory Visit (HOSPITAL_COMMUNITY): Payer: Self-pay

## 2022-08-08 MED ORDER — APIXABAN 5 MG PO TABS
5.0000 mg | ORAL_TABLET | Freq: Two times a day (BID) | ORAL | 1 refills | Status: DC
Start: 2022-08-08 — End: 2023-02-20
  Filled 2022-08-08 – 2022-08-17 (×2): qty 60, 30d supply, fill #0
  Filled 2022-10-04: qty 60, 30d supply, fill #1
  Filled 2022-11-05: qty 60, 30d supply, fill #2
  Filled 2022-12-25: qty 30, 15d supply, fill #3
  Filled 2023-01-15: qty 40, 20d supply, fill #4

## 2022-08-08 MED ORDER — CYCLOBENZAPRINE HCL 10 MG PO TABS
10.0000 mg | ORAL_TABLET | Freq: Three times a day (TID) | ORAL | 3 refills | Status: DC | PRN
  Filled 2022-08-08 – 2022-08-17 (×2): qty 30, 10d supply, fill #0
  Filled 2023-03-20: qty 30, 10d supply, fill #1
  Filled 2023-06-18: qty 30, 10d supply, fill #2
  Filled 2023-07-15: qty 30, 10d supply, fill #3

## 2022-08-08 NOTE — Telephone Encounter (Signed)
Prescription refill request for Eliquis received. Indication: Afib  Last office visit: 06/08/22 (Hochrein)  Scr: 1.14 (05/22/22)  Age: 82 Weight: 89.5kg  Appropriate dose. Refill sent.

## 2022-08-13 ENCOUNTER — Other Ambulatory Visit: Payer: Self-pay | Admitting: Cardiology

## 2022-08-14 ENCOUNTER — Other Ambulatory Visit (HOSPITAL_COMMUNITY): Payer: Self-pay

## 2022-08-14 ENCOUNTER — Other Ambulatory Visit: Payer: Self-pay | Admitting: Family Medicine

## 2022-08-14 DIAGNOSIS — M459 Ankylosing spondylitis of unspecified sites in spine: Secondary | ICD-10-CM

## 2022-08-15 ENCOUNTER — Other Ambulatory Visit (HOSPITAL_COMMUNITY): Payer: Self-pay

## 2022-08-15 MED ORDER — HYDROCODONE-ACETAMINOPHEN 10-325 MG PO TABS
1.0000 | ORAL_TABLET | Freq: Four times a day (QID) | ORAL | 0 refills | Status: DC | PRN
Start: 2022-08-15 — End: 2022-09-14
  Filled 2022-08-15 – 2022-08-17 (×2): qty 120, 30d supply, fill #0

## 2022-08-15 NOTE — Telephone Encounter (Signed)
 Needs office visit for further refills

## 2022-08-16 ENCOUNTER — Other Ambulatory Visit (HOSPITAL_COMMUNITY): Payer: Self-pay

## 2022-08-17 ENCOUNTER — Other Ambulatory Visit (HOSPITAL_COMMUNITY): Payer: Self-pay

## 2022-08-29 DIAGNOSIS — M170 Bilateral primary osteoarthritis of knee: Secondary | ICD-10-CM | POA: Diagnosis not present

## 2022-09-14 ENCOUNTER — Other Ambulatory Visit (HOSPITAL_COMMUNITY): Payer: Self-pay

## 2022-09-14 ENCOUNTER — Other Ambulatory Visit: Payer: Self-pay | Admitting: Family Medicine

## 2022-09-14 DIAGNOSIS — M459 Ankylosing spondylitis of unspecified sites in spine: Secondary | ICD-10-CM

## 2022-09-17 ENCOUNTER — Other Ambulatory Visit (HOSPITAL_COMMUNITY): Payer: Self-pay

## 2022-09-17 MED ORDER — HYDROCODONE-ACETAMINOPHEN 10-325 MG PO TABS
1.0000 | ORAL_TABLET | Freq: Four times a day (QID) | ORAL | 0 refills | Status: DC | PRN
Start: 2022-09-17 — End: 2022-10-18
  Filled 2022-09-17: qty 120, 30d supply, fill #0

## 2022-09-20 ENCOUNTER — Other Ambulatory Visit (HOSPITAL_COMMUNITY): Payer: Self-pay

## 2022-09-25 ENCOUNTER — Telehealth: Payer: Self-pay | Admitting: *Deleted

## 2022-09-25 DIAGNOSIS — M25561 Pain in right knee: Secondary | ICD-10-CM | POA: Diagnosis not present

## 2022-09-25 NOTE — Telephone Encounter (Signed)
Please schedule an OV with PCP for surgery clearance

## 2022-09-25 NOTE — Telephone Encounter (Addendum)
   Pre-operative Risk Assessment    Patient Name: [name] Woods  DOB: [birth year] MRN: [id number]    DATE OF LAST VISIT: 06/08/22 DR. HOCHREIN DATE OF NEXT VISIT: NONE  Request for Surgical Clearance   Procedure:   RIGHT TOTAL KNEE ARTHROPLASTY  Date of Surgery:  Clearance 12/11/22                                 Surgeon:  DR. MATTHEW OLIN Surgeon's Group or Practice Name:  [facility] Phone number:  [phone] ATTN: Rosalva Ferron Fax number:  [phone]   Type of Clearance Requested:   - Medical  - Pharmacy:  Hold Apixaban (Eliquis)     Type of Anesthesia:  Spinal   Additional requests/questions:    Elpidio Anis   09/25/2022, 10:41 AM

## 2022-09-25 NOTE — Telephone Encounter (Signed)
Patient need OV for surgery Clearance

## 2022-09-25 NOTE — Telephone Encounter (Signed)
Please advise holding Eliquis prior to right total knee arthroplasty on 12/11/2022.  Thank you!  DW

## 2022-09-26 NOTE — Telephone Encounter (Signed)
   Name: [name] Woods  DOB: [birth year]  MRN: [id number]  Primary Cardiologist: Rollene Rotunda, MD   Preoperative team, please contact this patient and set up a phone call appointment for further preoperative risk assessment. Please obtain consent and complete medication review. Thank you for your help.  I confirm that guidance regarding antiplatelet and oral anticoagulation therapy has been completed and, if necessary, noted below.  Per Pharm D, patient may hold Eliquis for 3 days prior to procedure.     Carlos Levering, NP 09/26/2022, 8:06 AM [facility]

## 2022-09-26 NOTE — Telephone Encounter (Signed)
Left message to call back to schedule tele pre op appt.  

## 2022-09-26 NOTE — Telephone Encounter (Signed)
Patient with diagnosis of afib on Eliquis for anticoagulation.    Procedure: right TKA Date of procedure: 12/11/22  CHA2DS2-VASc Score = 4  This indicates a 4.8% annual risk of stroke. The patient's score is based upon: CHF History: 0 HTN History: 1 Diabetes History: 0 Stroke History: 0 Vascular Disease History: 1 Age Score: 2 Gender Score: 0  CrCl 72mL/min Platelet count 178K  Per office protocol, patient can hold Eliquis for 3 days prior to procedure.    **This guidance is not considered finalized until pre-operative APP has relayed final recommendations.**

## 2022-09-28 ENCOUNTER — Telehealth: Payer: Self-pay | Admitting: *Deleted

## 2022-09-28 NOTE — Telephone Encounter (Signed)
Lmtcb and schedule a pre op tele clearance.

## 2022-10-02 ENCOUNTER — Telehealth: Payer: Self-pay

## 2022-10-02 NOTE — Addendum Note (Signed)
Addended by: Anselm Lis A on: 10/02/2022 10:13 AM   Modules accepted: Orders

## 2022-10-02 NOTE — Telephone Encounter (Signed)
Pt is scheduled for tele preop 11/4 at 10am. Med rec and consent done

## 2022-10-02 NOTE — Telephone Encounter (Signed)
Pt is scheduled for tele preop 11/4 at 10am. Med rec and consent done\     Patient Consent for Virtual Visit        [name] Woods has provided verbal consent on 10/02/2022 for a virtual visit (video or telephone).   CONSENT FOR VIRTUAL VISIT FOR:  [name] Woods  By participating in this virtual visit I agree to the following:  I hereby voluntarily request, consent and authorize [facility] and its employed or contracted physicians, physician assistants, nurse practitioners or other licensed health care professionals (the Practitioner), to provide me with telemedicine health care services (the "Services") as deemed necessary by the treating Practitioner. I acknowledge and consent to receive the Services by the Practitioner via telemedicine. I understand that the telemedicine visit will involve communicating with the Practitioner through live audiovisual communication technology and the disclosure of certain medical information by electronic transmission. I acknowledge that I have been given the opportunity to request an in-person assessment or other available alternative prior to the telemedicine visit and am voluntarily participating in the telemedicine visit.  I understand that I have the right to withhold or withdraw my consent to the use of telemedicine in the course of my care at any time, without affecting my right to future care or treatment, and that the Practitioner or I may terminate the telemedicine visit at any time. I understand that I have the right to inspect all information obtained and/or recorded in the course of the telemedicine visit and may receive copies of available information for a reasonable fee.  I understand that some of the potential risks of receiving the Services via telemedicine include:  Delay or interruption in medical evaluation due to technological equipment failure or disruption; Information transmitted may not be sufficient (e.g. poor  resolution of images) to allow for appropriate medical decision making by the Practitioner; and/or  In rare instances, security protocols could fail, causing a breach of personal health information.  Furthermore, I acknowledge that it is my responsibility to provide information about my medical history, conditions and care that is complete and accurate to the best of my ability. I acknowledge that Practitioner's advice, recommendations, and/or decision may be based on factors not within their control, such as incomplete or inaccurate data provided by me or distortions of diagnostic images or specimens that may result from electronic transmissions. I understand that the practice of medicine is not an exact science and that Practitioner makes no warranties or guarantees regarding treatment outcomes. I acknowledge that a copy of this consent can be made available to me via my patient portal [facility] MyChart), or I can request a printed copy by calling the office of  [facility].    I understand that my insurance will be billed for this visit.   I have read or had this consent read to me. I understand the contents of this consent, which adequately explains the benefits and risks of the Services being provided via telemedicine.  I have been provided ample opportunity to ask questions regarding this consent and the Services and have had my questions answered to my satisfaction. I give my informed consent for the services to be provided through the use of telemedicine in my medical care

## 2022-10-04 ENCOUNTER — Other Ambulatory Visit (HOSPITAL_COMMUNITY): Payer: Self-pay

## 2022-10-04 ENCOUNTER — Other Ambulatory Visit: Payer: Self-pay | Admitting: Family Medicine

## 2022-10-17 ENCOUNTER — Other Ambulatory Visit: Payer: Self-pay | Admitting: Family Medicine

## 2022-10-17 ENCOUNTER — Other Ambulatory Visit (HOSPITAL_COMMUNITY): Payer: Self-pay

## 2022-10-17 DIAGNOSIS — M459 Ankylosing spondylitis of unspecified sites in spine: Secondary | ICD-10-CM

## 2022-10-18 ENCOUNTER — Other Ambulatory Visit (HOSPITAL_COMMUNITY): Payer: Self-pay

## 2022-10-18 ENCOUNTER — Other Ambulatory Visit: Payer: Self-pay | Admitting: Family Medicine

## 2022-10-18 DIAGNOSIS — M459 Ankylosing spondylitis of unspecified sites in spine: Secondary | ICD-10-CM

## 2022-10-18 MED ORDER — HYDROCODONE-ACETAMINOPHEN 10-325 MG PO TABS
1.0000 | ORAL_TABLET | Freq: Four times a day (QID) | ORAL | 0 refills | Status: DC | PRN
Start: 2022-10-18 — End: 2022-11-19
  Filled 2022-10-18: qty 120, 30d supply, fill #0

## 2022-11-05 ENCOUNTER — Other Ambulatory Visit (HOSPITAL_COMMUNITY): Payer: Self-pay

## 2022-11-05 ENCOUNTER — Other Ambulatory Visit: Payer: Self-pay | Admitting: Family Medicine

## 2022-11-05 MED ORDER — TADALAFIL 5 MG PO TABS
5.0000 mg | ORAL_TABLET | Freq: Every day | ORAL | 1 refills | Status: DC
Start: 2022-11-05 — End: 2023-03-28
  Filled 2022-11-05: qty 90, 90d supply, fill #0

## 2022-11-05 NOTE — Telephone Encounter (Signed)
Pt needs this med to be refilled

## 2022-11-15 ENCOUNTER — Other Ambulatory Visit (HOSPITAL_COMMUNITY): Payer: Self-pay

## 2022-11-15 ENCOUNTER — Other Ambulatory Visit: Payer: Self-pay | Admitting: Family Medicine

## 2022-11-15 DIAGNOSIS — M459 Ankylosing spondylitis of unspecified sites in spine: Secondary | ICD-10-CM

## 2022-11-16 ENCOUNTER — Other Ambulatory Visit (HOSPITAL_COMMUNITY): Payer: Self-pay

## 2022-11-16 ENCOUNTER — Other Ambulatory Visit (HOSPITAL_BASED_OUTPATIENT_CLINIC_OR_DEPARTMENT_OTHER): Payer: Self-pay

## 2022-11-19 ENCOUNTER — Telehealth: Payer: Self-pay | Admitting: *Deleted

## 2022-11-19 ENCOUNTER — Other Ambulatory Visit (HOSPITAL_COMMUNITY): Payer: Self-pay

## 2022-11-19 ENCOUNTER — Ambulatory Visit (INDEPENDENT_AMBULATORY_CARE_PROVIDER_SITE_OTHER): Payer: Medicare HMO | Admitting: Family Medicine

## 2022-11-19 VITALS — BP 121/57 | HR 58 | Temp 98.0°F | Ht 71.0 in | Wt 206.2 lb

## 2022-11-19 DIAGNOSIS — I10 Essential (primary) hypertension: Secondary | ICD-10-CM | POA: Diagnosis not present

## 2022-11-19 DIAGNOSIS — I482 Chronic atrial fibrillation, unspecified: Secondary | ICD-10-CM

## 2022-11-19 DIAGNOSIS — M1990 Unspecified osteoarthritis, unspecified site: Secondary | ICD-10-CM | POA: Diagnosis not present

## 2022-11-19 DIAGNOSIS — I2510 Atherosclerotic heart disease of native coronary artery without angina pectoris: Secondary | ICD-10-CM

## 2022-11-19 DIAGNOSIS — M459 Ankylosing spondylitis of unspecified sites in spine: Secondary | ICD-10-CM

## 2022-11-19 MED ORDER — HYDROCODONE-ACETAMINOPHEN 10-325 MG PO TABS
1.0000 | ORAL_TABLET | Freq: Four times a day (QID) | ORAL | 0 refills | Status: DC | PRN
Start: 2022-11-19 — End: 2022-12-12
  Filled 2022-11-19: qty 120, 30d supply, fill #0

## 2022-11-19 NOTE — Assessment & Plan Note (Signed)
On Eliquis and Lipitor 10 mg daily per cardiology.  Last LDL 66.  He will come back soon for CPE and we can recheck lipids at that time.

## 2022-11-19 NOTE — Patient Instructions (Signed)
It was very nice to see you today!  We will fax over your surgical clearance form today.  I will refill your medications.  Will see you back in 6 months for annual physical.  Come back sooner if needed.  Return in about 6 months (around 05/20/2023) for Annual Physical.   Take care, Dr Jimmey Ralph  PLEASE NOTE:  If you had any lab tests, please let us know if you have not heard back within a few days. You may see your results on mychart before we have a chance to review them but we will give you a call once they are reviewed by [place].   If we ordered any referrals today, please let us know if you have not heard from their office within the next week.   If you had any urgent prescriptions sent in today, please check with the pharmacy within an hour of our visit to make sure the prescription was transmitted appropriately.   Please try these tips to maintain a healthy lifestyle:  Eat at least 3 REAL meals and 1-2 snacks per day.  Aim for no more than 5 hours between eating.  If you eat breakfast, please do so within one hour of getting up.   Each meal should contain half fruits/vegetables, one quarter protein, and one quarter carbs (no bigger than a computer mouse)  Cut down on sweet beverages. This includes juice, soda, and sweet tea.   Drink at least 1 glass of water with each meal and aim for at least 8 glasses per day  Exercise at least 150 minutes every week.

## 2022-11-19 NOTE — Assessment & Plan Note (Signed)
Following with orthopedics and has upcoming total knee replacement as above.  He is on Norco 10-3 25 every 6 hours as needed.  We will refill this today.  Database without red flags.  Medications help with pain and ability perform ADLs.  Cannot take NSAIDs due to anticoagulation.  Follow-up in 3 to 6 months for CPE.

## 2022-11-19 NOTE — Assessment & Plan Note (Addendum)
BP initially mildly elevated however at goal on recheck.  He is on diltiazem 240 mg daily and metoprolol succinate 100 mg twice daily per cardiology.

## 2022-11-19 NOTE — Progress Notes (Signed)
   [name] Woods is a 82 y.o. male who presents today for an office visit.  Assessment/Plan:  New/Acute Problems: Encounter for Surgical Clearance Overall low risk for complication from upcoming knee replacement.  They are not requiring [place] to check any labs.  Will complete surgical clearance form and fax back to requesting surgeons office.  He does have cardiac clearance pending and will defer anticoagulation management to them.  Chronic Problems Addressed Today: Osteoarthritis Following with orthopedics and has upcoming total knee replacement as above.  He is on Norco 10-3 25 every 6 hours as needed.  We will refill this today.  Database without red flags.  Medications help with pain and ability perform ADLs.  Cannot take NSAIDs due to anticoagulation.  Follow-up in 3 to 6 months for CPE.  Chronic atrial fibrillation (HCC) Following with you.  Anticoagulated on Eliquis and rate controlled on diltiazem and metoprolol.  Will defer management of his anticoagulation preoperatively to cardiology.  Essential hypertension BP initially mildly elevated however at goal on recheck.  He is on diltiazem 240 mg daily and metoprolol succinate 100 mg twice daily per cardiology.  CAD (coronary artery disease) On Eliquis and Lipitor 10 mg daily per cardiology.  Last LDL 66.  He will come back soon for CPE and we can recheck lipids at that time.     Subjective:  HPI:  See A/P for status of chronic conditions.  Patient is here today for cardiac clearance.  Will be undergoing right total knee arthroplasty next month.  Has a longstanding history of osteoarthritis.  Has not responded adequately to conservative measures.  Currently taking hydrocodone as needed for pain.  He has good functional status.  He has been following with cardiology and has upcoming surgical clearance with them soon.  He has been compliant with medications.  No significant side effects.       Objective:  Physical Exam: BP (!)  121/57   Pulse (!) 58   Temp 98 F (36.7 C) (Temporal)   Ht 5\' 11"  (1.803 m)   Wt 206 lb 3.2 oz (93.5 kg)   SpO2 97%   BMI 28.76 kg/m   Gen: No acute distress, resting comfortably CV: Regular rate and rhythm with no murmurs appreciated Pulm: Normal work of breathing, clear to auscultation bilaterally with no crackles, wheezes, or rhonchi Neuro: Grossly normal, moves all extremities Psych: Normal affect and thought content      [name] Woods M. [name] Ralph, MD 11/19/2022 11:43 AM

## 2022-11-19 NOTE — Assessment & Plan Note (Signed)
Following with you.  Anticoagulated on Eliquis and rate controlled on diltiazem and metoprolol.  Will defer management of his anticoagulation preoperatively to cardiology.

## 2022-11-19 NOTE — Telephone Encounter (Signed)
Surgery clearance faxed to [phone] Form placed to be scan in patient chart

## 2022-11-20 ENCOUNTER — Telehealth: Payer: Self-pay | Admitting: *Deleted

## 2022-11-20 ENCOUNTER — Other Ambulatory Visit (HOSPITAL_COMMUNITY): Payer: Self-pay

## 2022-11-20 NOTE — Telephone Encounter (Signed)
Preoperative clearance form faxed to [phone] on 11/19/2022 Form placed to be scan in patient chart

## 2022-11-22 ENCOUNTER — Other Ambulatory Visit (HOSPITAL_COMMUNITY): Payer: Self-pay

## 2022-12-03 ENCOUNTER — Ambulatory Visit: Payer: Medicare HMO | Attending: Cardiology | Admitting: Student

## 2022-12-03 DIAGNOSIS — Z01810 Encounter for preprocedural cardiovascular examination: Secondary | ICD-10-CM

## 2022-12-03 NOTE — Patient Instructions (Signed)
DUE TO COVID-19 ONLY TWO VISITORS  (aged 81 and older)  ARE ALLOWED TO COME WITH YOU AND STAY IN THE WAITING ROOM ONLY DURING PRE OP AND PROCEDURE.   **NO VISITORS ARE ALLOWED IN THE SHORT STAY AREA OR RECOVERY ROOM!!**  IF YOU WILL BE ADMITTED INTO THE HOSPITAL YOU ARE ALLOWED ONLY FOUR SUPPORT PEOPLE DURING VISITATION HOURS ONLY (7 AM -8PM)   The support person(s) must pass our screening, gel in and out, and wear a mask at all times, including in the patient's room. Patients must also wear a mask when staff or their support person are in the room. Visitors GUEST BADGE MUST BE WORN VISIBLY  One adult visitor may remain with you overnight and MUST be in the room by 8 P.M.     Your procedure is scheduled on: 12/11/22   Report to [facility] Main Entrance    Report to admitting at : 7:30 AM   Call this number if you have problems the morning of surgery [phone]   Do not eat food :After Midnight.   After Midnight you may have the following liquids until : 7:00 AM DAY OF SURGERY  Water Black Coffee (sugar ok, NO MILK/CREAM OR CREAMERS)  Tea (sugar ok, NO MILK/CREAM OR CREAMERS) regular and decaf                             Plain Jell-O (NO RED)                                           Fruit ices (not with fruit pulp, NO RED)                                     Popsicles (NO RED)                                                                  Juice: apple, WHITE grape, WHITE cranberry Sports drinks like Gatorade (NO RED)   The day of surgery:  Drink ONE (1) Pre-Surgery Clear Ensure at : 7:00 AM the morning of surgery. Drink in one sitting. Do not sip.  This drink was given to you during your hospital  pre-op appointment visit. Nothing else to drink after completing the  Pre-Surgery Clear Ensure or G2.          If you have questions, please contact your surgeon's office.  FOLLOW ANY ADDITIONAL PRE OP INSTRUCTIONS YOU RECEIVED FROM YOUR SURGEON'S OFFICE!!!   Oral  Hygiene is also important to reduce your risk of infection.                                    Remember - BRUSH YOUR TEETH THE MORNING OF SURGERY WITH YOUR REGULAR TOOTHPASTE  DENTURES WILL BE REMOVED PRIOR TO SURGERY PLEASE DO NOT APPLY "Poly grip" OR ADHESIVES!!!   Do NOT smoke after Midnight   Take these medicines the morning of surgery  with A SIP OF WATER: metoprolol,diltiazem,allopurinol.                              You may not have any metal on your body including hair pins, jewelry, and body piercing             Do not wear lotions, powders, perfumes/cologne, or deodorant              Men may shave face and neck.   Do not bring valuables to the hospital. [facility] IS NOT             RESPONSIBLE   FOR VALUABLES.   Contacts, glasses, or bridgework may not be worn into surgery.   Bring small overnight bag day of surgery.   DO NOT BRING YOUR HOME MEDICATIONS TO THE HOSPITAL. PHARMACY WILL DISPENSE MEDICATIONS LISTED ON YOUR MEDICATION LIST TO YOU DURING YOUR ADMISSION IN THE HOSPITAL!    Patients discharged on the day of surgery will not be allowed to drive home.  Someone NEEDS to stay with you for the first 24 hours after anesthesia.   Special Instructions: Bring a copy of your healthcare power of attorney and living will documents         the day of surgery if you haven't scanned them before.              Please read over the following fact sheets you were given: IF YOU HAVE QUESTIONS ABOUT YOUR PRE-OP INSTRUCTIONS PLEASE CALL [phone]      Pre-operative 5 CHG Bath Instructions   You can play a key role in reducing the risk of infection after surgery. Your skin needs to be as free of germs as possible. You can reduce the number of germs on your skin by washing with CHG (chlorhexidine gluconate) soap before surgery. CHG is an antiseptic soap that kills germs and continues to kill germs even after washing.   DO NOT use if you have an allergy to chlorhexidine/CHG or  antibacterial soaps. If your skin becomes reddened or irritated, stop using the CHG and notify one of our RNs at : [phone].   Please shower with the CHG soap starting 4 days before surgery using the following schedule:     Please keep in mind the following:  DO NOT shave, including legs and underarms, starting the day of your first shower.   You may shave your face at any point before/day of surgery.  Place clean sheets on your bed the day you start using CHG soap. Use a clean washcloth (not used since being washed) for each shower. DO NOT sleep with pets once you start using the CHG.   CHG Shower Instructions:  If you choose to wash your hair and private area, wash first with your normal shampoo/soap.  After you use shampoo/soap, rinse your hair and body thoroughly to remove shampoo/soap residue.  Turn the water OFF and apply about 3 tablespoons (45 ml) of CHG soap to a CLEAN washcloth.  Apply CHG soap ONLY FROM YOUR NECK DOWN TO YOUR TOES (washing for 3-5 minutes)  DO NOT use CHG soap on face, private areas, open wounds, or sores.  Pay special attention to the area where your surgery is being performed.  If you are having back surgery, having someone wash your back for you may be helpful. Wait 2 minutes after CHG soap is applied, then you may rinse off  the CHG soap.  Pat dry with a clean towel  Put on clean clothes/pajamas   If you choose to wear lotion, please use ONLY the CHG-compatible lotions on the back of this paper.     Additional instructions for the day of surgery: DO NOT APPLY any lotions, deodorants, cologne, or perfumes.   Put on clean/comfortable clothes.  Brush your teeth.  Ask your nurse before applying any prescription medications to the skin.   CHG Compatible Lotions   Aveeno Moisturizing lotion  Cetaphil Moisturizing Cream  Cetaphil Moisturizing Lotion  Clairol Herbal Essence Moisturizing Lotion, Dry Skin  Clairol Herbal Essence Moisturizing Lotion,  Extra Dry Skin  Clairol Herbal Essence Moisturizing Lotion, Normal Skin  Curel Age Defying Therapeutic Moisturizing Lotion with Alpha Hydroxy  Curel Extreme Care Body Lotion  Curel Soothing Hands Moisturizing Hand Lotion  Curel Therapeutic Moisturizing Cream, Fragrance-Free  Curel Therapeutic Moisturizing Lotion, Fragrance-Free  Curel Therapeutic Moisturizing Lotion, Original Formula  Eucerin Daily Replenishing Lotion  Eucerin Dry Skin Therapy Plus Alpha Hydroxy Crme  Eucerin Dry Skin Therapy Plus Alpha Hydroxy Lotion  Eucerin Original Crme  Eucerin Original Lotion  Eucerin Plus Crme Eucerin Plus Lotion  Eucerin TriLipid Replenishing Lotion  Keri Anti-Bacterial Hand Lotion  Keri Deep Conditioning Original Lotion Dry Skin Formula Softly Scented  Keri Deep Conditioning Original Lotion, Fragrance Free Sensitive Skin Formula  Keri Lotion Fast Absorbing Fragrance Free Sensitive Skin Formula  Keri Lotion Fast Absorbing Softly Scented Dry Skin Formula  Keri Original Lotion  Keri Skin Renewal Lotion Keri Silky Smooth Lotion  Keri Silky Smooth Sensitive Skin Lotion  Nivea Body Creamy Conditioning Oil  Nivea Body Extra Enriched Lotion  Nivea Body Original Lotion  Nivea Body Sheer Moisturizing Lotion Nivea Crme  Nivea Skin Firming Lotion  NutraDerm 30 Skin Lotion  NutraDerm Skin Lotion  NutraDerm Therapeutic Skin Cream  NutraDerm Therapeutic Skin Lotion  ProShield Protective Hand Cream  Provon moisturizing lotion   Incentive Spirometer  An incentive spirometer is a tool that can help keep your lungs clear and active. This tool measures how well you are filling your lungs with each breath. Taking long deep breaths may help reverse or decrease the chance of developing breathing (pulmonary) problems (especially infection) following: A long period of time when you are unable to move or be active. BEFORE THE PROCEDURE  If the spirometer includes an indicator to show your best effort, your  nurse or respiratory therapist will set it to a desired goal. If possible, sit up straight or lean slightly forward. Try not to slouch. Hold the incentive spirometer in an upright position. INSTRUCTIONS FOR USE  Sit on the edge of your bed if possible, or sit up as far as you can in bed or on a chair. Hold the incentive spirometer in an upright position. Breathe out normally. Place the mouthpiece in your mouth and seal your lips tightly around it. Breathe in slowly and as deeply as possible, raising the piston or the ball toward the top of the column. Hold your breath for 3-5 seconds or for as long as possible. Allow the piston or ball to fall to the bottom of the column. Remove the mouthpiece from your mouth and breathe out normally. Rest for a few seconds and repeat Steps 1 through 7 at least 10 times every 1-2 hours when you are awake. Take your time and take a few normal breaths between deep breaths. The spirometer may include an indicator to show your best effort. Use the indicator  as a goal to work toward during each repetition. After each set of 10 deep breaths, practice coughing to be sure your lungs are clear. If you have an incision (the cut made at the time of surgery), support your incision when coughing by placing a pillow or rolled up towels firmly against it. Once you are able to get out of bed, walk around indoors and cough well. You may stop using the incentive spirometer when instructed by your caregiver.  RISKS AND COMPLICATIONS Take your time so you do not get dizzy or light-headed. If you are in pain, you may need to take or ask for pain medication before doing incentive spirometry. It is harder to take a deep breath if you are having pain. AFTER USE Rest and breathe slowly and easily. It can be helpful to keep track of a log of your progress. Your caregiver can provide you with a simple table to help with this. If you are using the spirometer at home, follow these  instructions: SEEK MEDICAL CARE IF:  You are having difficultly using the spirometer. You have trouble using the spirometer as often as instructed. Your pain medication is not giving enough relief while using the spirometer. You develop fever of 100.5 F (38.1 C) or higher. SEEK IMMEDIATE MEDICAL CARE IF:  You cough up bloody sputum that had not been present before. You develop fever of 102 F (38.9 C) or greater. You develop worsening pain at or near the incision site. MAKE SURE YOU:  Understand these instructions. Will watch your condition. Will get help right away if you are not doing well or get worse. Document Released: 05/28/2006 Document Revised: 04/09/2011 Document Reviewed: 07/29/2006 [facility] Patient Information 2014 [place], [place].   ________________________________________________________________________

## 2022-12-03 NOTE — Progress Notes (Signed)
Virtual Visit via Telephone Note   Because of [name]'s co-morbid illnesses, he is at least at moderate risk for complications without adequate follow up.  This format is felt to be most appropriate for this patient at this time.  The patient did not have access to video technology/had technical difficulties with video requiring transitioning to audio format only (telephone).  All issues noted in this document were discussed and addressed.  No physical exam could be performed with this format.  Please refer to the patient's chart for his consent to telehealth for [facility].  Evaluation Performed:  Preoperative cardiovascular risk assessment _____________   Date:  12/03/2022   Patient ID:  [name] Woods, DOB [birth year], MRN [id number] Patient Location:  Home Provider location:   Office  Primary Care Provider:  Ardith Dark, MD Primary Cardiologist:  Rollene Rotunda, MD  Chief Complaint / Patient Profile   82 y.o. y/o male with a h/o nonobstructive CAD per angiography, PAF on anticoagulation, hypertension, hyperlipidemia, OSA, GERD who is pending right total knee arthroplasty by Dr. Charlann Boxer on 12/11/2022 and presents today for telephonic preoperative cardiovascular risk assessment.  History of Present Illness    [name] Woods is a 82 y.o. male who presents via audio/video conferencing for a telehealth visit today.  Pt was last seen in cardiology clinic on 06/08/2022 by Dr. Antoine Poche.  At that time [name] Woods was stable from a cardiac standpoint.  The patient is now pending procedure as outlined above. Since his last visit, he is doing well. Patient denies shortness of breath, dyspnea on exertion, lower extremity edema, orthopnea or PND. No chest pain, pressure, or tightness. Occasional palpitations at baseline. He is active performing light to moderate household activities, yard work, and HVAC work on the side.   Past Medical History    Past Medical  History:  Diagnosis Date   Ankylosing spondylitis (HCC)    Anxiety and depression 09/12/2015   Aortic atherosclerosis (HCC)    Arthritis    Bilateral inguinal hernia    CAD (coronary artery disease) 4098,1191   30% mid LAD lesion on cardiac cath   Chicken pox    Cholelithiasis    CHOLELITHIASIS, HX OF 04/25/2007   Qualifier: Diagnosis of  By: Alesia Richards     Diverticulosis 11/26/2018   Moderate, Left Noted on Colonoscopy   Dysrhythmia    atrial fibrillation   Gallstones    GERD (gastroesophageal reflux disease)    German measles    Hiatal hernia    History of colon polyps 11/26/2018   History of kidney stones    HLD (hyperlipidemia)    HTN (hypertension)    Hypertension    Nephrolithiasis    NEPHROLITHIASIS, HX OF 04/25/2007   Qualifier: Diagnosis of  By: Alesia Richards     Partial small bowel obstruction (HCC) 03/06/2020   Persistent atrial fibrillation (HCC)    Sleep apnea    No CPAP   Tubular adenoma of colon 03/1993   Ventricular hypertrophy    Past Surgical History:  Procedure Laterality Date   BIOPSY  03/08/2020   Procedure: BIOPSY;  Surgeon: Benancio Deeds, MD;  Location: [facility] ENDOSCOPY;  Service: Gastroenterology;;   CARDIAC CATHETERIZATION  03/2012   30% mid LAD lesion otherwise normal cors, LVEF 65%   CHOLECYSTECTOMY     COLONOSCOPY  11/26/2018   CYSTOSCOPY WITH RETROGRADE PYELOGRAM, URETEROSCOPY AND STENT PLACEMENT Right 01/02/2019   Procedure: CYSTOSCOPY WITH RETROGRADE PYELOGRAM, URETEROSCOPY AND  STENT PLACEMENT;  Surgeon: Sebastian Ache, MD;  Location: [facility] ORS;  Service: Urology;  Laterality: Right;  1 HR   ELBOW ARTHROPLASTY Left    ESOPHAGOGASTRODUODENOSCOPY (EGD) WITH PROPOFOL N/A 03/08/2020   Procedure: ESOPHAGOGASTRODUODENOSCOPY (EGD) WITH PROPOFOL;  Surgeon: Benancio Deeds, MD;  Location: [facility] ENDOSCOPY;  Service: Gastroenterology;  Laterality: N/A;   HAND SURGERY Left    HOLMIUM LASER APPLICATION Right 01/02/2019   Procedure: HOLMIUM  LASER APPLICATION;  Surgeon: Sebastian Ache, MD;  Location: [facility] ORS;  Service: Urology;  Laterality: Right;   KNEE SURGERY     x2 right   LEFT AND RIGHT HEART CATHETERIZATION WITH CORONARY ANGIOGRAM N/A 03/24/2012   Procedure: LEFT AND RIGHT HEART CATHETERIZATION WITH CORONARY ANGIOGRAM;  Surgeon: Tonny Bollman, MD;  Location: [facility] CATH LAB;  Service: Cardiovascular;  Laterality: N/A;   LEG FLUID REMOVAL RIGHT     TONSILLECTOMY     UPPER GI ENDOSCOPY      Allergies  Allergies  Allergen Reactions   Warfarin Other (See Comments)    GI bleeds   Nsaids Other (See Comments)    Only Tylenol is tolerated- is not supposed to take this because he's on Eliquis    Home Medications    Prior to Admission medications   Medication Sig Start Date End Date Taking? Authorizing Provider  acetaminophen (TYLENOL) 325 MG tablet Take 2 tablets (650 mg total) by mouth every 6 (six) hours as needed for mild pain (or Fever >/= 101). 05/22/22   Sheikh, Omair Latif, DO  allopurinol (ZYLOPRIM) 100 MG tablet TAKE 1 TABLET EVERY DAY 03/05/22   Ardith Dark, MD  apixaban (ELIQUIS) 5 MG TABS tablet Take 1 tablet (5 mg total) by mouth 2 (two) times daily. 08/08/22   Rollene Rotunda, MD  atorvastatin (LIPITOR) 10 MG tablet TAKE 1 TABLET EVERY DAY 03/05/22   Ardith Dark, MD  cyanocobalamin (VITAMIN B12) 1000 MCG tablet Take 1 tablet (1,000 mcg total) by mouth daily. 09/14/21   Ardith Dark, MD  cyclobenzaprine (FLEXERIL) 10 MG tablet Take 1 tablet (10 mg total) by mouth 3 (three) times daily as needed for muscle spasms. 08/08/22   Ardith Dark, MD  diltiazem [facility] CD) 240 MG 24 hr capsule TAKE 1 CAPSULE EVERY DAY 07/04/22   Rollene Rotunda, MD  furosemide (LASIX) 20 MG tablet TAKE 1 TABLET EVERY DAY 07/04/22   Rollene Rotunda, MD  HYDROcodone-acetaminophen (NORCO) 10-325 MG tablet Take 1 tablet by mouth every 6 (six) hours as needed. Patient taking differently: Take 1 tablet by mouth in the morning, at noon, in the  evening, and at bedtime. 11/19/22   Ardith Dark, MD  metoprolol succinate (TOPROL-XL) 100 MG 24 hr tablet TAKE 1 TABLET TWICE DAILY WITH OR IMMEDIATELY FOLLOWING MEALS (NEED MD APPOINTMENT FOR REFILLS) 08/13/22   Rollene Rotunda, MD  ondansetron (ZOFRAN) 4 MG tablet Take 1 tablet (4 mg total) by mouth every 6 (six) hours as needed for nausea. 05/22/22   Marguerita Merles Latif, DO  tadalafil (CIALIS) 5 MG tablet Take 1 tablet (5 mg total) by mouth daily. 11/05/22 05/04/23  Ardith Dark, MD  pantoprazole (PROTONIX) 40 MG tablet Take 1 tablet (40 mg total) by mouth daily. 03/09/20 04/15/20  Kathlen Mody, MD    Physical Exam    Vital Signs:  [name] Woods does not have vital signs available for review today.  Given telephonic nature of communication, physical exam is limited. AAOx3. NAD. Normal affect.  Speech and respirations are  unlabored.  Accessory Clinical Findings    None  Assessment & Plan    Primary Cardiologist: Rollene Rotunda, MD  Preoperative cardiovascular risk assessment.  Right total knee arthroplasty by Dr. Charlann Boxer on 12/11/2022.  Chart reviewed as part of pre-operative protocol coverage. According to the RCRI, patient has a 0.4% risk of MACE. Patient reports activity equivalent to >4.0 METS (performing light to moderate household activities including yard work, does some HVAC work on the side).   Given past medical history and time since last visit, based on ACC/AHA guidelines, [name] Woods would be at acceptable risk for the planned procedure without further cardiovascular testing.   Patient was advised that if he develops new symptoms prior to surgery to contact our office to arrange a follow-up appointment.  he verbalized understanding.  Per Pharm D, patient may hold Eliquis for 3 days prior to procedure.    I will route this recommendation to the requesting party via Epic fax function.  Please call with questions.  Time:   Today, I have spent 7 minutes with  the patient with telehealth technology discussing medical history, symptoms, and management plan.     Carlos Levering, NP  12/03/2022, 7:43 AM

## 2022-12-04 ENCOUNTER — Other Ambulatory Visit: Payer: Self-pay

## 2022-12-04 ENCOUNTER — Encounter (HOSPITAL_COMMUNITY)
Admission: RE | Admit: 2022-12-04 | Discharge: 2022-12-04 | Disposition: A | Discharge status: Home / Self Care | Payer: Medicare HMO | Source: Ambulatory Visit | Attending: Orthopedic Surgery | Admitting: Orthopedic Surgery

## 2022-12-04 ENCOUNTER — Encounter (HOSPITAL_COMMUNITY): Payer: Self-pay

## 2022-12-04 VITALS — HR 85 | Temp 97.5°F | Ht 71.0 in | Wt 199.0 lb

## 2022-12-04 DIAGNOSIS — Z01812 Encounter for preprocedural laboratory examination: Secondary | ICD-10-CM | POA: Diagnosis not present

## 2022-12-04 DIAGNOSIS — I4891 Unspecified atrial fibrillation: Secondary | ICD-10-CM | POA: Insufficient documentation

## 2022-12-04 DIAGNOSIS — Z01818 Encounter for other preprocedural examination: Secondary | ICD-10-CM

## 2022-12-04 DIAGNOSIS — G4730 Sleep apnea, unspecified: Secondary | ICD-10-CM | POA: Insufficient documentation

## 2022-12-04 DIAGNOSIS — M1711 Unilateral primary osteoarthritis, right knee: Secondary | ICD-10-CM | POA: Diagnosis not present

## 2022-12-04 DIAGNOSIS — I10 Essential (primary) hypertension: Secondary | ICD-10-CM | POA: Insufficient documentation

## 2022-12-04 DIAGNOSIS — I2510 Atherosclerotic heart disease of native coronary artery without angina pectoris: Secondary | ICD-10-CM | POA: Insufficient documentation

## 2022-12-04 HISTORY — DX: Malignant (primary) neoplasm, unspecified: C80.1

## 2022-12-04 HISTORY — DX: Pneumonia, unspecified organism: J18.9

## 2022-12-04 LAB — CBC
HCT: 48.4 % (ref 39.0–52.0)
Hemoglobin: 15.9 g/dL (ref 13.0–17.0)
MCH: 30.1 pg (ref 26.0–34.0)
MCHC: 32.9 g/dL (ref 30.0–36.0)
MCV: 91.5 fL (ref 80.0–100.0)
Platelets: 208 10*3/uL (ref 150–400)
RBC: 5.29 MIL/uL (ref 4.22–5.81)
RDW: 13.8 % (ref 11.5–15.5)
WBC: 7.8 10*3/uL (ref 4.0–10.5)
nRBC: 0 % (ref 0.0–0.2)

## 2022-12-04 LAB — BASIC METABOLIC PANEL
Anion gap: 9 (ref 5–15)
BUN: 12 mg/dL (ref 8–23)
CO2: 29 mmol/L (ref 22–32)
Calcium: 9.3 mg/dL (ref 8.9–10.3)
Chloride: 104 mmol/L (ref 98–111)
Creatinine, Ser: 1.14 mg/dL (ref 0.61–1.24)
GFR, Estimated: >60 mL/min (ref >60)
Glucose, Bld: 92 mg/dL (ref 70–99)
Potassium: 3.6 mmol/L (ref 3.5–5.1)
Sodium: 142 mmol/L (ref 135–145)

## 2022-12-04 LAB — SURGICAL PCR SCREEN
MRSA, PCR: NEGATIVE
Staphylococcus aureus: NEGATIVE

## 2022-12-04 NOTE — Progress Notes (Signed)
For Anesthesia: PCP - Ardith Dark, MD  Cardiologist - Rollene Rotunda, MD  Clearance: Carlos Levering, NP : 12/03/22  Bowel Prep reminder:  Chest x-ray - 05/22/22 EKG - 05/21/22 Stress Test -  ECHO - 04/16/16 Cardiac Cath - 2014 Pacemaker/ICD device last checked: Pacemaker orders received: Device Rep notified:  Spinal Cord Stimulator: N/A  Sleep Study - Yes CPAP - NO  Fasting Blood Sugar - N/A Checks Blood Sugar _____ times a day Date and result of last Hgb A1c-  Last dose of GLP1 agonist- N/A GLP1 instructions:   Last dose of SGLT-2 inhibitors- N/A SGLT-2 instructions:   Blood Thinner Instructions: Eliquis will be hold after 12/07/22. Aspirin Instructions: Last Dose:  Activity level: Can go up a flight of stairs and activities of daily living without stopping and without chest pain and/or shortness of breath   Able to exercise without chest pain and/or shortness of breath  Anesthesia review: Hx: CAD,Afib,HTN,OSA(NO CPAP).  Patient denies shortness of breath, fever, cough and chest pain at PAT appointment   Patient verbalized understanding of instructions that were given to them at the PAT appointment. Patient was also instructed that they will need to review over the PAT instructions again at home before surgery.

## 2022-12-05 NOTE — Progress Notes (Signed)
Anesthesia Chart Review   Case: [id number] Date/Time: 12/11/22 0945   Procedure: TOTAL KNEE ARTHROPLASTY (Right: Knee)   Anesthesia type: Spinal   Pre-op diagnosis: Right knee osteoarthritis   Location: [facility] ROOM 09 / [facility]   Surgeons: Durene Romans, MD       DISCUSSION:82 y.o. never smoker with h/o HTN, atrial fibrillation, nonobstructive CAD, sleep apnea, right knee OA scheduled for above procedure 12/11/2022 with Dr. Durene Romans.   Per cardiology preoperative evaluation 12/03/2022, "Preoperative cardiovascular risk assessment.  Right total knee arthroplasty by Dr. Charlann Boxer on 12/11/2022.   Chart reviewed as part of pre-operative protocol coverage. According to the RCRI, patient has a 0.4% risk of MACE. Patient reports activity equivalent to >4.0 METS (performing light to moderate household activities including yard work, does some HVAC work on the side).    Given past medical history and time since last visit, based on ACC/AHA guidelines, JYQUAN KENLEY would be at acceptable risk for the planned procedure without further cardiovascular testing.    Patient was advised that if he develops new symptoms prior to surgery to contact our office to arrange a follow-up appointment.  he verbalized understanding.   Per Pharm D, patient may hold Eliquis for 3 days prior to procedure. "  Per cardiology OV note 11/19/22, "Encounter for Surgical Clearance Overall low risk for complication from upcoming knee replacement.  They are not requiring [place] to check any labs.  Will complete surgical clearance form and fax back to requesting surgeons office.  He does have cardiac clearance pending and will defer anticoagulation management to them."  VS: Pulse 85   Temp (!) 36.4 C (Oral)   Ht 5\' 11"  (1.803 m)   Wt 90.3 kg   SpO2 95%   BMI 27.75 kg/m   PROVIDERS: Ardith Dark, MD is PCP   Cardiologist - Rollene Rotunda, MD  LABS: Labs reviewed: Acceptable for surgery. (all labs ordered are listed,  but only abnormal results are displayed)  Labs Reviewed  SURGICAL PCR SCREEN  BASIC METABOLIC PANEL  CBC     IMAGES:   EKG:   CV: Echo 04/16/2016 Study Conclusions   - Left ventricle: The cavity size was normal. Systolic function was    normal. The estimated ejection fraction was in the range of 55%    to 60%. Wall motion was normal; there were no regional wall    motion abnormalities.  - Aortic valve: There was mild regurgitation.  - Left atrium: The atrium was mildly dilated.  - Atrial septum: No defect or patent foramen ovale was identified.  - Pericardium, extracardiac: Small posteriro pericardial effusion  Past Medical History:  Diagnosis Date   Ankylosing spondylitis (HCC)    Anxiety and depression 09/12/2015   Aortic atherosclerosis (HCC)    Arthritis    Bilateral inguinal hernia    CAD (coronary artery disease) 4540,9811   30% mid LAD lesion on cardiac cath   Cancer [facility])    left arm   Chicken pox    Cholelithiasis    CHOLELITHIASIS, HX OF 04/25/2007   Qualifier: Diagnosis of  By: Alesia Richards     Diverticulosis 11/26/2018   Moderate, Left Noted on Colonoscopy   Dysrhythmia    atrial fibrillation   Gallstones    GERD (gastroesophageal reflux disease)    German measles    Hiatal hernia    History of colon polyps 11/26/2018   History of kidney stones    HLD (hyperlipidemia)    HTN (  hypertension)    Hypertension    Nephrolithiasis    NEPHROLITHIASIS, HX OF 04/25/2007   Qualifier: Diagnosis of  By: Alesia Richards     Partial small bowel obstruction (HCC) 03/06/2020   Persistent atrial fibrillation (HCC)    Pneumonia    Sleep apnea    No CPAP   Tubular adenoma of colon 03/1993   Ventricular hypertrophy     Past Surgical History:  Procedure Laterality Date   BIOPSY  03/08/2020   Procedure: BIOPSY;  Surgeon: Benancio Deeds, MD;  Location: [facility];  Service: Gastroenterology;;   CARDIAC CATHETERIZATION  03/2012   30% mid LAD  lesion otherwise normal cors, LVEF 65%   CHOLECYSTECTOMY     COLONOSCOPY  11/26/2018   CYSTOSCOPY WITH RETROGRADE PYELOGRAM, URETEROSCOPY AND STENT PLACEMENT Right 01/02/2019   Procedure: CYSTOSCOPY WITH RETROGRADE PYELOGRAM, URETEROSCOPY AND STENT PLACEMENT;  Surgeon: Sebastian Ache, MD;  Location[facility];  Service: Urology;  Laterality: Right;  1 HR   ELBOW ARTHROPLASTY Left    ESOPHAGOGASTRODUODENOSCOPY (EGD) WITH PROPOFOL N/A 03/08/2020   Procedure: ESOPHAGOGASTRODUODENOSCOPY (EGD) WITH PROPOFOL;  Surgeon: Benancio Deeds, MD;  Location: [facility]COPY;  Service: Gastroenterology;  Laterality: N/A;   HAND SURGERY Left    HOLMIUM LASER APPLICATION Right 01/02/2019   Procedure: HOLMIUM LASER APPLICATION;  Surgeon: Sebastian Ache, MD;  Location[facility];  Service: Urology;  Laterality: Right;   KNEE SURGERY     x2 right   LEFT AND RIGHT HEART CATHETERIZATION WITH CORONARY ANGIOGRAM N/A 03/24/2012   Procedure: LEFT AND RIGHT HEART CATHETERIZATION WITH CORONARY ANGIOGRAM;  Surgeon: Tonny Bollman, MD;  Location: [facility] CATH LAB;  Service: Cardiovascular;  Laterality: N/A;   LEG FLUID REMOVAL RIGHT     TONSILLECTOMY     UPPER GI ENDOSCOPY      MEDICATIONS:  acetaminophen (TYLENOL) 325 MG tablet   allopurinol (ZYLOPRIM) 100 MG tablet   apixaban (ELIQUIS) 5 MG TABS tablet   atorvastatin (LIPITOR) 10 MG tablet   cyanocobalamin (VITAMIN B12) 1000 MCG tablet   cyclobenzaprine (FLEXERIL) 10 MG tablet   diltiazem (CARDIZEM CD) 240 MG 24 hr capsule   furosemide (LASIX) 20 MG tablet   HYDROcodone-acetaminophen (NORCO) 10-325 MG tablet   metoprolol succinate (TOPROL-XL) 100 MG 24 hr tablet   ondansetron (ZOFRAN) 4 MG tablet   tadalafil (CIALIS) 5 MG tablet   No current facility-administered medications for this encounter.     Jodell Cipro Ward, PA-C [facility] [phone]

## 2022-12-05 NOTE — Anesthesia Preprocedure Evaluation (Addendum)
Anesthesia Evaluation  Patient identified by MRN, date of birth, ID band Patient awake    Reviewed: Allergy & Precautions, H&P , NPO status , Patient's Chart, lab work & pertinent test results  Airway Mallampati: II   Neck ROM: full    Dental   Pulmonary sleep apnea    breath sounds clear to auscultation       Cardiovascular hypertension, + CAD  + dysrhythmias Atrial Fibrillation  Rhythm:irregular Rate:Normal     Neuro/Psych  PSYCHIATRIC DISORDERS Anxiety Depression     Neuromuscular disease    GI/Hepatic ,GERD  ,,  Endo/Other    Renal/GU stones     Musculoskeletal  (+) Arthritis ,  Ankylosing spondylitis   Abdominal   Peds  Hematology   Anesthesia Other Findings   Reproductive/Obstetrics                             Anesthesia Physical Anesthesia Plan  ASA: 3  Anesthesia Plan: MAC and Spinal   Post-op Pain Management: Regional block*   Induction: Intravenous  PONV Risk Score and Plan: 1 and Ondansetron, Treatment may vary due to age or medical condition and Propofol infusion  Airway Management Planned: Simple Face Mask  Additional Equipment:   Intra-op Plan:   Post-operative Plan:   Informed Consent: I have reviewed the patients History and Physical, chart, labs and discussed the procedure including the risks, benefits and alternatives for the proposed anesthesia with the patient or authorized representative who has indicated his/her understanding and acceptance.     Dental advisory given  Plan Discussed with: CRNA, Anesthesiologist and Surgeon  Anesthesia Plan Comments: (See PAT note 12/04/2022)       Anesthesia Quick Evaluation

## 2022-12-10 NOTE — H&P (Signed)
TOTAL KNEE ADMISSION H&P  Patient is being admitted for right total knee arthroplasty.  Therapy Plans: outpatient therapy at [facility] Disposition: Home with wife Planned DVT Prophylaxis: Eliquis 5 mg BID DME needed: none PCP: Dr. Jimmey Ralph - clearance received Cardio: Dr. Antoine Poche - phone call on 11/4 TXA: IV Allergies: warfarin - hematuria Anesthesia Concerns: none BMI: 27.1 Last HgbA1c: none   Other: - No hx of VTE or cancer - a fib on eliquis - hydrocodone 10 mg q6h at baseline - -- will change to oxycodone - oxycodone, robaxin, tylenol - Discussed HHPT > agreeable to OPPT - ice machine at the hospital  Subjective:  Chief Complaint:right knee pain.  HPI: [name] Woods, 82 y.o. male, has a history of pain and functional disability in the right knee due to arthritis and has failed non-surgical conservative treatments for greater than 12 weeks to includeNSAID's and/or analgesics, corticosteriod injections, and activity modification.  Onset of symptoms was gradual, starting 2 years ago with gradually worsening course since that time. The patient noted prior procedures on the knee to include  arthroscopy and menisectomy on the right knee(s).  Patient currently rates pain in the right knee(s) at 8 out of 10 with activity. Patient has worsening of pain with activity and weight bearing, pain that interferes with activities of daily living, and pain with passive range of motion.  Patient has evidence of joint space narrowing by imaging studies. There is no active infection.  Patient Active Problem List   Diagnosis Date Noted   B12 deficiency 09/14/2021   Dermatitis 08/17/2021   Erectile dysfunction 08/17/2021   BPH (benign prostatic hyperplasia) 08/17/2021   Paresthesia 08/17/2021   Osteoarthritis 04/15/2020   Gastric nodule    OSA (obstructive sleep apnea) 03/06/2020   Chronic atrial fibrillation (HCC) 02/12/2019   Kidney stones, calcium oxalate 12/03/2018   Hyperlipidemia 02/06/2018    Encounter for chronic pain management 03/01/2017   Hyperglycemia 04/23/2016   Chronic anticoagulation 03/02/2015   Gout 06/30/2014   CAD (coronary artery disease) 03/21/2012   GERD (gastroesophageal reflux disease) 03/21/2012   Essential hypertension 04/25/2007   Past Medical History:  Diagnosis Date   Ankylosing spondylitis (HCC)    Anxiety and depression 09/12/2015   Aortic atherosclerosis (HCC)    Arthritis    Bilateral inguinal hernia    CAD (coronary artery disease) 1660,6301   30% mid LAD lesion on cardiac cath   Cancer [facility])    left arm   Chicken pox    Cholelithiasis    CHOLELITHIASIS, HX OF 04/25/2007   Qualifier: Diagnosis of  By: Alesia Richards     Diverticulosis 11/26/2018   Moderate, Left Noted on Colonoscopy   Dysrhythmia    atrial fibrillation   Gallstones    GERD (gastroesophageal reflux disease)    German measles    Hiatal hernia    History of colon polyps 11/26/2018   History of kidney stones    HLD (hyperlipidemia)    HTN (hypertension)    Hypertension    Nephrolithiasis    NEPHROLITHIASIS, HX OF 04/25/2007   Qualifier: Diagnosis of  By: Alesia Richards     Partial small bowel obstruction (HCC) 03/06/2020   Persistent atrial fibrillation (HCC)    Pneumonia    Sleep apnea    No CPAP   Tubular adenoma of colon 03/1993   Ventricular hypertrophy     Past Surgical History:  Procedure Laterality Date   BIOPSY  03/08/2020   Procedure: BIOPSY;  Surgeon: Adela Lank,  Willaim Rayas, MD;  Location: [facility] ENDOSCOPY;  Service: Gastroenterology;;   CARDIAC CATHETERIZATION  03/2012   30% mid LAD lesion otherwise normal cors, LVEF 65%   CHOLECYSTECTOMY     COLONOSCOPY  11/26/2018   CYSTOSCOPY WITH RETROGRADE PYELOGRAM, URETEROSCOPY AND STENT PLACEMENT Right 01/02/2019   Procedure: CYSTOSCOPY WITH RETROGRADE PYELOGRAM, URETEROSCOPY AND STENT PLACEMENT;  Surgeon: Sebastian Ache, MD;  Location[facility];  Service: Urology;  Laterality: Right;  1 HR   ELBOW  ARTHROPLASTY Left    ESOPHAGOGASTRODUODENOSCOPY (EGD) WITH PROPOFOL N/A 03/08/2020   Procedure: ESOPHAGOGASTRODUODENOSCOPY (EGD) WITH PROPOFOL;  Surgeon: Benancio Deeds, MD;  Location[facility] ENDOSCOPY;  Service: Gastroenterology;  Laterality: N/A;   HAND SURGERY Left    HOLMIUM LASER APPLICATION Right 01/02/2019   Procedure: HOLMIUM LASER APPLICATION;  Surgeon: Sebastian Ache, MD;  Location[facility];  Service: Urology;  Laterality: Right;   KNEE SURGERY     x2 right   LEFT AND RIGHT HEART CATHETERIZATION WITH CORONARY ANGIOGRAM N/A 03/24/2012   Procedure: LEFT AND RIGHT HEART CATHETERIZATION WITH CORONARY ANGIOGRAM;  Surgeon: Tonny Bollman, MD;  Location: [facility] CATH LAB;  Service: Cardiovascular;  Laterality: N/A;   LEG FLUID REMOVAL RIGHT     TONSILLECTOMY     UPPER GI ENDOSCOPY      No current facility-administered medications for this encounter.   Current Outpatient Medications  Medication Sig Dispense Refill Last Dose   acetaminophen (TYLENOL) 325 MG tablet Take 2 tablets (650 mg total) by mouth every 6 (six) hours as needed for mild pain (or Fever >/= 101). 20 tablet 0    allopurinol (ZYLOPRIM) 100 MG tablet TAKE 1 TABLET EVERY DAY 90 tablet 3    apixaban (ELIQUIS) 5 MG TABS tablet Take 1 tablet (5 mg total) by mouth 2 (two) times daily. 180 tablet 1    atorvastatin (LIPITOR) 10 MG tablet TAKE 1 TABLET EVERY DAY 90 tablet 3    cyanocobalamin (VITAMIN B12) 1000 MCG tablet Take 1 tablet (1,000 mcg total) by mouth daily. 100 tablet 3    cyclobenzaprine (FLEXERIL) 10 MG tablet Take 1 tablet (10 mg total) by mouth 3 (three) times daily as needed for muscle spasms. 30 tablet 3    diltiazem (CARDIZEM CD) 240 MG 24 hr capsule TAKE 1 CAPSULE EVERY DAY 90 capsule 3    furosemide (LASIX) 20 MG tablet TAKE 1 TABLET EVERY DAY 90 tablet 3    HYDROcodone-acetaminophen (NORCO) 10-325 MG tablet Take 1 tablet by mouth every 6 (six) hours as needed. (Patient taking differently: Take 1 tablet by mouth in the  morning, at noon, in the evening, and at bedtime.) 120 tablet 0    metoprolol succinate (TOPROL-XL) 100 MG 24 hr tablet TAKE 1 TABLET TWICE DAILY WITH OR IMMEDIATELY FOLLOWING MEALS (NEED MD APPOINTMENT FOR REFILLS) 180 tablet 2    ondansetron (ZOFRAN) 4 MG tablet Take 1 tablet (4 mg total) by mouth every 6 (six) hours as needed for nausea. 20 tablet 0    tadalafil (CIALIS) 5 MG tablet Take 1 tablet (5 mg total) by mouth daily. 90 tablet 1    Allergies  Allergen Reactions   Warfarin Other (See Comments)    GI bleeds   Nsaids Other (See Comments)    Only Tylenol is tolerated- is not supposed to take this because he's on Eliquis    Social History   Tobacco Use   Smoking status: Never   Smokeless tobacco: Never  Substance Use Topics   Alcohol use: No  Family History  Problem Relation Age of Onset   Heart disease Father 65       Deceased   Heart attack Father 72   Heart disease Brother 46       "Died in his sleep"   Arthritis/Rheumatoid Mother        Deceased-86   Hyperlipidemia Brother    Stroke Brother 29       Deceased   Other Sister        Air cabin crew in Home   Other Sister        MVA   Other Brother        Carjacked-killed   Dementia Paternal Grandmother    Cancer Paternal Uncle    Healthy Brother    Healthy Son        X3   Healthy Daughter        x1   Colon cancer Neg Hx    Esophageal cancer Neg Hx    Stomach cancer Neg Hx    Rectal cancer Neg Hx      Review of Systems  Constitutional:  Negative for chills and fever.  Respiratory:  Negative for cough and shortness of breath.   Cardiovascular:  Negative for chest pain.  Gastrointestinal:  Negative for nausea and vomiting.  Musculoskeletal:  Positive for arthralgias.     Objective:  Physical Exam Well nourished and well developed. General: Alert and oriented x3, cooperative and pleasant, no acute distress. Head: normocephalic, atraumatic, neck supple. Eyes: EOMI.  Musculoskeletal: Bilateral knee  exams: No palpable effusions, warmth erythema 5 degree flexion contractures with flexion over 100 degrees with crepitation and tightness No significant lower extremity edema, erythema or calf tenderness  Calves soft and nontender. Motor function intact in LE. Strength 5/5 LE bilaterally. Neuro: Distal pulses 2+. Sensation to light touch intact in LE.  Vital signs in last 24 hours:    Labs:   Estimated body mass index is 27.75 kg/m as calculated from the following:   Height as of 12/04/22: 5\' 11"  (1.803 m).   Weight as of 12/04/22: 90.3 kg.   Imaging Review Plain radiographs demonstrate severe degenerative joint disease of the right knee(s). The overall alignment isneutral. The bone quality appears to be adequate for age and reported activity level.      Assessment/Plan:  End stage arthritis, right knee   The patient history, physical examination, clinical judgment of the provider and imaging studies are consistent with end stage degenerative joint disease of the right knee(s) and total knee arthroplasty is deemed medically necessary. The treatment options including medical management, injection therapy arthroscopy and arthroplasty were discussed at length. The risks and benefits of total knee arthroplasty were presented and reviewed. The risks due to aseptic loosening, infection, stiffness, patella tracking problems, thromboembolic complications and other imponderables were discussed. The patient acknowledged the explanation, agreed to proceed with the plan and consent was signed. Patient is being admitted for inpatient treatment for surgery, pain control, PT, OT, prophylactic antibiotics, VTE prophylaxis, progressive ambulation and ADL's and discharge planning. The patient is planning to be discharged  home.     Patient's anticipated LOS is less than 2 midnights, meeting these requirements: - Younger than 36 - Lives within 1 hour of care - Has a competent adult at home to  recover with post-op recover - NO history of  - Chronic pain requiring opiods  - Diabetes  - Coronary Artery Disease  - Heart failure  - Heart attack  - Stroke  -  DVT/VTE  - Cardiac arrhythmia  - Respiratory Failure/COPD  - Renal failure  - Anemia  - Advanced Liver disease  Rosalene Billings, PA-C Orthopedic Surgery [facility] [place] [phone]

## 2022-12-11 ENCOUNTER — Ambulatory Visit (HOSPITAL_COMMUNITY): Payer: Medicare HMO | Admitting: Anesthesiology

## 2022-12-11 ENCOUNTER — Encounter (HOSPITAL_COMMUNITY)
Admission: RE | Disposition: A | Discharge status: Home / Self Care | Payer: Self-pay | Source: Ambulatory Visit | Attending: Orthopedic Surgery

## 2022-12-11 ENCOUNTER — Other Ambulatory Visit: Payer: Self-pay

## 2022-12-11 ENCOUNTER — Ambulatory Visit (HOSPITAL_COMMUNITY): Payer: Medicare HMO | Admitting: Physician Assistant

## 2022-12-11 ENCOUNTER — Encounter (HOSPITAL_COMMUNITY): Payer: Self-pay | Admitting: Orthopedic Surgery

## 2022-12-11 ENCOUNTER — Observation Stay (HOSPITAL_COMMUNITY)
Admission: RE | Admit: 2022-12-11 | Discharge: 2022-12-12 | Disposition: A | Discharge status: Home / Self Care | Payer: Medicare HMO | Source: Ambulatory Visit | Attending: Orthopedic Surgery | Admitting: Orthopedic Surgery

## 2022-12-11 DIAGNOSIS — Z7901 Long term (current) use of anticoagulants: Secondary | ICD-10-CM | POA: Diagnosis not present

## 2022-12-11 DIAGNOSIS — M1711 Unilateral primary osteoarthritis, right knee: Principal | ICD-10-CM | POA: Insufficient documentation

## 2022-12-11 DIAGNOSIS — Z96651 Presence of right artificial knee joint: Principal | ICD-10-CM

## 2022-12-11 DIAGNOSIS — I10 Essential (primary) hypertension: Secondary | ICD-10-CM | POA: Insufficient documentation

## 2022-12-11 DIAGNOSIS — I4819 Other persistent atrial fibrillation: Secondary | ICD-10-CM | POA: Insufficient documentation

## 2022-12-11 DIAGNOSIS — I2510 Atherosclerotic heart disease of native coronary artery without angina pectoris: Secondary | ICD-10-CM

## 2022-12-11 DIAGNOSIS — Z79899 Other long term (current) drug therapy: Secondary | ICD-10-CM | POA: Diagnosis not present

## 2022-12-11 DIAGNOSIS — G8918 Other acute postprocedural pain: Secondary | ICD-10-CM | POA: Diagnosis not present

## 2022-12-11 DIAGNOSIS — Z85038 Personal history of other malignant neoplasm of large intestine: Secondary | ICD-10-CM | POA: Diagnosis not present

## 2022-12-11 DIAGNOSIS — I4891 Unspecified atrial fibrillation: Secondary | ICD-10-CM | POA: Diagnosis not present

## 2022-12-11 HISTORY — PX: TOTAL KNEE ARTHROPLASTY: SHX125

## 2022-12-11 SURGERY — ARTHROPLASTY, KNEE, TOTAL
Anesthesia: General | Site: Knee | Laterality: Right

## 2022-12-11 MED ORDER — PROPOFOL 10 MG/ML IV BOLUS
INTRAVENOUS | Status: DC | PRN
  Administered 2022-12-11: 170 mg via INTRAVENOUS
  Administered 2022-12-11: 20 mg via INTRAVENOUS

## 2022-12-11 MED ORDER — LIDOCAINE HCL (CARDIAC) PF 100 MG/5ML IV SOSY
PREFILLED_SYRINGE | INTRAVENOUS | Status: DC | PRN
  Administered 2022-12-11: 60 mg via INTRATRACHEAL

## 2022-12-11 MED ORDER — ATORVASTATIN CALCIUM 10 MG PO TABS
10.0000 mg | ORAL_TABLET | Freq: Every day | ORAL | Status: DC
  Administered 2022-12-12: 10 mg via ORAL
  Filled 2022-12-11: qty 1

## 2022-12-11 MED ORDER — SODIUM CHLORIDE (PF) 0.9 % IJ SOLN
INTRAMUSCULAR | Status: AC
  Filled 2022-12-11: qty 30

## 2022-12-11 MED ORDER — FENTANYL CITRATE (PF) 100 MCG/2ML IJ SOLN
INTRAMUSCULAR | Status: AC
  Filled 2022-12-11: qty 2

## 2022-12-11 MED ORDER — DEXAMETHASONE SODIUM PHOSPHATE 10 MG/ML IJ SOLN
8.0000 mg | Freq: Once | INTRAMUSCULAR | Status: AC
  Administered 2022-12-11: 8 mg via INTRAVENOUS

## 2022-12-11 MED ORDER — PHENOL 1.4 % MT LIQD: 1.0000 | OROMUCOSAL | Status: DC | PRN

## 2022-12-11 MED ORDER — CYCLOBENZAPRINE HCL 10 MG PO TABS: 10.0000 mg | ORAL_TABLET | Freq: Three times a day (TID) | ORAL | Status: DC | PRN

## 2022-12-11 MED ORDER — SODIUM CHLORIDE 0.9% FLUSH
10.0000 mL | Freq: Two times a day (BID) | INTRAVENOUS | Status: DC
  Administered 2022-12-11 – 2022-12-12 (×2): 10 mL via INTRAVENOUS

## 2022-12-11 MED ORDER — ONDANSETRON HCL 4 MG/2ML IJ SOLN
INTRAMUSCULAR | Status: DC | PRN
  Administered 2022-12-11: 4 mg via INTRAVENOUS

## 2022-12-11 MED ORDER — OXYCODONE HCL 5 MG/5ML PO SOLN: 5.0000 mg | Freq: Once | ORAL | Status: AC | PRN

## 2022-12-11 MED ORDER — DILTIAZEM HCL ER COATED BEADS 240 MG PO CP24
240.0000 mg | ORAL_CAPSULE | Freq: Every day | ORAL | Status: DC
  Administered 2022-12-12: 240 mg via ORAL
  Filled 2022-12-11: qty 1

## 2022-12-11 MED ORDER — PROPOFOL 10 MG/ML IV BOLUS
INTRAVENOUS | Status: AC
  Filled 2022-12-11: qty 20

## 2022-12-11 MED ORDER — METOCLOPRAMIDE HCL 5 MG/ML IJ SOLN: 5.0000 mg | Freq: Three times a day (TID) | INTRAMUSCULAR | Status: DC | PRN

## 2022-12-11 MED ORDER — HYDROMORPHONE HCL 1 MG/ML IJ SOLN
0.2500 mg | INTRAMUSCULAR | Status: DC | PRN
  Administered 2022-12-11: 0.5 mg via INTRAVENOUS
  Administered 2022-12-11 (×2): 0.25 mg via INTRAVENOUS

## 2022-12-11 MED ORDER — CEFAZOLIN SODIUM-DEXTROSE 2-4 GM/100ML-% IV SOLN
2.0000 g | Freq: Four times a day (QID) | INTRAVENOUS | Status: AC
  Administered 2022-12-11 (×2): 2 g via INTRAVENOUS
  Filled 2022-12-11 (×2): qty 100

## 2022-12-11 MED ORDER — ACETAMINOPHEN 10 MG/ML IV SOLN
INTRAVENOUS | Status: AC
  Filled 2022-12-11: qty 100

## 2022-12-11 MED ORDER — CHLORHEXIDINE GLUCONATE 0.12 % MT SOLN
15.0000 mL | Freq: Once | OROMUCOSAL | Status: AC
  Administered 2022-12-11: 15 mL via OROMUCOSAL

## 2022-12-11 MED ORDER — ACETAMINOPHEN 10 MG/ML IV SOLN
1000.0000 mg | Freq: Once | INTRAVENOUS | Status: AC
  Administered 2022-12-11: 1000 mg via INTRAVENOUS

## 2022-12-11 MED ORDER — OXYCODONE HCL 5 MG PO TABS
5.0000 mg | ORAL_TABLET | ORAL | Status: DC | PRN
  Administered 2022-12-12: 5 mg via ORAL
  Filled 2022-12-11: qty 2

## 2022-12-11 MED ORDER — LACTATED RINGERS IV SOLN: INTRAVENOUS | Status: DC

## 2022-12-11 MED ORDER — ONDANSETRON HCL 4 MG/2ML IJ SOLN: 4.0000 mg | Freq: Four times a day (QID) | INTRAMUSCULAR | Status: DC | PRN

## 2022-12-11 MED ORDER — FENTANYL CITRATE (PF) 100 MCG/2ML IJ SOLN
INTRAMUSCULAR | Status: DC | PRN
  Administered 2022-12-11 (×2): 50 ug via INTRAVENOUS

## 2022-12-11 MED ORDER — ACETAMINOPHEN 500 MG PO TABS
1000.0000 mg | ORAL_TABLET | Freq: Four times a day (QID) | ORAL | Status: DC
  Administered 2022-12-11 – 2022-12-12 (×4): 1000 mg via ORAL
  Filled 2022-12-11 (×4): qty 2

## 2022-12-11 MED ORDER — SODIUM CHLORIDE 0.9 % IR SOLN
Status: DC | PRN
  Administered 2022-12-11: 1000 mL

## 2022-12-11 MED ORDER — METOPROLOL SUCCINATE ER 50 MG PO TB24
100.0000 mg | ORAL_TABLET | Freq: Every day | ORAL | Status: DC
  Filled 2022-12-11: qty 2

## 2022-12-11 MED ORDER — DEXAMETHASONE SODIUM PHOSPHATE 10 MG/ML IJ SOLN
INTRAMUSCULAR | Status: AC
  Filled 2022-12-11: qty 1

## 2022-12-11 MED ORDER — FENTANYL CITRATE PF 50 MCG/ML IJ SOSY
25.0000 ug | PREFILLED_SYRINGE | INTRAMUSCULAR | Status: DC | PRN
Start: 2022-12-11 — End: 2022-12-11
  Administered 2022-12-11 (×3): 50 ug via INTRAVENOUS

## 2022-12-11 MED ORDER — ALUM & MAG HYDROXIDE-SIMETH 200-200-20 MG/5ML PO SUSP: 30.0000 mL | ORAL | Status: DC | PRN

## 2022-12-11 MED ORDER — ALLOPURINOL 100 MG PO TABS
100.0000 mg | ORAL_TABLET | Freq: Every day | ORAL | Status: DC
  Administered 2022-12-12: 100 mg via ORAL
  Filled 2022-12-11: qty 1

## 2022-12-11 MED ORDER — PHENYLEPHRINE HCL-NACL 20-0.9 MG/250ML-% IV SOLN
INTRAVENOUS | Status: AC
  Filled 2022-12-11: qty 250

## 2022-12-11 MED ORDER — KETOROLAC TROMETHAMINE 30 MG/ML IJ SOLN
INTRAMUSCULAR | Status: AC
  Filled 2022-12-11: qty 1

## 2022-12-11 MED ORDER — GLYCOPYRROLATE 0.2 MG/ML IJ SOLN
INTRAMUSCULAR | Status: DC | PRN
  Administered 2022-12-11: .2 mg via INTRAVENOUS

## 2022-12-11 MED ORDER — SENNA 8.6 MG PO TABS
2.0000 | ORAL_TABLET | Freq: Every day | ORAL | Status: DC
  Administered 2022-12-11: 17.2 mg via ORAL
  Filled 2022-12-11: qty 2

## 2022-12-11 MED ORDER — FUROSEMIDE 20 MG PO TABS
20.0000 mg | ORAL_TABLET | Freq: Every day | ORAL | Status: DC
  Administered 2022-12-12: 20 mg via ORAL
  Filled 2022-12-11: qty 1

## 2022-12-11 MED ORDER — TRANEXAMIC ACID-NACL 1000-0.7 MG/100ML-% IV SOLN
1000.0000 mg | Freq: Once | INTRAVENOUS | Status: AC
  Administered 2022-12-11: 1000 mg via INTRAVENOUS
  Filled 2022-12-11: qty 100

## 2022-12-11 MED ORDER — METOCLOPRAMIDE HCL 5 MG PO TABS: 5.0000 mg | ORAL_TABLET | Freq: Three times a day (TID) | ORAL | Status: DC | PRN

## 2022-12-11 MED ORDER — PROPOFOL 1000 MG/100ML IV EMUL
INTRAVENOUS | Status: AC
  Filled 2022-12-11: qty 100

## 2022-12-11 MED ORDER — HYDROMORPHONE HCL 1 MG/ML IJ SOLN
0.5000 mg | INTRAMUSCULAR | Status: DC | PRN
  Administered 2022-12-11: 1 mg via INTRAVENOUS
  Filled 2022-12-11: qty 1

## 2022-12-11 MED ORDER — APIXABAN 5 MG PO TABS
5.0000 mg | ORAL_TABLET | Freq: Two times a day (BID) | ORAL | Status: DC
  Administered 2022-12-12: 5 mg via ORAL
  Filled 2022-12-11: qty 1

## 2022-12-11 MED ORDER — STERILE WATER FOR IRRIGATION IR SOLN
Status: DC | PRN
  Administered 2022-12-11: 1000 mL

## 2022-12-11 MED ORDER — FENTANYL CITRATE PF 50 MCG/ML IJ SOSY
PREFILLED_SYRINGE | INTRAMUSCULAR | Status: AC
  Filled 2022-12-11: qty 1

## 2022-12-11 MED ORDER — ORAL CARE MOUTH RINSE: 15.0000 mL | Freq: Once | OROMUCOSAL | Status: AC

## 2022-12-11 MED ORDER — LIDOCAINE HCL (PF) 2 % IJ SOLN
INTRAMUSCULAR | Status: AC
  Filled 2022-12-11: qty 5

## 2022-12-11 MED ORDER — BUPIVACAINE-EPINEPHRINE 0.25% -1:200000 IJ SOLN
INTRAMUSCULAR | Status: AC
  Filled 2022-12-11: qty 1

## 2022-12-11 MED ORDER — POLYETHYLENE GLYCOL 3350 17 G PO PACK
17.0000 g | PACK | Freq: Two times a day (BID) | ORAL | Status: DC
  Administered 2022-12-11 – 2022-12-12 (×2): 17 g via ORAL
  Filled 2022-12-11 (×2): qty 1

## 2022-12-11 MED ORDER — FENTANYL CITRATE PF 50 MCG/ML IJ SOSY
50.0000 ug | PREFILLED_SYRINGE | INTRAMUSCULAR | Status: DC
  Administered 2022-12-11: 50 ug via INTRAVENOUS
  Filled 2022-12-11: qty 2

## 2022-12-11 MED ORDER — METHOCARBAMOL 500 MG PO TABS
ORAL_TABLET | ORAL | Status: AC
  Filled 2022-12-11: qty 1

## 2022-12-11 MED ORDER — GLYCOPYRROLATE 0.2 MG/ML IJ SOLN
INTRAMUSCULAR | Status: AC
  Filled 2022-12-11: qty 1

## 2022-12-11 MED ORDER — OXYCODONE HCL 5 MG PO TABS
ORAL_TABLET | ORAL | Status: AC
  Filled 2022-12-11: qty 1

## 2022-12-11 MED ORDER — ONDANSETRON HCL 4 MG PO TABS: 4.0000 mg | ORAL_TABLET | Freq: Four times a day (QID) | ORAL | Status: DC | PRN

## 2022-12-11 MED ORDER — ONDANSETRON HCL 4 MG/2ML IJ SOLN
INTRAMUSCULAR | Status: AC
  Filled 2022-12-11: qty 2

## 2022-12-11 MED ORDER — CEFAZOLIN SODIUM-DEXTROSE 2-4 GM/100ML-% IV SOLN
2.0000 g | INTRAVENOUS | Status: AC
  Administered 2022-12-11: 2 g via INTRAVENOUS
  Filled 2022-12-11: qty 100

## 2022-12-11 MED ORDER — DEXAMETHASONE SODIUM PHOSPHATE 10 MG/ML IJ SOLN
10.0000 mg | Freq: Once | INTRAMUSCULAR | Status: AC
  Administered 2022-12-12: 10 mg via INTRAVENOUS
  Filled 2022-12-11: qty 1

## 2022-12-11 MED ORDER — TRANEXAMIC ACID-NACL 1000-0.7 MG/100ML-% IV SOLN
1000.0000 mg | INTRAVENOUS | Status: AC
  Administered 2022-12-11: 1000 mg via INTRAVENOUS
  Filled 2022-12-11: qty 100

## 2022-12-11 MED ORDER — POVIDONE-IODINE 10 % EX SWAB: 2.0000 | Freq: Once | CUTANEOUS | Status: DC

## 2022-12-11 MED ORDER — ROPIVACAINE HCL 5 MG/ML IJ SOLN
INTRAMUSCULAR | Status: DC | PRN
  Administered 2022-12-11: 25 mL via PERINEURAL

## 2022-12-11 MED ORDER — BISACODYL 10 MG RE SUPP: 10.0000 mg | Freq: Every day | RECTAL | Status: DC | PRN

## 2022-12-11 MED ORDER — FENTANYL CITRATE PF 50 MCG/ML IJ SOSY
PREFILLED_SYRINGE | INTRAMUSCULAR | Status: AC
  Filled 2022-12-11: qty 2

## 2022-12-11 MED ORDER — OXYCODONE HCL 5 MG PO TABS
10.0000 mg | ORAL_TABLET | ORAL | Status: DC | PRN
  Administered 2022-12-11: 10 mg via ORAL
  Filled 2022-12-11: qty 2

## 2022-12-11 MED ORDER — HYDROMORPHONE HCL 1 MG/ML IJ SOLN
INTRAMUSCULAR | Status: AC
  Filled 2022-12-11: qty 1

## 2022-12-11 MED ORDER — OXYCODONE HCL 5 MG PO TABS
5.0000 mg | ORAL_TABLET | Freq: Once | ORAL | Status: AC | PRN
  Administered 2022-12-11: 5 mg via ORAL

## 2022-12-11 MED ORDER — 0.9 % SODIUM CHLORIDE (POUR BTL) OPTIME
TOPICAL | Status: DC | PRN
  Administered 2022-12-11: 1000 mL

## 2022-12-11 MED ORDER — MENTHOL 3 MG MT LOZG: 1.0000 | LOZENGE | OROMUCOSAL | Status: DC | PRN

## 2022-12-11 MED ORDER — SODIUM CHLORIDE (PF) 0.9 % IJ SOLN
INTRAMUSCULAR | Status: DC | PRN
  Administered 2022-12-11: 61 mL

## 2022-12-11 MED ORDER — METHOCARBAMOL 500 MG PO TABS
500.0000 mg | ORAL_TABLET | Freq: Once | ORAL | Status: AC
  Administered 2022-12-11: 500 mg via ORAL

## 2022-12-11 MED ORDER — DIPHENHYDRAMINE HCL 12.5 MG/5ML PO ELIX: 12.5000 mg | ORAL_SOLUTION | ORAL | Status: DC | PRN

## 2022-12-11 SURGICAL SUPPLY — 56 items
ADH SKN CLS APL DERMABOND .7 (GAUZE/BANDAGES/DRESSINGS) ×1
ATTUNE MED ANAT PAT 41 KNEE (Knees) IMPLANT
ATTUNE PS FEM RT SZ 8 CEM KNEE (Femur) IMPLANT
ATTUNE PSRP INSR SZ8 7 KNEE (Insert) IMPLANT
BAG COUNTER SPONGE SURGICOUNT (BAG) IMPLANT
BAG SPEC THK2 15X12 ZIP CLS (MISCELLANEOUS)
BAG SPNG CNTER NS LX DISP (BAG)
BAG ZIPLOCK 12X15 (MISCELLANEOUS) IMPLANT
BASE TIBIAL ROT PLAT SZ 7 KNEE (Knees) IMPLANT
BLADE SAW SGTL 13.0X1.19X90.0M (BLADE) ×1 IMPLANT
BNDG CMPR 6 X 5 YARDS HK CLSR (GAUZE/BANDAGES/DRESSINGS) ×1
BNDG ELASTIC 6INX 5YD STR LF (GAUZE/BANDAGES/DRESSINGS) ×1 IMPLANT
BOWL SMART MIX CTS (DISPOSABLE) ×1 IMPLANT
BSPLAT TIB 7 CMNT ROT PLAT STR (Knees) ×1 IMPLANT
CEMENT HV SMART SET (Cement) ×2 IMPLANT
COOLER ICEMAN CLASSIC (MISCELLANEOUS) IMPLANT
COVER SURGICAL LIGHT HANDLE (MISCELLANEOUS) ×1 IMPLANT
CUFF TOURN SGL QUICK 34 (TOURNIQUET CUFF) ×1
CUFF TRNQT CYL 34X4.125X (TOURNIQUET CUFF) ×1 IMPLANT
DERMABOND ADVANCED .7 DNX12 (GAUZE/BANDAGES/DRESSINGS) ×1 IMPLANT
DRAPE U-SHAPE 47X51 STRL (DRAPES) ×1 IMPLANT
DRESSING AQUACEL AG SP 3.5X10 (GAUZE/BANDAGES/DRESSINGS) ×1 IMPLANT
DRSG AQUACEL AG SP 3.5X10 (GAUZE/BANDAGES/DRESSINGS) ×1
DURAPREP 26ML APPLICATOR (WOUND CARE) ×2 IMPLANT
ELECT REM PT RETURN 15FT ADLT (MISCELLANEOUS) ×1 IMPLANT
GLOVE BIO SURGEON STRL SZ 6 (GLOVE) ×1 IMPLANT
GLOVE BIOGEL PI IND STRL 6.5 (GLOVE) ×1 IMPLANT
GLOVE BIOGEL PI IND STRL 7.5 (GLOVE) ×1 IMPLANT
GLOVE ORTHO TXT STRL SZ7.5 (GLOVE) ×2 IMPLANT
GOWN STRL REUS W/ TWL LRG LVL3 (GOWN DISPOSABLE) ×2 IMPLANT
GOWN STRL REUS W/TWL LRG LVL3 (GOWN DISPOSABLE) ×2
HANDPIECE INTERPULSE COAX TIP (DISPOSABLE) ×1
HOLDER FOLEY CATH W/STRAP (MISCELLANEOUS) IMPLANT
KIT TURNOVER KIT A (KITS) IMPLANT
MANIFOLD NEPTUNE II (INSTRUMENTS) ×1 IMPLANT
NDL SAFETY ECLIPSE 18X1.5 (NEEDLE) IMPLANT
NS IRRIG 1000ML POUR BTL (IV SOLUTION) ×1 IMPLANT
PACK TOTAL KNEE CUSTOM (KITS) ×1 IMPLANT
PAD COLD SHLDR WRAP-ON (PAD) IMPLANT
PIN FIX SIGMA LCS THRD HI (PIN) IMPLANT
PROTECTOR NERVE ULNAR (MISCELLANEOUS) ×1 IMPLANT
SET HNDPC FAN SPRY TIP SCT (DISPOSABLE) ×1 IMPLANT
SET PAD KNEE POSITIONER (MISCELLANEOUS) ×1 IMPLANT
SPIKE FLUID TRANSFER (MISCELLANEOUS) ×2 IMPLANT
SUT MNCRL AB 4-0 PS2 18 (SUTURE) ×1 IMPLANT
SUT STRATAFIX PDS+ 0 24IN (SUTURE) ×1 IMPLANT
SUT VIC AB 1 CT1 36 (SUTURE) ×1 IMPLANT
SUT VIC AB 2-0 CT1 27 (SUTURE) ×3
SUT VIC AB 2-0 CT1 TAPERPNT 27 (SUTURE) ×2 IMPLANT
SYR 3ML LL SCALE MARK (SYRINGE) ×1 IMPLANT
TIBIAL BASE ROT PLAT SZ 7 KNEE (Knees) ×1 IMPLANT
TOWEL GREEN STERILE FF (TOWEL DISPOSABLE) ×1 IMPLANT
TRAY FOLEY MTR SLVR 16FR STAT (SET/KITS/TRAYS/PACK) ×1 IMPLANT
TUBE SUCTION HIGH CAP CLEAR NV (SUCTIONS) ×1 IMPLANT
WATER STERILE IRR 1000ML POUR (IV SOLUTION) ×2 IMPLANT
WRAP KNEE MAXI GEL POST OP (GAUZE/BANDAGES/DRESSINGS) ×1 IMPLANT

## 2022-12-11 NOTE — Interval H&P Note (Signed)
History and Physical Interval Note:  12/11/2022 8:45 AM  [name] Woods  has presented today for surgery, with the diagnosis of Right knee osteoarthritis.  The various methods of treatment have been discussed with the patient and family. After consideration of risks, benefits and other options for treatment, the patient has consented to  Procedure(s): TOTAL KNEE ARTHROPLASTY (Right) as a surgical intervention.  The patient's history has been reviewed, patient examined, no change in status, stable for surgery.  I have reviewed the patient's chart and labs.  Questions were answered to the patient's satisfaction.     [name]

## 2022-12-11 NOTE — Transfer of Care (Signed)
Immediate Anesthesia Transfer of Care Note  Patient: RAMZEY FEDAK  Procedure(s) Performed: TOTAL KNEE ARTHROPLASTY (Right: Knee)  Patient Location: PACU  Anesthesia Type:General  Level of Consciousness: awake and alert   Airway & Oxygen Therapy: Patient Spontanous Breathing and Patient connected to face mask oxygen  Post-op Assessment: Report given to RN, Post -op Vital signs reviewed and stable, and Patient moving all extremities  Post vital signs: Reviewed and stable  Last Vitals:  Vitals Value Taken Time  BP 150/80 12/11/22 1157  Temp    Pulse 63 12/11/22 1159  Resp 17 12/11/22 1159  SpO2 97 % 12/11/22 1159  Vitals shown include unfiled device data.  Last Pain:  Vitals:   12/11/22 0855  TempSrc:   PainSc: 0-No pain      Patients Stated Pain Goal: 4 (12/11/22 0813)  Complications: No notable events documented.

## 2022-12-11 NOTE — Op Note (Addendum)
NAME:  [name] Woods                      MEDICAL RECORD NO.:  [id number]                             FACILITY:  [facility]      PHYSICIAN:  Madlyn Frankel. Charlann Boxer, M.D.  DATE OF BIRTH:  Oct 19, 1940      DATE OF PROCEDURE:  12/11/2022                                     OPERATIVE REPORT         PREOPERATIVE DIAGNOSIS:  Right knee osteoarthritis.      POSTOPERATIVE DIAGNOSIS:  Right knee osteoarthritis.      FINDINGS:  The patient was noted to have complete loss of cartilage and   bone-on-bone arthritis with associated osteophytes in the medial and patellofemoral compartments of   the knee with a significant synovitis and associated effusion.  The patient had failed months of conservative treatment including medications, injection therapy, activity modification.     PROCEDURE:  Right total knee replacement.      COMPONENTS USED:  DePuy Attune rotating platform PS knee   system, a size 8 femur, 7 tibia, size 7 mm PS AOX insert, and 41 anatomic patellar   button.      SURGEON:  Madlyn Frankel. Charlann Boxer, M.D.      ASSISTANT:  Rosalene Billings, PA-C.      ANESTHESIA:  General and Regional.      SPECIMENS:  None.      COMPLICATION:  None.      DRAINS:  None.  EBL: <200 cc      TOURNIQUET TIME:  43 min at 225 mmHg     The patient was stable to the recovery room.      INDICATION FOR PROCEDURE:  [name] Woods is a 82 y.o. male patient of   mine.  The patient had been seen, evaluated, and treated for months conservatively in the   office with medication, activity modification, and injections.  The patient had   radiographic changes of bone-on-bone arthritis with endplate sclerosis and osteophytes noted.  Based on the radiographic changes and failed conservative measures, the patient   decided to proceed with definitive treatment, total knee replacement.  Risks of infection, DVT, component failure, need for revision surgery, neurovascular injury were reviewed in the office setting.  The postop  course was reviewed stressing the efforts to maximize post-operative satisfaction and function.  Consent was obtained for benefit of pain   relief.      PROCEDURE IN DETAIL:  The patient was brought to the operative theater.   Once adequate anesthesia, preoperative antibiotics, 2 gm of Ancef,1 gm of Tranexamic Acid, and 10 mg of Decadron administered, the patient was positioned supine with a right thigh tourniquet placed.  The  right lower extremity was prepped and draped in sterile fashion.  A time-   out was performed identifying the patient, planned procedure, and the appropriate extremity.      The right lower extremity was placed in the [facility] leg holder.  The leg was   exsanguinated, tourniquet elevated to 225 mmHg.  A midline incision was   made followed by median parapatellar arthrotomy.  Following initial   exposure, attention was first  directed to the patella.  Precut   measurement was noted to be 27-28 mm.  I resected down to 15 mm and used a   41 anatomic patellar button to restore patellar height as well as cover the cut surface.      The lug holes were drilled and a metal shim was placed to protect the   patella from retractors and saw blade during the procedure.      At this point, attention was now directed to the femur.  The femoral   canal was opened with a drill, irrigated to try to prevent fat emboli.  An   intramedullary rod was passed at 5 degrees valgus, 11 mm of bone was   resected off the distal femur.  Following this resection, the tibia was   subluxated anteriorly.  Using the extramedullary guide, 2 mm of bone was resected off   the proximal medial tibia.  We confirmed the gap would be   stable medially and laterally with a size 5 spacer block as well as confirmed that the tibial cut was perpendicular in the coronal plane, checking with an alignment rod.      Once this was done, I sized the femur to be a size 8 in the anterior-   posterior dimension, chose a  standard component based on medial and   lateral dimension.  The size 8 rotation block was then pinned in   position anterior referenced using the C-clamp to set rotation.  The   anterior, posterior, and  chamfer cuts were made without difficulty nor   notching making certain that I was along the anterior cortex to help   with flexion gap stability.      The final box cut was made off the lateral aspect of distal femur. I then used the laminar spreaders to open the posterior aspect of the knee to excise as much posterior osteophyte and loose bone as I could removed from the back of the knee safely/.     At this point, the tibia was sized to be a size 7.  The size 7 tray was   then pinned in position through the medial third of the tubercle,   drilled, and keel punched.  Trial reduction was now carried with a 8 femur,  7 tibia, a size 7 mm PS insert, and the 41 anatomic patella botton.  The knee was brought to full extension with good flexion stability with the patella   tracking through the trochlea without application of pressure.  Given   all these findings the trial components removed.  Final components were   opened and cement was mixed.  The knee was irrigated with normal saline solution and pulse lavage.  The synovial lining was   then injected with 30 cc of 0.25% Marcaine with epinephrine, 1 cc of Toradol and 30 cc of NS for a total of 61 cc.     Final implants were then cemented onto cleaned and dried cut surfaces of bone with the knee brought to extension with a size 7 mm PS trial insert.      Once the cement had fully cured, excess cement was removed   throughout the knee.  I confirmed that I was satisfied with the range of   motion and stability, and the final size 7 mm PS AOX insert was chosen.  It was   placed into the knee.      The tourniquet had been let down at 43 minutes.  No significant   hemostasis was required.  The extensor mechanism was then reapproximated using #1  Vicryl and #1 Stratafix sutures with the knee   in flexion.  The   remaining wound was closed with 2-0 Vicryl and running 4-0 Monocryl.   The knee was cleaned, dried, dressed sterilely using Dermabond and   Aquacel dressing.  The patient was then   brought to recovery room in stable condition, tolerating the procedure   well.   Please note that Physician Assistant, Rosalene Billings, PA-C was present for the entirety of the case, and was utilized for pre-operative positioning, peri-operative retractor management, general facilitation of the procedure and for primary wound closure at the end of the case.              Madlyn Frankel Charlann Boxer, M.D.    12/11/2022 8:46 AM

## 2022-12-11 NOTE — Evaluation (Signed)
Physical Therapy Evaluation Patient Details Name: [name] Woods MRN: [id number] DOB: 05-Sep-1940 Today's Date: 12/11/2022  History of Present Illness  Pt is 82 yo male s/p R TKA on 12/11/22.  Pt with hx including but not limited to OA, OSA, afib, HLD, hyperglycemia, gout, GERD, HTN, CAD, ankylosing spondylitis  Clinical Impression  Pt is s/p TKA resulting in the deficits listed below (see PT Problem List). At baseline, pt active and independent.  He has DME and home support.  Today, pt required mod A to stand but then was able to ambulate 20' with RW and CGA. Pt had good quad activation and pain control. ROM was limited - pt with significant genu varus prior to sx. Pt will benefit from acute skilled PT to increase their independence and safety with mobility to allow discharge.          If plan is discharge home, recommend the following: A little help with walking and/or transfers;A little help with bathing/dressing/bathroom;Assistance with cooking/housework;Help with stairs or ramp for entrance   Can travel by private vehicle        Equipment Recommendations None recommended by PT  Recommendations for Other Services       Functional Status Assessment Patient has had a recent decline in their functional status and demonstrates the ability to make significant improvements in function in a reasonable and predictable amount of time.     Precautions / Restrictions Precautions Precautions: Fall;Knee Restrictions Weight Bearing Restrictions: Yes RLE Weight Bearing: Weight bearing as tolerated      Mobility  Bed Mobility Overal bed mobility: Needs Assistance Bed Mobility: Supine to Sit     Supine to sit: Min assist     General bed mobility comments: assist for R LE    Transfers Overall transfer level: Needs assistance Equipment used: Rolling walker (2 wheels) Transfers: Sit to/from Stand Sit to Stand: Mod assist           General transfer comment: increased time,  cues for hand placement and R LE management    Ambulation/Gait Ambulation/Gait assistance: CGA Gait Distance (Feet): 20 Feet Assistive device: Rolling walker (2 wheels) Gait Pattern/deviations: Step-to pattern, Decreased stride length, Decreased weight shift to right Gait velocity: decreased     General Gait Details: cues for sequencing, hand placement, and RW use  Stairs            Wheelchair Mobility     Tilt Bed    Modified Rankin (Stroke Patients Only)       Balance Overall balance assessment: Needs assistance Sitting-balance support: No upper extremity supported Sitting balance-Leahy Scale: Good     Standing balance support: Bilateral upper extremity supported, Reliant on assistive device for balance Standing balance-Leahy Scale: Poor Standing balance comment: steady with RW                             Pertinent Vitals/Pain Pain Assessment Pain Assessment: No/denies pain    Home Living Family/patient expects to be discharged to:: Private residence Living Arrangements: Spouse/significant other Available Help at Discharge: Family;Available 24 hours/day Type of Home: House Home Access: Stairs to enter Entrance Stairs-Rails: Doctor, general practice of Steps: 5 at backdoor, 10 frontdoor   Home Layout: One level Home Equipment: Cane - single point;Rolling Walker (2 wheels);Grab bars - tub/shower      Prior Function Prior Level of Function : Independent/Modified Independent;Driving             Mobility  Comments: pt reports ind without AD, denies falls, still does yardwork ADLs Comments: pt reports ind with self care and splits household chores with spouse     Extremity/Trunk Assessment   Upper Extremity Assessment Upper Extremity Assessment: Overall WFL for tasks assessed    Lower Extremity Assessment Lower Extremity Assessment: LLE deficits/detail;RLE deficits/detail RLE Deficits / Details: Expected post op changes;  ROM: knee 10 to 70 degrees; MMT: ankle 5/5, knee 3/5, hip 3/5; pt reports significant genu varus prior to sx RLE Sensation: WNL LLE Deficits / Details: ROM: genu varus otherwise WFL; MMT: 5/5 throughout LLE Sensation: WNL    Cervical / Trunk Assessment Cervical / Trunk Assessment: Normal  Communication      Cognition Arousal: Alert Behavior During Therapy: WFL for tasks assessed/performed Overall Cognitive Status: Within Functional Limits for tasks assessed                                          General Comments      Exercises     Assessment/Plan    PT Assessment Patient needs continued PT services  PT Problem List Decreased strength;Pain;Decreased range of motion;Decreased activity tolerance;Decreased balance;Decreased mobility;Decreased knowledge of use of DME       PT Treatment Interventions DME instruction;Therapeutic exercise;Gait training;Stair training;Functional mobility training;Therapeutic activities;Patient/family education;Modalities;Balance training    PT Goals (Current goals can be found in the Care Plan section)  Acute Rehab PT Goals Patient Stated Goal: return home; decrease pain PT Goal Formulation: With patient Time For Goal Achievement: 12/25/22 Potential to Achieve Goals: Good    Frequency 7X/week     Co-evaluation               AM-PAC PT "6 Clicks" Mobility  Outcome Measure Help needed turning from your back to your side while in a flat bed without using bedrails?: A Little Help needed moving from lying on your back to sitting on the side of a flat bed without using bedrails?: A Little Help needed moving to and from a bed to a chair (including a wheelchair)?: A Little Help needed standing up from a chair using your arms (e.g., wheelchair or bedside chair)?: A Little Help needed to walk in hospital room?: A Little Help needed climbing 3-5 steps with a railing? : A Lot 6 Click Score: 17    End of Session Equipment  Utilized During Treatment: Gait belt Activity Tolerance: Patient tolerated treatment well Patient left: in bed;with call bell/phone within reach;with family/visitor present (Pt wanting to sit EOB, pt steady, aware not to stand on his own, notified RN) Nurse Communication: Mobility status PT Visit Diagnosis: Other abnormalities of gait and mobility (R26.89);Muscle weakness (generalized) (M62.81)    Time: 4259-5638 PT Time Calculation (min) (ACUTE ONLY): 24 min   Charges:   PT Evaluation $PT Eval Low Complexity: 1 Low PT Treatments $Gait Training: 8-22 mins PT General Charges $$ ACUTE PT VISIT: 1 Visit         Anise Salvo, PT Acute Rehab [facility] [phone]   Rayetta Humphrey 12/11/2022, 4:55 PM

## 2022-12-11 NOTE — Care Plan (Signed)
Ortho Bundle Case Management Note  Patient Details  Name: [name] Woods MRN: [id number] Date of Birth: Jun 05, 1940  R TKA on 12-11-22 DCP:  Home with wife DME:  No needs, has a RW PT:  [facility] on 12-17-22                  DME Arranged:  N/A DME Agency:  NA  HH Arranged:  NA HH Agency:  NA  Additional Comments: Please contact me with any questions of if this plan should need to change.  Despina Pole, CCM.  [facility], [phone]. 12/11/2022, 9:25 AM

## 2022-12-11 NOTE — Plan of Care (Signed)
  Problem: Clinical Measurements: Goal: Ability to maintain clinical measurements within normal limits will improve Outcome: Progressing   Problem: Activity: Goal: Risk for activity intolerance will decrease Outcome: Progressing   Problem: Safety: Goal: Ability to remain free from injury will improve Outcome: Progressing   Problem: Pain Management: Goal: General experience of comfort will improve Outcome: Progressing

## 2022-12-11 NOTE — Discharge Instructions (Signed)

## 2022-12-11 NOTE — Anesthesia Procedure Notes (Signed)
Anesthesia Regional Block: Adductor canal block   Pre-Anesthetic Checklist: , timeout performed,  Correct Patient, Correct Site, Correct Laterality,  Correct Procedure, Correct Position, site marked,  Risks and benefits discussed,  Surgical consent,  Pre-op evaluation,  At surgeon's request and post-op pain management  Laterality: Right  Prep: chloraprep       Needles:  Injection technique: Single-shot  Needle Type: Echogenic Needle     Needle Length: 9cm  Needle Gauge: 21     Additional Needles:   Narrative:  Start time: 12/11/2022 8:52 AM End time: 12/11/2022 8:57 AM Injection made incrementally with aspirations every 5 mL.  Performed by: Personally  Anesthesiologist: Achille Rich, MD  Additional Notes: Pt tolerated the procedure well.

## 2022-12-11 NOTE — Anesthesia Postprocedure Evaluation (Signed)
Anesthesia Post Note  Patient: [name] Woods  Procedure(s) Performed: TOTAL KNEE ARTHROPLASTY (Right: Knee)     Patient location during evaluation: PACU Anesthesia Type: General and Regional Level of consciousness: awake and alert Pain management: pain level controlled Vital Signs Assessment: post-procedure vital signs reviewed and stable Respiratory status: spontaneous breathing, nonlabored ventilation, respiratory function stable and patient connected to nasal cannula oxygen Cardiovascular status: blood pressure returned to baseline and stable Postop Assessment: no apparent nausea or vomiting Anesthetic complications: no   No notable events documented.  Last Vitals:  Vitals:   12/11/22 1445 12/11/22 1511  BP: 139/70 (!) 143/71  Pulse: 65 68  Resp: 16 16  Temp:  (!) 36.3 C  SpO2: 100% 96%    Last Pain:  Vitals:   12/11/22 1511  TempSrc: Oral  PainSc:                  Antonetta Clanton S

## 2022-12-12 ENCOUNTER — Encounter (HOSPITAL_COMMUNITY): Payer: Self-pay | Admitting: Orthopedic Surgery

## 2022-12-12 ENCOUNTER — Other Ambulatory Visit (HOSPITAL_COMMUNITY): Payer: Self-pay

## 2022-12-12 DIAGNOSIS — I4819 Other persistent atrial fibrillation: Secondary | ICD-10-CM | POA: Diagnosis not present

## 2022-12-12 DIAGNOSIS — Z79899 Other long term (current) drug therapy: Secondary | ICD-10-CM | POA: Diagnosis not present

## 2022-12-12 DIAGNOSIS — I10 Essential (primary) hypertension: Secondary | ICD-10-CM | POA: Diagnosis not present

## 2022-12-12 DIAGNOSIS — I2510 Atherosclerotic heart disease of native coronary artery without angina pectoris: Secondary | ICD-10-CM | POA: Diagnosis not present

## 2022-12-12 DIAGNOSIS — Z85038 Personal history of other malignant neoplasm of large intestine: Secondary | ICD-10-CM | POA: Diagnosis not present

## 2022-12-12 DIAGNOSIS — M1711 Unilateral primary osteoarthritis, right knee: Secondary | ICD-10-CM | POA: Diagnosis not present

## 2022-12-12 DIAGNOSIS — Z7901 Long term (current) use of anticoagulants: Secondary | ICD-10-CM | POA: Diagnosis not present

## 2022-12-12 LAB — CBC
HCT: 34.3 % — ABNORMAL LOW (ref 39.0–52.0)
Hemoglobin: 11.3 g/dL — ABNORMAL LOW (ref 13.0–17.0)
MCH: 30.6 pg (ref 26.0–34.0)
MCHC: 32.9 g/dL (ref 30.0–36.0)
MCV: 93 fL (ref 80.0–100.0)
Platelets: 160 10*3/uL (ref 150–400)
RBC: 3.69 MIL/uL — ABNORMAL LOW (ref 4.22–5.81)
RDW: 13.4 % (ref 11.5–15.5)
WBC: 15.2 10*3/uL — ABNORMAL HIGH (ref 4.0–10.5)
nRBC: 0 % (ref 0.0–0.2)

## 2022-12-12 LAB — BASIC METABOLIC PANEL
Anion gap: 9 (ref 5–15)
BUN: 23 mg/dL (ref 8–23)
CO2: 27 mmol/L (ref 22–32)
Calcium: 8.4 mg/dL — ABNORMAL LOW (ref 8.9–10.3)
Chloride: 97 mmol/L — ABNORMAL LOW (ref 98–111)
Creatinine, Ser: 1.64 mg/dL — ABNORMAL HIGH (ref 0.61–1.24)
GFR, Estimated: 42 mL/min — ABNORMAL LOW (ref >60)
Glucose, Bld: 183 mg/dL — ABNORMAL HIGH (ref 70–99)
Potassium: 4 mmol/L (ref 3.5–5.1)
Sodium: 133 mmol/L — ABNORMAL LOW (ref 135–145)

## 2022-12-12 MED ORDER — SENNA 8.6 MG PO TABS
2.0000 | ORAL_TABLET | Freq: Every day | ORAL | 0 refills | Status: AC
  Filled 2022-12-12: qty 100, 50d supply, fill #0

## 2022-12-12 MED ORDER — TIZANIDINE HCL 4 MG PO TABS
2.0000 mg | ORAL_TABLET | Freq: Three times a day (TID) | ORAL | Status: DC | PRN
Start: 2022-12-12 — End: 2022-12-12

## 2022-12-12 MED ORDER — POLYETHYLENE GLYCOL 3350 17 G PO PACK
17.0000 g | PACK | Freq: Two times a day (BID) | ORAL | 0 refills | Status: AC
  Filled 2022-12-12: qty 14, 7d supply, fill #0

## 2022-12-12 MED ORDER — TIZANIDINE HCL 2 MG PO TABS
2.0000 mg | ORAL_TABLET | Freq: Three times a day (TID) | ORAL | 2 refills | Status: AC | PRN
  Filled 2022-12-12: qty 30, 10d supply, fill #0
  Filled 2023-01-24: qty 30, 10d supply, fill #1
  Filled 2023-02-07: qty 30, 10d supply, fill #2

## 2022-12-12 MED ORDER — OXYCODONE HCL 10 MG PO TABS
10.0000 mg | ORAL_TABLET | ORAL | 0 refills | Status: DC | PRN
  Filled 2022-12-12: qty 42, 7d supply, fill #0

## 2022-12-12 NOTE — Progress Notes (Signed)
Physical Therapy Treatment Patient Details Name: [name] Woods MRN: [id number] DOB: [birth year] Today's Date: 12/12/2022   History of Present Illness Pt is 82 yo male s/p R TKA on 12/11/22.  Pt with hx including but not limited to OA, OSA, afib, HLD, hyperglycemia, gout, GERD, HTN, CAD, ankylosing spondylitis    PT Comments  Pt is POD # 1 and is progressing well.  He has good pain control, ROM, and quad activation.  Pt with good understanding of HEP and eager to return home.  He was able to ambulate 44' and performed stairs similar to home set up.  Pt demonstrates safe gait & transfers in order to return home from PT perspective once discharged by MD.  While in hospital, will continue to benefit from PT for skilled therapy to advance mobility and exercises.       If plan is discharge home, recommend the following: A little help with walking and/or transfers;A little help with bathing/dressing/bathroom;Assistance with cooking/housework;Help with stairs or ramp for entrance   Can travel by private vehicle        Equipment Recommendations  None recommended by PT    Recommendations for Other Services       Precautions / Restrictions Precautions Precautions: Fall;Knee Restrictions RLE Weight Bearing: Weight bearing as tolerated     Mobility  Bed Mobility Overal bed mobility: Needs Assistance Bed Mobility: Supine to Sit     Supine to sit: Supervision          Transfers Overall transfer level: Needs assistance Equipment used: Rolling walker (2 wheels) Transfers: Sit to/from Stand Sit to Stand: Contact guard assist           General transfer comment: cues for hand placement with first stand, good carry over to second stand    Ambulation/Gait Ambulation/Gait assistance: Contact guard assist Gait Distance (Feet): 80 Feet Assistive device: Rolling walker (2 wheels) Gait Pattern/deviations: Step-through pattern, Decreased stride length, Decreased weight shift to  right Gait velocity: decreased     General Gait Details: slight decrease in weight shift to right; good RW use/proximity; genu varus on L   Stairs Stairs: Yes Stairs assistance: Contact guard assist Stair Management: Two rails, Step to pattern Number of Stairs: 5 General stair comments: educated on sequencing ; performed without difficulty   Wheelchair Mobility     Tilt Bed    Modified Rankin (Stroke Patients Only)       Balance Overall balance assessment: Needs assistance Sitting-balance support: No upper extremity supported Sitting balance-Leahy Scale: Good     Standing balance support: Reliant on assistive device for balance, No upper extremity supported, Bilateral upper extremity supported Standing balance-Leahy Scale: Fair Standing balance comment: RW to ambulate - steady; could static stand without support                            Cognition Arousal: Alert Behavior During Therapy: WFL for tasks assessed/performed Overall Cognitive Status: Within Functional Limits for tasks assessed                                          Exercises Total Joint Exercises Ankle Circles/Pumps: AROM, Both, 10 reps, Supine Quad Sets: AROM, Both, 10 reps, Supine Heel Slides: AROM, Right, 5 reps, Supine Hip ABduction/ADduction: AROM, Right, 5 reps, Supine Long Arc Quad: AROM, Right, 10 reps, Seated Knee Flexion:  AROM, Right, 10 reps, Seated Goniometric ROM: R knee 5 to 90 degrees Other Exercises Other Exercises: Educated on slow controlled ROM exercises, exercises to tolerance, and AAROM techniques if needed    General Comments General comments (skin integrity, edema, etc.): VSS,   Educated on safe ice use, no pivots, car transfers, resting with leg straight, and TED hose during day. Also, encouraged walking every 1-2 hours during day. Educated on HEP with focus on mobility the first weeks. Discussed doing exercises within pain control and if pain  increasing could decreased ROM, reps, and stop exercises as needed. Encouraged to perform quad sets and ankle pumps frequently for blood flow and to promote full knee extension.    Pertinent Vitals/Pain Pain Assessment Pain Assessment: 0-10 Pain Score: 4  Pain Location: R knee Pain Descriptors / Indicators: Discomfort, Sore Pain Intervention(s): Limited activity within patient's tolerance, Monitored during session, Premedicated before session, Ice applied    Home Living                          Prior Function            PT Goals (current goals can now be found in the care plan section) Progress towards PT goals: Progressing toward goals    Frequency    7X/week      PT Plan      Co-evaluation              AM-PAC PT "6 Clicks" Mobility   Outcome Measure  Help needed turning from your back to your side while in a flat bed without using bedrails?: None Help needed moving from lying on your back to sitting on the side of a flat bed without using bedrails?: A Little Help needed moving to and from a bed to a chair (including a wheelchair)?: A Little Help needed standing up from a chair using your arms (e.g., wheelchair or bedside chair)?: A Little Help needed to walk in hospital room?: A Little Help needed climbing 3-5 steps with a railing? : A Little 6 Click Score: 19    End of Session Equipment Utilized During Treatment: Gait belt Activity Tolerance: Patient tolerated treatment well Patient left: with call bell/phone within reach;with family/visitor present;in chair Nurse Communication: Mobility status PT Visit Diagnosis: Other abnormalities of gait and mobility (R26.89);Muscle weakness (generalized) (M62.81)     Time: 2952-8413 PT Time Calculation (min) (ACUTE ONLY): 25 min  Charges:    $Gait Training: 8-22 mins $Therapeutic Exercise: 8-22 mins PT General Charges $$ ACUTE PT VISIT: 1 Visit                     Anise Salvo, PT Acute Rehab  Services [place] [facility] [phone]    Rayetta Humphrey 12/12/2022, 12:05 PM

## 2022-12-12 NOTE — Plan of Care (Signed)
  Problem: Education: Goal: Knowledge of General Education information will improve Description: Including pain rating scale, medication(s)/side effects and non-pharmacologic comfort measures Outcome: Adequate for Discharge   Problem: Health Behavior/Discharge Planning: Goal: Ability to manage health-related needs will improve Outcome: Adequate for Discharge   Problem: Clinical Measurements: Goal: Ability to maintain clinical measurements within normal limits will improve Outcome: Progressing Goal: Will remain free from infection Outcome: Progressing Goal: Diagnostic test results will improve Outcome: Progressing Goal: Respiratory complications will improve Outcome: Progressing Goal: Cardiovascular complication will be avoided Outcome: Progressing   Problem: Activity: Goal: Risk for activity intolerance will decrease Outcome: Adequate for Discharge   Problem: Nutrition: Goal: Adequate nutrition will be maintained Outcome: Completed/Met   Problem: Coping: Goal: Level of anxiety will decrease Outcome: Progressing   Problem: Elimination: Goal: Will not experience complications related to bowel motility Outcome: Progressing Goal: Will not experience complications related to urinary retention Outcome: Completed/Met   Problem: Pain Management: Goal: General experience of comfort will improve Outcome: Progressing   Problem: Safety: Goal: Ability to remain free from injury will improve Outcome: Progressing   Problem: Skin Integrity: Goal: Risk for impaired skin integrity will decrease Outcome: Adequate for Discharge   Problem: Education: Goal: Knowledge of the prescribed therapeutic regimen will improve Outcome: Progressing Goal: Individualized Educational Video(s) Outcome: Completed/Met   Problem: Activity: Goal: Ability to avoid complications of mobility impairment will improve Outcome: Adequate for Discharge Goal: Range of joint motion will improve Outcome:  Adequate for Discharge   Problem: Clinical Measurements: Goal: Postoperative complications will be avoided or minimized Outcome: Progressing   Problem: Pain Management: Goal: Pain level will decrease with appropriate interventions Outcome: Progressing   Problem: Skin Integrity: Goal: Will show signs of wound healing Outcome: Progressing

## 2022-12-12 NOTE — Care Management Obs Status (Signed)
MEDICARE OBSERVATION STATUS NOTIFICATION   Patient Details  Name: [name] Woods MRN: [id number] Date of Birth: 11-Mar-1940   Medicare Observation Status Notification Given:  Yes    Ewing Schlein, LCSW 12/12/2022, 1:54 PM

## 2022-12-12 NOTE — TOC Transition Note (Signed)
Transition of Care [facility]) - CM/SW Discharge Note  Patient Details  Name: [name] Woods MRN: [id number] Date of Birth: [birth year]  [facility]) CM/SW Contact:  Ewing Schlein, LCSW Phone Number: 12/12/2022, 10:12 AM  Clinical Narrative: Patient is expected to discharge home after working with PT. CSW met with patient to confirm discharge plan. Patient will go home with OPPT at [facility] with the first appointment scheduled for 12/17/22. Patient has a rolling walker and cane at home, so there are no DME needs at this time. TOC signing off.  Final next level of care: OP Rehab Barriers to Discharge: No Barriers Identified  Patient Goals and CMS Choice Choice offered to / list presented to : NA  Discharge Plan and Services Additional resources added to the After Visit Summary for       DME Arranged: N/A DME Agency: NA HH Arranged: NA HH Agency: NA  Social Determinants of Health (SDOH) Interventions SDOH Screenings   Food Insecurity: No Food Insecurity (12/11/2022)  Housing: Low Risk  (12/11/2022)  Transportation Needs: No Transportation Needs (12/11/2022)  Utilities: Not At Risk (12/11/2022)  Depression (PHQ2-9): Low Risk  (11/19/2022)  Financial Resource Strain: Low Risk  (11/19/2022)  Physical Activity: Unknown (11/19/2022)  Social Connections: Socially Isolated (11/19/2022)  Stress: No Stress Concern Present (11/19/2022)  Tobacco Use: Low Risk  (12/11/2022)   Readmission Risk Interventions     No data to display

## 2022-12-12 NOTE — Progress Notes (Signed)
Subjective: 1 Day Post-Op Procedure(s) (LRB): TOTAL KNEE ARTHROPLASTY (Right) Patient reports pain as mild.   Patient seen in rounds by Dr. Charlann Boxer. Patient is well, and has had no acute complaints or problems. No acute events overnight. Foley catheter removed. Patient ambulated 20 feet with PT.  We will start therapy today.   Objective: Vital signs in last 24 hours: Temp:  [97.4 F (36.3 C)-98.2 F (36.8 C)] 97.9 F (36.6 C) (11/13 0615) Pulse Rate:  [52-68] 54 (11/13 0126) Resp:  [12-22] 17 (11/13 0615) BP: (112-163)/(51-80) 115/51 (11/13 0615) SpO2:  [94 %-100 %] 98 % (11/13 0615) Weight:  [90.3 kg] 90.3 kg (11/12 0813)  Intake/Output from previous day:  Intake/Output Summary (Last 24 hours) at 12/12/2022 0731 Last data filed at 12/12/2022 0126 Gross per 24 hour  Intake 1407.17 ml  Output 1160 ml  Net 247.17 ml     Intake/Output this shift: No intake/output data recorded.  Labs: Recent Labs    12/12/22 0345  HGB 11.3*   Recent Labs    12/12/22 0345  WBC 15.2*  RBC 3.69*  HCT 34.3*  PLT 160   Recent Labs    12/12/22 0345  NA 133*  K 4.0  CL 97*  CO2 27  BUN 23  CREATININE 1.64*  GLUCOSE 183*  CALCIUM 8.4*   No results for input(s): "LABPT", "INR" in the last 72 hours.  Exam: General - Patient is Alert and Oriented Extremity - Neurologically intact Sensation intact distally Intact pulses distally Dorsiflexion/Plantar flexion intact Dressing - dressing C/D/I Motor Function - intact, moving foot and toes well on exam.   Past Medical History:  Diagnosis Date   Ankylosing spondylitis (HCC)    Anxiety and depression 09/12/2015   Aortic atherosclerosis (HCC)    Arthritis    Bilateral inguinal hernia    CAD (coronary artery disease) 4010,2725   30% mid LAD lesion on cardiac cath   Cancer [facility])    left arm   Chicken pox    Cholelithiasis    CHOLELITHIASIS, HX OF 04/25/2007   Qualifier: Diagnosis of  By: Alesia Richards      Diverticulosis 11/26/2018   Moderate, Left Noted on Colonoscopy   Dysrhythmia    atrial fibrillation   Gallstones    GERD (gastroesophageal reflux disease)    German measles    Hiatal hernia    History of colon polyps 11/26/2018   History of kidney stones    HLD (hyperlipidemia)    HTN (hypertension)    Hypertension    Nephrolithiasis    NEPHROLITHIASIS, HX OF 04/25/2007   Qualifier: Diagnosis of  By: Alesia Richards     Partial small bowel obstruction (HCC) 03/06/2020   Persistent atrial fibrillation (HCC)    Pneumonia    Sleep apnea    No CPAP   Tubular adenoma of colon 03/1993   Ventricular hypertrophy     Assessment/Plan: 1 Day Post-Op Procedure(s) (LRB): TOTAL KNEE ARTHROPLASTY (Right) Principal Problem:   S/P total knee arthroplasty, right  Estimated body mass index is 27.75 kg/m as calculated from the following:   Height as of this encounter: 5\' 11"  (1.803 m).   Weight as of this encounter: 90.3 kg. Advance diet Up with therapy   Patient's anticipated LOS is less than 2 midnights, meeting these requirements: - Younger than 71 - Lives within 1 hour of care - Has a competent adult at home to recover with post-op recover - NO history of  - Chronic pain  requiring opiods  - Diabetes  - Coronary Artery Disease  - Heart failure  - Heart attack  - Stroke  - DVT/VTE  - Cardiac arrhythmia  - Respiratory Failure/COPD  - Renal failure  - Anemia  - Advanced Liver disease     DVT Prophylaxis -  Eliquis Weight bearing as tolerated.  Hgb stable at 11.3 this AM.  We discussed his pain meds - will plan to try oxycodone 10 mg q4h at home. He has not had a dose since last night at 8 PM currently.   Plan is to go Home after hospital stay. Plan for discharge today following 1-2 sessions of PT as long as they are meeting their goals. Patient is scheduled for OPPT. Follow up in the office in 2 weeks.   Rosalene Billings, PA-C Orthopedic Surgery [phone] 12/12/2022, 7:31 AM

## 2022-12-16 NOTE — Discharge Summary (Signed)
Patient ID: [name] Woods MRN: [id number] DOB/AGE: Nov 07, 1940 82 y.o.  Admit date: 12/11/2022 Discharge date: 12/12/2022  Admission Diagnoses:  Right knee osteoarthritis  Discharge Diagnoses:  Principal Problem:   S/P total knee arthroplasty, right   Past Medical History:  Diagnosis Date   Ankylosing spondylitis (HCC)    Anxiety and depression 09/12/2015   Aortic atherosclerosis (HCC)    Arthritis    Bilateral inguinal hernia    CAD (coronary artery disease) 4098,1191   30% mid LAD lesion on cardiac cath   Cancer [facility])    left arm   Chicken pox    Cholelithiasis    CHOLELITHIASIS, HX OF 04/25/2007   Qualifier: Diagnosis of  By: Alesia Richards     Diverticulosis 11/26/2018   Moderate, Left Noted on Colonoscopy   Dysrhythmia    atrial fibrillation   Gallstones    GERD (gastroesophageal reflux disease)    German measles    Hiatal hernia    History of colon polyps 11/26/2018   History of kidney stones    HLD (hyperlipidemia)    HTN (hypertension)    Hypertension    Nephrolithiasis    NEPHROLITHIASIS, HX OF 04/25/2007   Qualifier: Diagnosis of  By: Alesia Richards     Partial small bowel obstruction (HCC) 03/06/2020   Persistent atrial fibrillation (HCC)    Pneumonia    Sleep apnea    No CPAP   Tubular adenoma of colon 03/1993   Ventricular hypertrophy     Surgeries: Procedure(s): TOTAL KNEE ARTHROPLASTY on 12/11/2022   Consultants:   Discharged Condition: Improved  Hospital Course: [name] Woods is an 82 y.o. male who was admitted 12/11/2022 for operative treatment ofS/P total knee arthroplasty, right. Patient has severe unremitting pain that affects sleep, daily activities, and work/hobbies. After pre-op clearance the patient was taken to the operating room on 12/11/2022 and underwent  Procedure(s): TOTAL KNEE ARTHROPLASTY.    Patient was given perioperative antibiotics:  Anti-infectives (From admission, onward)    Start     Dose/Rate  Route Frequency Ordered Stop   12/11/22 1600  ceFAZolin (ANCEF) IVPB 2g/100 mL premix        2 g 200 mL/hr over 30 Minutes Intravenous Every 6 hours 12/11/22 1504 12/11/22 2347   12/11/22 0800  ceFAZolin (ANCEF) IVPB 2g/100 mL premix        2 g 200 mL/hr over 30 Minutes Intravenous On call to O.R. 12/11/22 0745 12/11/22 0943        Patient was given sequential compression devices, early ambulation, and chemoprophylaxis to prevent DVT. Patient worked with PT and was meeting their goals regarding safe ambulation and transfers.  Patient benefited maximally from hospital stay and there were no complications.    Recent vital signs: No data found.   Recent laboratory studies: No results for input(s): "WBC", "HGB", "HCT", "PLT", "NA", "K", "CL", "CO2", "BUN", "CREATININE", "GLUCOSE", "INR", "CALCIUM" in the last 72 hours.  Invalid input(s): "PT", "2"   Discharge Medications:   Allergies as of 12/12/2022       Reactions   Warfarin Other (See Comments)   GI bleeds   Nsaids Other (See Comments)   Only Tylenol is tolerated- is not supposed to take this because he's on Eliquis        Medication List     STOP taking these medications    cyclobenzaprine 10 MG tablet Commonly known as: FLEXERIL   HYDROcodone-acetaminophen 10-325 MG tablet Commonly known as: NORCO   ondansetron  4 MG tablet Commonly known as: ZOFRAN       TAKE these medications    acetaminophen 325 MG tablet Commonly known as: TYLENOL Take 2 tablets (650 mg total) by mouth every 6 (six) hours as needed for mild pain (or Fever >/= 101).   allopurinol 100 MG tablet Commonly known as: ZYLOPRIM TAKE 1 TABLET EVERY DAY   atorvastatin 10 MG tablet Commonly known as: LIPITOR TAKE 1 TABLET EVERY DAY   cyanocobalamin 1000 MCG tablet Commonly known as: VITAMIN B12 Take 1 tablet (1,000 mcg total) by mouth daily.   diltiazem 240 MG 24 hr capsule Commonly known as: CARDIZEM CD TAKE 1 CAPSULE EVERY DAY    Eliquis 5 MG Tabs tablet Generic drug: apixaban Take 1 tablet (5 mg total) by mouth 2 (two) times daily.   furosemide 20 MG tablet Commonly known as: LASIX TAKE 1 TABLET EVERY DAY   metoprolol succinate 100 MG 24 hr tablet Commonly known as: TOPROL-XL TAKE 1 TABLET TWICE DAILY WITH OR IMMEDIATELY FOLLOWING MEALS (NEED MD APPOINTMENT FOR REFILLS)   Oxycodone HCl 10 MG Tabs Take 1 tablet (10 mg total) by mouth every 4 (four) hours as needed for severe pain (pain score 7-10). Do not take hydrocodone while on this medication   polyethylene glycol 17 g packet Commonly known as: MIRALAX / GLYCOLAX Mix 17 g (1 packet) in beverage and take by mouth 2 (two) times daily.   senna 8.6 MG Tabs tablet Commonly known as: SENOKOT Take 2 tablets (17.2 mg total) by mouth at bedtime for 14 days.   tadalafil 5 MG tablet Commonly known as: CIALIS Take 1 tablet (5 mg total) by mouth daily.   tiZANidine 2 MG tablet Commonly known as: ZANAFLEX Take 1 tablet (2 mg total) by mouth every 8 (eight) hours as needed for muscle spasms.               Discharge Care Instructions  (From admission, onward)           Start     Ordered   12/12/22 0000  Change dressing       Comments: Maintain surgical dressing until follow up in the clinic. If the edges start to pull up, may reinforce with tape. If the dressing is no longer working, may remove and cover with gauze and tape, but must keep the area dry and clean.  Call with any questions or concerns.   12/12/22 0739            Diagnostic Studies: No results found.  Disposition: Discharge disposition: 01-Home or Self Care       Discharge Instructions     Call MD / Call 911   Complete by: As directed    If you experience chest pain or shortness of breath, CALL 911 and be transported to the hospital emergency room.  If you develope a fever above 101 F, pus (white drainage) or increased drainage or redness at the wound, or calf pain,  call your surgeon's office.   Change dressing   Complete by: As directed    Maintain surgical dressing until follow up in the clinic. If the edges start to pull up, may reinforce with tape. If the dressing is no longer working, may remove and cover with gauze and tape, but must keep the area dry and clean.  Call with any questions or concerns.   Constipation Prevention   Complete by: As directed    Drink plenty of fluids.  Prune juice  may be helpful.  You may use a stool softener, such as Colace (over the counter) 100 mg twice a day.  Use MiraLax (over the counter) for constipation as needed.   Diet - low sodium heart healthy   Complete by: As directed    Increase activity slowly as tolerated   Complete by: As directed    Weight bearing as tolerated with assist device (walker, cane, etc) as directed, use it as long as suggested by your surgeon or therapist, typically at least 4-6 weeks.   Post-operative opioid taper instructions:   Complete by: As directed    POST-OPERATIVE OPIOID TAPER INSTRUCTIONS: It is important to wean off of your opioid medication as soon as possible. If you do not need pain medication after your surgery it is ok to stop day one. Opioids include: Codeine, Hydrocodone(Norco, Vicodin), Oxycodone(Percocet, oxycontin) and hydromorphone amongst others.  Long term and even short term use of opiods can cause: Increased pain response Dependence Constipation Depression Respiratory depression And more.  Withdrawal symptoms can include Flu like symptoms Nausea, vomiting And more Techniques to manage these symptoms Hydrate well Eat regular healthy meals Stay active Use relaxation techniques(deep breathing, meditating, yoga) Do Not substitute Alcohol to help with tapering If you have been on opioids for less than two weeks and do not have pain than it is ok to stop all together.  Plan to wean off of opioids This plan should start within one week post op of your joint  replacement. Maintain the same interval or time between taking each dose and first decrease the dose.  Cut the total daily intake of opioids by one tablet each day Next start to increase the time between doses. The last dose that should be eliminated is the evening dose.      TED hose   Complete by: As directed    Use stockings (TED hose) for 2 weeks on both leg(s).  You may remove them at night for sleeping.        Follow-up Information     Cassandria Anger, PA-C. Go on 12/26/2022.   Specialty: Orthopedic Surgery Why: You are scheduled for a follow up appointment on 12-26-22 at 9:15 am. Contact information: [address] [place] [address] [phone]         Ardith Dark, MD. Schedule an appointment as soon as possible for a visit in 2 week(s).   Specialty: Family Medicine Why: Recheck creatinine for Eliquis dosing - per pharmacy recommendations Contact information: [address] [place] [address] [phone]                  Signed: Cassandria Anger 12/16/2022, 7:26 AM

## 2022-12-17 DIAGNOSIS — M25561 Pain in right knee: Secondary | ICD-10-CM | POA: Diagnosis not present

## 2022-12-17 DIAGNOSIS — M25661 Stiffness of right knee, not elsewhere classified: Secondary | ICD-10-CM | POA: Diagnosis not present

## 2022-12-19 ENCOUNTER — Other Ambulatory Visit (HOSPITAL_COMMUNITY): Payer: Self-pay

## 2022-12-19 MED ORDER — OXYCODONE HCL 10 MG PO TABS
10.0000 mg | ORAL_TABLET | ORAL | 0 refills | Status: DC | PRN
  Filled 2022-12-19 – 2022-12-20 (×2): qty 42, 7d supply, fill #0

## 2022-12-20 ENCOUNTER — Other Ambulatory Visit: Payer: Self-pay

## 2022-12-20 ENCOUNTER — Other Ambulatory Visit (HOSPITAL_COMMUNITY): Payer: Self-pay

## 2022-12-22 ENCOUNTER — Other Ambulatory Visit: Payer: Self-pay | Admitting: Family Medicine

## 2022-12-25 ENCOUNTER — Other Ambulatory Visit (HOSPITAL_COMMUNITY): Payer: Self-pay

## 2022-12-25 MED ORDER — OXYCODONE HCL 10 MG PO TABS
10.0000 mg | ORAL_TABLET | ORAL | 0 refills | Status: DC | PRN
  Filled 2022-12-26: qty 42, 7d supply, fill #0

## 2022-12-26 ENCOUNTER — Other Ambulatory Visit (HOSPITAL_COMMUNITY): Payer: Self-pay

## 2023-01-03 ENCOUNTER — Other Ambulatory Visit (HOSPITAL_COMMUNITY): Payer: Self-pay

## 2023-01-04 ENCOUNTER — Other Ambulatory Visit (HOSPITAL_COMMUNITY): Payer: Self-pay

## 2023-01-04 MED ORDER — HYDROCODONE-ACETAMINOPHEN 10-325 MG PO TABS
1.0000 | ORAL_TABLET | ORAL | 0 refills | Status: DC | PRN
  Filled 2023-01-04: qty 42, 7d supply, fill #0

## 2023-01-10 ENCOUNTER — Other Ambulatory Visit (HOSPITAL_COMMUNITY): Payer: Self-pay

## 2023-01-10 MED ORDER — HYDROCODONE-ACETAMINOPHEN 10-325 MG PO TABS
1.0000 | ORAL_TABLET | ORAL | 0 refills | Status: DC | PRN
  Filled 2023-01-10: qty 42, 7d supply, fill #0

## 2023-01-15 ENCOUNTER — Other Ambulatory Visit (HOSPITAL_COMMUNITY): Payer: Self-pay

## 2023-01-17 ENCOUNTER — Other Ambulatory Visit (HOSPITAL_COMMUNITY): Payer: Self-pay

## 2023-01-17 MED ORDER — HYDROCODONE-ACETAMINOPHEN 10-325 MG PO TABS
1.0000 | ORAL_TABLET | ORAL | 0 refills | Status: DC | PRN
  Filled 2023-01-17: qty 42, 7d supply, fill #0

## 2023-01-24 ENCOUNTER — Other Ambulatory Visit (HOSPITAL_COMMUNITY): Payer: Self-pay

## 2023-01-24 ENCOUNTER — Other Ambulatory Visit: Payer: Self-pay

## 2023-01-25 ENCOUNTER — Other Ambulatory Visit (HOSPITAL_COMMUNITY): Payer: Self-pay

## 2023-01-28 ENCOUNTER — Other Ambulatory Visit (HOSPITAL_COMMUNITY): Payer: Self-pay

## 2023-01-28 MED ORDER — HYDROCODONE-ACETAMINOPHEN 10-325 MG PO TABS
1.0000 | ORAL_TABLET | ORAL | 0 refills | Status: DC | PRN
  Filled 2023-01-28: qty 42, 7d supply, fill #0

## 2023-01-29 ENCOUNTER — Other Ambulatory Visit (HOSPITAL_COMMUNITY): Payer: Self-pay

## 2023-02-03 ENCOUNTER — Telehealth: Payer: Medicare HMO

## 2023-02-03 ENCOUNTER — Telehealth: Payer: Medicare HMO | Admitting: Physician Assistant

## 2023-02-03 DIAGNOSIS — R3989 Other symptoms and signs involving the genitourinary system: Secondary | ICD-10-CM

## 2023-02-03 NOTE — Progress Notes (Signed)

## 2023-02-06 ENCOUNTER — Other Ambulatory Visit (HOSPITAL_COMMUNITY): Payer: Self-pay

## 2023-02-06 DIAGNOSIS — Z4789 Encounter for other orthopedic aftercare: Secondary | ICD-10-CM | POA: Diagnosis not present

## 2023-02-06 DIAGNOSIS — Z471 Aftercare following joint replacement surgery: Secondary | ICD-10-CM | POA: Diagnosis not present

## 2023-02-06 DIAGNOSIS — Z96651 Presence of right artificial knee joint: Secondary | ICD-10-CM | POA: Diagnosis not present

## 2023-02-06 MED ORDER — HYDROCODONE-ACETAMINOPHEN 10-325 MG PO TABS
1.0000 | ORAL_TABLET | ORAL | 0 refills | Status: DC | PRN
  Filled 2023-02-06: qty 42, 7d supply, fill #0

## 2023-02-07 ENCOUNTER — Telehealth: Payer: Self-pay | Admitting: *Deleted

## 2023-02-07 ENCOUNTER — Other Ambulatory Visit (HOSPITAL_COMMUNITY): Payer: Self-pay

## 2023-02-07 NOTE — Telephone Encounter (Signed)
 Copied from CRM [id number]. Topic: Clinical - Medical Advice >> Feb 07, 2023  1:53 PM Almira Coaster wrote: Reason for CRM: Patient called to advise that he had right knee surgery on 12/11/2022 and was released yesterday and to advise Primary care provider.

## 2023-02-07 NOTE — Telephone Encounter (Signed)
 FYI, Patient called to advise that he had right knee surgery on 12/11/2022 and was released yesterday and to advise Primary care provider.

## 2023-02-08 ENCOUNTER — Telehealth: Payer: Self-pay | Admitting: Family Medicine

## 2023-02-08 ENCOUNTER — Encounter: Payer: Self-pay | Admitting: Cardiology

## 2023-02-08 NOTE — Telephone Encounter (Signed)
 Copied from CRM [id number]. Topic: Clinical - Medication Refill >> Feb 07, 2023  1:56 PM Franky GRADE wrote: Most Recent Primary Care Visit:  Provider: KENNYTH WORTH HERO  Department: [facility]  Visit Type: OFFICE VISIT  Date: 11/19/2022  Medication: HYDROcodone -acetaminophen  (NORCO) 10-325 MG tablet  Has the patient contacted their pharmacy? No, Surgeon who preformed right knee surgery advised patient to request refill from pcp.  (Agent: If no, request that the patient contact the pharmacy for the refill. If patient does not wish to contact the pharmacy document the reason why and proceed with request.) (Agent: If yes, when and what did the pharmacy advise?)  Is this the correct pharmacy for this prescription? Yes If no, delete pharmacy and type the correct one.  This is the patient's preferred pharmacy:  [place] [facility] [address] [place] [address] Phone: [phone] Fax: [phone]    Has the prescription been filled recently? No  Is the patient out of the medication? Yes  Has the patient been seen for an appointment in the last year OR does the patient have an upcoming appointment? Yes  Can we respond through MyChart? Yes  Agent: Please be advised that Rx refills may take up to 3 business days. We ask that you follow-up with your pharmacy.    Chief Complaint: Medication question  Additional Notes: Per chart review, completed 01/09

## 2023-02-11 ENCOUNTER — Other Ambulatory Visit (HOSPITAL_COMMUNITY): Payer: Self-pay

## 2023-02-11 ENCOUNTER — Other Ambulatory Visit: Payer: Self-pay | Admitting: Family Medicine

## 2023-02-12 ENCOUNTER — Other Ambulatory Visit (HOSPITAL_COMMUNITY): Payer: Self-pay

## 2023-02-12 ENCOUNTER — Other Ambulatory Visit: Payer: Self-pay

## 2023-02-12 MED ORDER — HYDROCODONE-ACETAMINOPHEN 10-325 MG PO TABS
1.0000 | ORAL_TABLET | ORAL | 0 refills | Status: DC | PRN
  Filled 2023-02-12 – 2023-02-13 (×2): qty 42, 7d supply, fill #0

## 2023-02-12 NOTE — Telephone Encounter (Signed)
 Copied from CRM [id number]. Topic: Clinical - Medication Refill >> Feb 12, 2023 12:02 PM Nestora J wrote: Most Recent Primary Care Visit:  Provider: KENNYTH WORTH HERO  Department: [facility]  Visit Type: OFFICE VISIT  Date: 11/19/2022  Medication: HYDROcodone -acetaminophen  (NORCO) 10-325 MG tablet  Has the patient contacted their pharmacy? Yes (Agent: If no, request that the patient contact the pharmacy for the refill. If patient does not wish to contact the pharmacy document the reason why and proceed with request.) (Agent: If yes, when and what did the pharmacy advise?) Contact PCP  Is this the correct pharmacy for this prescription? Yes If no, delete pharmacy and type the correct one.  This is the patient's preferred pharmacy:  [place] [facility] [address] [place] [address] Phone: [phone] Fax: [phone]    Has the prescription been filled recently? Yes  Is the patient out of the medication? Yes  Has the patient been seen for an appointment in the last year OR does the patient have an upcoming appointment? Yes  Can we respond through MyChart? No  Agent: Please be advised that Rx refills may take up to 3 business days. We ask that you follow-up with your pharmacy.

## 2023-02-12 NOTE — Telephone Encounter (Signed)
 Copied from CRM [id number]. Topic: Clinical - Prescription Issue >> Feb 12, 2023  4:16 PM Adaysia C wrote: Reason for CRM: Patients preferred pharmacy refilled the wrong amount of HYDROcodone -acetaminophen  (NORCO) 10-325 MG tablets, they refilled it for 42 tablets and patient is supposed to get 120 tablets for the month. Please contact patients pharmacy to resubmit the correct prescription amount. Phone: [phone] [place] [facility] [address], [place] [address]

## 2023-02-13 ENCOUNTER — Other Ambulatory Visit (HOSPITAL_COMMUNITY): Payer: Self-pay

## 2023-02-14 ENCOUNTER — Other Ambulatory Visit (HOSPITAL_COMMUNITY): Payer: Self-pay

## 2023-02-14 ENCOUNTER — Encounter (HOSPITAL_COMMUNITY): Payer: Self-pay

## 2023-02-14 ENCOUNTER — Other Ambulatory Visit: Payer: Self-pay | Admitting: Family Medicine

## 2023-02-14 ENCOUNTER — Other Ambulatory Visit: Payer: Self-pay

## 2023-02-15 ENCOUNTER — Other Ambulatory Visit (HOSPITAL_COMMUNITY): Payer: Self-pay

## 2023-02-15 NOTE — Telephone Encounter (Unsigned)
Copied from CRM [id number]. Topic: Clinical - Prescription Issue >> Feb 15, 2023 10:15 AM Corin V wrote: Reason for CRM: Patient called in regarding his hydrocodone Rx. He spoke to the pharmacy again this morning and they still have not received an updated Rx from Dr. Jimmey Ralph so that the patient can get his full script. [facility] only administered 42 pills and he should be receiving 120 for the month. Please speak to the pharmacy to clarify issue and get correct Rx sent in and please notify patient when issue is handled.

## 2023-02-18 ENCOUNTER — Other Ambulatory Visit (HOSPITAL_COMMUNITY): Payer: Self-pay

## 2023-02-18 ENCOUNTER — Encounter: Payer: Self-pay | Admitting: Cardiology

## 2023-02-18 ENCOUNTER — Telehealth: Payer: Self-pay | Admitting: *Deleted

## 2023-02-18 NOTE — Progress Notes (Unsigned)
Cardiology Office Note:   Date:  02/20/2023  ID:  [name] Woods, DOB Jun 19, 1940, MRN [id number] PCP: Ardith Dark, MD  [facility] Providers Cardiologist:  Rollene Rotunda, MD {  History of Present Illness:   [name] Woods is a 83 y.o. male who with chronic atrial fibrillation, minimal CAD and hypertension.  He had a cardiac catheterization on 03/24/2012 which showed very mild nonobstructive CAD.  EF was mildly reduced to 45% the time.  Repeat echocardiogram in June 2015 showed an EF improved to 55%.  ETT in March 2018 showed ST segment depressions in inferolateral leads, poor exercise tolerance.  Echocardiogram in March 2018 showed EF 55 to 60%, mild AI, small posterior pericardial effusion.  He did have 2 episode of presyncope and was seen in September 2018, it was decided to watch and wait to see if he has any more episodes.  His diltiazem was increased at the time.    Since we last talked to him he had right knee arthroplasty.  He called the other day because he has been waking up with a little bit of chest discomfort and had taken his blood pressure at 1 point in time and it was "202/155."  He has been keeping a blood pressure log and I did review this and his blood pressures have been in the 170s 160s fairly routinely systolic.  He is in atrial fibrillation and his heart rates in the 50s or 60s.  He does not really feel this.  He has not had any chest discomfort during the day.  He has not had any new shortness of breath, PND or orthopnea.  He is not really feeling any tachypalpitations other than what he describes.   ROS: As stated in the HPI and negative for all other systems.  Studies Reviewed:    EKG:   EKG Interpretation Date/Time:  Wednesday February 20 2023 14:36:42 EST Ventricular Rate:  53 PR Interval:    QRS Duration:  156 QT Interval:  490 QTC Calculation: 459 R Axis:   -24  Text Interpretation: Atrial fibrillation Left bundle branch block When compared  with ECG of 21-May-2022 10:59, PVCs are new Confirmed by Rollene Rotunda ([id number]) on 02/20/2023 3:06:18 PM     Risk Assessment/Calculations:    CHA2DS2-VASc Score = 4   This indicates a 4.8% annual risk of stroke. The patient's score is based upon: CHF History: 0 HTN History: 1 Diabetes History: 0 Stroke History: 0 Vascular Disease History: 1 Age Score: 2 Gender Score: 0       Physical Exam:   VS:  BP (!) 140/70 (BP Location: Left Arm, Patient Position: Sitting, Cuff Size: Normal)   Pulse (!) 53   Ht 6' (1.829 m)   Wt 198 lb 6.4 oz (90 kg)   SpO2 98%   BMI 26.91 kg/m    Wt Readings from Last 3 Encounters:  02/20/23 198 lb 6.4 oz (90 kg)  12/11/22 199 lb (90.3 kg)  12/04/22 199 lb (90.3 kg)     GEN: Well nourished, well developed in no acute distress NECK: No JVD; No carotid bruits CARDIAC: Irregular RR, brief 2 out of 6 apical systolic murmur radiating slightly at the aortic outflow tract, no diastolic murmurs, rubs, gallops RESPIRATORY:  Clear to auscultation without rales, wheezing or rhonchi  ABDOMEN: Soft, non-tender, non-distended EXTREMITIES:  No edema; No deformity   ASSESSMENT AND PLAN:   CAD:   The patient has no new sypmtoms.  No further cardiovascular testing  is indicated.  We will continue with aggressive risk reduction and meds as listed.  Chronic atrial fibrillation:    [name] has a CHA2DS2 - VASc score of 3.  Given his age and his creatinine he needs to have his Eliquis dose reduced.  AKI:    Creatinine was 1.64.  No change in therapy.  I will check a basic metabolic profile today.  We will check a basic metabolic profile today  HTN:  The blood pressure is not at target.  Emina try to avoid an ARB.  He is already on a beta-blocker and calcium channel blocker.  Therefore, I will start hydralazine 25 mg twice daily.  If he has spikes in his blood pressure to be able to take a third dose.  He is to keep a blood pressure diary.      Follow up  with APP in six months.   Signed, Rollene Rotunda, MD

## 2023-02-18 NOTE — Telephone Encounter (Signed)
Called and spoke to patient. Appointment scheduled with Dr Antoine Poche for 1/22 at 2:20 pm.

## 2023-02-18 NOTE — Telephone Encounter (Signed)
Copied from CRM [id number]. Topic: Clinical - Prescription Issue >> Feb 18, 2023  2:16 PM Larwance Sachs wrote: Reason for CRM: Patient called in regarding HYDROcodone-acetaminophen (NORCO) 10-325 MG tablet, stated this has been ongoing and he needs help and is requesting Dr. Jimmey Ralph nurse call him as soon as possible at   [phone]  Please advise [facility]

## 2023-02-19 ENCOUNTER — Other Ambulatory Visit (HOSPITAL_COMMUNITY): Payer: Self-pay

## 2023-02-19 MED ORDER — HYDROCODONE-ACETAMINOPHEN 10-325 MG PO TABS
1.0000 | ORAL_TABLET | Freq: Four times a day (QID) | ORAL | 0 refills | Status: DC | PRN
  Filled 2023-02-19: qty 120, 30d supply, fill #0

## 2023-02-19 NOTE — Telephone Encounter (Signed)
Spoke with patient,stated been taking everyday for pain  Did not pickup new prescription due to quantity Stated in the past he was prescribed #120 and requesting that amount  Please advise

## 2023-02-19 NOTE — Telephone Encounter (Signed)
Can we clarify what he needs? We sent in a refill a week ago.  Katina Degree. Jimmey Ralph, MD 02/19/2023 7:29 AM

## 2023-02-20 ENCOUNTER — Ambulatory Visit: Payer: Medicare HMO | Attending: Cardiology | Admitting: Cardiology

## 2023-02-20 ENCOUNTER — Other Ambulatory Visit: Payer: Self-pay

## 2023-02-20 ENCOUNTER — Encounter: Payer: Self-pay | Admitting: Cardiology

## 2023-02-20 ENCOUNTER — Other Ambulatory Visit (HOSPITAL_COMMUNITY): Payer: Self-pay

## 2023-02-20 VITALS — BP 140/70 | HR 53 | Ht 72.0 in | Wt 198.4 lb

## 2023-02-20 DIAGNOSIS — I482 Chronic atrial fibrillation, unspecified: Secondary | ICD-10-CM

## 2023-02-20 DIAGNOSIS — N179 Acute kidney failure, unspecified: Secondary | ICD-10-CM | POA: Diagnosis not present

## 2023-02-20 DIAGNOSIS — I2510 Atherosclerotic heart disease of native coronary artery without angina pectoris: Secondary | ICD-10-CM | POA: Diagnosis not present

## 2023-02-20 DIAGNOSIS — I10 Essential (primary) hypertension: Secondary | ICD-10-CM | POA: Diagnosis not present

## 2023-02-20 MED ORDER — APIXABAN 2.5 MG PO TABS
2.5000 mg | ORAL_TABLET | Freq: Two times a day (BID) | ORAL | 3 refills | Status: DC
  Filled 2023-02-20: qty 180, 90d supply, fill #0
  Filled 2023-03-07 (×2): qty 20, 10d supply, fill #0

## 2023-02-20 MED ORDER — HYDRALAZINE HCL 25 MG PO TABS
25.0000 mg | ORAL_TABLET | Freq: Two times a day (BID) | ORAL | 3 refills | Status: AC
  Filled 2023-02-20: qty 180, 90d supply, fill #0
  Filled 2023-06-18: qty 180, 90d supply, fill #1
  Filled 2023-09-16: qty 180, 90d supply, fill #2
  Filled 2023-12-16: qty 180, 90d supply, fill #3

## 2023-02-20 NOTE — Patient Instructions (Addendum)
Medication Instructions:  Decrease Eliquis to 2.5 mg by mouth two times per day. Start Hydralazine 25 mg by mouth two times per day. Scripts sent to [facility] pharmacy. *If you need a refill on your cardiac medications before your next appointment, please call your pharmacy*   Lab Work: BMET, CBC today. If you have labs (blood work) drawn today and your tests are completely normal, you will receive your results only by: MyChart Message (if you have MyChart) OR A paper copy in the mail If you have any lab test that is abnormal or we need to change your treatment, we will call you to review the results.   Follow-Up: At [facility], you and your health needs are our priority.  As part of our continuing mission to provide you with exceptional heart care, we have created designated Provider Care Teams.  These Care Teams include your primary Cardiologist (physician) and Advanced Practice Providers (APPs -  Physician Assistants and Nurse Practitioners) who all work together to provide you with the care you need, when you need it.  We recommend signing up for the patient portal called "MyChart".  Sign up information is provided on this After Visit Summary.  MyChart is used to connect with patients for Virtual Visits (Telemedicine).  Patients are able to view lab/test results, encounter notes, upcoming appointments, etc.  Non-urgent messages can be sent to your provider as well.   To learn more about what you can do with MyChart, go to [email or web address].    Your next appointment:   6 month(s)  Provider:   Rollene Rotunda, MD    Other Instructions Take and record blood pressure 2 times per day, for 3 weeks.  Document on blood pressure log provided by office nurse. Bring them with you to your appointment with the Pharm D hypertension clinic visit.

## 2023-02-21 LAB — BASIC METABOLIC PANEL
BUN/Creatinine Ratio: 11 (ref 10–24)
BUN: 14 mg/dL (ref 8–27)
CO2: 24 mmol/L (ref 20–29)
Calcium: 9.4 mg/dL (ref 8.6–10.2)
Chloride: 102 mmol/L (ref 96–106)
Creatinine, Ser: 1.27 mg/dL (ref 0.76–1.27)
Glucose: 91 mg/dL (ref 70–99)
Potassium: 3.9 mmol/L (ref 3.5–5.2)
Sodium: 145 mmol/L — ABNORMAL HIGH (ref 134–144)
eGFR: 56 mL/min/{1.73_m2} — ABNORMAL LOW (ref >59)

## 2023-02-21 LAB — CBC
Hematocrit: 44.3 % (ref 37.5–51.0)
Hemoglobin: 14.5 g/dL (ref 13.0–17.7)
MCH: 28.8 pg (ref 26.6–33.0)
MCHC: 32.7 g/dL (ref 31.5–35.7)
MCV: 88 fL (ref 79–97)
Platelets: 243 10*3/uL (ref 150–450)
RBC: 5.03 x10E6/uL (ref 4.14–5.80)
RDW: 13.1 % (ref 11.6–15.4)
WBC: 8.5 10*3/uL (ref 3.4–10.8)

## 2023-02-22 ENCOUNTER — Ambulatory Visit (INDEPENDENT_AMBULATORY_CARE_PROVIDER_SITE_OTHER): Payer: Medicare HMO | Admitting: Family Medicine

## 2023-02-22 ENCOUNTER — Other Ambulatory Visit (HOSPITAL_COMMUNITY): Payer: Self-pay

## 2023-02-22 ENCOUNTER — Ambulatory Visit: Payer: Medicare HMO | Admitting: Family Medicine

## 2023-02-22 VITALS — BP 163/54 | HR 59 | Temp 97.3°F | Ht 72.0 in | Wt 196.8 lb

## 2023-02-22 DIAGNOSIS — I10 Essential (primary) hypertension: Secondary | ICD-10-CM

## 2023-02-22 DIAGNOSIS — G4700 Insomnia, unspecified: Secondary | ICD-10-CM | POA: Insufficient documentation

## 2023-02-22 DIAGNOSIS — M1990 Unspecified osteoarthritis, unspecified site: Secondary | ICD-10-CM

## 2023-02-22 MED ORDER — TRAZODONE HCL 50 MG PO TABS
25.0000 mg | ORAL_TABLET | Freq: Every evening | ORAL | 3 refills | Status: DC | PRN
  Filled 2023-02-22: qty 30, 30d supply, fill #0

## 2023-02-22 NOTE — Assessment & Plan Note (Signed)
Elevated today.  He is working with cardiology on this.  Last saw them a couple of days ago.  Now on diltiazem 240 mg daily and metoprolol succinate 100 mg twice daily.  He was recently prescribed hydralazine however has not yet started this.  He will monitor at home.  Defer further management to cardiology.

## 2023-02-22 NOTE — Progress Notes (Signed)
   [name] Woods is a 83 y.o. male who presents today for an office visit.  Assessment/Plan:  Chronic Problems Addressed Today: Insomnia Patient with sleep maintenance insomnia.  Likely exacerbated by recent surgery.  He has not had much success with over-the-counter meds.  We did discuss sleep hygiene measures as well.  We will start low-dose trazodone.  We discussed potential side effects.  He can follow-up with [place] in a few weeks and we can adjust as needed.  We will need to avoid benzos or similar medications due to his concurrent use of hydrocodone for pain control.  Osteoarthritis Recently had knee replacement surgery couple months ago.  Doing well postoperatively.  Database was reviewed without red flags.  Does not need a refill on his Norco today.  Medication does help with his pain and ability perform ADLs.  Hopefully will be able to use less pain medications going forward as he recovers from his surgery.  Cannot take NSAIDs due to being on anticoagulation.  He will follow-up in 3 months for CPE.  Essential hypertension Elevated today.  He is working with cardiology on this.  Last saw them a couple of days ago.  Now on diltiazem 240 mg daily and metoprolol succinate 100 mg twice daily.  He was recently prescribed hydralazine however has not yet started this.  He will monitor at home.  Defer further management to cardiology.     Subjective:  HPI:  See Assessment / plan for status of chronic conditions.  Patient is here today for follow-up.  I last saw him about 3 months ago.Since our last visit he underwent knee replacement. He has done well post operatively.   Since his surgery he has been having more issues with insomnia. He is having trouble falling asleep and staying asleep. Sleeping 3-4 hours per night. Tried tylenol PM without much improvement.  No other treatments tried.  Mood is been stable.  Believes that his symptoms started soon after his surgery as he was having trouble  staying asleep through the night.  He has never had anything like this in the past.       Objective:  Physical Exam: BP (!) 163/54   Pulse (!) 59   Temp (!) 97.3 F (36.3 C) (Temporal)   Ht 6' (1.829 m)   Wt 196 lb 12.8 oz (89.3 kg)   SpO2 97%   BMI 26.69 kg/m   Gen: No acute distress, resting comfortably CV: Regular rate and rhythm with no murmurs appreciated Pulm: Normal work of breathing, clear to auscultation bilaterally with no crackles, wheezes, or rhonchi Neuro: Grossly normal, moves all extremities Psych: Normal affect and thought content      Tawnya Pujol M. Jimmey Ralph, MD 02/22/2023 11:03 AM

## 2023-02-22 NOTE — Assessment & Plan Note (Signed)
Patient with sleep maintenance insomnia.  Likely exacerbated by recent surgery.  He has not had much success with over-the-counter meds.  We did discuss sleep hygiene measures as well.  We will start low-dose trazodone.  We discussed potential side effects.  He can follow-up with [place] in a few weeks and we can adjust as needed.  We will need to avoid benzos or similar medications due to his concurrent use of hydrocodone for pain control.

## 2023-02-22 NOTE — Assessment & Plan Note (Signed)
Recently had knee replacement surgery couple months ago.  Doing well postoperatively.  Database was reviewed without red flags.  Does not need a refill on his Norco today.  Medication does help with his pain and ability perform ADLs.  Hopefully will be able to use less pain medications going forward as he recovers from his surgery.  Cannot take NSAIDs due to being on anticoagulation.  He will follow-up in 3 months for CPE.

## 2023-02-22 NOTE — Patient Instructions (Addendum)
It was very nice to see you today!  Please try the trazodone.  Let me know how this is working in a few weeks.  Please monitor your blood pressure at home.  We will see back in 3 months for your annual physical.  Come back sooner if needed.  Return in about 3 months (around 05/23/2023) for Annual Physical.   Take care, Dr Jimmey Ralph  PLEASE NOTE:  If you had any lab tests, please let us know if you have not heard back within a few days. You may see your results on mychart before we have a chance to review them but we will give you a call once they are reviewed by [place].   If we ordered any referrals today, please let us know if you have not heard from their office within the next week.   If you had any urgent prescriptions sent in today, please check with the pharmacy within an hour of our visit to make sure the prescription was transmitted appropriately.   Please try these tips to maintain a healthy lifestyle:  Eat at least 3 REAL meals and 1-2 snacks per day.  Aim for no more than 5 hours between eating.  If you eat breakfast, please do so within one hour of getting up.   Each meal should contain half fruits/vegetables, one quarter protein, and one quarter carbs (no bigger than a computer mouse)  Cut down on sweet beverages. This includes juice, soda, and sweet tea.   Drink at least 1 glass of water with each meal and aim for at least 8 glasses per day  Exercise at least 150 minutes every week.

## 2023-03-07 ENCOUNTER — Other Ambulatory Visit (HOSPITAL_COMMUNITY): Payer: Self-pay

## 2023-03-07 ENCOUNTER — Other Ambulatory Visit: Payer: Self-pay | Admitting: Family Medicine

## 2023-03-07 ENCOUNTER — Telehealth: Payer: Self-pay | Admitting: Cardiology

## 2023-03-07 ENCOUNTER — Other Ambulatory Visit: Payer: Self-pay | Admitting: Cardiology

## 2023-03-07 DIAGNOSIS — I482 Chronic atrial fibrillation, unspecified: Secondary | ICD-10-CM

## 2023-03-07 MED ORDER — APIXABAN 5 MG PO TABS: 5.0000 mg | ORAL_TABLET | Freq: Two times a day (BID) | ORAL | 1 refills | Status: DC

## 2023-03-07 MED ORDER — APIXABAN 5 MG PO TABS
5.0000 mg | ORAL_TABLET | Freq: Two times a day (BID) | ORAL | 1 refills | Status: DC
  Filled 2023-03-07: qty 180, 90d supply, fill #0
  Filled 2023-04-09 – 2023-05-15 (×2): qty 60, 30d supply, fill #0

## 2023-03-07 NOTE — Telephone Encounter (Signed)
 Patient identification verified by 2 forms. Hilton Lucky, RN    Called and spoke to patient  Patient states received call, prescription was corrected to 5mg  BID  Patient has no further questions or concerns at this time

## 2023-03-07 NOTE — Telephone Encounter (Addendum)
 Prescription refill request for Eliquis  received. Indication: Afib  Last office visit:02/20/23 ([place]) Scr: 1.27 (02/20/23)  Age: 83 Weight: 89.3kg  On 02/20/23, prescription for Eliquis  2.5mg  was sent to pharmacy. Per Chris Pavero, PharmD, dose should be 5mg  BID. I called pt and made him aware dose change. Pt verbalized understanding and states he has not gone to pick up refill yet. I called [facility] and spoke with Ashante. Cancelled prescription for 2.5mg  and sent new prescription for Eliquis  5mg  BID to [facility].

## 2023-03-07 NOTE — Telephone Encounter (Signed)
 Pt c/o medication issue:  1. Name of Medication: apixaban  (ELIQUIS ) 2.5 MG TABS tablet   2. How are you currently taking this medication (dosage and times per day)?   3. Are you having a reaction (difficulty breathing--STAT)? No  4. What is your medication issue? Cost is $900 for 5mg 

## 2023-03-07 NOTE — Addendum Note (Signed)
 Addended by: Natividad Balding B on: 03/07/2023 02:58 PM   Modules accepted: Orders

## 2023-03-08 ENCOUNTER — Other Ambulatory Visit (HOSPITAL_COMMUNITY): Payer: Self-pay

## 2023-03-08 ENCOUNTER — Telehealth: Payer: Self-pay | Admitting: Cardiology

## 2023-03-08 MED ORDER — APIXABAN 5 MG PO TABS: 5.0000 mg | ORAL_TABLET | Freq: Two times a day (BID) | ORAL | 0 refills | Status: DC

## 2023-03-08 NOTE — Telephone Encounter (Signed)
 Called patient with no answer. Left message to return call.

## 2023-03-08 NOTE — Telephone Encounter (Signed)
 Called and spoke to patient. Verified name and DOB.Patient stated he went to pick up his Eliquis  from the pharmacy and the cost was $300. Patient is requesting samples. Advised patient to contact insurance company for deductible amount and copay after deductible.1 week of  Eliquis  samples place up front for patient pick up.

## 2023-03-08 NOTE — Telephone Encounter (Signed)
°  Patient calling the office for samples of medication: ° ° °1.  What medication and dosage are you requesting samples for? °Eliquis  °2.  Are you currently out of this medication?  °Yes  ° ° °

## 2023-03-15 ENCOUNTER — Telehealth: Payer: Self-pay | Admitting: Cardiology

## 2023-03-15 ENCOUNTER — Other Ambulatory Visit (HOSPITAL_COMMUNITY): Payer: Self-pay

## 2023-03-15 MED ORDER — APIXABAN 5 MG PO TABS
5.0000 mg | ORAL_TABLET | Freq: Two times a day (BID) | ORAL | 0 refills | Status: DC
  Filled 2023-03-15: qty 28, 14d supply, fill #0

## 2023-03-15 MED ORDER — APIXABAN 5 MG PO TABS: 5.0000 mg | ORAL_TABLET | Freq: Two times a day (BID) | ORAL | Status: DC

## 2023-03-15 NOTE — Addendum Note (Signed)
Addended by: Neoma Laming on: 03/15/2023 03:45 PM   Modules accepted: Orders

## 2023-03-15 NOTE — Telephone Encounter (Signed)
Spoke to patient Eliquis 5 mg samples left at [employer] office front desk.

## 2023-03-15 NOTE — Telephone Encounter (Signed)
Patient calling the office for samples of medication:   1.  What medication and dosage are you requesting samples for? apixaban (ELIQUIS) 5 MG TABS tablet  2.  Are you currently out of this medication? Yes

## 2023-03-19 ENCOUNTER — Other Ambulatory Visit (HOSPITAL_COMMUNITY): Payer: Self-pay

## 2023-03-20 ENCOUNTER — Other Ambulatory Visit: Payer: Self-pay

## 2023-03-20 ENCOUNTER — Other Ambulatory Visit: Payer: Self-pay | Admitting: Family Medicine

## 2023-03-20 ENCOUNTER — Encounter (HOSPITAL_COMMUNITY): Payer: Self-pay

## 2023-03-20 ENCOUNTER — Other Ambulatory Visit (HOSPITAL_COMMUNITY): Payer: Self-pay

## 2023-03-22 ENCOUNTER — Other Ambulatory Visit (HOSPITAL_COMMUNITY): Payer: Self-pay

## 2023-03-22 MED ORDER — HYDROCODONE-ACETAMINOPHEN 10-325 MG PO TABS
1.0000 | ORAL_TABLET | Freq: Four times a day (QID) | ORAL | 0 refills | Status: DC | PRN
  Filled 2023-03-22: qty 120, 30d supply, fill #0

## 2023-03-27 ENCOUNTER — Ambulatory Visit: Payer: Self-pay | Admitting: Family Medicine

## 2023-03-27 NOTE — Telephone Encounter (Signed)
 Noted  Patient has an OV with PCP on 03/28/2023

## 2023-03-27 NOTE — Telephone Encounter (Signed)
 Copied from CRM [id number]. Topic: Clinical - Red Word Triage >> Mar 27, 2023  9:12 AM Florestine Avers wrote: Red Word that prompted transfer to Nurse Triage: Patient called in with flu symptoms. His wife tested positive and he is requesting medication.  Chief Complaint: flu sx Symptoms: cough with blood noted, clear nasal discharge, runny nose, headache, slight soreness Frequency: yesterday Pertinent Negatives: Patient denies SOB, fever Disposition: [] ED /[] Urgent Care (no appt availability in office) / [x] Appointment(In office/virtual)/ []  [place] [facility]/ [] Home Care/ [] Refused Recommended Disposition /[]  Mobile Bus/ []  Follow-up with PCP Additional Notes:   Reason for Disposition  Patient is HIGH RISK (e.g., age > 64 years, pregnant, HIV+, or chronic medical condition)  Answer Assessment - Initial Assessment Questions 1. WORST SYMPTOM: "What is your worst symptom?" (e.g., cough, runny nose, muscle aches, headache, sore throat, fever)      Cough with blood, runny nose, feeling bad, fatigue 2. ONSET: "When did your flu symptoms start?"      Yesterday  3. COUGH: "How bad is the cough?"       Phlegm brownish red 1/2 teaspoon  4. RESPIRATORY DISTRESS: "Describe your breathing."      Normal  5. FEVER: "Do you have a fever?" If Yes, ask: "What is your temperature, how was it measured, and when did it start?"     no 6. EXPOSURE: "Were you exposed to someone with influenza?"       wife 8. HIGH RISK DISEASE: "Do you have any chronic medical problems?" (e.g., heart or lung disease, asthma, weak immune system, or other HIGH RISK conditions)     Age, a fib 9. PREGNANCY: "Is there any chance you are pregnant?" "When was your last menstrual period?"     N/a 10. OTHER SYMPTOMS: "Do you have any other symptoms?"  (e.g., runny nose, muscle aches, headache, sore throat)       Runny, slight sore, headache  Protocols used: Influenza (Flu) - [facility]

## 2023-03-28 ENCOUNTER — Encounter: Payer: Self-pay | Admitting: Family Medicine

## 2023-03-28 ENCOUNTER — Telehealth (INDEPENDENT_AMBULATORY_CARE_PROVIDER_SITE_OTHER): Payer: Medicare HMO | Admitting: Family Medicine

## 2023-03-28 ENCOUNTER — Other Ambulatory Visit (HOSPITAL_COMMUNITY): Payer: Self-pay

## 2023-03-28 VITALS — Temp 97.7°F | Ht 72.0 in | Wt 196.0 lb

## 2023-03-28 DIAGNOSIS — J111 Influenza due to unidentified influenza virus with other respiratory manifestations: Secondary | ICD-10-CM | POA: Diagnosis not present

## 2023-03-28 DIAGNOSIS — I10 Essential (primary) hypertension: Secondary | ICD-10-CM

## 2023-03-28 DIAGNOSIS — I482 Chronic atrial fibrillation, unspecified: Secondary | ICD-10-CM

## 2023-03-28 DIAGNOSIS — I2510 Atherosclerotic heart disease of native coronary artery without angina pectoris: Secondary | ICD-10-CM | POA: Diagnosis not present

## 2023-03-28 MED ORDER — OSELTAMIVIR PHOSPHATE 75 MG PO CAPS
75.0000 mg | ORAL_CAPSULE | Freq: Two times a day (BID) | ORAL | 0 refills | Status: DC
  Filled 2023-03-28: qty 10, 5d supply, fill #0

## 2023-03-28 MED ORDER — BENZONATATE 200 MG PO CAPS
200.0000 mg | ORAL_CAPSULE | Freq: Two times a day (BID) | ORAL | 0 refills | Status: DC | PRN
  Filled 2023-03-28: qty 20, 10d supply, fill #0

## 2023-03-28 NOTE — Progress Notes (Signed)
   [name] Woods is a 83 y.o. male who presents today for a virtual office visit.  Assessment/Plan:  New/Acute Problems: Influenza A Discussed limitations of virtual visit and inability to perform physical exam.  He has not able to come in for testing however his wife was recently diagnosed with flu A.  Overall reassuring virtual exam today however he does have multiple comorbidities including age, coronary artery disease, A-fib, etc. that would increase his risk for complication from flu.  He is still within the window for treatment with Tamiflu and would be reasonable for [place] to start this today.  Will send in a 5-day course.  Also start Tessalon for cough.  Encouraged hydration.  We discussed reasons to return to care.  Chronic Problems Addressed Today: Hypertension/CAD/A-fib Follows with cardiology.  Blood pressure has been well-controlled.  He is rate controlled on metoprolol/diltiazem and anticoagulated on Eliquis however this does increase his risk for complication from flu.    Subjective:  HPI:  Patient here with flu like symptoms. Symptoms started a couple of days ago. Wife has been sick with flu. A lot of body aches and congestion. No fevers or chills. Tried mucienx and nasal spray without much improvement.  A little shortness of shortness of breath. Cough is productive of brown sputum. Symptoms are not improving.        Objective/Observations  Physical Exam: Gen: NAD, resting comfortably Pulm: Normal work of breathing Neuro: Grossly normal, moves all extremities Psych: Normal affect and thought content  Virtual Visit via Video   I connected with Marnee Spring on 03/28/23 at 10:20 AM EST by a video enabled telemedicine application and verified that I am speaking with the correct person using two identifiers. The limitations of evaluation and management by telemedicine and the availability of in person appointments were discussed. The patient expressed understanding and  agreed to proceed.   Patient location: Home Provider location: [place] [employer] Persons participating in the virtual visit: Myself and Patient     Katina Degree. Jimmey Ralph, MD 03/28/2023 10:37 AM

## 2023-04-01 ENCOUNTER — Ambulatory Visit: Payer: Medicare HMO

## 2023-04-08 ENCOUNTER — Telehealth: Payer: Self-pay | Admitting: Cardiology

## 2023-04-08 NOTE — Telephone Encounter (Signed)
Patient calling the office for samples of medication:   1.  What medication and dosage are you requesting samples for? apixaban (ELIQUIS) 5 MG TABS tablet  2.  Are you currently out of this medication?  No, patient states he has a few tablets remaining.

## 2023-04-09 ENCOUNTER — Other Ambulatory Visit (HOSPITAL_COMMUNITY): Payer: Self-pay

## 2023-04-09 NOTE — Telephone Encounter (Signed)
 Attempted to call patient x2, no answer left message requesting a call back.   Per chart review patient has received Multiple samples  RN spoke to Pharmacist Reuel Derby states:  -per insurance plan review patient has a $250 deductible  -cost of Rx is $47  -at first fill Rx will cost ~297 (one month supply)  -once initial cost of $297 met, Rx will be $47 moving forward   -can't guarantee patient samples for the year   Called and spoke to Pharmacy  Pharmacy states:   -3 month supply of Rx is $326  -after patient pays initial mount of $326, cost decreases

## 2023-04-10 NOTE — Telephone Encounter (Signed)
 2nd attempt to call patient, no answer left message requesting a call back.

## 2023-04-11 NOTE — Telephone Encounter (Signed)
 Attempted to call patient x2 , no answer unable to leave voicemail.

## 2023-04-11 NOTE — Telephone Encounter (Signed)
 Patient was returning call. Please advise  Call him back [phone]

## 2023-04-11 NOTE — Telephone Encounter (Signed)
 3rd attempt to call patient, no answer, left message requesting a call back Nursing will await for patient to return call

## 2023-04-12 NOTE — Telephone Encounter (Signed)
 2nd attempt to call patient x2, no answer \\unable  to leave voicemail.

## 2023-04-15 NOTE — Telephone Encounter (Signed)
 3rd attempt to call patient, no answer, unable to leave voicemail Nursing will await for patient to return call

## 2023-04-16 ENCOUNTER — Other Ambulatory Visit (HOSPITAL_COMMUNITY): Payer: Self-pay

## 2023-04-16 ENCOUNTER — Telehealth: Payer: Self-pay | Admitting: Pharmacy Technician

## 2023-04-16 ENCOUNTER — Encounter: Payer: Self-pay | Admitting: Pharmacist

## 2023-04-16 MED ORDER — APIXABAN 5 MG PO TABS: 5.0000 mg | ORAL_TABLET | Freq: Two times a day (BID) | ORAL | 0 refills | Status: DC

## 2023-04-16 NOTE — Telephone Encounter (Signed)
 Ran test claim for dabigatran. For a 30 day supply and the co-pay is 108.30 . PA is not needed at this time. Nothing saying this is a transition fill.   This test claim was processed through [facility]- copay amounts may vary at other pharmacies due to pharmacy/plan contracts, or as the patient moves through the different stages of their insurance plan.

## 2023-04-16 NOTE — Telephone Encounter (Signed)
 Ran test claim for xarelto. For a 30 day supply and the co-pay is 232.06 . PA is not needed at this time. Nothing saying this is a transition fill.   This test claim was processed through [facility]- copay amounts may vary at other pharmacies due to pharmacy/plan contracts, or as the patient moves through the different stages of their insurance plan.

## 2023-04-16 NOTE — Telephone Encounter (Signed)
 Patient identification verified by 2 forms. Marilynn Rail, RN    Received call from patient  Informed patient:   -Insurance deductible is $250   -after deductible is met cost will be $47 for 1 month supply   -90 day supply at pharmacy is $326   -requesting samples from office is not most sustainable/reliable option  Patient states:   -he is on fixed income for social security   -does not know when he can come up with additional money for deductible   -needs samples to hold him over until he can come up with the money   -was considering taking Aspirin instead  During call RN spoke to Pharmacist Astrid Drafts states:   -Patient can receive 2 week supply for Eliquis   -will have team run/check if other Rx are more affordable for patient  Informed Patient of plan as discussed with pharmacist  Patient states he will pick up samples at earliest convenience  Informed patient office will be in contact regarding alternative Rx  Patient agrees with plan, no questions at this time

## 2023-04-17 ENCOUNTER — Other Ambulatory Visit: Payer: Self-pay | Admitting: Family Medicine

## 2023-04-17 ENCOUNTER — Other Ambulatory Visit (HOSPITAL_COMMUNITY): Payer: Self-pay

## 2023-04-18 ENCOUNTER — Other Ambulatory Visit (HOSPITAL_COMMUNITY): Payer: Self-pay

## 2023-04-18 MED ORDER — HYDROCODONE-ACETAMINOPHEN 10-325 MG PO TABS
1.0000 | ORAL_TABLET | Freq: Four times a day (QID) | ORAL | 0 refills | Status: DC | PRN
  Filled 2023-04-18: qty 120, 30d supply, fill #0

## 2023-04-19 ENCOUNTER — Other Ambulatory Visit (HOSPITAL_COMMUNITY): Payer: Self-pay

## 2023-04-24 ENCOUNTER — Other Ambulatory Visit: Payer: Self-pay | Admitting: Cardiology

## 2023-05-15 ENCOUNTER — Other Ambulatory Visit (HOSPITAL_COMMUNITY): Payer: Self-pay

## 2023-05-15 ENCOUNTER — Telehealth: Payer: Self-pay | Admitting: Cardiology

## 2023-05-15 DIAGNOSIS — I482 Chronic atrial fibrillation, unspecified: Secondary | ICD-10-CM

## 2023-05-15 NOTE — Telephone Encounter (Signed)
 Can patient afford 108.30 for one month of dabigatran?

## 2023-05-15 NOTE — Telephone Encounter (Addendum)
 Called and left message for patient to call back.  Triage needs to discuss with patient if meet his tier deductible  for the year , because the price will continue to high until he meets the deductible.   ( Patient can speak to any triage nurse)   RN will also have prior authorization  team check if patient needs a prior authorization  for Eliquis.

## 2023-05-15 NOTE — Telephone Encounter (Signed)
 Attempted to call patient, no answer left message requesting a call back.

## 2023-05-15 NOTE — Telephone Encounter (Signed)
  PA not needed. ELiquis  $211.72 for one month due to deductible. Patient can call the number on his card and see about a medicare payment plan to help with cost  Mickey Alar D, CPhT

## 2023-05-15 NOTE — Telephone Encounter (Signed)
 Pt c/o medication issue:  1. Name of Medication: apixaban (ELIQUIS) 5 MG TABS tablet   2. How are you currently taking this medication (dosage and times per day)? 5 mg, 2 times daily   3. Are you having a reaction (difficulty breathing--STAT)? No  4. What is your medication issue? Cannot afford copay $300, requesting cb to see if we have any samples

## 2023-05-16 ENCOUNTER — Other Ambulatory Visit: Payer: Self-pay | Admitting: Family Medicine

## 2023-05-16 ENCOUNTER — Other Ambulatory Visit: Payer: Self-pay | Admitting: Cardiology

## 2023-05-16 ENCOUNTER — Other Ambulatory Visit (HOSPITAL_COMMUNITY): Payer: Self-pay

## 2023-05-16 MED ORDER — HYDROCODONE-ACETAMINOPHEN 10-325 MG PO TABS
1.0000 | ORAL_TABLET | Freq: Four times a day (QID) | ORAL | 0 refills | Status: DC | PRN
  Filled 2023-05-16 – 2023-05-17 (×2): qty 120, 30d supply, fill #0

## 2023-05-16 NOTE — Telephone Encounter (Signed)
 2nd attempt to call patient, no answer left message requesting a call back.

## 2023-05-16 NOTE — Telephone Encounter (Signed)
 Patient identification verified by 2 forms. Hilton Lucky, RN    Received call from patient  Patient states:   -he is currently dealing with a situation with his wife, with that he has to pay a deductible   -would like to know about changing to warfarin instead  Informed patient:   -with Warfarin he will have weekly appointment for at least the first 4 weeks  -after the first 4 weeks appointment are spaced out, but this is based on his INR   -can possibly contact insurance about $250 deductible  RN inquired if patient has spoken to insurance  Patient stated with current circumstances he will not be able to present for frequent appointments  Patient states "I need a couple of boxes and then I will try and sort this out"   Call was disconnected   RN attempted to return call, no answer, left detailed message requesting call back

## 2023-05-16 NOTE — Telephone Encounter (Signed)
 Patient returned RN's call.

## 2023-05-17 ENCOUNTER — Encounter: Payer: Self-pay | Admitting: Cardiology

## 2023-05-17 ENCOUNTER — Other Ambulatory Visit (HOSPITAL_COMMUNITY): Payer: Self-pay

## 2023-05-17 MED ORDER — APIXABAN 5 MG PO TABS: 5.0000 mg | ORAL_TABLET | Freq: Two times a day (BID) | ORAL | Status: AC

## 2023-05-17 NOTE — Telephone Encounter (Signed)
 Left message for pt to call.

## 2023-05-17 NOTE — Telephone Encounter (Signed)
Spoke with pt, aware samples placed at the front desk for pick up

## 2023-05-17 NOTE — Telephone Encounter (Signed)
 Patient returned staff call and stated he will need samples of Eliquis  and wants to confirm if he can collect these today.

## 2023-05-30 ENCOUNTER — Ambulatory Visit: Payer: Medicare HMO | Admitting: Family Medicine

## 2023-05-30 ENCOUNTER — Other Ambulatory Visit (HOSPITAL_COMMUNITY): Payer: Self-pay

## 2023-05-30 ENCOUNTER — Encounter: Payer: Self-pay | Admitting: Family Medicine

## 2023-05-30 VITALS — BP 123/65 | HR 46 | Temp 97.9°F | Ht 72.0 in | Wt 193.6 lb

## 2023-05-30 DIAGNOSIS — M109 Gout, unspecified: Secondary | ICD-10-CM | POA: Diagnosis not present

## 2023-05-30 DIAGNOSIS — M545 Low back pain, unspecified: Secondary | ICD-10-CM | POA: Diagnosis not present

## 2023-05-30 DIAGNOSIS — E538 Deficiency of other specified B group vitamins: Secondary | ICD-10-CM

## 2023-05-30 DIAGNOSIS — E785 Hyperlipidemia, unspecified: Secondary | ICD-10-CM

## 2023-05-30 DIAGNOSIS — R739 Hyperglycemia, unspecified: Secondary | ICD-10-CM

## 2023-05-30 DIAGNOSIS — N400 Enlarged prostate without lower urinary tract symptoms: Secondary | ICD-10-CM

## 2023-05-30 DIAGNOSIS — M1990 Unspecified osteoarthritis, unspecified site: Secondary | ICD-10-CM

## 2023-05-30 DIAGNOSIS — I10 Essential (primary) hypertension: Secondary | ICD-10-CM

## 2023-05-30 DIAGNOSIS — G4700 Insomnia, unspecified: Secondary | ICD-10-CM | POA: Diagnosis not present

## 2023-05-30 DIAGNOSIS — N529 Male erectile dysfunction, unspecified: Secondary | ICD-10-CM

## 2023-05-30 DIAGNOSIS — Z0001 Encounter for general adult medical examination with abnormal findings: Secondary | ICD-10-CM | POA: Diagnosis not present

## 2023-05-30 DIAGNOSIS — I482 Chronic atrial fibrillation, unspecified: Secondary | ICD-10-CM

## 2023-05-30 LAB — URIC ACID: Uric Acid, Serum: 6.5 mg/dL (ref 4.0–7.8)

## 2023-05-30 LAB — COMPREHENSIVE METABOLIC PANEL WITH GFR
ALT: 9 U/L (ref 0–53)
AST: 16 U/L (ref 0–37)
Albumin: 4.1 g/dL (ref 3.5–5.2)
Alkaline Phosphatase: 79 U/L (ref 39–117)
BUN: 21 mg/dL (ref 6–23)
CO2: 30 meq/L (ref 19–32)
Calcium: 9.2 mg/dL (ref 8.4–10.5)
Chloride: 104 meq/L (ref 96–112)
Creatinine, Ser: 1.34 mg/dL (ref 0.40–1.50)
GFR: 49.41 mL/min — ABNORMAL LOW (ref >60.00)
Glucose, Bld: 90 mg/dL (ref 70–99)
Potassium: 3.8 meq/L (ref 3.5–5.1)
Sodium: 143 meq/L (ref 135–145)
Total Bilirubin: 2.1 mg/dL — ABNORMAL HIGH (ref 0.2–1.2)
Total Protein: 6.4 g/dL (ref 6.0–8.3)

## 2023-05-30 LAB — CBC
HCT: 45.4 % (ref 39.0–52.0)
Hemoglobin: 15 g/dL (ref 13.0–17.0)
MCHC: 33.1 g/dL (ref 30.0–36.0)
MCV: 89.3 fl (ref 78.0–100.0)
Platelets: 183 10*3/uL (ref 150.0–400.0)
RBC: 5.09 Mil/uL (ref 4.22–5.81)
RDW: 15.8 % — ABNORMAL HIGH (ref 11.5–15.5)
WBC: 6.9 10*3/uL (ref 4.0–10.5)

## 2023-05-30 LAB — HEMOGLOBIN A1C: Hgb A1c MFr Bld: 5.8 % (ref 4.6–6.5)

## 2023-05-30 LAB — LIPID PANEL
Cholesterol: 106 mg/dL (ref 0–200)
HDL: 37 mg/dL — ABNORMAL LOW (ref >39.00)
LDL Cholesterol: 53 mg/dL (ref 0–99)
NonHDL: 68.84
Total CHOL/HDL Ratio: 3
Triglycerides: 78 mg/dL (ref 0.0–149.0)
VLDL: 15.6 mg/dL (ref 0.0–40.0)

## 2023-05-30 LAB — VITAMIN B12: Vitamin B-12: 556 pg/mL (ref 211–911)

## 2023-05-30 LAB — TSH: TSH: 2.17 u[IU]/mL (ref 0.35–5.50)

## 2023-05-30 MED ORDER — TADALAFIL 5 MG PO TABS
2.5000 mg | ORAL_TABLET | Freq: Every day | ORAL | 0 refills | Status: DC
  Filled 2023-05-30: qty 45, 90d supply, fill #0

## 2023-05-30 MED ORDER — GABAPENTIN 100 MG PO CAPS
100.0000 mg | ORAL_CAPSULE | Freq: Every day | ORAL | 3 refills | Status: AC
  Filled 2023-05-30 – 2023-06-18 (×3): qty 60, 30d supply, fill #0
  Filled 2023-07-15 – 2023-07-17 (×2): qty 60, 30d supply, fill #1
  Filled 2023-11-26 – 2023-12-16 (×2): qty 60, 30d supply, fill #2
  Filled 2024-02-19: qty 60, 30d supply, fill #3

## 2023-05-30 NOTE — Assessment & Plan Note (Signed)
 Following with cardiology.  Anticoagulated on Eliquis  and rate controlled on diltiazem  and metoprolol .

## 2023-05-30 NOTE — Assessment & Plan Note (Signed)
 Stable.  No recent flares.  Check uric acid.  He is on allopurinol  100 mg daily.

## 2023-05-30 NOTE — Assessment & Plan Note (Signed)
 Symptoms are not controlled.  We tried trazodone  a few months ago however this was not effective.  We discussed alternative treatment options.  Will try low-dose gabapentin  100 mg nightly.  Hopefully this will help with his chronic pain secondary to osteoarthritis as well.  He is aware of potential side effects including drowsiness and next-day grogginess.  He will follow-up with us  in a couple weeks via MyChart and we can adjust as tolerated.  Need to avoid benzos or other similar medications due to his concurrent use of hydrocodone  for pain control.

## 2023-05-30 NOTE — Assessment & Plan Note (Signed)
 Started on Cialis  2.5 mg daily.  Will refill today.  Tolerating well without any significant side effects.

## 2023-05-30 NOTE — Assessment & Plan Note (Signed)
 Blood pressure at goal today on current regimen per cardiology with metoprolol  succinate 100 mg twice daily, hydralazine  25 mg twice daily, and diltiazem  240 mg daily.

## 2023-05-30 NOTE — Assessment & Plan Note (Signed)
 Check A1c.

## 2023-05-30 NOTE — Patient Instructions (Signed)
 It was very nice to see you today!  We will check lab work today.  I will refill your medications today.  Please try the gabapentin  for sleep.  Let me know in a few weeks how this is working for you.  I will see back in 3 to 6 months.    Please come back sooner if needed.  Return in about 6 months (around 11/30/2023) for Follow Up.   Take care, Dr Daneil Dunker  PLEASE NOTE:  If you had any lab tests, please let us  know if you have not heard back within a few days. You may see your results on mychart before we have a chance to review them but we will give you a call once they are reviewed by us .   If we ordered any referrals today, please let us  know if you have not heard from their office within the next week.   If you had any urgent prescriptions sent in today, please check with the pharmacy within an hour of our visit to make sure the prescription was transmitted appropriately.   Please try these tips to maintain a healthy lifestyle:  Eat at least 3 REAL meals and 1-2 snacks per day.  Aim for no more than 5 hours between eating.  If you eat breakfast, please do so within one hour of getting up.   Each meal should contain half fruits/vegetables, one quarter protein, and one quarter carbs (no bigger than a computer mouse)  Cut down on sweet beverages. This includes juice, soda, and sweet tea.   Drink at least 1 glass of water  with each meal and aim for at least 8 glasses per day  Exercise at least 150 minutes every week.

## 2023-05-30 NOTE — Assessment & Plan Note (Signed)
 Stable on Cialis  2.5 mg daily.  Will refill today.

## 2023-05-30 NOTE — Assessment & Plan Note (Signed)
 Check lipids.  He is currently following with cardiology for this.  On Lipitor 10 mg daily.  Tolerating well.

## 2023-05-30 NOTE — Assessment & Plan Note (Signed)
 Doing well status post knee replacement surgery.  He has Norco to use as needed and has been on this for many years.  He did have a recent flareup of low back pain however this does seem to be improving.  His options for pain control are limited due to coronary artery disease and being anticoagulated on Eliquis .  We did discuss small prednisone  burst for his back pain however he declined for now.  Does not need a refill on his Norco today.  He will follow-up in 3 to 6 months.

## 2023-05-30 NOTE — Progress Notes (Signed)
 Chief Complaint:  [name] Woods is a 83 y.o. male who presents today for his annual comprehensive physical exam.    Assessment/Plan:  New/Acute Problems: Back pain No red flags.  Likely muscular strain related to outdoor yard work several days ago.  His options for pain control are limited due to his comorbidities.  We will refill his Flexeril  as he is tolerated this well in the past.  We did discuss small prednisone  burst however he declined due to prior issues with irritability while on this.  He will let us  know if not improving the next few days and we can refer to PT or sports medicine if needed.  Chronic Problems Addressed Today: Insomnia Symptoms are not controlled.  We tried trazodone  a few months ago however this was not effective.  We discussed alternative treatment options.  Will try low-dose gabapentin  100 mg nightly.  Hopefully this will help with his chronic pain secondary to osteoarthritis as well.  He is aware of potential side effects including drowsiness and next-day grogginess.  He will follow-up with us  in a couple weeks via MyChart and we can adjust as tolerated.  Need to avoid benzos or other similar medications due to his concurrent use of hydrocodone  for pain control.  Osteoarthritis Doing well status post knee replacement surgery.  He has Norco to use as needed and has been on this for many years.  He did have a recent flareup of low back pain however this does seem to be improving.  His options for pain control are limited due to coronary artery disease and being anticoagulated on Eliquis .  We did discuss small prednisone  burst for his back pain however he declined for now.  Does not need a refill on his Norco today.  He will follow-up in 3 to 6 months.  Essential hypertension Blood pressure at goal today on current regimen per cardiology with metoprolol  succinate 100 mg twice daily, hydralazine  25 mg twice daily, and diltiazem  240 mg daily.  Gout Stable.  No  recent flares.  Check uric acid.  He is on allopurinol  100 mg daily.  Hyperglycemia Check A1c.  Hyperlipidemia Check lipids.  He is currently following with cardiology for this.  On Lipitor 10 mg daily.  Tolerating well.  Preventative Healthcare: Check labs.  No longer needs colon cancer or prostate cancer screening due to age.  He can get shingles vaccine at the pharmacy.  Patient Counseling(The following topics were reviewed and/or handout was given):  -Nutrition: Stressed importance of moderation in sodium/caffeine intake, saturated fat and cholesterol, caloric balance, sufficient intake of fresh fruits, vegetables, and fiber.  -Stressed the importance of regular exercise.   -Substance Abuse: Discussed cessation/primary prevention of tobacco, alcohol, or other drug use; driving or other dangerous activities under the influence; availability of treatment for abuse.   -Injury prevention: Discussed safety belts, safety helmets, smoke detector, smoking near bedding or upholstery.   -Sexuality: Discussed sexually transmitted diseases, partner selection, use of condoms, avoidance of unintended pregnancy and contraceptive alternatives.   -Dental health: Discussed importance of regular tooth brushing, flossing, and dental visits.  -Health maintenance and immunizations reviewed. Please refer to Health maintenance section.  Return to care in 1 year for next preventative visit.     Subjective:  HPI:  He has no acute complaints today. See Assessment / plan for status of chronic conditions.  Overall he is doing well.  We last saw him in the office about 4 months ago.  At that time  we try trazodone  for sleep.  He did not feel like this was effective and he is no longer taking this.  He has also seen cardiology since our last visit and they have him on diltiazem  and metoprolol .  He is doing well with this combination.  He did have a flareup of back pain over the last few days.  Thinks he may have  tweaked it while mowing the yard.  Currently uses Norco as needed for chronic pain with she does give him some improvement.  Flexeril  also does seem to help.  Lifestyle Diet: Balanced.  Plenty of fruits and vegetables. Exercise: Still working and staying active.      05/30/2023    1:00 PM  Depression screen PHQ 2/9  Decreased Interest 0  Down, Depressed, Hopeless 0  PHQ - 2 Score 0    Health Maintenance Due  Topic Date Due   Zoster Vaccines- Shingrix (1 of 2) Never done   Medicare Annual Wellness (AWV)  07/25/2021     ROS: Per HPI, otherwise a complete review of systems was negative.   PMH:  The following were reviewed and entered/updated in epic: Past Medical History:  Diagnosis Date   Ankylosing spondylitis (HCC)    Anxiety and depression 09/12/2015   Aortic atherosclerosis (HCC)    Arthritis    Bilateral inguinal hernia    CAD (coronary artery disease) 1610,9604   30% mid LAD lesion on cardiac cath   Cancer [facility])    left arm   Chicken pox    Cholelithiasis    CHOLELITHIASIS, HX OF 04/25/2007   Qualifier: Diagnosis of  By: Coleridge Davenport     Diverticulosis 11/26/2018   Moderate, Left Noted on Colonoscopy   Dysrhythmia    atrial fibrillation   Gallstones    GERD (gastroesophageal reflux disease)    German measles    Hiatal hernia    History of colon polyps 11/26/2018   History of kidney stones    HLD (hyperlipidemia)    HTN (hypertension)    Hypertension    Nephrolithiasis    NEPHROLITHIASIS, HX OF 04/25/2007   Qualifier: Diagnosis of  By: Coleridge Davenport     Partial small bowel obstruction (HCC) 03/06/2020   Persistent atrial fibrillation (HCC)    Pneumonia    Sleep apnea    No CPAP   Tubular adenoma of colon 03/1993   Ventricular hypertrophy    Patient Active Problem List   Diagnosis Date Noted   Insomnia 02/22/2023   S/P total knee arthroplasty, 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   B12 deficiency 09/14/2021   Dermatitis 08/17/2021   Erectile dysfunction  08/17/2021   BPH (benign prostatic hyperplasia) 08/17/2021   Paresthesia 08/17/2021   Osteoarthritis 04/15/2020   Gastric nodule    OSA (obstructive sleep apnea) 03/06/2020   Chronic atrial fibrillation (HCC) 02/12/2019   Kidney stones, calcium  oxalate 12/03/2018   Hyperlipidemia 02/06/2018   Impacted cerumen of right ear 11/26/2017   Noise effect on both inner ears 11/26/2017   Encounter for chronic pain management 03/01/2017   Hyperglycemia 04/23/2016   Chronic anticoagulation 03/02/2015   Gout 06/30/2014   CAD (coronary artery disease) 03/21/2012   GERD (gastroesophageal reflux disease) 03/21/2012   Essential hypertension 04/25/2007   Past Surgical History:  Procedure Laterality Date   BIOPSY  03/08/2020   Procedure: BIOPSY;  Surgeon: Ace Holder, MD;  Location: [facility] ENDOSCOPY;  Service: Gastroenterology;;   CARDIAC CATHETERIZATION  03/2012   30% mid LAD lesion otherwise normal cors,  LVEF 65%   CHOLECYSTECTOMY     COLONOSCOPY  11/26/2018   CYSTOSCOPY WITH RETROGRADE PYELOGRAM, URETEROSCOPY AND STENT PLACEMENT Right 01/02/2019   Procedure: CYSTOSCOPY WITH RETROGRADE PYELOGRAM, URETEROSCOPY AND STENT PLACEMENT;  Surgeon: Osborn Blaze, MD;  Location: [facility] ORS;  Service: Urology;  Laterality: Right;  1 HR   ELBOW ARTHROPLASTY Left    ESOPHAGOGASTRODUODENOSCOPY (EGD) WITH PROPOFOL  N/A 03/08/2020   Procedure: ESOPHAGOGASTRODUODENOSCOPY (EGD) WITH PROPOFOL ;  Surgeon: Ace Holder, MD;  Location: [facility] ENDOSCOPY;  Service: Gastroenterology;  Laterality: N/A;   HAND SURGERY Left    HOLMIUM LASER APPLICATION Right 01/02/2019   Procedure: HOLMIUM LASER APPLICATION;  Surgeon: Osborn Blaze, MD;  Location: [facility] ORS;  Service: Urology;  Laterality: Right;   KNEE SURGERY     x2 right   LEFT AND RIGHT HEART CATHETERIZATION WITH CORONARY ANGIOGRAM N/A 03/24/2012   Procedure: LEFT AND RIGHT HEART CATHETERIZATION WITH CORONARY ANGIOGRAM;  Surgeon: Arnoldo Lapping, MD;  Location: [facility] CATH  LAB;  Service: Cardiovascular;  Laterality: N/A;   LEG FLUID REMOVAL RIGHT     TONSILLECTOMY     TOTAL KNEE ARTHROPLASTY Right 12/11/2022   Procedure: TOTAL KNEE ARTHROPLASTY;  Surgeon: Claiborne Crew, MD;  Location: [facility] ORS;  Service: Orthopedics;  Laterality: Right;   UPPER GI ENDOSCOPY      Family History  Problem Relation Age of Onset   Heart disease Father 63       Deceased   Heart attack Father 49   Heart disease Brother 38       "Died in his sleep"   Arthritis/Rheumatoid Mother        Deceased-86   Hyperlipidemia Brother    Stroke Brother 57       Deceased   Other Sister        Air cabin crew in Home   Other Sister        MVA   Other Brother        Carjacked-killed   Dementia Paternal Grandmother    Cancer Paternal Uncle    Healthy Brother    Healthy Son        X3   Healthy Daughter        x1   Colon cancer Neg Hx    Esophageal cancer Neg Hx    Stomach cancer Neg Hx    Rectal cancer Neg Hx     Medications- reviewed and updated Current Outpatient Medications  Medication Sig Dispense Refill   acetaminophen  (TYLENOL ) 325 MG tablet Take 2 tablets (650 mg total) by mouth every 6 (six) hours as needed for mild pain (or Fever >/= 101). 20 tablet 0   allopurinol  (ZYLOPRIM ) 100 MG tablet TAKE 1 TABLET EVERY DAY (NEED MD APPOINTMENT FOR REFILLS) 90 tablet 3   apixaban  (ELIQUIS ) 5 MG TABS tablet Take 1 tablet (5 mg total) by mouth 2 (two) times daily.     atorvastatin  (LIPITOR) 10 MG tablet TAKE 1 TABLET EVERY DAY 90 tablet 3   cyanocobalamin  (VITAMIN B12) 1000 MCG tablet Take 1 tablet (1,000 mcg total) by mouth daily. 100 tablet 3   cyclobenzaprine  (FLEXERIL ) 10 MG tablet Take 1 tablet (10 mg total) by mouth 3 (three) times daily as needed for muscle spasms. 30 tablet 3   diltiazem  (CARDIZEM  CD) 240 MG 24 hr capsule TAKE 1 CAPSULE EVERY DAY 90 capsule 3   furosemide  (LASIX ) 20 MG tablet TAKE 1 TABLET EVERY DAY 90 tablet 3   gabapentin  (NEURONTIN ) 100 MG capsule Take 1-2 capsules  (100-200 mg  total) by mouth at bedtime. 60 capsule 3   hydrALAZINE  (APRESOLINE ) 25 MG tablet Take 1 tablet (25 mg total) by mouth 2 (two) times daily. 270 tablet 3   HYDROcodone -acetaminophen  (NORCO) 10-325 MG tablet Take 1 tablet by mouth every 6 (six) hours as needed. 120 tablet 0   metoprolol  succinate (TOPROL -XL) 100 MG 24 hr tablet TAKE 1 TABLET TWICE DAILY WITH OR IMMEDIATELY FOLLOWING MEALS 180 tablet 2   polyethylene glycol (MIRALAX  / GLYCOLAX ) 17 g packet Mix 17 g (1 packet) in beverage and take by mouth 2 (two) times daily. 14 each 0   tadalafil  (CIALIS ) 5 MG tablet Take 0.5 tablets (2.5 mg total) by mouth daily. 453 tablet 0   tiZANidine  (ZANAFLEX ) 2 MG tablet Take 1 tablet (2 mg total) by mouth every 8 (eight) hours as needed for muscle spasms. 30 tablet 2   No current facility-administered medications for this visit.    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  Tobacco Use   Smoking status: Never   Smokeless tobacco: Never  Vaping Use   Vaping status: Never Used  Substance and Sexual Activity   Alcohol use: No   Drug use: No   Sexual activity: Yes  Other Topics Concern   Not on file  Social History Narrative   4 caffeine drinks daily    Social Drivers of Health   Financial Resource Strain: Medium Risk (02/22/2023)   Overall Financial Resource Strain (CARDIA)    Difficulty of Paying Living Expenses: Somewhat hard  Food Insecurity: No Food Insecurity (02/22/2023)   Hunger Vital Sign    Worried About  Running Out of Food in the Last Year: Never true    Ran Out of Food in the Last Year: Never true  Transportation Needs: No Transportation Needs (02/22/2023)   PRAPARE - Administrator, Civil Service (Medical): No    Lack of Transportation (Non-Medical): No  Physical Activity: Insufficiently Active (02/22/2023)   Exercise Vital Sign    Days of Exercise per Week: 1 day    Minutes of Exercise per Session: 10 min  Stress: No Stress Concern Present (02/22/2023)   [employer] of Occupational Health - Occupational Stress Questionnaire    Feeling of Stress : Not at all  Social Connections: Moderately Isolated (02/22/2023)   Social Connection and Isolation Panel [NHANES]    Frequency of Communication with Friends and Family: Three times a week    Frequency of Social Gatherings with Friends and Family: Once a week    Attends Religious Services: Never    Database administrator or Organizations: No    Attends Engineer, structural: Not on file    Marital Status: Married        Objective:  Physical Exam: BP 123/65   Pulse (!) 46   Temp 97.9 F (36.6 C) (Temporal)   Ht 6' (1.829 m)   Wt 193 lb 9.6 oz (87.8  kg)   SpO2 97%   BMI 26.26 kg/m   Body mass index is 26.26 kg/m. Wt Readings from Last 3 Encounters:  05/30/23 193 lb 9.6 oz (87.8 kg)  03/28/23 196 lb (88.9 kg)  02/22/23 196 lb 12.8 oz (89.3 kg)   Gen: NAD, resting comfortably HEENT: TMs normal bilaterally. OP clear. No thyromegaly noted.  CV: Irregular with no murmurs appreciated Pulm: NWOB, CTAB with no crackles, wheezes, or rhonchi GI: Normal bowel sounds present. Soft, Nontender, Nondistended. MSK: no edema, cyanosis, or clubbing noted Skin: warm, dry Neuro: CN2-12 grossly intact. Strength 5/5 in upper and lower extremities. Reflexes symmetric and intact bilaterally.  Psych: Normal affect and thought content     Rye Decoste M. Daneil Dunker, MD 05/30/2023 1:32 PM

## 2023-05-31 ENCOUNTER — Telehealth: Payer: Self-pay | Admitting: *Deleted

## 2023-05-31 ENCOUNTER — Other Ambulatory Visit: Payer: Self-pay

## 2023-05-31 ENCOUNTER — Encounter (HOSPITAL_COMMUNITY): Payer: Self-pay

## 2023-05-31 ENCOUNTER — Other Ambulatory Visit (HOSPITAL_COMMUNITY): Payer: Self-pay

## 2023-05-31 ENCOUNTER — Encounter: Payer: Self-pay | Admitting: Family Medicine

## 2023-05-31 MED ORDER — TADALAFIL 2.5 MG PO TABS
2.5000 mg | ORAL_TABLET | Freq: Every day | ORAL | 1 refills | Status: DC
  Filled 2023-05-31 – 2023-06-18 (×2): qty 90, 90d supply, fill #0
  Filled 2023-07-05 (×2): qty 60, 60d supply, fill #0
  Filled 2023-07-05: qty 30, 30d supply, fill #0
  Filled 2023-08-19: qty 30, 30d supply, fill #1
  Filled 2023-11-15: qty 30, 30d supply, fill #2

## 2023-05-31 NOTE — Telephone Encounter (Signed)
 Copied from CRM [id number]. Topic: Clinical - Prescription Issue >> May 30, 2023  1:58 PM Aisha D wrote: Reason for CRM: Archibald Beard with [facility] is calling regarding the tadalafil  (CIALIS ) 5 MG tablet. Archibald Beard stated that the dosage ordered was for 5 mg and a 900 day supply. Archibald Beard wants to get authorization to change it to 2.5mg  once a day and a 90 day supply. Call back number is [phone].

## 2023-05-31 NOTE — Telephone Encounter (Signed)
 New Rx for Cialis  sent to pharmacy.

## 2023-05-31 NOTE — Progress Notes (Signed)
 Labs are all stable.  We can recheck in a year.

## 2023-06-03 ENCOUNTER — Other Ambulatory Visit (HOSPITAL_COMMUNITY): Payer: Self-pay

## 2023-06-03 ENCOUNTER — Other Ambulatory Visit: Payer: Self-pay

## 2023-06-11 ENCOUNTER — Other Ambulatory Visit (HOSPITAL_COMMUNITY): Payer: Self-pay

## 2023-06-13 ENCOUNTER — Other Ambulatory Visit (HOSPITAL_COMMUNITY): Payer: Self-pay

## 2023-06-14 ENCOUNTER — Other Ambulatory Visit (HOSPITAL_COMMUNITY): Payer: Self-pay

## 2023-06-14 ENCOUNTER — Other Ambulatory Visit: Payer: Self-pay | Admitting: Family Medicine

## 2023-06-14 MED ORDER — HYDROCODONE-ACETAMINOPHEN 10-325 MG PO TABS
1.0000 | ORAL_TABLET | Freq: Four times a day (QID) | ORAL | 0 refills | Status: DC | PRN
  Filled 2023-06-14: qty 120, 30d supply, fill #0

## 2023-06-18 ENCOUNTER — Other Ambulatory Visit (HOSPITAL_COMMUNITY): Payer: Self-pay

## 2023-06-19 ENCOUNTER — Other Ambulatory Visit (HOSPITAL_COMMUNITY): Payer: Self-pay

## 2023-06-28 ENCOUNTER — Other Ambulatory Visit (HOSPITAL_COMMUNITY): Payer: Self-pay

## 2023-07-03 ENCOUNTER — Encounter: Payer: Self-pay | Admitting: Cardiology

## 2023-07-03 ENCOUNTER — Telehealth: Payer: Self-pay | Admitting: Cardiology

## 2023-07-03 MED ORDER — APIXABAN 5 MG PO TABS: 5.0000 mg | ORAL_TABLET | Freq: Two times a day (BID) | ORAL | 0 refills | Status: AC

## 2023-07-03 NOTE — Telephone Encounter (Signed)
 Patient calling the office for samples of medication:   1.  What medication and dosage are you requesting samples for? apixaban  (ELIQUIS ) 5 MG TABS tablet   2.  Are you currently out of this medication? Not yet, but running low

## 2023-07-03 NOTE — Telephone Encounter (Signed)
Eliquis  Samples °

## 2023-07-03 NOTE — Telephone Encounter (Signed)
 Eliquis  5 mg by mouth twice daily for 14 days is ready to pick up from front desk.

## 2023-07-05 ENCOUNTER — Other Ambulatory Visit (HOSPITAL_COMMUNITY): Payer: Self-pay

## 2023-07-05 ENCOUNTER — Other Ambulatory Visit: Payer: Self-pay

## 2023-07-15 ENCOUNTER — Other Ambulatory Visit: Payer: Self-pay | Admitting: Family Medicine

## 2023-07-15 ENCOUNTER — Other Ambulatory Visit (HOSPITAL_COMMUNITY): Payer: Self-pay

## 2023-07-16 ENCOUNTER — Other Ambulatory Visit (HOSPITAL_COMMUNITY): Payer: Self-pay

## 2023-07-16 MED ORDER — HYDROCODONE-ACETAMINOPHEN 10-325 MG PO TABS
1.0000 | ORAL_TABLET | Freq: Four times a day (QID) | ORAL | 0 refills | Status: DC | PRN
  Filled 2023-07-16 – 2023-07-17 (×2): qty 120, 30d supply, fill #0

## 2023-07-17 ENCOUNTER — Other Ambulatory Visit (HOSPITAL_COMMUNITY): Payer: Self-pay

## 2023-07-30 ENCOUNTER — Telehealth: Payer: Self-pay | Admitting: Pharmacy Technician

## 2023-07-30 ENCOUNTER — Encounter: Payer: Self-pay | Admitting: Cardiology

## 2023-07-30 NOTE — Telephone Encounter (Signed)
 Hi, can you mail this patient a eliquis  application tomorrow please? There are some in the drawer. He has my information to get back in touch with me and knows what he has to send back so no need for cover letter.

## 2023-07-30 NOTE — Telephone Encounter (Signed)
 I called the patient and he said he has enough eliquis  for a day. I told him we will mail him a [employer] application for him to fill out. Per other encounters:   Patient said then they said he couldn't do warfarin because caused bleeding?     04/16/23  4:47 PM Note Patient identification verified by 2 forms. Bertina Cooks, RN     Received call from patient  Informed patient:              -Insurance deductible is $250              -after deductible is met cost will be $47 for 1 month supply              -90 day supply at pharmacy is $326              -requesting samples from office is not most sustainable/reliable option  Patient states:              -he is on fixed income for social security              -does not know when he can come up with additional money for deductible              -needs samples to hold him over until he can come up with the money              -was considering taking Aspirin  instead  During call RN spoke to Pharmacist Medford Darrell Medford states:              -Patient can receive 2 week supply for Eliquis               -will have team run/check if other Rx are more affordable for patient  Informed Patient of plan as discussed with pharmacist  Patient states he will pick up samples at earliest convenience  Informed patient office will be in contact regarding alternative Rx  Patient agrees with plan, no questions at this time      Ran test claim for xarelto. For a 30 day supply and the co-pay is 232.06 Ran test claim for dabigatran. For a 30 day supply and the co-pay is 108.30

## 2023-07-31 NOTE — Telephone Encounter (Signed)
 [name] Woods to mail application from [address]

## 2023-07-31 NOTE — Telephone Encounter (Signed)
 He can have 5 mg samples of Eliquis 

## 2023-08-01 MED ORDER — APIXABAN 5 MG PO TABS: 5.0000 mg | ORAL_TABLET | Freq: Two times a day (BID) | ORAL | 0 refills | Status: AC

## 2023-08-01 NOTE — Telephone Encounter (Signed)
 Eliquis  5 mg by mouth twice daily for 2 weeks samples is ready for a a patient to pick up from 5 th floor, coumadin clinic front desk.

## 2023-08-09 NOTE — Telephone Encounter (Signed)
 Patient wife called back and they just got application yesterday and will work on it and let me know

## 2023-08-09 NOTE — Telephone Encounter (Signed)
 Lmom for patient to follow up to see if he got application that was mailed

## 2023-08-14 ENCOUNTER — Other Ambulatory Visit (HOSPITAL_COMMUNITY): Payer: Self-pay

## 2023-08-14 ENCOUNTER — Other Ambulatory Visit: Payer: Self-pay | Admitting: Family Medicine

## 2023-08-14 ENCOUNTER — Other Ambulatory Visit: Payer: Self-pay

## 2023-08-15 ENCOUNTER — Other Ambulatory Visit (HOSPITAL_COMMUNITY): Payer: Self-pay

## 2023-08-15 MED ORDER — HYDROCODONE-ACETAMINOPHEN 10-325 MG PO TABS
1.0000 | ORAL_TABLET | Freq: Four times a day (QID) | ORAL | 0 refills | Status: DC | PRN
  Filled 2023-08-15: qty 120, 30d supply, fill #0

## 2023-08-16 NOTE — Telephone Encounter (Signed)
 Sent mychart to [employer] on application

## 2023-08-19 ENCOUNTER — Other Ambulatory Visit (HOSPITAL_COMMUNITY): Payer: Self-pay

## 2023-08-23 NOTE — Telephone Encounter (Signed)
 Left another message to follow up on eliquis  application

## 2023-09-16 ENCOUNTER — Other Ambulatory Visit: Payer: Self-pay | Admitting: Family Medicine

## 2023-09-16 ENCOUNTER — Other Ambulatory Visit (HOSPITAL_COMMUNITY): Payer: Self-pay

## 2023-09-16 MED ORDER — HYDROCODONE-ACETAMINOPHEN 10-325 MG PO TABS
1.0000 | ORAL_TABLET | Freq: Four times a day (QID) | ORAL | 0 refills | Status: DC | PRN
  Filled 2023-09-16: qty 120, 30d supply, fill #0

## 2023-09-16 MED ORDER — CYCLOBENZAPRINE HCL 10 MG PO TABS
10.0000 mg | ORAL_TABLET | Freq: Three times a day (TID) | ORAL | 3 refills | Status: DC | PRN
  Filled 2023-09-16: qty 30, 10d supply, fill #0
  Filled 2023-10-15: qty 30, 10d supply, fill #1
  Filled 2023-11-14: qty 30, 10d supply, fill #2
  Filled 2023-12-16: qty 30, 10d supply, fill #3

## 2023-09-16 NOTE — Telephone Encounter (Signed)
 Last OV 05/30/23 Next OV 12/03/23  Last refill 08/15/23 Qty #120/0

## 2023-09-17 ENCOUNTER — Other Ambulatory Visit (HOSPITAL_COMMUNITY): Payer: Self-pay

## 2023-09-25 ENCOUNTER — Telehealth: Payer: Self-pay | Admitting: Cardiology

## 2023-09-25 NOTE — Telephone Encounter (Signed)
 Called pt to f/u request for Eliquis  samples.  Pt reports has not taken Eliquis  since Friday.   Asked if has completed application for pt assistance.  Pt reports has not followed up is waiting on rewards letters.  Advised pt to call in f/u on status of paperwork as its important that he continue to take Eliquis .  Mentioned warfarin use d/t cost concern.  Pt reports was previously on warfarin twice and had blood in urine and med was stopped.   Advised pt will send message to our pharmacy team to f/u.

## 2023-09-25 NOTE — Telephone Encounter (Signed)
 Patient calling the office for samples of medication:   1.  What medication and dosage are you requesting samples for?  apixaban  (ELIQUIS ) 5 MG TABS tablet   2.  Are you currently out of this medication?   Patient stated he is completely out of this medication.

## 2023-09-26 MED ORDER — APIXABAN 5 MG PO TABS
5.0000 mg | ORAL_TABLET | Freq: Two times a day (BID) | ORAL | Status: AC
Start: 2023-09-26 — End: ?

## 2023-09-26 NOTE — Telephone Encounter (Signed)
 Called pt advised left 4 weeks of samples at the Coumadin clinic to pick up. Pt reports misplaced Eliquis  pt assistance application.  Will get a new application.    Medication name/dosage: Samples List: Eliquis  5 mg  Administration instructions: 1 by mouth twice daily   Reason for samples: Reason for samples: unable to afford medication  Ordering provider: Lynwood Schilling  Samples prepared by Hamp, RN.  Message to be sent to med assist to offer assistance to pt.

## 2023-09-26 NOTE — Telephone Encounter (Signed)
 Okay to give 5 mg samples of Eliquis 

## 2023-09-26 NOTE — Telephone Encounter (Signed)
Patient is calling to follow up. Please advise.

## 2023-10-01 ENCOUNTER — Telehealth: Payer: Self-pay

## 2023-10-01 NOTE — Telephone Encounter (Signed)
 Staff onsite again 10/02/23 to mail application.

## 2023-10-02 ENCOUNTER — Other Ambulatory Visit (HOSPITAL_COMMUNITY): Payer: Self-pay

## 2023-10-02 NOTE — Telephone Encounter (Signed)
 PAP: Patient assistance application for Eliquis  through [employer] ([employer]) has been mailed to pt's home address on file. Provider portion of application will be faxed to provider's office when pt portion has been received.

## 2023-10-14 ENCOUNTER — Other Ambulatory Visit (HOSPITAL_COMMUNITY): Payer: Self-pay

## 2023-10-14 NOTE — Telephone Encounter (Signed)
 Left message for patient to call back

## 2023-10-14 NOTE — Telephone Encounter (Addendum)
 Patient Advocate Encounter PATIENT QUALIFIES FOR HW GRANT TO COVER ELIQUIS , DISREGARDING PAP APPLICATION. ENROLLED IN GRANT INSTEAD.    The patient was approved for a [employer] grant that will help cover the cost of ELIQUIS  Total amount awarded, $4,500.  Effective: 09/14/23 - 09/12/24   APW:[id number] ERW:[id number] Hmnle:[id number] PI:[id number]   Please send in prescription for Eliquis  including grant billing information in RX comment ([id number], ERW:[id number], Group: [id number], PI:[id number])    Ileana Lehmann, CPhT  Pharmacy Patient Advocate Specialist  Direct Number: [phone] Fax: [phone]

## 2023-10-15 ENCOUNTER — Other Ambulatory Visit: Payer: Self-pay | Admitting: Family Medicine

## 2023-10-15 ENCOUNTER — Other Ambulatory Visit (HOSPITAL_COMMUNITY): Payer: Self-pay

## 2023-10-15 MED ORDER — HYDROCODONE-ACETAMINOPHEN 10-325 MG PO TABS
1.0000 | ORAL_TABLET | Freq: Four times a day (QID) | ORAL | 0 refills | Status: DC | PRN
  Filled 2023-10-15 – 2023-10-17 (×2): qty 120, 30d supply, fill #0

## 2023-10-16 ENCOUNTER — Other Ambulatory Visit (HOSPITAL_COMMUNITY): Payer: Self-pay

## 2023-10-17 ENCOUNTER — Other Ambulatory Visit (HOSPITAL_COMMUNITY): Payer: Self-pay

## 2023-10-17 ENCOUNTER — Other Ambulatory Visit (HOSPITAL_BASED_OUTPATIENT_CLINIC_OR_DEPARTMENT_OTHER): Payer: Self-pay

## 2023-11-14 ENCOUNTER — Other Ambulatory Visit (HOSPITAL_COMMUNITY): Payer: Self-pay

## 2023-11-14 ENCOUNTER — Other Ambulatory Visit: Payer: Self-pay | Admitting: Family Medicine

## 2023-11-15 ENCOUNTER — Other Ambulatory Visit (HOSPITAL_COMMUNITY): Payer: Self-pay

## 2023-11-15 ENCOUNTER — Other Ambulatory Visit: Payer: Self-pay

## 2023-11-15 MED ORDER — HYDROCODONE-ACETAMINOPHEN 10-325 MG PO TABS
1.0000 | ORAL_TABLET | Freq: Four times a day (QID) | ORAL | 0 refills | Status: DC | PRN
  Filled 2023-11-15: qty 120, 30d supply, fill #0

## 2023-12-03 ENCOUNTER — Other Ambulatory Visit (HOSPITAL_COMMUNITY): Payer: Self-pay

## 2023-12-03 ENCOUNTER — Encounter: Payer: Self-pay | Admitting: Family Medicine

## 2023-12-03 ENCOUNTER — Ambulatory Visit: Admitting: Family Medicine

## 2023-12-03 VITALS — BP 138/82 | HR 40 | Temp 97.7°F | Ht 72.0 in | Wt 200.0 lb

## 2023-12-03 DIAGNOSIS — I10 Essential (primary) hypertension: Secondary | ICD-10-CM | POA: Diagnosis not present

## 2023-12-03 DIAGNOSIS — N529 Male erectile dysfunction, unspecified: Secondary | ICD-10-CM | POA: Diagnosis not present

## 2023-12-03 DIAGNOSIS — M1990 Unspecified osteoarthritis, unspecified site: Secondary | ICD-10-CM | POA: Diagnosis not present

## 2023-12-03 MED ORDER — TADALAFIL 5 MG PO TABS
2.5000 mg | ORAL_TABLET | Freq: Every day | ORAL | 11 refills | Status: AC
  Filled 2023-12-03 – 2023-12-18 (×2): qty 15, 30d supply, fill #0
  Filled 2024-01-16: qty 15, 30d supply, fill #1
  Filled 2024-02-17: qty 30, 60d supply, fill #2

## 2023-12-03 NOTE — Assessment & Plan Note (Signed)
 Blood pressure at goal today on current regimen per cardiology with metoprolol  succinate 100 mg twice daily, hydralazine  25 mg twice daily, and diltiazem  240 mg daily.

## 2023-12-03 NOTE — Progress Notes (Signed)
   [name] Woods is a 83 y.o. male who presents today for an office visit.  Assessment/Plan:  New/Acute Problems: Abdominal pain No red flags.  Does have previous history of diverticulosis and may have some ongoing issues with constipation.  Recommended increasing fiber intake.  He can also use simethicone as needed.  He will let us  know if not improving.  Chronic Problems Addressed Today: Essential hypertension Blood pressure at goal today on current regimen per cardiology with metoprolol  succinate 100 mg twice daily, hydralazine  25 mg twice daily, and diltiazem  240 mg daily.  Osteoarthritis Overall pain is stable.  He has Norco to use as needed.  Does not need refill on Norco today.  He has been on this intermittently for many years.  Medications do help with managing this pain.  Unfortunately his options for pain control are limited due to coronary artery disease and being anticoagulated on Eliquis .  Medication does help with pain management and ability to perform actives of daily living.  He will let us  know needs a refill.  Follow-up in 3 to 6 months.  Erectile dysfunction Stable on Cialis  2.5 mg daily.  Will refill today.     Subjective:  HPI:  See assessment / plan for status of chronic conditions.    Discussed the use of AI scribe software for clinical note transcription with the patient, who gave verbal consent to proceed.  History of Present Illness NIL [name] Woods is an 83 year old male who presents for a six-month follow-up visit.  He experiences ongoing pain in his legs, particularly at night, despite having had surgery on one knee. He attributes some of the pain to weather changes. He is currently taking Norco (hydrocodone ) for pain management, which was refilled a couple of weeks ago, and he does not require a refill at this time.  He inquires about his Cialis  dosage, currently taking 2.5 mg, and considers adjusting the dose to 5 mg as needed. He does not take  it daily.  He experiences intermittent pain on his side, more noticeable at night, occurring about once a week. He has a history of diverticulosis but has not increased his fiber intake. No significant issues with constipation, although his bowel habits are variable.         Objective:  Physical Exam: BP 138/82   Pulse (!) 40   Temp 97.7 F (36.5 C) (Temporal)   Ht 6' (1.829 m)   Wt 200 lb (90.7 kg)   SpO2 97%   BMI 27.12 kg/m   Gen: No acute distress, resting comfortably CV: Regular rate and rhythm with no murmurs appreciated Pulm: Normal work of breathing, clear to auscultation bilaterally with no crackles, wheezes, or rhonchi Neuro: Grossly normal, moves all extremities Psych: Normal affect and thought content      Harshini Trent M. Kennyth, MD 12/03/2023 1:16 PM

## 2023-12-03 NOTE — Patient Instructions (Signed)
 It was very nice to see you today!  VISIT SUMMARY: During your visit, we discussed your ongoing knee pain, intermittent abdominal pain, and erectile dysfunction. We also reviewed your current medications and vaccination status.  YOUR PLAN: OSTEOARTHRITIS OF KNEE: You have chronic knee pain, which we are currently managing. -Continue with your current pain management plan, including the use of Norco as needed.  ABDOMINAL PAIN: You have intermittent abdominal pain, likely related to diverticulosis. -Increase your dietary fiber intake. -Consider using simethicone for gas relief. -Monitor your symptoms and report if the pain persists or worsens.  MALE ERECTILE DYSFUNCTION: You are currently taking Cialis  2.5 mg and considering a dosage adjustment. -You can take Cialis  5 mg as needed, with the option to break the tablet in half.  Return in about 6 months (around 06/01/2024) for Follow Up, Annual Physical.   Take care, Dr Kennyth  PLEASE NOTE:  If you had any lab tests, please let us  know if you have not heard back within a few days. You may see your results on mychart before we have a chance to review them but we will give you a call once they are reviewed by us .   If we ordered any referrals today, please let us  know if you have not heard from their office within the next week.   If you had any urgent prescriptions sent in today, please check with the pharmacy within an hour of our visit to make sure the prescription was transmitted appropriately.   Please try these tips to maintain a healthy lifestyle:  Eat at least 3 REAL meals and 1-2 snacks per day.  Aim for no more than 5 hours between eating.  If you eat breakfast, please do so within one hour of getting up.   Each meal should contain half fruits/vegetables, one quarter protein, and one quarter carbs (no bigger than a computer mouse)  Cut down on sweet beverages. This includes juice, soda, and sweet tea.   Drink at least 1 glass  of water  with each meal and aim for at least 8 glasses per day  Exercise at least 150 minutes every week.

## 2023-12-03 NOTE — Assessment & Plan Note (Signed)
 Stable on Cialis  2.5 mg daily.  Will refill today.

## 2023-12-03 NOTE — Assessment & Plan Note (Signed)
 Overall pain is stable.  He has Norco to use as needed.  Does not need refill on Norco today.  He has been on this intermittently for many years.  Medications do help with managing this pain.  Unfortunately his options for pain control are limited due to coronary artery disease and being anticoagulated on Eliquis .  Medication does help with pain management and ability to perform actives of daily living.  He will let us  know needs a refill.  Follow-up in 3 to 6 months.

## 2023-12-05 ENCOUNTER — Other Ambulatory Visit (HOSPITAL_COMMUNITY): Payer: Self-pay

## 2023-12-13 ENCOUNTER — Other Ambulatory Visit (HOSPITAL_COMMUNITY): Payer: Self-pay

## 2023-12-16 ENCOUNTER — Other Ambulatory Visit (HOSPITAL_COMMUNITY): Payer: Self-pay

## 2023-12-16 ENCOUNTER — Other Ambulatory Visit: Payer: Self-pay | Admitting: Family Medicine

## 2023-12-16 MED ORDER — HYDROCODONE-ACETAMINOPHEN 10-325 MG PO TABS
1.0000 | ORAL_TABLET | Freq: Four times a day (QID) | ORAL | 0 refills | Status: DC | PRN
  Filled 2023-12-16: qty 120, 30d supply, fill #0

## 2023-12-18 ENCOUNTER — Other Ambulatory Visit (HOSPITAL_COMMUNITY): Payer: Self-pay

## 2023-12-25 ENCOUNTER — Other Ambulatory Visit: Payer: Self-pay | Admitting: Cardiology

## 2023-12-25 ENCOUNTER — Other Ambulatory Visit: Payer: Self-pay | Admitting: Family Medicine

## 2024-01-03 ENCOUNTER — Other Ambulatory Visit: Payer: Self-pay | Admitting: Cardiology

## 2024-01-15 ENCOUNTER — Other Ambulatory Visit: Payer: Self-pay | Admitting: Family Medicine

## 2024-01-15 ENCOUNTER — Other Ambulatory Visit (HOSPITAL_COMMUNITY): Payer: Self-pay

## 2024-01-16 ENCOUNTER — Other Ambulatory Visit (HOSPITAL_COMMUNITY): Payer: Self-pay

## 2024-01-16 MED ORDER — HYDROCODONE-ACETAMINOPHEN 10-325 MG PO TABS
1.0000 | ORAL_TABLET | Freq: Four times a day (QID) | ORAL | 0 refills | Status: DC | PRN
  Filled 2024-01-16: qty 120, 30d supply, fill #0

## 2024-02-06 ENCOUNTER — Other Ambulatory Visit: Payer: Self-pay | Admitting: Cardiology

## 2024-02-17 ENCOUNTER — Other Ambulatory Visit: Payer: Self-pay | Admitting: Family Medicine

## 2024-02-17 ENCOUNTER — Other Ambulatory Visit (HOSPITAL_COMMUNITY): Payer: Self-pay

## 2024-02-17 ENCOUNTER — Other Ambulatory Visit: Payer: Self-pay

## 2024-02-18 ENCOUNTER — Other Ambulatory Visit (HOSPITAL_COMMUNITY): Payer: Self-pay

## 2024-02-18 MED ORDER — CYCLOBENZAPRINE HCL 10 MG PO TABS
10.0000 mg | ORAL_TABLET | Freq: Three times a day (TID) | ORAL | 3 refills | Status: AC | PRN
  Filled 2024-02-18: qty 30, 10d supply, fill #0

## 2024-02-18 MED ORDER — HYDROCODONE-ACETAMINOPHEN 10-325 MG PO TABS
1.0000 | ORAL_TABLET | Freq: Four times a day (QID) | ORAL | 0 refills | Status: AC | PRN
  Filled 2024-02-18: qty 120, 30d supply, fill #0

## 2024-02-19 ENCOUNTER — Other Ambulatory Visit: Payer: Self-pay

## 2024-02-19 ENCOUNTER — Other Ambulatory Visit (HOSPITAL_COMMUNITY): Payer: Self-pay

## 2024-06-09 ENCOUNTER — Encounter: Admitting: Family Medicine
